# Patient Record
Sex: Female | Born: 1985
Health system: Southern US, Community
[De-identification: ages and names within clinical notes are randomized; demographics above are authoritative.]

## PROBLEM LIST (undated history)

## (undated) DIAGNOSIS — J45909 Unspecified asthma, uncomplicated: Secondary | ICD-10-CM

## (undated) DIAGNOSIS — R7303 Prediabetes: Secondary | ICD-10-CM

## (undated) DIAGNOSIS — F17200 Nicotine dependence, unspecified, uncomplicated: Secondary | ICD-10-CM

## (undated) DIAGNOSIS — M1990 Unspecified osteoarthritis, unspecified site: Secondary | ICD-10-CM

## (undated) DIAGNOSIS — I499 Cardiac arrhythmia, unspecified: Secondary | ICD-10-CM

## (undated) DIAGNOSIS — F32A Depression, unspecified: Secondary | ICD-10-CM

## (undated) DIAGNOSIS — J189 Pneumonia, unspecified organism: Secondary | ICD-10-CM

## (undated) DIAGNOSIS — R000 Tachycardia, unspecified: Secondary | ICD-10-CM

## (undated) DIAGNOSIS — M8700 Idiopathic aseptic necrosis of unspecified bone: Secondary | ICD-10-CM

## (undated) DIAGNOSIS — F329 Major depressive disorder, single episode, unspecified: Secondary | ICD-10-CM

## (undated) DIAGNOSIS — J40 Bronchitis, not specified as acute or chronic: Secondary | ICD-10-CM

## (undated) DIAGNOSIS — F419 Anxiety disorder, unspecified: Secondary | ICD-10-CM

## (undated) HISTORY — PX: OVARIAN CYST SURGERY: SHX726

## (undated) HISTORY — PX: ABDOMINAL HYSTERECTOMY: SHX81

---
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
## 2006-05-26 HISTORY — PX: TOOTH EXTRACTION: SUR596

## 2012-05-26 DIAGNOSIS — J189 Pneumonia, unspecified organism: Secondary | ICD-10-CM

## 2012-05-26 HISTORY — DX: Pneumonia, unspecified organism: J18.9

## 2013-07-02 ENCOUNTER — Emergency Department (HOSPITAL_BASED_OUTPATIENT_CLINIC_OR_DEPARTMENT_OTHER)
Admission: EM | Admit: 2013-07-02 | Discharge: 2013-07-02 | Disposition: A | Discharge status: Home / Self Care | Payer: 59 | Attending: Emergency Medicine | Admitting: Emergency Medicine

## 2013-07-02 ENCOUNTER — Encounter (HOSPITAL_BASED_OUTPATIENT_CLINIC_OR_DEPARTMENT_OTHER): Payer: Self-pay | Admitting: Emergency Medicine

## 2013-07-02 DIAGNOSIS — F17200 Nicotine dependence, unspecified, uncomplicated: Secondary | ICD-10-CM | POA: Insufficient documentation

## 2013-07-02 DIAGNOSIS — R21 Rash and other nonspecific skin eruption: Secondary | ICD-10-CM | POA: Insufficient documentation

## 2013-07-02 DIAGNOSIS — T7840XA Allergy, unspecified, initial encounter: Secondary | ICD-10-CM

## 2013-07-02 DIAGNOSIS — J45909 Unspecified asthma, uncomplicated: Secondary | ICD-10-CM | POA: Insufficient documentation

## 2013-07-02 HISTORY — DX: Bronchitis, not specified as acute or chronic: J40

## 2013-07-02 HISTORY — DX: Unspecified asthma, uncomplicated: J45.909

## 2013-07-02 MED ORDER — FAMOTIDINE IN NACL 20-0.9 MG/50ML-% IV SOLN
20.0000 mg | Freq: Once | INTRAVENOUS | Status: AC
  Administered 2013-07-02: 20 mg via INTRAVENOUS
  Filled 2013-07-02: qty 50

## 2013-07-02 MED ORDER — HYDROXYZINE HCL 25 MG PO TABS: 25.0000 mg | ORAL_TABLET | Freq: Four times a day (QID) | ORAL | Status: DC

## 2013-07-02 MED ORDER — METHYLPREDNISOLONE SODIUM SUCC 125 MG IJ SOLR
125.0000 mg | Freq: Once | INTRAMUSCULAR | Status: AC
  Administered 2013-07-02: 125 mg via INTRAVENOUS
  Filled 2013-07-02: qty 2

## 2013-07-02 MED ORDER — PREDNISONE 20 MG PO TABS: 40.0000 mg | ORAL_TABLET | Freq: Every day | ORAL | Status: DC

## 2013-07-02 MED ORDER — SODIUM CHLORIDE 0.9 % IV BOLUS (SEPSIS)
1000.0000 mL | Freq: Once | INTRAVENOUS | Status: AC
  Administered 2013-07-02: 1000 mL via INTRAVENOUS

## 2013-07-02 MED ORDER — DIPHENHYDRAMINE HCL 50 MG/ML IJ SOLN
25.0000 mg | Freq: Once | INTRAMUSCULAR | Status: AC
  Administered 2013-07-02: 25 mg via INTRAVENOUS
  Filled 2013-07-02: qty 1

## 2013-07-02 NOTE — Discharge Instructions (Signed)
Take prednisone as needed for worsening symptoms. Take Atarax as needed for itching. Refer to attached documents for more information. Return to the ED with worsening or concerning symptoms.

## 2013-07-02 NOTE — ED Notes (Signed)
Pt reports itching "everywhere" x 1-2 hours. Sudden onset. Drank a red powerade prior to sx starting. Itching and bumps noted to arms- denies throat involvement

## 2013-07-02 NOTE — ED Provider Notes (Signed)
CSN: [id number]     Arrival date & time 07/02/13  1858 History   First MD Initiated Contact with Patient 07/02/13 1944     Chief Complaint  Patient presents with  . Allergic Reaction   (Consider location/radiation/quality/duration/timing/severity/associated sxs/prior Treatment) HPI Comments: Patient is a 28 year old female who presents with a rash that started 1 hour prior to arrival. The rash started gradually and progressively worsened since the onset. The rash is located on bilateral arms and chest. Patient has tried nothing without relief. Patient denies new exposures to medications, soaps, lotions, detergent. Patient reports associated occasional itching. No aggravating/alleviating factors. Patient denies fever, chills, NVD, sore throat, oral lesions, ocular involvement, throat closing, wheezing, SOB, chest pain, abdominal pain.      Past Medical History  Diagnosis Date  . Asthma   . Bronchitis    History reviewed. No pertinent past surgical history. No family history on file. History  Substance Use Topics  . Smoking status: Current Every Day Smoker -- 0.50 packs/day    Types: Cigarettes  . Smokeless tobacco: Never Used  . Alcohol Use: 1.8 oz/week    3 Cans of beer per week   OB History   Grav Para Term Preterm Abortions TAB SAB Ect Mult Living                 Review of Systems  Constitutional: Negative for fever, chills and fatigue.  HENT: Negative for trouble swallowing.   Eyes: Negative for visual disturbance.  Respiratory: Negative for shortness of breath.   Cardiovascular: Negative for chest pain and palpitations.  Gastrointestinal: Negative for nausea, vomiting, abdominal pain and diarrhea.  Genitourinary: Negative for dysuria and difficulty urinating.  Musculoskeletal: Negative for arthralgias and neck pain.  Skin: Positive for rash. Negative for color change.  Neurological: Negative for dizziness and weakness.  Psychiatric/Behavioral: Negative for dysphoric  mood.    Allergies  Oxycodone  Home Medications  No current outpatient prescriptions on file. BP 115/70  Pulse 102  Temp(Src) 98.8 F (37.1 C) (Oral)  Resp 18  Ht 5\' 4"  (1.626 m)  Wt 109 lb (49.442 kg)  BMI 18.70 kg/m2  SpO2 100% Physical Exam  Nursing note and vitals reviewed. Constitutional: She is oriented to person, place, and time. She appears well-developed and well-nourished. No distress.  HENT:  Head: Normocephalic and atraumatic.  Eyes: Conjunctivae and EOM are normal.  Neck: Normal range of motion.  Cardiovascular: Normal rate and regular rhythm.  Exam reveals no gallop and no friction rub.   No murmur heard. Pulmonary/Chest: Effort normal and breath sounds normal. She has no wheezes. She has no rales. She exhibits no tenderness.  Musculoskeletal: Normal range of motion.  Neurological: She is alert and oriented to person, place, and time. Coordination normal.  Speech is goal-oriented. Moves limbs without ataxia.   Skin: Skin is warm and dry.  No rash noted on bilateral arms and chest where patient states she is itching.   Psychiatric: She has a normal mood and affect. Her behavior is normal.    ED Course  Procedures (including critical care time) Labs Review Labs Reviewed - No data to display Imaging Review No results found.  EKG Interpretation   None       MDM   1. Allergic reaction     7:51 PM Patient will have solumedrol, pepcid and benadryl for allergic reaction. Patient slightly tachycardic with remaining vitals stable.   9:32 PM Patient reports improvement in her symptoms. I don't see  a rash. Patient is requesting prednisone in case the rash worsens. Patient will also have a prescription for atarax for itching. Patient advised to return to the ED with worsening or concerning symptoms.    Emilia Beck, PA-C 07/02/13 2135

## 2013-07-03 NOTE — ED Provider Notes (Signed)
Medical screening examination/treatment/procedure(s) were performed by non-physician practitioner and as supervising physician I was immediately available for consultation/collaboration.  EKG Interpretation   None         Junius ArgyleForrest S Taeshawn Helfman, MD 07/03/13 1154

## 2013-07-04 ENCOUNTER — Emergency Department (HOSPITAL_BASED_OUTPATIENT_CLINIC_OR_DEPARTMENT_OTHER)
Admission: EM | Admit: 2013-07-04 | Discharge: 2013-07-04 | Disposition: A | Discharge status: Home / Self Care | Payer: 59 | Attending: Emergency Medicine | Admitting: Emergency Medicine

## 2013-07-04 ENCOUNTER — Encounter (HOSPITAL_BASED_OUTPATIENT_CLINIC_OR_DEPARTMENT_OTHER): Payer: Self-pay | Admitting: Emergency Medicine

## 2013-07-04 DIAGNOSIS — F17200 Nicotine dependence, unspecified, uncomplicated: Secondary | ICD-10-CM | POA: Insufficient documentation

## 2013-07-04 DIAGNOSIS — R21 Rash and other nonspecific skin eruption: Secondary | ICD-10-CM | POA: Insufficient documentation

## 2013-07-04 DIAGNOSIS — J45909 Unspecified asthma, uncomplicated: Secondary | ICD-10-CM | POA: Insufficient documentation

## 2013-07-04 DIAGNOSIS — IMO0002 Reserved for concepts with insufficient information to code with codable children: Secondary | ICD-10-CM | POA: Insufficient documentation

## 2013-07-04 DIAGNOSIS — Z3202 Encounter for pregnancy test, result negative: Secondary | ICD-10-CM | POA: Insufficient documentation

## 2013-07-04 LAB — PREGNANCY, URINE: PREG TEST UR: NEGATIVE

## 2013-07-04 MED ORDER — FAMOTIDINE 20 MG PO TABS: 20.0000 mg | ORAL_TABLET | Freq: Two times a day (BID) | ORAL | Status: DC

## 2013-07-04 NOTE — ED Provider Notes (Signed)
CSN: [id number]     Arrival date & time 07/04/13  1358 History   First MD Initiated Contact with Patient 07/04/13 1439     Chief Complaint  Patient presents with  . Rash     (Consider location/radiation/quality/duration/timing/severity/associated sxs/prior Treatment) Patient is a 28 y.o. female presenting with rash. The history is provided by the patient.  Rash Location:  Shoulder/arm and torso Shoulder/arm rash location:  L arm and R arm Torso rash location:  R chest and L chest Associated symptoms: no fever   Associated symptoms comment:  Rash to arms and chest for the past several days. No lip or tongue swelling, or difficulty breathing. She was seen 2 days ago in ED and given prednisone that she is taking, but with no relief.    Past Medical History  Diagnosis Date  . Asthma   . Bronchitis    History reviewed. No pertinent past surgical history. No family history on file. History  Substance Use Topics  . Smoking status: Current Every Day Smoker -- 0.50 packs/day    Types: Cigarettes  . Smokeless tobacco: Never Used  . Alcohol Use: 1.8 oz/week    3 Cans of beer per week   OB History   Grav Para Term Preterm Abortions TAB SAB Ect Mult Living                 Review of Systems  Constitutional: Negative for fever and chills.  HENT: Negative for trouble swallowing.   Respiratory: Negative.   Skin: Positive for rash.  Neurological: Negative.       Allergies  Oxycodone  Home Medications   Current Outpatient Rx  Name  Route  Sig  Dispense  Refill  . hydrOXYzine (ATARAX/VISTARIL) 25 MG tablet   Oral   Take 1 tablet (25 mg total) by mouth every 6 (six) hours.   12 tablet   0   . predniSONE (DELTASONE) 20 MG tablet   Oral   Take 2 tablets (40 mg total) by mouth daily. Take 40 mg by mouth daily for 3 days, then 20mg  by mouth daily for 3 days, then 10mg  daily for 3 days   12 tablet   0    BP 128/77  Pulse 94  Temp(Src) 98.4 F (36.9 C) (Oral)  Resp 16   SpO2 100% Physical Exam  Constitutional: She is oriented to person, place, and time. She appears well-developed and well-nourished.  HENT:  Head: Normocephalic.  Mouth/Throat: Oropharynx is clear and moist.  Neck: Normal range of motion. Neck supple.  Cardiovascular: Normal rate and regular rhythm.   Pulmonary/Chest: Effort normal and breath sounds normal. She has no wheezes. She has no rales.  Abdominal: Soft. Bowel sounds are normal. There is no tenderness. There is no rebound and no guarding.  Musculoskeletal: Normal range of motion.  Neurological: She is alert and oriented to person, place, and time.  Skin: Skin is warm and dry.  Hypopigmented, non-raised rash to forearms. No visualized rash to chest. No extremity swelling or tenderness.   Psychiatric: She has a normal mood and affect.    ED Course  Procedures (including critical care time) Labs Review Labs Reviewed  PREGNANCY, URINE   Imaging Review No results found.  EKG Interpretation   None       MDM   Final diagnoses:  None    1. Rash  ED evaluation x 2 for nonspecific rash not responding to prednisone after 2 days of therapy. Will refer to dermatology for  further management.     Arnoldo HookerShari A Erricka Falkner, PA-C 07/05/13 1214

## 2013-07-04 NOTE — Discharge Instructions (Signed)
CONTINUE YOUR CURRENT MEDICATIONS AND ADD PEPCID. FOLLOW UP WITH DERMATOLOGY FOR FURTHER EVALUATION AND TREATMENT.   Rash A rash is a change in the color or texture of your skin. There are many different types of rashes. You may have other problems that accompany your rash. CAUSES   Infections.  Allergic reactions. This can include allergies to pets or foods.  Certain medicines.  Exposure to certain chemicals, soaps, or cosmetics.  Heat.  Exposure to poisonous plants.  Tumors, both cancerous and noncancerous. SYMPTOMS   Redness.  Scaly skin.  Itchy skin.  Dry or cracked skin.  Bumps.  Blisters.  Pain. DIAGNOSIS  Your caregiver may do a physical exam to determine what type of rash you have. A skin sample (biopsy) may be taken and examined under a microscope. TREATMENT  Treatment depends on the type of rash you have. Your caregiver may prescribe certain medicines. For serious conditions, you may need to see a skin doctor (dermatologist). HOME CARE INSTRUCTIONS   Avoid the substance that caused your rash.  Do not scratch your rash. This can cause infection.  You may take cool baths to help stop itching.  Only take over-the-counter or prescription medicines as directed by your caregiver.  Keep all follow-up appointments as directed by your caregiver. SEEK IMMEDIATE MEDICAL CARE IF:  You have increasing pain, swelling, or redness.  You have a fever.  You have new or severe symptoms.  You have body aches, diarrhea, or vomiting.  Your rash is not better after 3 days. MAKE SURE YOU:  Understand these instructions.  Will watch your condition.  Will get help right away if you are not doing well or get worse. Document Released: 05/02/2002 Document Revised: 08/04/2011 Document Reviewed: 02/24/2011 [facility] Patient Information 2014 [employer]ExitCare, Maryland[employer]C.

## 2013-07-04 NOTE — ED Notes (Signed)
Pt c/o rash, redness and itching to "all over from torso and up". Pt sts she was seen here 2 days ago for same and is taking meds for same.

## 2013-07-05 NOTE — ED Provider Notes (Signed)
Medical screening examination/treatment/procedure(s) were performed by non-physician practitioner and as supervising physician I was immediately available for consultation/collaboration.  EKG Interpretation   None         Angelia Hazell, MD 07/05/13 1516 

## 2013-07-08 ENCOUNTER — Emergency Department (HOSPITAL_BASED_OUTPATIENT_CLINIC_OR_DEPARTMENT_OTHER): Payer: 59

## 2013-07-08 ENCOUNTER — Emergency Department (HOSPITAL_BASED_OUTPATIENT_CLINIC_OR_DEPARTMENT_OTHER)
Admission: EM | Admit: 2013-07-08 | Discharge: 2013-07-08 | Disposition: A | Discharge status: Home / Self Care | Payer: 59 | Attending: Emergency Medicine | Admitting: Emergency Medicine

## 2013-07-08 ENCOUNTER — Encounter (HOSPITAL_BASED_OUTPATIENT_CLINIC_OR_DEPARTMENT_OTHER): Payer: Self-pay | Admitting: Emergency Medicine

## 2013-07-08 DIAGNOSIS — Z3202 Encounter for pregnancy test, result negative: Secondary | ICD-10-CM | POA: Insufficient documentation

## 2013-07-08 DIAGNOSIS — J45909 Unspecified asthma, uncomplicated: Secondary | ICD-10-CM | POA: Insufficient documentation

## 2013-07-08 DIAGNOSIS — R109 Unspecified abdominal pain: Secondary | ICD-10-CM

## 2013-07-08 DIAGNOSIS — F17200 Nicotine dependence, unspecified, uncomplicated: Secondary | ICD-10-CM | POA: Insufficient documentation

## 2013-07-08 DIAGNOSIS — R1084 Generalized abdominal pain: Secondary | ICD-10-CM | POA: Insufficient documentation

## 2013-07-08 DIAGNOSIS — I998 Other disorder of circulatory system: Secondary | ICD-10-CM | POA: Insufficient documentation

## 2013-07-08 DIAGNOSIS — Z79899 Other long term (current) drug therapy: Secondary | ICD-10-CM | POA: Insufficient documentation

## 2013-07-08 LAB — CBC WITH DIFFERENTIAL/PLATELET
Basophils Absolute: 0 10*3/uL (ref 0.0–0.1)
Basophils Relative: 0 % (ref 0–1)
EOS ABS: 0 10*3/uL (ref 0.0–0.7)
Eosinophils Relative: 0 % (ref 0–5)
HCT: 42.7 % (ref 36.0–46.0)
HEMOGLOBIN: 14.5 g/dL (ref 12.0–15.0)
LYMPHS ABS: 1 10*3/uL (ref 0.7–4.0)
Lymphocytes Relative: 6 % — ABNORMAL LOW (ref 12–46)
MCH: 32.3 pg (ref 26.0–34.0)
MCHC: 34 g/dL (ref 30.0–36.0)
MCV: 95.1 fL (ref 78.0–100.0)
MONOS PCT: 2 % — AB (ref 3–12)
Monocytes Absolute: 0.4 10*3/uL (ref 0.1–1.0)
Neutro Abs: 14.7 10*3/uL — ABNORMAL HIGH (ref 1.7–7.7)
Neutrophils Relative %: 91 % — ABNORMAL HIGH (ref 43–77)
Platelets: 227 10*3/uL (ref 150–400)
RBC: 4.49 MIL/uL (ref 3.87–5.11)
RDW: 13.9 % (ref 11.5–15.5)
WBC: 16.1 10*3/uL — ABNORMAL HIGH (ref 4.0–10.5)

## 2013-07-08 LAB — URINALYSIS, ROUTINE W REFLEX MICROSCOPIC
Bilirubin Urine: NEGATIVE
Glucose, UA: NEGATIVE mg/dL
Hgb urine dipstick: NEGATIVE
KETONES UR: NEGATIVE mg/dL
Leukocytes, UA: NEGATIVE
NITRITE: NEGATIVE
PH: 6.5 (ref 5.0–8.0)
Protein, ur: NEGATIVE mg/dL
Specific Gravity, Urine: 1.022 (ref 1.005–1.030)
Urobilinogen, UA: 0.2 mg/dL (ref 0.0–1.0)

## 2013-07-08 LAB — COMPREHENSIVE METABOLIC PANEL
ALT: 13 U/L (ref 0–35)
AST: 15 U/L (ref 0–37)
Albumin: 3.9 g/dL (ref 3.5–5.2)
Alkaline Phosphatase: 63 U/L (ref 39–117)
BUN: 16 mg/dL (ref 6–23)
CALCIUM: 9.3 mg/dL (ref 8.4–10.5)
CO2: 25 mEq/L (ref 19–32)
Chloride: 103 mEq/L (ref 96–112)
Creatinine, Ser: 0.7 mg/dL (ref 0.50–1.10)
GFR calc Af Amer: >90 mL/min (ref >90)
GFR calc non Af Amer: >90 mL/min (ref >90)
GLUCOSE: 120 mg/dL — AB (ref 70–99)
Potassium: 4.3 mEq/L (ref 3.7–5.3)
Sodium: 141 mEq/L (ref 137–147)
TOTAL PROTEIN: 7.1 g/dL (ref 6.0–8.3)
Total Bilirubin: <0.2 mg/dL — ABNORMAL LOW (ref 0.3–1.2)

## 2013-07-08 LAB — PREGNANCY, URINE: Preg Test, Ur: NEGATIVE

## 2013-07-08 LAB — LIPASE, BLOOD: LIPASE: 50 U/L (ref 11–59)

## 2013-07-08 MED ORDER — HYDROCODONE-ACETAMINOPHEN 7.5-325 MG/15ML PO SOLN
10.0000 mL | Freq: Once | ORAL | Status: AC
  Administered 2013-07-08: 10 mL via ORAL
  Filled 2013-07-08: qty 15

## 2013-07-08 MED ORDER — FENTANYL CITRATE 0.05 MG/ML IJ SOLN
50.0000 ug | Freq: Once | INTRAMUSCULAR | Status: AC
  Administered 2013-07-08: 50 ug via INTRAVENOUS
  Filled 2013-07-08: qty 2

## 2013-07-08 MED ORDER — IOHEXOL 300 MG/ML  SOLN
50.0000 mL | Freq: Once | INTRAMUSCULAR | Status: AC | PRN
  Administered 2013-07-08: 50 mL via ORAL

## 2013-07-08 MED ORDER — HYDROCODONE-ACETAMINOPHEN 7.5-325 MG/15ML PO SOLN: 15.0000 mL | Freq: Four times a day (QID) | ORAL | Status: DC | PRN

## 2013-07-08 MED ORDER — IOHEXOL 300 MG/ML  SOLN
100.0000 mL | Freq: Once | INTRAMUSCULAR | Status: AC | PRN
  Administered 2013-07-08: 100 mL via INTRAVENOUS

## 2013-07-08 NOTE — ED Provider Notes (Signed)
CSN: [id number]     Arrival date & time 07/08/13  1226 History   First MD Initiated Contact with Patient 07/08/13 1233     Chief Complaint  Patient presents with  . Abdominal Pain     (Consider location/radiation/quality/duration/timing/severity/associated sxs/prior Treatment) Patient is a 28 y.o. female presenting with abdominal pain. The history is provided by the patient. No language interpreter was used.  Abdominal Pain Pain location:  Generalized Pain quality: aching and sharp   Pain radiates to:  Does not radiate Pain severity:  Moderate Onset quality:  Gradual Duration:  1 week Timing:  Constant Progression:  Worsening Chronicity:  New Context: not diet changes, not medication withdrawal, not previous surgeries, not recent illness, not retching, not sick contacts, not suspicious food intake and not trauma   Relieved by:  Nothing Worsened by:  Nothing tried Ineffective treatments:  None tried Associated symptoms: no anorexia, no chest pain, no chills, no constipation, no cough, no diarrhea, no dysuria, no fatigue, no fever, no hematemesis, no hematochezia, no hematuria, no melena, no nausea, no shortness of breath, no sore throat, no vaginal bleeding, no vaginal discharge and no vomiting   Risk factors: no alcohol abuse, no aspirin use, has not had multiple surgeries, no NSAID use, not obese, not pregnant and no recent hospitalization     Past Medical History  Diagnosis Date  . Asthma   . Bronchitis    History reviewed. No pertinent past surgical history. No family history on file. History  Substance Use Topics  . Smoking status: Current Every Day Smoker -- 0.50 packs/day    Types: Cigarettes  . Smokeless tobacco: Never Used  . Alcohol Use: 1.8 oz/week    3 Cans of beer per week   OB History   Grav Para Term Preterm Abortions TAB SAB Ect Mult Living                 Review of Systems  Constitutional: Negative for fever, chills, diaphoresis, activity change,  appetite change and fatigue.  HENT: Negative for congestion, facial swelling, rhinorrhea and sore throat.   Eyes: Negative for photophobia and discharge.  Respiratory: Negative for cough, chest tightness and shortness of breath.   Cardiovascular: Negative for chest pain, palpitations and leg swelling.  Gastrointestinal: Positive for abdominal pain. Negative for nausea, vomiting, diarrhea, constipation, melena, hematochezia, anorexia and hematemesis.  Endocrine: Negative for polydipsia and polyuria.  Genitourinary: Negative for dysuria, frequency, hematuria, vaginal bleeding, vaginal discharge, difficulty urinating and pelvic pain.  Musculoskeletal: Negative for arthralgias, back pain, neck pain and neck stiffness.  Skin: Negative for color change and wound.  Allergic/Immunologic: Negative for immunocompromised state.  Neurological: Negative for facial asymmetry, weakness, numbness and headaches.  Hematological: Does not bruise/bleed easily.  Psychiatric/Behavioral: Negative for confusion and agitation.      Allergies  Oxycodone  Home Medications   Current Outpatient Rx  Name  Route  Sig  Dispense  Refill  . famotidine (PEPCID) 20 MG tablet   Oral   Take 1 tablet (20 mg total) by mouth 2 (two) times daily.   30 tablet   0   . HYDROcodone-acetaminophen (HYCET) 7.5-325 mg/15 ml solution   Oral   Take 15 mLs by mouth 4 (four) times daily as needed for moderate pain.   150 mL   0   . hydrOXYzine (ATARAX/VISTARIL) 25 MG tablet   Oral   Take 1 tablet (25 mg total) by mouth every 6 (six) hours.   12 tablet  0   . predniSONE (DELTASONE) 20 MG tablet   Oral   Take 2 tablets (40 mg total) by mouth daily. Take 40 mg by mouth daily for 3 days, then 20mg  by mouth daily for 3 days, then 10mg  daily for 3 days   12 tablet   0    BP 121/76  Pulse 77  Temp(Src) 98.5 F (36.9 C) (Oral)  Resp 16  Ht 5' 3.75" (1.619 m)  Wt 109 lb 4 oz (49.555 kg)  BMI 18.91 kg/m2  SpO2 98%  LMP  06/26/2013 Physical Exam  Constitutional: She is oriented to person, place, and time. She appears well-developed and well-nourished. No distress.  HENT:  Head: Normocephalic and atraumatic.  Mouth/Throat: No oropharyngeal exudate.  Eyes: Pupils are equal, round, and reactive to light.  Neck: Normal range of motion. Neck supple.  Cardiovascular: Normal rate, regular rhythm and normal heart sounds.  Exam reveals no gallop and no friction rub.   No murmur heard. Pulmonary/Chest: Effort normal and breath sounds normal. No respiratory distress. She has no wheezes. She has no rales.  Abdominal: Soft. Bowel sounds are normal. She exhibits no distension and no mass. There is no tenderness. There is no rebound and no guarding.  Musculoskeletal: Normal range of motion. She exhibits no edema and no tenderness.  Several small, light colored ecchymosis to BLLE  Neurological: She is alert and oriented to person, place, and time.  Skin: Skin is warm and dry.  Psychiatric: She has a normal mood and affect.    ED Course  Procedures (including critical care time) Labs Review Labs Reviewed  URINALYSIS, ROUTINE W REFLEX MICROSCOPIC - Abnormal; Notable for the following:    APPearance CLOUDY (*)    All other components within normal limits  CBC WITH DIFFERENTIAL - Abnormal; Notable for the following:    WBC 16.1 (*)    Neutrophils Relative % 91 (*)    Neutro Abs 14.7 (*)    Lymphocytes Relative 6 (*)    Monocytes Relative 2 (*)    All other components within normal limits  COMPREHENSIVE METABOLIC PANEL - Abnormal; Notable for the following:    Glucose, Bld 120 (*)    Total Bilirubin <0.2 (*)    All other components within normal limits  PREGNANCY, URINE  LIPASE, BLOOD   Imaging Review Ct Abdomen Pelvis W Contrast  07/08/2013   CLINICAL DATA:  Abdominal pain.  EXAM: CT ABDOMEN AND PELVIS WITH CONTRAST  TECHNIQUE: Multidetector CT imaging of the abdomen and pelvis was performed using the standard  protocol following bolus administration of intravenous contrast.  CONTRAST:  50mL OMNIPAQUE IOHEXOL 300 MG/ML SOLN, OMNIPAQUE IOHEXOL 300 MG/ML SOLN  COMPARISON:  None.  FINDINGS: Visualized lung bases appear normal. No osseous abnormality is noted.  The liver, spleen and pancreas appear normal. No gallstones are noted. Adrenal glands appear normal. No hydronephrosis or renal obstruction is noted. No renal or ureteral calculi are noted. The appendix appears normal. There is no evidence of bowel obstruction. Urinary bladder appears normal. Uterus appears normal. No abnormal fluid collection is noted. 2.8 cm left ovarian cyst is noted. 2.2 cm right ovarian cyst is noted. No definite adenopathy is noted.  IMPRESSION: Bilateral ovarian cysts are noted which most likely are physiologic. No other significant abnormality seen in the abdomen or pelvis.   Electronically Signed   By: Roque Lias M.D.   On: 07/08/2013 17:26    EKG Interpretation   None  MDM   Final diagnoses:  Abdominal pain    Pt is a 28 y.o. female with Pmhx as above who presents with about 1 week of pain that started in mid back, now also with generalized abdominal pain. No fever, chills, n/v, d/a, urinary symptoms, vaginal bleeding or d/c.  She reports inc brusing in LE.  She also reports this is 3rd visit for same, but it appears other two visits were for a rash which has since resolved.  She has a brother who developed atypical HUS about 1 yr ago and developed ESRD. On PE, VSS, pt in NAD.  +generalized ttp of mid to low back and upper abdomen w/o rebound or guarding.  She has several small, light bruises to BLLE.  No other rash including, petechia or purpura.  W/u showed elevated WBC w/ left shift. Hb, Cr, lytes nml.  Doubt HUS.  Pt not improved after PO vidocin.  Given high WBC, CP ab/pelvis ordered.  Care transferred to Dr. Loretha Stapler w/ plan to review scan.   Shanna Cisco, MD 07/08/13 2211

## 2013-07-08 NOTE — ED Provider Notes (Signed)
Assumed from Dr. Park Popeockerty.  CT scan has resulted and is negative. Repeat abdominal exam shows tenderness in epigastrium without other tenderness and without rebound rigidity or guarding.  Source of patient's pain is unclear, so have advised close followup and have given strict return precautions.   Clinical Impression: 1. Abdominal pain       Diane ChurnJohn David Thressa Shiffer III, MD 07/08/13 [phone]

## 2013-07-08 NOTE — Discharge Instructions (Signed)
Abdominal Pain, Women °Abdominal (stomach, pelvic, or belly) pain can be caused by many things. It is important to tell your doctor: °· The location of the pain. °· Does it come and go or is it present all the time? °· Are there things that start the pain (eating certain foods, exercise)? °· Are there other symptoms associated with the pain (fever, nausea, vomiting, diarrhea)? °All of this is helpful to know when trying to find the cause of the pain. °CAUSES  °· Stomach: virus or bacteria infection, or ulcer. °· Intestine: appendicitis (inflamed appendix), regional ileitis (Crohn's disease), ulcerative colitis (inflamed colon), irritable bowel syndrome, diverticulitis (inflamed diverticulum of the colon), or cancer of the stomach or intestine. °· Gallbladder disease or stones in the gallbladder. °· Kidney disease, kidney stones, or infection. °· Pancreas infection or cancer. °· Fibromyalgia (pain disorder). °· Diseases of the female organs: °· Uterus: fibroid (non-cancerous) tumors or infection. °· Fallopian tubes: infection or tubal pregnancy. °· Ovary: cysts or tumors. °· Pelvic adhesions (scar tissue). °· Endometriosis (uterus lining tissue growing in the pelvis and on the pelvic organs). °· Pelvic congestion syndrome (female organs filling up with blood just before the menstrual period). °· Pain with the menstrual period. °· Pain with ovulation (producing an egg). °· Pain with an IUD (intrauterine device, birth control) in the uterus. °· Cancer of the female organs. °· Functional pain (pain not caused by a disease, may improve without treatment). °· Psychological pain. °· Depression. °DIAGNOSIS  °Your doctor will decide the seriousness of your pain by doing an examination. °· Blood tests. °· X-rays. °· Ultrasound. °· CT scan (computed tomography, special type of X-ray). °· MRI (magnetic resonance imaging). °· Cultures, for infection. °· Barium enema (dye inserted in the large intestine, to better view it with  X-rays). °· Colonoscopy (looking in intestine with a lighted tube). °· Laparoscopy (minor surgery, looking in abdomen with a lighted tube). °· Major abdominal exploratory surgery (looking in abdomen with a large incision). °TREATMENT  °The treatment will depend on the cause of the pain.  °· Many cases can be observed and treated at home. °· Over-the-counter medicines recommended by your caregiver. °· Prescription medicine. °· Antibiotics, for infection. °· Birth control pills, for painful periods or for ovulation pain. °· Hormone treatment, for endometriosis. °· Nerve blocking injections. °· Physical therapy. °· Antidepressants. °· Counseling with a psychologist or psychiatrist. °· Minor or major surgery. °HOME CARE INSTRUCTIONS  °· Do not take laxatives, unless directed by your caregiver. °· Take over-the-counter pain medicine only if ordered by your caregiver. Do not take aspirin because it can cause an upset stomach or bleeding. °· Try a clear liquid diet (broth or water) as ordered by your caregiver. Slowly move to a bland diet, as tolerated, if the pain is related to the stomach or intestine. °· Have a thermometer and take your temperature several times a day, and record it. °· Bed rest and sleep, if it helps the pain. °· Avoid sexual intercourse, if it causes pain. °· Avoid stressful situations. °· Keep your follow-up appointments and tests, as your caregiver orders. °· If the pain does not go away with medicine or surgery, you may try: °· Acupuncture. °· Relaxation exercises (yoga, meditation). °· Group therapy. °· Counseling. °SEEK MEDICAL CARE IF:  °· You notice certain foods cause stomach pain. °· Your home care treatment is not helping your pain. °· You need stronger pain medicine. °· You want your IUD removed. °· You feel faint or   lightheaded. °· You develop nausea and vomiting. °· You develop a rash. °· You are having side effects or an allergy to your medicine. °SEEK IMMEDIATE MEDICAL CARE IF:  °· Your  pain does not go away or gets worse. °· You have a fever. °· Your pain is felt only in portions of the abdomen. The right side could possibly be appendicitis. The left lower portion of the abdomen could be colitis or diverticulitis. °· You are passing blood in your stools (bright red or black tarry stools, with or without vomiting). °· You have blood in your urine. °· You develop chills, with or without a fever. °· You pass out. °MAKE SURE YOU:  °· Understand these instructions. °· Will watch your condition. °· Will get help right away if you are not doing well or get worse. °Document Released: 03/09/2007 Document Revised: 08/04/2011 Document Reviewed: 03/29/2009 °[employer]® Patient Information ©2014 [employer]. ° °

## 2013-07-08 NOTE — ED Notes (Signed)
Pt moved to hallbed to wait for her ride to arrive.  Will be DC'd when he gets here.

## 2013-07-08 NOTE — ED Notes (Signed)
Pt has called for ride. Should be about 20 min before they arrive.

## 2013-07-08 NOTE — ED Notes (Signed)
Abdominal and back pain for a week. States this is her 3rd visit for the same pain. No cause for the pain has been determined.

## 2013-07-10 ENCOUNTER — Emergency Department (HOSPITAL_BASED_OUTPATIENT_CLINIC_OR_DEPARTMENT_OTHER)
Admission: EM | Admit: 2013-07-10 | Discharge: 2013-07-10 | Disposition: A | Discharge status: Home / Self Care | Payer: 59 | Attending: Emergency Medicine | Admitting: Emergency Medicine

## 2013-07-10 ENCOUNTER — Encounter (HOSPITAL_BASED_OUTPATIENT_CLINIC_OR_DEPARTMENT_OTHER): Payer: Self-pay | Admitting: Emergency Medicine

## 2013-07-10 DIAGNOSIS — R110 Nausea: Secondary | ICD-10-CM | POA: Insufficient documentation

## 2013-07-10 DIAGNOSIS — M549 Dorsalgia, unspecified: Secondary | ICD-10-CM | POA: Insufficient documentation

## 2013-07-10 DIAGNOSIS — R109 Unspecified abdominal pain: Secondary | ICD-10-CM | POA: Insufficient documentation

## 2013-07-10 DIAGNOSIS — M542 Cervicalgia: Secondary | ICD-10-CM | POA: Insufficient documentation

## 2013-07-10 DIAGNOSIS — R61 Generalized hyperhidrosis: Secondary | ICD-10-CM | POA: Insufficient documentation

## 2013-07-10 DIAGNOSIS — F17200 Nicotine dependence, unspecified, uncomplicated: Secondary | ICD-10-CM | POA: Insufficient documentation

## 2013-07-10 DIAGNOSIS — J45909 Unspecified asthma, uncomplicated: Secondary | ICD-10-CM | POA: Insufficient documentation

## 2013-07-10 DIAGNOSIS — Z79899 Other long term (current) drug therapy: Secondary | ICD-10-CM | POA: Insufficient documentation

## 2013-07-10 LAB — CBC WITH DIFFERENTIAL/PLATELET
Basophils Absolute: 0 10*3/uL (ref 0.0–0.1)
Basophils Relative: 0 % (ref 0–1)
EOS ABS: 0 10*3/uL (ref 0.0–0.7)
EOS PCT: 0 % (ref 0–5)
HCT: 43.1 % (ref 36.0–46.0)
Hemoglobin: 14.6 g/dL (ref 12.0–15.0)
LYMPHS ABS: 1.2 10*3/uL (ref 0.7–4.0)
Lymphocytes Relative: 7 % — ABNORMAL LOW (ref 12–46)
MCH: 32.2 pg (ref 26.0–34.0)
MCHC: 33.9 g/dL (ref 30.0–36.0)
MCV: 95.1 fL (ref 78.0–100.0)
Monocytes Absolute: 0.6 10*3/uL (ref 0.1–1.0)
Monocytes Relative: 3 % (ref 3–12)
Neutro Abs: 15.3 10*3/uL — ABNORMAL HIGH (ref 1.7–7.7)
Neutrophils Relative %: 90 % — ABNORMAL HIGH (ref 43–77)
PLATELETS: 262 10*3/uL (ref 150–400)
RBC: 4.53 MIL/uL (ref 3.87–5.11)
RDW: 14.5 % (ref 11.5–15.5)
WBC: 17.1 10*3/uL — ABNORMAL HIGH (ref 4.0–10.5)

## 2013-07-10 LAB — URINALYSIS, ROUTINE W REFLEX MICROSCOPIC
BILIRUBIN URINE: NEGATIVE
GLUCOSE, UA: NEGATIVE mg/dL
HGB URINE DIPSTICK: NEGATIVE
Ketones, ur: NEGATIVE mg/dL
LEUKOCYTES UA: NEGATIVE
Nitrite: NEGATIVE
PROTEIN: NEGATIVE mg/dL
Specific Gravity, Urine: 1.01 (ref 1.005–1.030)
Urobilinogen, UA: 0.2 mg/dL (ref 0.0–1.0)
pH: 7 (ref 5.0–8.0)

## 2013-07-10 LAB — COMPREHENSIVE METABOLIC PANEL
ALBUMIN: 3.7 g/dL (ref 3.5–5.2)
ALK PHOS: 65 U/L (ref 39–117)
ALT: 11 U/L (ref 0–35)
AST: 13 U/L (ref 0–37)
BUN: 9 mg/dL (ref 6–23)
CHLORIDE: 99 meq/L (ref 96–112)
CO2: 26 mEq/L (ref 19–32)
Calcium: 9.2 mg/dL (ref 8.4–10.5)
Creatinine, Ser: 0.6 mg/dL (ref 0.50–1.10)
GFR calc Af Amer: >90 mL/min (ref >90)
GFR calc non Af Amer: >90 mL/min (ref >90)
Glucose, Bld: 98 mg/dL (ref 70–99)
POTASSIUM: 4.5 meq/L (ref 3.7–5.3)
Sodium: 137 mEq/L (ref 137–147)
Total Protein: 6.8 g/dL (ref 6.0–8.3)

## 2013-07-10 LAB — LIPASE, BLOOD: Lipase: 34 U/L (ref 11–59)

## 2013-07-10 MED ORDER — HYDROMORPHONE HCL PF 2 MG/ML IJ SOLN
2.0000 mg | Freq: Once | INTRAMUSCULAR | Status: AC
  Administered 2013-07-10: 2 mg via INTRAMUSCULAR
  Filled 2013-07-10: qty 1

## 2013-07-10 MED ORDER — HYDROCODONE-ACETAMINOPHEN 7.5-325 MG/15ML PO SOLN
15.0000 mL | Freq: Four times a day (QID) | ORAL | Status: DC | PRN
Start: 2013-07-10 — End: 2013-11-28

## 2013-07-10 NOTE — ED Notes (Signed)
Reports continued abdominal pain x five days.  Evaluated here on the 13th and sent home.  Also feels like her abdomen is swelling.  Denies dysuria, vomiting, diarrhea.  Reports nausea from taking the pain medication (which she states is not working).  No fever reported.

## 2013-07-10 NOTE — Discharge Instructions (Signed)
Return for any newer worse symptoms. The liquid hydrocodone was a renewed that this would last time we can renew it. Referral to GYN provided also resource guide below provided to help you find a regular doctor. Per GYN call women's hospital at [phone].   Emergency Department Resource Guide 1) Find a Doctor and Pay Out of Pocket Although you won't have to find out who is covered by your insurance plan, it is a good idea to ask around and get recommendations. You will then need to call the office and see if the doctor you have chosen will accept you as a new patient and what types of options they offer for patients who are self-pay. Some doctors offer discounts or will set up payment plans for their patients who do not have insurance, but you will need to ask so you aren't surprised when you get to your appointment.  2) Contact Your Local Health Department Not all health departments have doctors that can see patients for sick visits, but many do, so it is worth a call to see if yours does. If you don't know where your local health department is, you can check in your phone book. The CDC also has a tool to help you locate your state's health department, and many state websites also have listings of all of their local health departments.  3) Find a Walk-in Clinic If your illness is not likely to be very severe or complicated, you may want to try a walk in clinic. These are popping up all over the country in pharmacies, drugstores, and shopping centers. They're usually staffed by nurse practitioners or physician assistants that have been trained to treat common illnesses and complaints. They're usually fairly quick and inexpensive. However, if you have serious medical issues or chronic medical problems, these are probably not your best option.  No Primary Care Doctor: - Call Health Connect at  [phone] - they can help you locate a primary care doctor that  accepts your insurance, provides certain  services, etc. - Physician Referral Service- [phone]  Chronic Pain Problems: Organization         Address  Phone   Notes  [facility]  [phone] Patients need to be referred by their primary care doctor.   Medication Assistance: Organization         Address  Phone   Notes  [facility] [address] [place], [place] [address] [phone] --Must be a resident of [facility] -- Must have NO insurance coverage whatsoever (no Medicaid/ Medicare, etc.) -- The pt. MUST have a primary care doctor that directs their care regularly and follows them in the community   MedAssist  [phone]   [employer]  [phone]    Agencies that provide inexpensive medical care: Organization         Address  Phone   Notes  [facility]  [phone]   [facility]    [phone]) [phone]   [facility] [address] [place], [place] [address] [phone]   [facility] [address], [place] [phone]) [phone]   Planned Parenthood    [phone]   [facility]    [phone]) [phone]   [facility]  [address], [place] Phone:  [phone], Fax:  [phone]) [phone] Hours of Operation:  9 am - 6 pm, M-F.  Also accepts Medicaid/Medicare and self-pay.  [facility] for [place], [address], [place] Phone: [phone], Fax: [phone]. Hours of Operation:  8:30 am - 5:30 pm, M-F.  Also accepts Medicaid and self-pay.  [facility] [address] [place] Phone: [phone]   [place], [place], [place] [phone], Ext. [phone] Mondays & Thursdays: 7-9 AM.  First 15 patients are seen on a first come, first serve basis.    [place] Providers:  Organization         Address  Phone   Notes  [facility] [address], [place] [phone] Also accepts self-pay patients.  [facility] [address] [place], [place]  [phone]   [place], [address], [place] [phone]   [facility] [address], [place] [phone]   Lucianne Lei [address], [place]   [phone] Only accepts [place] Access [place] patients after they have their name applied to their card.   Self-Pay (no insurance) in [facility] Xxx:  Organization         Address  Phone   Notes  Sickle Cell Patients, [facility] [place] [phone]   [facility] [place] [phone]   [facility] [place]  [place], [place], [place] [phone]   [facility]/Dr. Osei-Bonsu  [address] [place] or [address], [place] [phone] Phone number for both [place] and [place] locations is the same.  Urgent Medical and [facility] [address], [place] [phone]   [facility] [address] [place] or [address] [phone]   [facility] [address] [place] [phone], phone; [phone], fax Sees patients 1st and 3rd Saturday of every month.  Must not qualify for public or private insurance (i.e. Medicaid, Medicare, [place] Health Choice, Veterans' Benefits)  Household income should be no more than 200% of the poverty level The clinic cannot treat you if you are pregnant or think you are pregnant  Sexually transmitted diseases are not treated at the clinic.    Dental Care: Organization         Address  Phone  Notes  [facility] of [facility] [place] [phone] Accepts  children up to age 57 who are enrolled in [place] or [place]; pregnant women with a Medicaid card; and children who have applied for Medicaid or [place] Health Choice, but were declined, whose parents can pay a reduced fee at time of service.  [facility] of [facility]  [address], [place] [phone] Accepts children up to age 80 who are enrolled in [place] or [place]; pregnant women with a Medicaid card; and children who have applied for Medicaid or [place] Health Choice, but were declined, whose parents can pay a reduced fee at time of service.  [facility]  [place] [phone] Patients are seen by appointment only. Walk-ins are not accepted. [place] will see patients 42 years of age and older. Monday - Tuesday (  8am-5pm) Most Wednesdays (8:30-5pm) $30 per visit, cash only  [facility] Access PRO[facility]  [address], [facility] [phone] Patients are seen by appointment only. Walk-ins are not accepted. [place] will see patients 78 years of age and older. One Wednesday Evening (Monthly: Volunteer Based).  $30 per visit, cash only  [place]  [phone] for adults; Children under age 55, call Graduate Pediatric Dentistry at [phone]. Children aged 17-14, please call [phone] to request a pediatric application.  Dental services are provided in all areas of dental care including fillings, crowns and bridges, complete and partial dentures, implants, gum treatment, root canals, and extractions. Preventive care is also provided. Treatment is provided to both adults and children. Patients are selected via a lottery and there is often a waiting list.   [facility] [address], [place]  [phone] [email or web address]   [facility] [address] [place], [place] [phone], Ext. [phone] Second and  Fourth Thursday of each month, opens at 6:30 AM; Clinic ends at 9 AM.  Patients are seen on a first-come first-served basis, and a limited number are seen during each clinic.   [facility]  [address] Hillard Danker [place], [place] [phone]   Eligibility Requirements You must have lived in [place], [place], or [place] counties for at least the last three months.   You cannot be eligible for state or federal sponsored [employer], including [employer], [place], or [employer].   You generally cannot be eligible for healthcare insurance through your employer.    How to apply: Eligibility screenings are held every Tuesday and Wednesday afternoon from 1:00 pm until 4:00 pm. You do not need an appointment for the interview!  [facility] [address] [place], [place]   Head of the [place]  [place] Department  [place]  [phone]    Behavioral Health Resources in the Community: Intensive Outpatient Programs Organization         Address  Phone  Notes  [place]. [address] [place], [place] [phone]   [facility] [address], [place], [place]   ADS: Alcohol & Drug Svcs [address] [place], [place]   [place] [address],  [place], [place] or [phone]   Substance Abuse Resources Organization         Address  Phone  Notes  Alcohol and Drug Services  [phone]   [place]  [phone]   [facility] [place]   Chinita Pester  [phone]   Residential & Outpatient Substance Abuse Program  [phone]   Psychological Services Organization         Address  Phone  Notes  [address] [facility] [place]  [place]  [phone]   [place]  [address], [place] or [phone]    Mobile Crisis Teams Organization         Address  Phone  Notes  [facility], Mobile Crisis Care Unit  [phone]   [facility]ive Psychotherapeutic Services  [address] [place], [place]   [place] Levels [address], [place] [phone]    Self-Help/Support Groups Organization         Address  Phone  Notes  Mental Health Assoc. of [place] - variety of support groups  [place] Call for more information  Narcotics Anonymous (NA), [facility] [address], [employer] [place]  2 meetings at this location   Special educational needs teacher         Address  Phone  Notes  [facility]AP Residential Treatment [place],    [place]  [phone]   [facility]  [address] [place] [address]2831, [place], [place]   [place], [place] [phone] Admissions: 8am-3pm M-F  Incentives Substance [place] [address]    [place], [place] [phone]   [facility] [address] [place], [place], [place]   [facility] [address],  [place], [place]   [facility] - Intensive Outpatient [address] Kristeen Mans [address], [place], [place]   [facility] [place]  [place], [place] [phone] or [phone]   [facility] Services (RTS) [address] [place], [place] Accepts Medicaid  [facility] [address],  [place] [phone] Substance Abuse/Addiction Treatment   [facility] Organization         Address  Phone  Notes  [facility]  [phone]   Domenic Schwab, PhD [address] Arlis Porta [place], [place]   [phone] or [phone]   [place], [place] [phone]   [facility] [address] [place], [place] [phone] Insurance/Medicaid/sponsorship through [facility] and Families [address], [address] [place]                                    [place], [place] [phone] [place] [address], [place] [phone]    Dr. Adele Schilder  [phone]   [facility] Dept. 1) [address] [place] 2) [place] 3)  [place] 65, [place] [phone]  [phone]   [place] [phone] or [phone] (After Hours)

## 2013-07-10 NOTE — ED Provider Notes (Signed)
CSN: [id number]     Arrival date & time 07/10/13  1313 History   First MD Initiated Contact with Patient 07/10/13 1509     This chart was scribed for Shelda Jakes, MD by Arlan Organ, ED Scribe. This patient was seen in room MH01/MH01 and the patient's care was started 3:31 PM.   Chief Complaint  Patient presents with  . Abdominal Pain   Patient is a 28 y.o. female presenting with abdominal pain. The history is provided by the patient. No language interpreter was used.  Abdominal Pain Pain location:  LUQ, RUQ, RLQ and LLQ Pain quality: aching and sharp   Pain radiates to:  Back Pain severity:  Severe Onset quality:  Gradual Timing:  Constant Progression:  Worsening Chronicity:  New Relieved by:  Nothing Worsened by:  Nothing tried Ineffective treatments:  None tried Associated symptoms: nausea   Associated symptoms: no chest pain, no chills, no cough, no diarrhea, no fever, no sore throat and no vomiting     HPI Comments: Diane Stein is a 28 y.o. Female with a PMHx of asthma who presents to the Emergency Department complaining of gradual onset, ongoing, constant diffuse upper and lower quadrant abdominal pain that radiates to the back described as "menstraul cramps" and "soreness" with associated "swelling feeling" rated 8.5/10 currently that initially started 5 days ago. Pt was recently seen 07/08/13 for the same complaint and was treated with liquid Vicodin. She states during her initial visit on 2/13 her pain was isolated to the upper quadrants, but has now moved throughout her whole abdomen. She denies any similar pain in the past. Pt reports currently being on Prednisone and Pepcid. Pt denies currently being on any blood thinners. At this time she denies any dysuria, vomiting, fever, chills, rhinorrhea, sore throat, visual changes, CP, leg swelling, rash, HA, or diarrhea. LNMP 06/26/13.  Pt is not followed by a PCP at this time.  Past Medical History  Diagnosis Date  .  Asthma   . Bronchitis    History reviewed. No pertinent past surgical history. No family history on file. History  Substance Use Topics  . Smoking status: Current Every Day Smoker -- 0.50 packs/day    Types: Cigarettes  . Smokeless tobacco: Never Used  . Alcohol Use: 1.8 oz/week    3 Cans of beer per week   OB History   Grav Para Term Preterm Abortions TAB SAB Ect Mult Living                 Review of Systems  Constitutional: Positive for diaphoresis. Negative for fever and chills.  HENT: Negative for congestion, rhinorrhea and sore throat.   Eyes: Negative for visual disturbance.  Respiratory: Negative for cough.   Cardiovascular: Negative for chest pain and leg swelling.  Gastrointestinal: Positive for nausea and abdominal pain. Negative for vomiting and diarrhea.  Musculoskeletal: Positive for back pain and neck pain. Negative for joint swelling.  Skin: Negative for rash.  Neurological: Negative for headaches.  Psychiatric/Behavioral: Negative for confusion.      Allergies  Oxycodone  Home Medications   Current Outpatient Rx  Name  Route  Sig  Dispense  Refill  . famotidine (PEPCID) 20 MG tablet   Oral   Take 1 tablet (20 mg total) by mouth 2 (two) times daily.   30 tablet   0   . HYDROcodone-acetaminophen (HYCET) 7.5-325 mg/15 ml solution   Oral   Take 15 mLs by mouth 4 (four) times daily as  needed for moderate pain.   150 mL   0   . HYDROcodone-acetaminophen (HYCET) 7.5-325 mg/15 ml solution   Oral   Take 15 mLs by mouth 4 (four) times daily as needed for moderate pain.   120 mL   0   . hydrOXYzine (ATARAX/VISTARIL) 25 MG tablet   Oral   Take 1 tablet (25 mg total) by mouth every 6 (six) hours.   12 tablet   0   . predniSONE (DELTASONE) 20 MG tablet   Oral   Take 2 tablets (40 mg total) by mouth daily. Take 40 mg by mouth daily for 3 days, then 20mg  by mouth daily for 3 days, then 10mg  daily for 3 days   12 tablet   0    Triage Vitals: BP  125/71  Pulse 106  Temp(Src) 98.3 F (36.8 C) (Oral)  Resp 16  Ht 5\' 3"  (1.6 m)  Wt 109 lb 6.4 oz (49.624 kg)  BMI 19.38 kg/m2  SpO2 100%  LMP 06/26/2013  Physical Exam  Nursing note and vitals reviewed. Constitutional: She is oriented to person, place, and time. She appears well-developed and well-nourished.  HENT:  Head: Normocephalic and atraumatic.  Eyes: EOM are normal.  Neck: Normal range of motion.  Cardiovascular: Normal rate, regular rhythm and normal heart sounds.   Pulmonary/Chest: Effort normal and breath sounds normal.  Lungs clear bilaterally  Abdominal: Soft. Bowel sounds are normal. There is tenderness. There is no guarding.  Tenderness to upper and lower quadrants without any gaurding  Musculoskeletal: Normal range of motion.  Neurological: She is alert and oriented to person, place, and time.  Skin: Skin is warm and dry.  Psychiatric: She has a normal mood and affect. Her behavior is normal.    ED Course  Procedures (including critical care time)  DIAGNOSTIC STUDIES: Oxygen Saturation is 100% on RA, Normal by my interpretation.    COORDINATION OF CARE: 3:40 PM-Will give dilaudid. Will order CBC, urinalysis, lipase, and metabolic panel. Discussed treatment plan with pt at bedside and pt agreed to plan.     Labs Review Labs Reviewed  COMPREHENSIVE METABOLIC PANEL - Abnormal; Notable for the following:    Total Bilirubin <0.2 (*)    All other components within normal limits  CBC WITH DIFFERENTIAL - Abnormal; Notable for the following:    WBC 17.1 (*)    Neutrophils Relative % 90 (*)    Neutro Abs 15.3 (*)    Lymphocytes Relative 7 (*)    All other components within normal limits  URINALYSIS, ROUTINE W REFLEX MICROSCOPIC - Abnormal; Notable for the following:    APPearance CLOUDY (*)    All other components within normal limits  LIPASE, BLOOD   Results for orders placed during the hospital encounter of 07/10/13  COMPREHENSIVE METABOLIC PANEL       Result Value Ref Range   Sodium 137  137 - 147 mEq/L   Potassium 4.5  3.7 - 5.3 mEq/L   Chloride 99  96 - 112 mEq/L   CO2 26  19 - 32 mEq/L   Glucose, Bld 98  70 - 99 mg/dL   BUN 9  6 - 23 mg/dL   Creatinine, Ser 1.61  0.50 - 1.10 mg/dL   Calcium 9.2  8.4 - 09.6 mg/dL   Total Protein 6.8  6.0 - 8.3 g/dL   Albumin 3.7  3.5 - 5.2 g/dL   AST 13  0 - 37 U/L   ALT 11  0 -  35 U/L   Alkaline Phosphatase 65  39 - 117 U/L   Total Bilirubin <0.2 (*) 0.3 - 1.2 mg/dL   GFR calc non Af Amer >90  >90 mL/min   GFR calc Af Amer >90  >90 mL/min  CBC WITH DIFFERENTIAL      Result Value Ref Range   WBC 17.1 (*) 4.0 - 10.5 K/uL   RBC 4.53  3.87 - 5.11 MIL/uL   Hemoglobin 14.6  12.0 - 15.0 g/dL   HCT 36.643.1  44.036.0 - 34.746.0 %   MCV 95.1  78.0 - 100.0 fL   MCH 32.2  26.0 - 34.0 pg   MCHC 33.9  30.0 - 36.0 g/dL   RDW 42.514.5  95.611.5 - 38.715.5 %   Platelets 262  150 - 400 K/uL   Neutrophils Relative % 90 (*) 43 - 77 %   Neutro Abs 15.3 (*) 1.7 - 7.7 K/uL   Lymphocytes Relative 7 (*) 12 - 46 %   Lymphs Abs 1.2  0.7 - 4.0 K/uL   Monocytes Relative 3  3 - 12 %   Monocytes Absolute 0.6  0.1 - 1.0 K/uL   Eosinophils Relative 0  0 - 5 %   Eosinophils Absolute 0.0  0.0 - 0.7 K/uL   Basophils Relative 0  0 - 1 %   Basophils Absolute 0.0  0.0 - 0.1 K/uL  URINALYSIS, ROUTINE W REFLEX MICROSCOPIC      Result Value Ref Range   Color, Urine YELLOW  YELLOW   APPearance CLOUDY (*) CLEAR   Specific Gravity, Urine 1.010  1.005 - 1.030   pH 7.0  5.0 - 8.0   Glucose, UA NEGATIVE  NEGATIVE mg/dL   Hgb urine dipstick NEGATIVE  NEGATIVE   Bilirubin Urine NEGATIVE  NEGATIVE   Ketones, ur NEGATIVE  NEGATIVE mg/dL   Protein, ur NEGATIVE  NEGATIVE mg/dL   Urobilinogen, UA 0.2  0.0 - 1.0 mg/dL   Nitrite NEGATIVE  NEGATIVE   Leukocytes, UA NEGATIVE  NEGATIVE  LIPASE, BLOOD      Result Value Ref Range   Lipase 34  11 - 59 U/L    Imaging Review No results found.  EKG Interpretation   None       MDM   Final  diagnoses:  Abdominal pain    Patient's previous visit reviewed. CT scan without any acute findings. Today's labs without any significant change compared from those on the 13th. Still with a leukocytosis. No evidence of urinary tract infection. Patient's abdomen is soft no significant tenderness nonsurgical. Patient given GYN referral and resource guide to help find a primary care Dr. Maryclare LabradorWe'll renew her liquid hydrocodone. Patient informed that this is the last time that we can do that.    I personally performed the services described in this documentation, which was scribed in my presence. The recorded information has been reviewed and is accurate.    Shelda JakesScott W. Rhoda Waldvogel, MD 07/10/13 [phone]

## 2013-07-28 ENCOUNTER — Emergency Department (HOSPITAL_BASED_OUTPATIENT_CLINIC_OR_DEPARTMENT_OTHER)
Admission: EM | Admit: 2013-07-28 | Discharge: 2013-07-28 | Disposition: A | Discharge status: Home / Self Care | Payer: 59 | Attending: Emergency Medicine | Admitting: Emergency Medicine

## 2013-07-28 ENCOUNTER — Encounter (HOSPITAL_BASED_OUTPATIENT_CLINIC_OR_DEPARTMENT_OTHER): Payer: Self-pay | Admitting: Emergency Medicine

## 2013-07-28 ENCOUNTER — Emergency Department (HOSPITAL_BASED_OUTPATIENT_CLINIC_OR_DEPARTMENT_OTHER): Payer: 59

## 2013-07-28 DIAGNOSIS — Y990 Civilian activity done for income or pay: Secondary | ICD-10-CM | POA: Insufficient documentation

## 2013-07-28 DIAGNOSIS — X58XXXA Exposure to other specified factors, initial encounter: Secondary | ICD-10-CM

## 2013-07-28 DIAGNOSIS — J45909 Unspecified asthma, uncomplicated: Secondary | ICD-10-CM | POA: Insufficient documentation

## 2013-07-28 DIAGNOSIS — IMO0002 Reserved for concepts with insufficient information to code with codable children: Secondary | ICD-10-CM | POA: Insufficient documentation

## 2013-07-28 DIAGNOSIS — F17200 Nicotine dependence, unspecified, uncomplicated: Secondary | ICD-10-CM | POA: Insufficient documentation

## 2013-07-28 DIAGNOSIS — Y9389 Activity, other specified: Secondary | ICD-10-CM | POA: Insufficient documentation

## 2013-07-28 DIAGNOSIS — S63509A Unspecified sprain of unspecified wrist, initial encounter: Secondary | ICD-10-CM | POA: Insufficient documentation

## 2013-07-28 DIAGNOSIS — X503XXA Overexertion from repetitive movements, initial encounter: Secondary | ICD-10-CM | POA: Insufficient documentation

## 2013-07-28 DIAGNOSIS — S63501A Unspecified sprain of right wrist, initial encounter: Secondary | ICD-10-CM

## 2013-07-28 DIAGNOSIS — Z79899 Other long term (current) drug therapy: Secondary | ICD-10-CM | POA: Insufficient documentation

## 2013-07-28 DIAGNOSIS — Y9289 Other specified places as the place of occurrence of the external cause: Secondary | ICD-10-CM | POA: Insufficient documentation

## 2013-07-28 MED ORDER — HYDROCODONE-ACETAMINOPHEN 7.5-325 MG/15ML PO SOLN: 15.0000 mL | Freq: Four times a day (QID) | ORAL | Status: DC | PRN

## 2013-07-28 NOTE — ED Notes (Signed)
Patient requesting work note to return to work on 3/11, informed patient that if she requires more that 2 days out of work for pain then she should follow up with orthopaedic MD

## 2013-07-28 NOTE — ED Notes (Signed)
Pain and swelling in right wrist since 07/23/13.  [facility] has been wearing an Ace wrap.  Is getting worse.  Sts she cannot move her wrist.  No known injury.

## 2013-07-28 NOTE — Discharge Instructions (Signed)
Take motrin or vicodin for pain.   Do NOT drive with vicodin.   Follow up with an orthopedic doctor above.   Use splint for comfort.   Return to ER if you have severe pain, numbness and tingling of fingers.

## 2013-07-28 NOTE — ED Provider Notes (Signed)
CSN: [id number]     Arrival date & time 07/28/13  2129 History  This chart was scribed for Diane Canalavid H Gerrit Rafalski, MD by Quintella ReichertMatthew Underwood, ED scribe.  This patient was seen in room MH06/MH06 and the patient's care was started at 10:02 PM.   Chief Complaint  Patient presents with  . Wrist Pain    The history is provided by the patient. No language interpreter was used.    HPI Comments: Diane Stein is a 28 y.o. female who presents to the Emergency Department complaining of progressively-worsening right wrist pain with associated swelling that began one week ago.  Pt was seen by [facility] the day her pain began and placed on Tramadol, which she has been taking without relief.  She has also been wearing an ACE wrap but states her pain has continued to worsen.  Pain is worsened by movement.  Pt denies injury.  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  She admits to h/o "severe sprain" in her wrist one year ago.  She reports she was given Vicodin on that occasion which provided some relief.   Past Medical History  Diagnosis Date  . Asthma   . Bronchitis     History reviewed. No pertinent past surgical history.  No family history on file.   History  Substance Use Topics  . Smoking status: Current Every Day Smoker -- 0.50 packs/day    Types: Cigarettes  . Smokeless tobacco: Never Used  . Alcohol Use: 0.0 oz/week    OB History   Grav Para Term Preterm Abortions TAB SAB Ect Mult Living                   Review of Systems  Musculoskeletal: Positive for arthralgias (right wrist) and joint swelling (right wrist).  All other systems reviewed and are negative.      Allergies  Oxycodone  Home Medications   Current Outpatient Rx  Name  Route  Sig  Dispense  Refill  . promethazine (PHENERGAN) 25 MG tablet   Oral   Take 25 mg by mouth every 6 (six) hours as needed for nausea or vomiting.         . traMADol (ULTRAM) 50 MG tablet   Oral   Take by mouth every 6 (six) hours as  needed.         . famotidine (PEPCID) 20 MG tablet   Oral   Take 1 tablet (20 mg total) by mouth 2 (two) times daily.   30 tablet   0   . HYDROcodone-acetaminophen (HYCET) 7.5-325 mg/15 ml solution   Oral   Take 15 mLs by mouth 4 (four) times daily as needed for moderate pain.   150 mL   0   . HYDROcodone-acetaminophen (HYCET) 7.5-325 mg/15 ml solution   Oral   Take 15 mLs by mouth 4 (four) times daily as needed for moderate pain.   120 mL   0   . hydrOXYzine (ATARAX/VISTARIL) 25 MG tablet   Oral   Take 1 tablet (25 mg total) by mouth every 6 (six) hours.   12 tablet   0   . predniSONE (DELTASONE) 20 MG tablet   Oral   Take 2 tablets (40 mg total) by mouth daily. Take 40 mg by mouth daily for 3 days, then 20mg  by mouth daily for 3 days, then 10mg  daily for 3 days   12 tablet   0    BP 124/65  Pulse 82  Temp(Src) 98.2  F (36.8 C) (Oral)  Resp 16  Ht 5\' 3"  (1.6 m)  Wt 109 lb 6 oz (49.612 kg)  BMI 19.38 kg/m2  SpO2 98%  LMP 07/26/2013  Physical Exam  Nursing note and vitals reviewed. Constitutional: She is oriented to person, place, and time. She appears well-developed and well-nourished. No distress.  HENT:  Head: Normocephalic and atraumatic.  Eyes: EOM are normal.  Neck: Neck supple. No tracheal deviation present.  Cardiovascular: Normal rate.   Pulmonary/Chest: Effort normal. No respiratory distress.  Musculoskeletal:       Right wrist: She exhibits decreased range of motion and tenderness.  Tenderness over the right wrist joint.  Decreased ROM due to pain.  Normal hand grasp.  2+ pulses.  Good capillary refill.    Neurological: She is alert and oriented to person, place, and time.  Skin: Skin is warm and dry.  Psychiatric: She has a normal mood and affect. Her behavior is normal.    ED Course  Procedures (including critical care time)  DIAGNOSTIC STUDIES: Oxygen Saturation is 98% on room air, normal by my interpretation.    COORDINATION OF  CARE: 10:08 PM-Discussed treatment plan which includes wrist x-ray, velcro splint application, and pain medication with pt at bedside and pt agreed to plan.     Labs Review Labs Reviewed - No data to display  Imaging Review Dg Wrist Complete Right  07/28/2013   CLINICAL DATA:  Wrist pain and swelling for the past week. Unsure when injured  EXAM: RIGHT WRIST - COMPLETE 3+ VIEW  COMPARISON:  None.  FINDINGS: No fracture or dislocation.  No scaphoid fracture detected. If there were persistent scaphoid region tenderness, then followup plain film examination in 7-10 days or MR could be obtained to exclude occult scaphoid injury.  IMPRESSION: No fracture or dislocation.  Please see above.   Electronically Signed   By: Bridgett Larsson M.D.   On: 07/28/2013 22:23     EKG Interpretation None      MDM   Final diagnoses:  None  Diane Stein is a 28 y.o. female here with R wrist sprain. Xray showed no fracture and she had no injury. She just had repetitive motion. Given thumb spica. Will change tramadol to vicodin, give ortho f/u.     I personally performed the services described in this documentation, which was scribed in my presence. The recorded information has been reviewed and is accurate.   Diane Canal, MD 07/28/13 2232

## 2013-08-03 ENCOUNTER — Emergency Department (HOSPITAL_BASED_OUTPATIENT_CLINIC_OR_DEPARTMENT_OTHER)
Admission: EM | Admit: 2013-08-03 | Discharge: 2013-08-03 | Disposition: A | Discharge status: Home / Self Care | Payer: 59 | Attending: Emergency Medicine | Admitting: Emergency Medicine

## 2013-08-03 ENCOUNTER — Encounter (HOSPITAL_BASED_OUTPATIENT_CLINIC_OR_DEPARTMENT_OTHER): Payer: Self-pay | Admitting: Emergency Medicine

## 2013-08-03 DIAGNOSIS — M25539 Pain in unspecified wrist: Secondary | ICD-10-CM | POA: Insufficient documentation

## 2013-08-03 DIAGNOSIS — F17200 Nicotine dependence, unspecified, uncomplicated: Secondary | ICD-10-CM | POA: Insufficient documentation

## 2013-08-03 DIAGNOSIS — J45909 Unspecified asthma, uncomplicated: Secondary | ICD-10-CM | POA: Insufficient documentation

## 2013-08-03 DIAGNOSIS — Z79899 Other long term (current) drug therapy: Secondary | ICD-10-CM | POA: Insufficient documentation

## 2013-08-03 MED ORDER — MECLIZINE HCL 25 MG PO TABS: 25.0000 mg | ORAL_TABLET | Freq: Once | ORAL | Status: DC

## 2013-08-03 MED ORDER — HYDROCODONE-ACETAMINOPHEN 5-325 MG PO TABS: 1.0000 | ORAL_TABLET | ORAL | Status: DC | PRN

## 2013-08-03 NOTE — ED Provider Notes (Signed)
CSN: [id number]     Arrival date & time 08/03/13  1218 History   First MD Initiated Contact with Patient 08/03/13 1230     Chief Complaint  Patient presents with  . Wrist Pain     (Consider location/radiation/quality/duration/timing/severity/associated sxs/prior Treatment) HPI Comments: Pt states that she has been having ongoing wrist pain for the last 2 weeks. Pt was seen here and had an xray and given something for pain and then was seen at [facility] yesterday and had joint injection and she states that the symptoms seem worse today. Pt state that she got some ultram as well but it isn't helping.pt denies redness or swelling to the area  The history is provided by the patient. No language interpreter was used.    Past Medical History  Diagnosis Date  . Asthma   . Bronchitis    History reviewed. No pertinent past surgical history. No family history on file. History  Substance Use Topics  . Smoking status: Current Every Day Smoker -- 0.50 packs/day    Types: Cigarettes  . Smokeless tobacco: Never Used  . Alcohol Use: 0.0 oz/week   OB History   Grav Para Term Preterm Abortions TAB SAB Ect Mult Living                 Review of Systems  Constitutional: Negative.   Respiratory: Negative.   Cardiovascular: Negative.       Allergies  Oxycodone  Home Medications   Current Outpatient Rx  Name  Route  Sig  Dispense  Refill  . famotidine (PEPCID) 20 MG tablet   Oral   Take 1 tablet (20 mg total) by mouth 2 (two) times daily.   30 tablet   0   . HYDROcodone-acetaminophen (HYCET) 7.5-325 mg/15 ml solution   Oral   Take 15 mLs by mouth 4 (four) times daily as needed for moderate pain.   150 mL   0   . HYDROcodone-acetaminophen (HYCET) 7.5-325 mg/15 ml solution   Oral   Take 15 mLs by mouth 4 (four) times daily as needed for moderate pain.   120 mL   0   . HYDROcodone-acetaminophen (HYCET) 7.5-325 mg/15 ml solution   Oral   Take 15 mLs by mouth 4 (four) times  daily as needed for moderate pain.   120 mL   0   . hydrOXYzine (ATARAX/VISTARIL) 25 MG tablet   Oral   Take 1 tablet (25 mg total) by mouth every 6 (six) hours.   12 tablet   0   . predniSONE (DELTASONE) 20 MG tablet   Oral   Take 2 tablets (40 mg total) by mouth daily. Take 40 mg by mouth daily for 3 days, then 20mg  by mouth daily for 3 days, then 10mg  daily for 3 days   12 tablet   0   . promethazine (PHENERGAN) 25 MG tablet   Oral   Take 25 mg by mouth every 6 (six) hours as needed for nausea or vomiting.         . traMADol (ULTRAM) 50 MG tablet   Oral   Take by mouth every 6 (six) hours as needed.          BP 131/67  Pulse 89  Ht 5\' 3"  (1.6 m)  Wt 118 lb 9.6 oz (53.797 kg)  BMI 21.01 kg/m2  SpO2 97%  LMP 07/26/2013 Physical Exam  Nursing note and vitals reviewed. Constitutional: She is oriented to person, place, and time.  Cardiovascular:  Normal rate and regular rhythm.   Pulmonary/Chest: Effort normal and breath sounds normal.  Musculoskeletal: Normal range of motion.  Pt has full rom of left wrist. No deformity redness or warmth noted to the area  Neurological: She is alert and oriented to person, place, and time.  Skin: Skin is warm and dry.  Psychiatric: She has a normal mood and affect.    ED Course  Procedures (including critical care time) Labs Review Labs Reviewed - No data to display Imaging Review No results found.   EKG Interpretation None      MDM   Final diagnoses:  Wrist pain    No infection noted. Consider increased pain the day after wnl. Pt given follow up with DR. Vivi Barrackhudnall    Tuere Nwosu, NP 08/03/13 1301

## 2013-08-03 NOTE — ED Provider Notes (Signed)
Medical screening examination/treatment/procedure(s) were performed by non-physician practitioner and as supervising physician I was immediately available for consultation/collaboration.   EKG Interpretation None        Layla MawKristen N Evely Gainey, DO 08/03/13 1423

## 2013-08-03 NOTE — Discharge Instructions (Signed)
Follow up as discussed for continued symptomsWrist Pain Wrist injuries are frequent in adults and children. A sprain is an injury to the ligaments that hold your bones together. A strain is an injury to muscle or muscle cord-like structures (tendons) from stretching or pulling. Generally, when wrists are moderately tender to touch following a fall or injury, a break in the bone (fracture) may be present. Most wrist sprains or strains are better in 3 to 5 days, but complete healing may take several weeks. HOME CARE INSTRUCTIONS   Put ice on the injured area.  Put ice in a plastic bag.  Place a towel between your skin and the bag.  Leave the ice on for 15-20 minutes, 03-04 times a day, for the first 2 days.  Keep your arm raised above the level of your heart whenever possible to reduce swelling and pain.  Rest the injured area for at least 48 hours or as directed by your caregiver.  If a splint or elastic bandage has been applied, use it for as long as directed by your caregiver or until seen by a caregiver for a follow-up exam.  Only take over-the-counter or prescription medicines for pain, discomfort, or fever as directed by your caregiver.  Keep all follow-up appointments. You may need to follow up with a specialist or have follow-up X-rays. Improvement in pain level is not a guarantee that you did not fracture a bone in your wrist. The only way to determine whether or not you have a broken bone is by X-ray. SEEK IMMEDIATE MEDICAL CARE IF:   Your fingers are swollen, very red, white, or cold and blue.  Your fingers are numb or tingling.  You have increasing pain.  You have difficulty moving your fingers. MAKE SURE YOU:   Understand these instructions.  Will watch your condition.  Will get help right away if you are not doing well or get worse. Document Released: 02/19/2005 Document Revised: 08/04/2011 Document Reviewed: 07/03/2010 [facility] Patient Information 2014 [place].

## 2013-08-03 NOTE — ED Notes (Signed)
Pt c/o right wrist and forearm pain. Pt sts she has been seen and treated here. [facility] area with Lidocaine per pt.

## 2013-08-26 ENCOUNTER — Encounter (HOSPITAL_BASED_OUTPATIENT_CLINIC_OR_DEPARTMENT_OTHER): Payer: Self-pay | Admitting: Emergency Medicine

## 2013-08-26 DIAGNOSIS — N949 Unspecified condition associated with female genital organs and menstrual cycle: Secondary | ICD-10-CM | POA: Insufficient documentation

## 2013-08-26 DIAGNOSIS — Z3202 Encounter for pregnancy test, result negative: Secondary | ICD-10-CM | POA: Insufficient documentation

## 2013-08-26 DIAGNOSIS — M654 Radial styloid tenosynovitis [de Quervain]: Secondary | ICD-10-CM | POA: Insufficient documentation

## 2013-08-26 DIAGNOSIS — F17200 Nicotine dependence, unspecified, uncomplicated: Secondary | ICD-10-CM | POA: Insufficient documentation

## 2013-08-26 DIAGNOSIS — Z79899 Other long term (current) drug therapy: Secondary | ICD-10-CM | POA: Insufficient documentation

## 2013-08-26 DIAGNOSIS — J45909 Unspecified asthma, uncomplicated: Secondary | ICD-10-CM | POA: Insufficient documentation

## 2013-08-26 DIAGNOSIS — IMO0002 Reserved for concepts with insufficient information to code with codable children: Secondary | ICD-10-CM | POA: Insufficient documentation

## 2013-08-26 DIAGNOSIS — N898 Other specified noninflammatory disorders of vagina: Secondary | ICD-10-CM | POA: Insufficient documentation

## 2013-08-26 LAB — URINALYSIS, ROUTINE W REFLEX MICROSCOPIC
Bilirubin Urine: NEGATIVE
GLUCOSE, UA: NEGATIVE mg/dL
HGB URINE DIPSTICK: NEGATIVE
Ketones, ur: NEGATIVE mg/dL
Leukocytes, UA: NEGATIVE
Nitrite: NEGATIVE
PH: 6 (ref 5.0–8.0)
Protein, ur: NEGATIVE mg/dL
SPECIFIC GRAVITY, URINE: 1.017 (ref 1.005–1.030)
Urobilinogen, UA: 0.2 mg/dL (ref 0.0–1.0)

## 2013-08-26 LAB — PREGNANCY, URINE: PREG TEST UR: NEGATIVE

## 2013-08-26 NOTE — ED Notes (Signed)
Low abd pain and swelling x2 weeks with abnormal bleeding.  Also right wrist pain started again today.  Hx of wrist sprain last month.

## 2013-08-27 ENCOUNTER — Emergency Department (HOSPITAL_BASED_OUTPATIENT_CLINIC_OR_DEPARTMENT_OTHER)
Admission: EM | Admit: 2013-08-27 | Discharge: 2013-08-27 | Disposition: A | Discharge status: Home / Self Care | Payer: 59 | Attending: Emergency Medicine | Admitting: Emergency Medicine

## 2013-08-27 DIAGNOSIS — R102 Pelvic and perineal pain: Secondary | ICD-10-CM

## 2013-08-27 DIAGNOSIS — M654 Radial styloid tenosynovitis [de Quervain]: Secondary | ICD-10-CM

## 2013-08-27 LAB — WET PREP, GENITAL
TRICH WET PREP: NONE SEEN
YEAST WET PREP: NONE SEEN

## 2013-08-27 MED ORDER — HYDROCODONE-ACETAMINOPHEN 5-325 MG PO TABS
2.0000 | ORAL_TABLET | Freq: Once | ORAL | Status: AC
  Administered 2013-08-27: 2 via ORAL
  Filled 2013-08-27: qty 2

## 2013-08-27 MED ORDER — IBUPROFEN 400 MG/4ML IV SOLN: 400.0000 mg | Freq: Four times a day (QID) | INTRAVENOUS | Status: DC

## 2013-08-27 MED ORDER — HYDROCODONE-ACETAMINOPHEN 7.5-325 MG/15ML PO SOLN: 10.0000 mL | Freq: Four times a day (QID) | ORAL | Status: DC | PRN

## 2013-08-27 MED ORDER — METRONIDAZOLE 500 MG PO TABS: 500.0000 mg | ORAL_TABLET | Freq: Two times a day (BID) | ORAL | Status: DC

## 2013-08-27 NOTE — Discharge Instructions (Signed)
See primary care as requested for the wrist and the abdominal pain.  De Quervain's Tenosynovitis De Quervain's tenosynovitis involves inflammation of one or two tendon linings (sheaths) or strain of one or two tendons to the thumb: extensor pollicis brevis (EPB), or abductor pollicis longus (APL). This causes pain on the side of the wrist and base of the thumb. Tendon sheaths secrete a fluid that lubricates the tendon, allowing the tendon to move smoothly. When the sheath becomes inflamed, the tendon cannot move freely in the sheath. Both the EPB and APL tendons are important for proper use of the hand. The EPB tendon is important for straightening the thumb. The APL tendon is important for moving the thumb away from the index finger (abducting). The two tendons pass through a small tube (canal) in the wrist, near the base of the thumb. When the tendons become inflamed, pain is usually felt in this area. SYMPTOMS   Pain, tenderness, swelling, warmth, or redness over the base of the thumb and thumb side of the wrist.  Pain that gets worse when straightening the thumb.  Pain that gets worse when moving the thumb away from the index finger, against resistance.  Pain with pinching or gripping.  Locking or catching of the thumb.  Limited motion of the thumb.  Crackling sound (crepitation) when the tendon or thumb is moved or touched.  Fluid-filled cyst in the area of the base of the thumb. CAUSES   Tenosynovitis is often linked with overuse of the wrist.  Tenosynovitis may be caused by repeated injury to the thumb muscle and tendon units, and with repeated motions of the hand and wrist, due to friction of the tendon within the lining (sheath).  Tenosynovitis may also be due to a sudden increase in activity or change in activity. RISK INCREASES WITH:  Sports that involve repeated hand and wrist motions (golf, bowling, tennis, squash, racquetball).  Heavy labor.  Poor physical wrist  strength and flexibility.  Failure to warm up properly before practice or play.  Female gender.  New mothers who hold their baby's head for long periods or lift infants with thumbs in the infant's armpit (axilla). PREVENTION  Warm up and stretch properly before practice or competition.  Allow enough time for rest and recovery between practices and competition.  Maintain appropriate conditioning:  Cardiovascular fitness.  Forearm, wrist, and hand flexibility.  Muscle strength and endurance.  Use proper exercise technique. PROGNOSIS  This condition is usually curable within 6 weeks, if treated properly with non-surgical treatment and resting of the affected area.  RELATED COMPLICATIONS   Longer healing time if not properly treated or if not given enough time to heal.  Chronic inflammation, causing recurring symptoms of tenosynovitis. Permanent pain or restriction of movement.  Risks of surgery: infection, bleeding, injury to nerves (numbness of the thumb), continued pain, incomplete release of the tendon sheath, recurring symptoms, cutting of the tendons, tendons sliding out of position, weakness of the thumb, thumb stiffness. TREATMENT  First, treatment involves the use of medicine and ice, to reduce pain and inflammation. Patients are encouraged to stop or modify activities that aggravate the injury. Stretching and strengthening exercises may be advised. Exercises may be completed at home or with a therapist. You may be fitted with a brace or splint, to limit motion and allow the injury to heal. Your caregiver may also choose to give you a corticosteroid injection, to reduce the pain and inflammation. If non-surgical treatment is not successful, surgery may be  needed. Most tenosynovitis surgeries are done as outpatient procedures (you go home the same day). Surgery may involve local, regional (whole arm), or general anesthesia.  MEDICATION   If pain medicine is needed, nonsteroidal  anti-inflammatory medicines (aspirin and ibuprofen), or other minor pain relievers (acetaminophen), are often advised.  Do not take pain medicine for 7 days before surgery.  Prescription pain relievers are often prescribed only after surgery. Use only as directed and only as much as you need.  Corticosteroid injections may be given if your caregiver thinks they are needed. There is a limited number of times these injections may be given. COLD THERAPY   Cold treatment (icing) should be applied for 10 to 15 minutes every 2 to 3 hours for inflammation and pain, and immediately after activity that aggravates your symptoms. Use ice packs or an ice massage. SEEK MEDICAL CARE IF:   Symptoms get worse or do not improve in 2 to 4 weeks, despite treatment.  You experience pain, numbness, or coldness in the hand.  Blue, gray, or dark color appears in the fingernails.  Any of the following occur after surgery: increased pain, swelling, redness, drainage of fluids, bleeding in the affected area, or signs of infection.  New, unexplained symptoms develop. (Drugs used in treatment may produce side effects.) Document Released: 05/12/2005 Document Revised: 08/04/2011 Document Reviewed: 08/24/2008 [facility] Patient Information 2014 [place], [place]. Bacterial Vaginosis Bacterial vaginosis is a vaginal infection that occurs when the normal balance of bacteria in the vagina is disrupted. It results from an overgrowth of certain bacteria. This is the most common vaginal infection in women of childbearing age. Treatment is important to prevent complications, especially in pregnant women, as it can cause a premature delivery. CAUSES  Bacterial vaginosis is caused by an increase in harmful bacteria that are normally present in smaller amounts in the vagina. Several different kinds of bacteria can cause bacterial vaginosis. However, the reason that the condition develops is not fully understood. RISK FACTORS Certain  activities or behaviors can put you at an increased risk of developing bacterial vaginosis, including:  Having a new sex partner or multiple sex partners.  Douching.  Using an intrauterine device (IUD) for contraception. Women do not get bacterial vaginosis from toilet seats, bedding, swimming pools, or contact with objects around them. SIGNS AND SYMPTOMS  Some women with bacterial vaginosis have no signs or symptoms. Common symptoms include:  Grey vaginal discharge.  A fishlike odor with discharge, especially after sexual intercourse.  Itching or burning of the vagina and vulva.  Burning or pain with urination. DIAGNOSIS  Your health care provider will take a medical history and examine the vagina for signs of bacterial vaginosis. A sample of vaginal fluid may be taken. Your health care provider will look at this sample under a microscope to check for bacteria and abnormal cells. A vaginal pH test may also be done.  TREATMENT  Bacterial vaginosis may be treated with antibiotic medicines. These may be given in the form of a pill or a vaginal cream. A second round of antibiotics may be prescribed if the condition comes back after treatment.  HOME CARE INSTRUCTIONS   Only take over-the-counter or prescription medicines as directed by your health care provider.  If antibiotic medicine was prescribed, take it as directed. Make sure you finish it even if you start to feel better.  Do not have sex until treatment is completed.  Tell all sexual partners that you have a vaginal infection. They should see  their health care provider and be treated if they have problems, such as a mild rash or itching.  Practice safe sex by using condoms and only having one sex partner. SEEK MEDICAL CARE IF:   Your symptoms are not improving after 3 days of treatment.  You have increased discharge or pain.  You have a fever. MAKE SURE YOU:   Understand these instructions.  Will watch your  condition.  Will get help right away if you are not doing well or get worse. FOR MORE INFORMATION  Centers for Disease Control and Prevention, Division of STD Prevention: [employer] American Sexual Health Association (ASHA): www.ashastd.org  Document Released: 05/12/2005 Document Revised: 03/02/2013 Document Reviewed: 12/22/2012 [facility] Patient Information 2014 [place], [place].

## 2013-08-27 NOTE — ED Notes (Signed)
MD at bedside. 

## 2013-08-27 NOTE — ED Provider Notes (Signed)
CSN: [id number]     Arrival date & time 08/26/13  2323 History   First MD Initiated Contact with Patient 08/27/13 [phone]     Chief Complaint  Patient presents with  . Abdominal Pain     (Consider location/radiation/quality/duration/timing/severity/associated sxs/prior Treatment) HPI Comments: Pt comes to the ER with cc of abd pain, vaginal bleeding, and wrist pain. Pt has been seen for wrist pain earlier. No trauma. Left wrist pain, worse with movement.  Pt has been having abd pain x 2 weeks, with some vaginal discharge, clear, and bleeding. She describes the pain as sharp, and worsening. No n/v/f/c. No hx of STD, and seeing one partner only x 6 years.  Patient is a 28 y.o. female presenting with abdominal pain. The history is provided by the patient.  Abdominal Pain Associated symptoms: vaginal bleeding and vaginal discharge   Associated symptoms: no chest pain, no dysuria, no nausea, no shortness of breath and no vomiting     Past Medical History  Diagnosis Date  . Asthma   . Bronchitis    History reviewed. No pertinent past surgical history. No family history on file. History  Substance Use Topics  . Smoking status: Current Every Day Smoker -- 0.50 packs/day    Types: Cigarettes  . Smokeless tobacco: Never Used  . Alcohol Use: 0.0 oz/week   OB History   Grav Para Term Preterm Abortions TAB SAB Ect Mult Living                 Review of Systems  Constitutional: Negative for activity change.  Respiratory: Negative for shortness of breath.   Cardiovascular: Negative for chest pain.  Gastrointestinal: Positive for abdominal pain. Negative for nausea and vomiting.  Genitourinary: Positive for vaginal bleeding and vaginal discharge. Negative for dysuria and flank pain.  Musculoskeletal: Positive for arthralgias. Negative for neck pain.  Neurological: Negative for headaches.      Allergies  Oxycodone  Home Medications   Current Outpatient Rx  Name  Route  Sig   Dispense  Refill  . famotidine (PEPCID) 20 MG tablet   Oral   Take 1 tablet (20 mg total) by mouth 2 (two) times daily.   30 tablet   0   . HYDROcodone-acetaminophen (HYCET) 7.5-325 mg/15 ml solution   Oral   Take 15 mLs by mouth 4 (four) times daily as needed for moderate pain.   150 mL   0   . HYDROcodone-acetaminophen (HYCET) 7.5-325 mg/15 ml solution   Oral   Take 15 mLs by mouth 4 (four) times daily as needed for moderate pain.   120 mL   0   . HYDROcodone-acetaminophen (HYCET) 7.5-325 mg/15 ml solution   Oral   Take 15 mLs by mouth 4 (four) times daily as needed for moderate pain.   120 mL   0   . HYDROcodone-acetaminophen (NORCO/VICODIN) 5-325 MG per tablet   Oral   Take 1 tablet by mouth every 4 (four) hours as needed.   5 tablet   0   . hydrOXYzine (ATARAX/VISTARIL) 25 MG tablet   Oral   Take 1 tablet (25 mg total) by mouth every 6 (six) hours.   12 tablet   0   . predniSONE (DELTASONE) 20 MG tablet   Oral   Take 2 tablets (40 mg total) by mouth daily. Take 40 mg by mouth daily for 3 days, then 20mg  by mouth daily for 3 days, then 10mg  daily for 3 days   12 tablet  0   . promethazine (PHENERGAN) 25 MG tablet   Oral   Take 25 mg by mouth every 6 (six) hours as needed for nausea or vomiting.         . traMADol (ULTRAM) 50 MG tablet   Oral   Take by mouth every 6 (six) hours as needed.          BP 126/70  Pulse 101  Temp(Src) 98.3 F (36.8 C) (Oral)  Resp 16  Ht 5' 3.75" (1.619 m)  Wt 117 lb 11.2 oz (53.388 kg)  BMI 20.37 kg/m2  SpO2 99%  LMP 07/26/2013 Physical Exam  Nursing note and vitals reviewed. Constitutional: She is oriented to person, place, and time. She appears well-developed.  HENT:  Head: Normocephalic and atraumatic.  Eyes: Conjunctivae and EOM are normal. Pupils are equal, round, and reactive to light.  Neck: Normal range of motion. Neck supple.  Cardiovascular: Normal rate, regular rhythm, normal heart sounds and intact  distal pulses.   No murmur heard. Pulmonary/Chest: Effort normal. No respiratory distress. She has no wheezes.  Abdominal: Soft. Bowel sounds are normal. She exhibits no distension. There is no tenderness. There is no rebound and no guarding.  Genitourinary: Vagina normal and uterus normal.  External exam - normal, no lesions Speculum exam: Pt has some white discharge, no blood Bimanual exam: Patient has no CMT, no adnexal tenderness or fullness and cervical os is closed  Musculoskeletal:  Left wrist and distal radius tenderness, with no deformity. + finkelsteins  Neurological: She is alert and oriented to person, place, and time.  Skin: Skin is warm and dry.    ED Course  Procedures (including critical care time) Labs Review Labs Reviewed  WET PREP, GENITAL - Abnormal; Notable for the following:    Clue Cells Wet Prep HPF POC FEW (*)    WBC, Wet Prep HPF POC FEW (*)    All other components within normal limits  URINALYSIS, ROUTINE W REFLEX MICROSCOPIC - Abnormal; Notable for the following:    APPearance CLOUDY (*)    All other components within normal limits  GC/CHLAMYDIA PROBE AMP  PREGNANCY, URINE   Imaging Review No results found.   EKG Interpretation None      MDM   Final diagnoses:  None    Pt comes in with abd pain and wrist pain. Not sure what causing the wrist pain. It is certainly not traumatic. We will not get Xrays this visit. Will ask her to continue using the splint and pcp follow up. There is no numbness right now, SHE DID HAVE + Lourena Simmonds - so this could be [employer]  Abd exam and Gu exam are benign U preg is neg. She has hx of ovarian cyst -so the bleeding and pain could be related.  Advised patient to see a primary care doctor for further pain management.   Derwood Kaplan, MD 08/27/13 [phone]

## 2013-08-29 LAB — GC/CHLAMYDIA PROBE AMP
CT PROBE, AMP APTIMA: POSITIVE — AB
GC PROBE AMP APTIMA: NEGATIVE

## 2013-08-31 ENCOUNTER — Telehealth (HOSPITAL_BASED_OUTPATIENT_CLINIC_OR_DEPARTMENT_OTHER): Payer: Self-pay | Admitting: Emergency Medicine

## 2013-08-31 NOTE — Telephone Encounter (Signed)
Patient called for her test results informed her of +Chlamydia and need for Rx. Informed her that chart had been sent to EDP for review and that she would receive a call when the medication had been prescribed. Patient requested that we [facility] [place] [facility] [place].

## 2013-08-31 NOTE — Telephone Encounter (Signed)
+  Chlamydia. Chart sent to EDP office for review. DHHS attached. 

## 2013-09-19 ENCOUNTER — Encounter (HOSPITAL_COMMUNITY): Payer: Self-pay | Admitting: Emergency Medicine

## 2013-09-19 ENCOUNTER — Emergency Department (HOSPITAL_COMMUNITY)
Admission: EM | Admit: 2013-09-19 | Discharge: 2013-09-20 | Disposition: A | Discharge status: Home / Self Care | Payer: 59 | Attending: Emergency Medicine | Admitting: Emergency Medicine

## 2013-09-19 DIAGNOSIS — Z791 Long term (current) use of non-steroidal anti-inflammatories (NSAID): Secondary | ICD-10-CM | POA: Insufficient documentation

## 2013-09-19 DIAGNOSIS — S6990XA Unspecified injury of unspecified wrist, hand and finger(s), initial encounter: Principal | ICD-10-CM

## 2013-09-19 DIAGNOSIS — Y9389 Activity, other specified: Secondary | ICD-10-CM | POA: Insufficient documentation

## 2013-09-19 DIAGNOSIS — S59909A Unspecified injury of unspecified elbow, initial encounter: Secondary | ICD-10-CM | POA: Insufficient documentation

## 2013-09-19 DIAGNOSIS — X500XXA Overexertion from strenuous movement or load, initial encounter: Secondary | ICD-10-CM | POA: Insufficient documentation

## 2013-09-19 DIAGNOSIS — J45909 Unspecified asthma, uncomplicated: Secondary | ICD-10-CM | POA: Insufficient documentation

## 2013-09-19 DIAGNOSIS — Y9289 Other specified places as the place of occurrence of the external cause: Secondary | ICD-10-CM | POA: Insufficient documentation

## 2013-09-19 DIAGNOSIS — IMO0002 Reserved for concepts with insufficient information to code with codable children: Secondary | ICD-10-CM | POA: Insufficient documentation

## 2013-09-19 DIAGNOSIS — Z792 Long term (current) use of antibiotics: Secondary | ICD-10-CM | POA: Insufficient documentation

## 2013-09-19 DIAGNOSIS — S6991XA Unspecified injury of right wrist, hand and finger(s), initial encounter: Secondary | ICD-10-CM

## 2013-09-19 DIAGNOSIS — F17200 Nicotine dependence, unspecified, uncomplicated: Secondary | ICD-10-CM | POA: Insufficient documentation

## 2013-09-19 DIAGNOSIS — Z79899 Other long term (current) drug therapy: Secondary | ICD-10-CM | POA: Insufficient documentation

## 2013-09-19 DIAGNOSIS — S59919A Unspecified injury of unspecified forearm, initial encounter: Principal | ICD-10-CM

## 2013-09-19 NOTE — ED Notes (Signed)
Pt states that she sprained her R wrist 2.5 mo ago and today lifted a lawnmower off of a trailer and is having increased pain. Pt a&o x4, ambulatory to triage, NAD noted at this time.

## 2013-09-19 NOTE — ED Notes (Signed)
Patient is suppose to start PT soon as a result of the initial injury 2 months. Patient has taken ibuprofen with no relief since the pain began.

## 2013-09-20 MED ORDER — HYDROCODONE-ACETAMINOPHEN 7.5-325 MG/15ML PO SOLN
10.0000 mL | Freq: Once | ORAL | Status: AC
  Administered 2013-09-20: 10 mL via ORAL
  Filled 2013-09-20: qty 15

## 2013-09-20 NOTE — ED Provider Notes (Signed)
CSN: [id number]     Arrival date & time 09/19/13  2231 History  This chart was scribed for Fayrene HelperBowie Abdallah Hern by Ladona Ridgelaylor Day, ED scribe. This patient was seen in room WTR9/WTR9 and the patient's care was started at 2231.    Chief Complaint  Patient presents with  . Wrist Injury   The history is provided by the patient. No language interpreter was used.   HPI Comments: Diane Cavalierlizabeth Bordeaux is a 28 y.o. 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 She states that she has some associated numbness in her fingers and elbow. She localizes pain to the radial aspect of her right wrist and that it is tender to touch. She has been using ibuprofen for this pain w/no relief. From her original injury, her pain never fully improved and has since worsened w/this new injury. She states that she saw an orthopedist for the initial injury that suggested that she attends physical therapy and wear a supportive brace.  Past Medical History  Diagnosis Date  . Asthma   . Bronchitis    Past Surgical History  Procedure Laterality Date  . Tooth extraction Right 2008   History reviewed. No pertinent family history. History  Substance Use Topics  . Smoking status: Current Every Day Smoker -- 0.50 packs/day    Types: Cigarettes  . Smokeless tobacco: Never Used  . Alcohol Use: 0.0 oz/week   OB History   Grav Para Term Preterm Abortions TAB SAB Ect Mult Living                 Review of Systems  Constitutional: Negative for fever and chills.  Respiratory: Negative for cough and shortness of breath.   Cardiovascular: Negative for chest pain.  Gastrointestinal: Negative for nausea and vomiting.  Musculoskeletal: Negative for back pain.       Right wrist pain   Skin: Negative for rash.  All other systems reviewed and are negative.   Allergies  Oxycodone  Home Medications   Prior to Admission medications   Medication Sig Start Date End Date Taking? Authorizing Provider  famotidine (PEPCID) 20 MG tablet Take 1 tablet (20 mg total) by mouth 2 (two) times daily. 07/04/13   Shari A Upstill, PA-C  HYDROcodone-acetaminophen (HYCET) 7.5-325 mg/15 ml solution Take 15 mLs by mouth 4 (four) times daily as needed for moderate pain. 07/08/13   Candyce ChurnJohn David Wofford III, MD  HYDROcodone-acetaminophen (HYCET) 7.5-325 mg/15 ml solution Take 15 mLs by mouth 4 (four) times daily as needed for moderate pain. 07/10/13   Shelda JakesScott W. Zackowski, MD  HYDROcodone-acetaminophen (HYCET) 7.5-325 mg/15 ml solution Take 15 mLs by mouth 4 (four) times daily as needed for moderate pain. 07/28/13 07/28/14  Richardean Canalavid H Yao, MD  HYDROcodone-acetaminophen (HYCET) 7.5-325 mg/15 ml solution Take 10 mLs by mouth 4 (four) times daily as needed for moderate pain. 08/27/13 08/27/14  Derwood KaplanAnkit Nanavati, MD  HYDROcodone-acetaminophen (NORCO/VICODIN) 5-325 MG per tablet Take 1 tablet by mouth every 4 (four) hours as needed. 08/03/13   Teressa LowerVrinda Pickering, NP  hydrOXYzine (ATARAX/VISTARIL) 25 MG tablet Take 1 tablet (25 mg total) by mouth every 6 (six) hours. 07/02/13   Kaitlyn Szekalski, PA-C  ibuprofen (CALDOLOR) 400 MG/4ML SOLN injection Inject 4 mLs (  400 mg total) into the vein every 6 (six) hours. 08/27/13   Derwood KaplanAnkit Nanavati, MD  metroNIDAZOLE (FLAGYL) 500 MG tablet Take 1 tablet (500 mg total) by mouth 2 (two) times daily. 08/27/13   Derwood KaplanAnkit Nanavati, MD  predniSONE (DELTASONE) 20 MG tablet Take 2 tablets (40 mg total) by mouth daily. Take 40 mg by mouth daily for 3 days, then 20mg  by mouth daily for 3 days, then 10mg  daily for 3 days 07/02/13   Emilia BeckKaitlyn Szekalski, PA-C  promethazine (PHENERGAN) 25 MG tablet Take 25 mg by mouth every 6 (six) hours as needed for nausea or vomiting.    Historical Provider, MD  traMADol (ULTRAM) 50  MG tablet Take by mouth every 6 (six) hours as needed.    Historical Provider, MD   Triage Vitals: BP 132/81  Pulse 112  Temp(Src) 98.3 F (36.8 C) (Oral)  Resp 20  SpO2 97%  LMP 07/30/2013  Physical Exam  Nursing note and vitals reviewed. Constitutional: She is oriented to person, place, and time. She appears well-developed and well-nourished. No distress.  HENT:  Head: Normocephalic and atraumatic.  Eyes: Conjunctivae are normal. Right eye exhibits no discharge. Left eye exhibits no discharge.  Neck: Normal range of motion.  Cardiovascular: Normal rate.   Pulmonary/Chest: Effort normal. No respiratory distress.  Musculoskeletal: Normal range of motion. She exhibits tenderness. She exhibits no edema.  Right wrist w/decreased ROM to flexion and extension as well supination and pronation. She is tender over the ulnar aspect of her right wrist w/no obvious deformities. Tenderness over anatomical snuffbox, no deformities.   Neurological: She is alert and oriented to person, place, and time.  Skin: Skin is warm and dry.  Psychiatric: She has a normal mood and affect. Thought content normal.    ED Course  Procedures (including critical care time) DIAGNOSTIC STUDIES: Oxygen Saturation is 97% on room air, adequate by my interpretation.    COORDINATION OF CARE: At 1204 Discussed treatment plan with patient which includes splint for hand. Patient agrees.  Suggest f/o with specialist   12:39 AM Patient presents with right wrist injury with tenderness to the anatomical snuffbox. Initial thought was that patient may have a missed scaphoid fracture from prior injury.  A thumb spica splint was placed today for support. Pt report she is currently being cared for by orthopedist Dr. Willette ClusterBayshore.  She also request for hycet pain medication.  Upon reviewing her prior charts, it was noted that pt has been prescribed hycet 4 separate occasions for the same complaint.  Therefore, i do not think it is  appropriate to continue with prescribing additional narcotic.  I recommend pt to f/u with her orthopedist for further care.  I also offer to xray her wrist, pt declined.    Labs Review Labs Reviewed - No data to display  Imaging Review No results found.   EKG Interpretation None      MDM   Final diagnoses:  Right wrist injury    BP 121/68  Pulse 97  Temp(Src) 98.3 F (36.8 C) (Oral)  Resp 20  SpO2 97%  LMP 07/30/2013  I personally performed the services described in this documentation, which was scribed in my presence. The recorded information has been reviewed and is accurate.      Fayrene HelperBowie Wajiha Versteeg, PA-C 09/20/13 2000

## 2013-09-20 NOTE — Discharge Instructions (Signed)
Please follow up closely with your orthopedist doctor for further management of your wrist injury.  Take ibuprofen for pain.    RICE: Routine Care for Injuries The routine care of many injuries includes Rest, Ice, Compression, and Elevation (RICE). HOME CARE INSTRUCTIONS  Rest is needed to allow your body to heal. Routine activities can usually be resumed when comfortable. Injured tendons and bones can take up to 6 weeks to heal. Tendons are the cord-like structures that attach muscle to bone.  Ice following an injury helps keep the swelling down and reduces pain.  Put ice in a plastic bag.  Place a towel between your skin and the bag.  Leave the ice on for 15-20 minutes, 03-04 times a day. Do this while awake, for the first 24 to 48 hours. After that, continue as directed by your caregiver.  Compression helps keep swelling down. It also gives support and helps with discomfort. If an elastic bandage has been applied, it should be removed and reapplied every 3 to 4 hours. It should not be applied tightly, but firmly enough to keep swelling down. Watch fingers or toes for swelling, bluish discoloration, coldness, numbness, or excessive pain. If any of these problems occur, remove the bandage and reapply loosely. Contact your caregiver if these problems continue.  Elevation helps reduce swelling and decreases pain. With extremities, such as the arms, hands, legs, and feet, the injured area should be placed near or above the level of the heart, if possible. SEEK IMMEDIATE MEDICAL CARE IF:  You have persistent pain and swelling.  You develop redness, numbness, or unexpected weakness.  Your symptoms are getting worse rather than improving after several days. These symptoms may indicate that further evaluation or further X-rays are needed. Sometimes, X-rays may not show a small broken bone (fracture) until 1 week or 10 days later. Make a follow-up appointment with your caregiver. Ask when your  X-ray results will be ready. Make sure you get your X-ray results. Document Released: 08/24/2000 Document Revised: 08/04/2011 Document Reviewed: 10/11/2010 [facility] Patient Information 2014 [place], [place].

## 2013-09-21 NOTE — ED Provider Notes (Signed)
Medical screening examination/treatment/procedure(s) were performed by non-physician practitioner and as supervising physician I was immediately available for consultation/collaboration.    Patrese Neal D Richy Spradley, MD 09/21/13 0034 

## 2013-09-29 ENCOUNTER — Other Ambulatory Visit (HOSPITAL_COMMUNITY)
Admission: RE | Admit: 2013-09-29 | Discharge: 2013-09-29 | Disposition: A | Discharge status: Home / Self Care | Payer: 59 | Source: Ambulatory Visit | Attending: Obstetrics & Gynecology | Admitting: Obstetrics & Gynecology

## 2013-09-29 ENCOUNTER — Other Ambulatory Visit: Payer: Self-pay | Admitting: Obstetrics & Gynecology

## 2013-09-29 DIAGNOSIS — Z01419 Encounter for gynecological examination (general) (routine) without abnormal findings: Secondary | ICD-10-CM | POA: Insufficient documentation

## 2013-11-28 ENCOUNTER — Encounter (HOSPITAL_COMMUNITY): Payer: Self-pay | Admitting: Emergency Medicine

## 2013-11-28 ENCOUNTER — Emergency Department (HOSPITAL_COMMUNITY)
Admission: EM | Admit: 2013-11-28 | Discharge: 2013-11-28 | Disposition: A | Discharge status: Home / Self Care | Payer: 59 | Attending: Emergency Medicine | Admitting: Emergency Medicine

## 2013-11-28 DIAGNOSIS — G8929 Other chronic pain: Secondary | ICD-10-CM | POA: Insufficient documentation

## 2013-11-28 DIAGNOSIS — M25531 Pain in right wrist: Secondary | ICD-10-CM

## 2013-11-28 DIAGNOSIS — F17200 Nicotine dependence, unspecified, uncomplicated: Secondary | ICD-10-CM | POA: Insufficient documentation

## 2013-11-28 DIAGNOSIS — M25539 Pain in unspecified wrist: Secondary | ICD-10-CM | POA: Insufficient documentation

## 2013-11-28 DIAGNOSIS — IMO0002 Reserved for concepts with insufficient information to code with codable children: Secondary | ICD-10-CM | POA: Insufficient documentation

## 2013-11-28 DIAGNOSIS — Z791 Long term (current) use of non-steroidal anti-inflammatories (NSAID): Secondary | ICD-10-CM | POA: Insufficient documentation

## 2013-11-28 DIAGNOSIS — Z79899 Other long term (current) drug therapy: Secondary | ICD-10-CM | POA: Insufficient documentation

## 2013-11-28 DIAGNOSIS — J45909 Unspecified asthma, uncomplicated: Secondary | ICD-10-CM | POA: Insufficient documentation

## 2013-11-28 DIAGNOSIS — Z792 Long term (current) use of antibiotics: Secondary | ICD-10-CM | POA: Insufficient documentation

## 2013-11-28 MED ORDER — HYDROCODONE-ACETAMINOPHEN 5-325 MG PO TABS: 2.0000 | ORAL_TABLET | Freq: Four times a day (QID) | ORAL | Status: DC | PRN

## 2013-11-28 NOTE — ED Provider Notes (Signed)
CSN: [id number]     Arrival date & time 11/28/13  0618 History   First MD Initiated Contact with Patient 11/28/13 [phone]     Chief Complaint  Patient presents with  . Hand Pain     (Consider location/radiation/quality/duration/timing/severity/associated sxs/prior Treatment) HPI Comments: Patient presents with a chief complaint of pain of her right wrist.  She reports that she has had this pain chronically, but the pain worsened two days ago when she picked up her nephew.  Pain has been constant since that time.  She has been applying ice and taking Ibuprofen for the pain without relief.  She reports that she has had a MRI of her wrist that showed "two displaced ligaments, torn ligament, and a bone contusion."  She states that she has an appointment with a Hand Surgeon in two weeks.  She reports that she also has a wrist splint at home, which she has been wearing intermittently.  Denies swelling/erythema of the wrist, numbness, or tingling.  The history is provided by the patient.    Past Medical History  Diagnosis Date  . Asthma   . Bronchitis    Past Surgical History  Procedure Laterality Date  . Tooth extraction Right 2008   History reviewed. No pertinent family history. History  Substance Use Topics  . Smoking status: Current Every Day Smoker -- 0.50 packs/day    Types: Cigarettes  . Smokeless tobacco: Never Used  . Alcohol Use: 0.0 oz/week   OB History   Grav Para Term Preterm Abortions TAB SAB Ect Mult Living                 Review of Systems  All other systems reviewed and are negative.     Allergies  Oxycodone  Home Medications   Prior to Admission medications   Medication Sig Start Date End Date Taking? Authorizing Provider  famotidine (PEPCID) 20 MG tablet Take 1 tablet (20 mg total) by mouth 2 (two) times daily. 07/04/13   Shari A Upstill, PA-C  HYDROcodone-acetaminophen (HYCET) 7.5-325 mg/15 ml solution Take 15 mLs by mouth 4 (four) times daily as needed for  moderate pain. 07/08/13   Candyce ChurnJohn David Wofford III, MD  HYDROcodone-acetaminophen (HYCET) 7.5-325 mg/15 ml solution Take 15 mLs by mouth 4 (four) times daily as needed for moderate pain. 07/10/13   Vanetta MuldersScott Zackowski, MD  HYDROcodone-acetaminophen (HYCET) 7.5-325 mg/15 ml solution Take 15 mLs by mouth 4 (four) times daily as needed for moderate pain. 07/28/13 07/28/14  Richardean Canalavid H Yao, MD  HYDROcodone-acetaminophen (HYCET) 7.5-325 mg/15 ml solution Take 10 mLs by mouth 4 (four) times daily as needed for moderate pain. 08/27/13 08/27/14  Derwood KaplanAnkit Nanavati, MD  HYDROcodone-acetaminophen (NORCO/VICODIN) 5-325 MG per tablet Take 1 tablet by mouth every 4 (four) hours as needed. 08/03/13   Teressa LowerVrinda Pickering, NP  hydrOXYzine (ATARAX/VISTARIL) 25 MG tablet Take 1 tablet (25 mg total) by mouth every 6 (six) hours. 07/02/13   Kaitlyn Szekalski, PA-C  ibuprofen (CALDOLOR) 400 MG/4ML SOLN injection Inject 4 mLs (400 mg total) into the vein every 6 (six) hours. 08/27/13   Derwood KaplanAnkit Nanavati, MD  metroNIDAZOLE (FLAGYL) 500 MG tablet Take 1 tablet (500 mg total) by mouth 2 (two) times daily. 08/27/13   Derwood KaplanAnkit Nanavati, MD  predniSONE (DELTASONE) 20 MG tablet Take 2 tablets (40 mg total) by mouth daily. Take 40 mg by mouth daily for 3 days, then 20mg  by mouth daily for 3 days, then 10mg  daily for 3 days 07/02/13   Emilia BeckKaitlyn Szekalski, PA-C  promethazine (PHENERGAN) 25 MG tablet Take 25 mg by mouth every 6 (six) hours as needed for nausea or vomiting.    Historical Provider, MD  traMADol (ULTRAM) 50 MG tablet Take by mouth every 6 (six) hours as needed.    Historical Provider, MD   BP 126/67  Pulse 86  Temp(Src) 98.4 F (36.9 C) (Oral)  Resp 18  Ht 5\' 3"  (1.6 m)  Wt 115 lb (52.164 kg)  BMI 20.38 kg/m2  SpO2 100%  LMP 11/14/2013 Physical Exam  Nursing note and vitals reviewed. Constitutional: She appears well-developed and well-nourished.  HENT:  Head: Normocephalic and atraumatic.  Neck: Normal range of motion. Neck supple.  Cardiovascular:  Normal rate, regular rhythm and normal heart sounds.   Pulses:      Radial pulses are 2+ on the right side, and 2+ on the left side.  Pulmonary/Chest: Effort normal and breath sounds normal.  Musculoskeletal:       Right wrist: She exhibits tenderness. She exhibits no swelling, no effusion and no deformity.  ROM of right wrist slightly decreased, which patient reports is at baseline No erythema, edema, or warmth of the right wrist  Neurological: She is alert.  Distal sensation of right hand intact  Skin: Skin is warm and dry.  Psychiatric: She has a normal mood and affect.    ED Course  Procedures (including critical care time) Labs Review Labs Reviewed - No data to display  Imaging Review No results found.   EKG Interpretation None      MDM   Final diagnoses:  None   Patient presenting with chronic wrist pain.  No signs of infection on exam.  Patient neurovascularly intact.  Pain worsened after picking up her nephew.  Due to the mechanism of injury do not feel that xrays is indicated at this time.  Patient has appointment with Hand Surgery in two weeks.  Patient has wrist splint at home.  Feel that the patient is stable for discharge.  Patient given short course of pain medications.    Santiago GladHeather Fitzroy Mikami, PA-C 11/28/13 1552

## 2013-11-28 NOTE — ED Notes (Signed)
Pt arrived to the ED with a complaint of right wrist pain.,  Pt had a hand injury in January and has had physical therapy but has overdone her activity level and has experienced great pain for two days.

## 2013-11-29 NOTE — ED Provider Notes (Signed)
Medical screening examination/treatment/procedure(s) were performed by non-physician practitioner and as supervising physician I was immediately available for consultation/collaboration.  Sai Moura T Argentina Kosch, MD 11/29/13 1006 

## 2014-02-24 ENCOUNTER — Emergency Department (HOSPITAL_BASED_OUTPATIENT_CLINIC_OR_DEPARTMENT_OTHER): Payer: Medicaid Other

## 2014-02-24 ENCOUNTER — Emergency Department (HOSPITAL_BASED_OUTPATIENT_CLINIC_OR_DEPARTMENT_OTHER)
Admission: EM | Admit: 2014-02-24 | Discharge: 2014-02-24 | Disposition: A | Discharge status: Home / Self Care | Payer: Medicaid Other | Attending: Emergency Medicine | Admitting: Emergency Medicine

## 2014-02-24 ENCOUNTER — Encounter (HOSPITAL_BASED_OUTPATIENT_CLINIC_OR_DEPARTMENT_OTHER): Payer: Self-pay | Admitting: Emergency Medicine

## 2014-02-24 DIAGNOSIS — R0602 Shortness of breath: Secondary | ICD-10-CM | POA: Diagnosis present

## 2014-02-24 DIAGNOSIS — R1110 Vomiting, unspecified: Secondary | ICD-10-CM | POA: Diagnosis not present

## 2014-02-24 DIAGNOSIS — J40 Bronchitis, not specified as acute or chronic: Secondary | ICD-10-CM

## 2014-02-24 DIAGNOSIS — R000 Tachycardia, unspecified: Secondary | ICD-10-CM | POA: Diagnosis not present

## 2014-02-24 DIAGNOSIS — J45901 Unspecified asthma with (acute) exacerbation: Secondary | ICD-10-CM | POA: Diagnosis not present

## 2014-02-24 DIAGNOSIS — Z720 Tobacco use: Secondary | ICD-10-CM | POA: Diagnosis not present

## 2014-02-24 DIAGNOSIS — Z793 Long term (current) use of hormonal contraceptives: Secondary | ICD-10-CM | POA: Diagnosis not present

## 2014-02-24 DIAGNOSIS — Z8701 Personal history of pneumonia (recurrent): Secondary | ICD-10-CM | POA: Diagnosis not present

## 2014-02-24 DIAGNOSIS — Z79899 Other long term (current) drug therapy: Secondary | ICD-10-CM | POA: Insufficient documentation

## 2014-02-24 MED ORDER — ALBUTEROL SULFATE HFA 108 (90 BASE) MCG/ACT IN AERS
1.0000 | INHALATION_SPRAY | RESPIRATORY_TRACT | Status: DC | PRN
Start: 2014-02-24 — End: 2014-02-24
  Administered 2014-02-24: 2 via RESPIRATORY_TRACT
  Filled 2014-02-24: qty 6.7

## 2014-02-24 MED ORDER — ALBUTEROL SULFATE (2.5 MG/3ML) 0.083% IN NEBU
2.5000 mg | INHALATION_SOLUTION | Freq: Once | RESPIRATORY_TRACT | Status: AC
  Administered 2014-02-24: 2.5 mg via RESPIRATORY_TRACT

## 2014-02-24 MED ORDER — ALBUTEROL SULFATE (2.5 MG/3ML) 0.083% IN NEBU
INHALATION_SOLUTION | RESPIRATORY_TRACT | Status: AC
  Administered 2014-02-24: 2.5 mg via RESPIRATORY_TRACT
  Filled 2014-02-24: qty 3

## 2014-02-24 MED ORDER — IPRATROPIUM BROMIDE 0.02 % IN SOLN
0.5000 mg | Freq: Once | RESPIRATORY_TRACT | Status: AC
  Administered 2014-02-24: 0.5 mg via RESPIRATORY_TRACT
  Filled 2014-02-24: qty 2.5

## 2014-02-24 MED ORDER — IPRATROPIUM-ALBUTEROL 0.5-2.5 (3) MG/3ML IN SOLN
3.0000 mL | Freq: Once | RESPIRATORY_TRACT | Status: AC
  Administered 2014-02-24: 3 mL via RESPIRATORY_TRACT

## 2014-02-24 MED ORDER — IPRATROPIUM-ALBUTEROL 0.5-2.5 (3) MG/3ML IN SOLN
RESPIRATORY_TRACT | Status: AC
  Administered 2014-02-24: 3 mL via RESPIRATORY_TRACT
  Filled 2014-02-24: qty 3

## 2014-02-24 MED ORDER — ALBUTEROL SULFATE (2.5 MG/3ML) 0.083% IN NEBU
5.0000 mg | INHALATION_SOLUTION | Freq: Once | RESPIRATORY_TRACT | Status: AC
  Administered 2014-02-24: 5 mg via RESPIRATORY_TRACT
  Filled 2014-02-24: qty 6

## 2014-02-24 NOTE — ED Provider Notes (Signed)
CSN: [id number]     Arrival date & time 02/24/14  [id number] History   First MD Initiated Contact with Patient 02/24/14 [phone]     Chief Complaint  Patient presents with  . Shortness of Breath     (Consider location/radiation/quality/duration/timing/severity/associated sxs/prior Treatment) Patient is a 28 y.o. female presenting with shortness of breath. The history is provided by the patient.  Shortness of Breath Severity:  Moderate Onset quality:  Gradual Duration:  3 days Timing:  Intermittent Progression:  Worsening Chronicity:  Recurrent Context: URI   Relieved by:  Nothing Worsened by:  Activity and coughing (lying down) Ineffective treatments:  Sitting up (abx and steroids.  did not use her inhaler) Associated symptoms: cough, sore throat, sputum production, vomiting and wheezing   Associated symptoms: no ear pain, no fever and no hemoptysis   Risk factors: tobacco use   Risk factors comment:  Hx of asthma   Past Medical History  Diagnosis Date  . Asthma   . Bronchitis    Past Surgical History  Procedure Laterality Date  . Tooth extraction Right 2008   History reviewed. No pertinent family history. History  Substance Use Topics  . Smoking status: Current Every Day Smoker -- 0.50 packs/day    Types: Cigarettes  . Smokeless tobacco: Never Used  . Alcohol Use: 0.0 oz/week   OB History   Grav Para Term Preterm Abortions TAB SAB Ect Mult Living                 Review of Systems  Constitutional: Negative for fever.  HENT: Positive for sore throat. Negative for ear pain.   Respiratory: Positive for cough, sputum production, shortness of breath and wheezing. Negative for hemoptysis.   Gastrointestinal: Positive for vomiting.  All other systems reviewed and are negative.     Allergies  Oxycodone  Home Medications   Prior to Admission medications   Medication Sig Start Date End Date Taking? Authorizing Provider  albuterol (PROVENTIL HFA;VENTOLIN HFA) 108 (90  BASE) MCG/ACT inhaler Inhale 2 puffs into the lungs every 6 (six) hours as needed for wheezing or shortness of breath.    Historical Provider, MD  HYDROcodone-acetaminophen (NORCO/VICODIN) 5-325 MG per tablet Take 2 tablets by mouth every 6 (six) hours as needed. 11/28/13   Heather Laisure, PA-C  ibuprofen (ADVIL,MOTRIN) 200 MG tablet Take 400 mg by mouth every 6 (six) hours as needed for moderate pain.    Historical Provider, MD  norgestimate-ethinyl estradiol (ORTHO-CYCLEN, 28,) 0.25-35 MG-MCG tablet Take 1 tablet by mouth daily.    Historical Provider, MD   BP 130/65  Pulse 107  Temp(Src) 98.3 F (36.8 C) (Oral)  Resp 18  Ht 5\' 3"  (1.6 m)  Wt 106 lb (48.081 kg)  BMI 18.78 kg/m2  SpO2 100%  LMP 02/03/2014 Physical Exam  Nursing note and vitals reviewed. Constitutional: She is oriented to person, place, and time. She appears well-developed and well-nourished. No distress.  HENT:  Head: Normocephalic and atraumatic.  Right Ear: Tympanic membrane and external ear normal.  Left Ear: Tympanic membrane and external ear normal.  Mouth/Throat: Oropharynx is clear and moist.  Eyes: Conjunctivae and EOM are normal. Pupils are equal, round, and reactive to light. Right eye exhibits no discharge.  Neck: Normal range of motion. Neck supple.  Cardiovascular: Regular rhythm, normal heart sounds and intact distal pulses.  Tachycardia present.   No murmur heard. Pulmonary/Chest: Tachypnea noted. No respiratory distress. She has no decreased breath sounds. She has wheezes. She has no  rhonchi. She has no rales.  Abdominal: Soft. There is no tenderness.  Musculoskeletal: Normal range of motion. She exhibits no edema and no tenderness.  Lymphadenopathy:    She has cervical adenopathy.  Neurological: She is alert and oriented to person, place, and time.  Skin: Skin is warm and dry. No rash noted.  Psychiatric: She has a normal mood and affect.    ED Course  Procedures (including critical care  time) Labs Review Labs Reviewed - No data to display  Imaging Review Dg Chest 2 View  02/24/2014   CLINICAL DATA:  Cough x1 day  EXAM: CHEST  2 VIEW  COMPARISON:  None.  FINDINGS: Normal mediastinum and cardiac silhouette. Normal pulmonary vasculature. No evidence of effusion, infiltrate, or pneumothorax. No acute bony abnormality.  IMPRESSION: Normal chest radiograph   Electronically Signed   By: Genevive Bi M.D.   On: 02/24/2014 08:10     EKG Interpretation None      MDM   Final diagnoses:  None    Pt with typical asthma exacerbation  Symptoms and URI sx.    Productive cough but no fever. Patient was seen in urgent care yesterday and given an azithromycin, prednisone and she states she has an inhaler at home however she has not used any albuterol since her symptoms started.  Wheezing on exam but improved after the first breathing treatment. Chest x-ray pending. Patient states she has a history of pneumonia in the past and is concerned that she may beginning it again. She has had 2 episodes of vomiting this morning after taking the azithromycin. She denies any abdominal pain or diarrhea. No symptoms suggestive of a PE or other chest pathology at this time  8:21 AM Chest x-ray within normal limits. Patient will be sent home with a new inhaler  Gwyneth Sprout, MD 02/24/14 [phone]

## 2014-02-24 NOTE — ED Notes (Signed)
Pt amb to room 7 with quick steady gait in nad. Pt reports feeling sob with cough x yesterday, states this feels like her usual asthma exacerbation. Seen by her pcp yesterday, cont with cough and sob today. Rt at bedside for assessment, aerosol tx initiated.

## 2014-03-16 ENCOUNTER — Emergency Department (HOSPITAL_BASED_OUTPATIENT_CLINIC_OR_DEPARTMENT_OTHER)
Admission: EM | Admit: 2014-03-16 | Discharge: 2014-03-16 | Disposition: A | Discharge status: Home / Self Care | Payer: Medicaid Other | Attending: Emergency Medicine | Admitting: Emergency Medicine

## 2014-03-16 ENCOUNTER — Encounter (HOSPITAL_BASED_OUTPATIENT_CLINIC_OR_DEPARTMENT_OTHER): Payer: Self-pay | Admitting: Emergency Medicine

## 2014-03-16 ENCOUNTER — Emergency Department (HOSPITAL_BASED_OUTPATIENT_CLINIC_OR_DEPARTMENT_OTHER): Payer: Medicaid Other

## 2014-03-16 DIAGNOSIS — Z3A08 8 weeks gestation of pregnancy: Secondary | ICD-10-CM | POA: Diagnosis not present

## 2014-03-16 DIAGNOSIS — N390 Urinary tract infection, site not specified: Secondary | ICD-10-CM | POA: Diagnosis not present

## 2014-03-16 DIAGNOSIS — J45909 Unspecified asthma, uncomplicated: Secondary | ICD-10-CM | POA: Diagnosis not present

## 2014-03-16 DIAGNOSIS — Z79899 Other long term (current) drug therapy: Secondary | ICD-10-CM | POA: Diagnosis not present

## 2014-03-16 DIAGNOSIS — O99511 Diseases of the respiratory system complicating pregnancy, first trimester: Secondary | ICD-10-CM | POA: Diagnosis not present

## 2014-03-16 DIAGNOSIS — O2391 Unspecified genitourinary tract infection in pregnancy, first trimester: Secondary | ICD-10-CM | POA: Diagnosis not present

## 2014-03-16 DIAGNOSIS — O99411 Diseases of the circulatory system complicating pregnancy, first trimester: Secondary | ICD-10-CM | POA: Insufficient documentation

## 2014-03-16 DIAGNOSIS — F17210 Nicotine dependence, cigarettes, uncomplicated: Secondary | ICD-10-CM | POA: Insufficient documentation

## 2014-03-16 DIAGNOSIS — Z79818 Long term (current) use of other agents affecting estrogen receptors and estrogen levels: Secondary | ICD-10-CM | POA: Diagnosis not present

## 2014-03-16 DIAGNOSIS — Z3490 Encounter for supervision of normal pregnancy, unspecified, unspecified trimester: Secondary | ICD-10-CM

## 2014-03-16 DIAGNOSIS — N760 Acute vaginitis: Secondary | ICD-10-CM

## 2014-03-16 DIAGNOSIS — R000 Tachycardia, unspecified: Secondary | ICD-10-CM | POA: Insufficient documentation

## 2014-03-16 DIAGNOSIS — O2341 Unspecified infection of urinary tract in pregnancy, first trimester: Secondary | ICD-10-CM

## 2014-03-16 DIAGNOSIS — O99331 Smoking (tobacco) complicating pregnancy, first trimester: Secondary | ICD-10-CM | POA: Diagnosis not present

## 2014-03-16 DIAGNOSIS — B9689 Other specified bacterial agents as the cause of diseases classified elsewhere: Secondary | ICD-10-CM

## 2014-03-16 LAB — URINALYSIS, ROUTINE W REFLEX MICROSCOPIC
BILIRUBIN URINE: NEGATIVE
Glucose, UA: NEGATIVE mg/dL
Hgb urine dipstick: NEGATIVE
KETONES UR: 15 mg/dL — AB
Nitrite: NEGATIVE
PH: 6 (ref 5.0–8.0)
PROTEIN: NEGATIVE mg/dL
Specific Gravity, Urine: 1.018 (ref 1.005–1.030)
Urobilinogen, UA: 0.2 mg/dL (ref 0.0–1.0)

## 2014-03-16 LAB — BASIC METABOLIC PANEL
Anion gap: 14 (ref 5–15)
BUN: 8 mg/dL (ref 6–23)
CALCIUM: 8.9 mg/dL (ref 8.4–10.5)
CO2: 23 mEq/L (ref 19–32)
Chloride: 100 mEq/L (ref 96–112)
Creatinine, Ser: 0.6 mg/dL (ref 0.50–1.10)
GFR calc Af Amer: >90 mL/min (ref >90)
GLUCOSE: 87 mg/dL (ref 70–99)
Potassium: 3.5 mEq/L — ABNORMAL LOW (ref 3.7–5.3)
Sodium: 137 mEq/L (ref 137–147)

## 2014-03-16 LAB — WET PREP, GENITAL
TRICH WET PREP: NONE SEEN
YEAST WET PREP: NONE SEEN

## 2014-03-16 LAB — T4, FREE: Free T4: 0.99 ng/dL (ref 0.80–1.80)

## 2014-03-16 LAB — CBC WITH DIFFERENTIAL/PLATELET
Basophils Absolute: 0 10*3/uL (ref 0.0–0.1)
Basophils Relative: 0 % (ref 0–1)
EOS ABS: 0.1 10*3/uL (ref 0.0–0.7)
EOS PCT: 1 % (ref 0–5)
HCT: 39 % (ref 36.0–46.0)
Hemoglobin: 12.9 g/dL (ref 12.0–15.0)
LYMPHS ABS: 2.6 10*3/uL (ref 0.7–4.0)
Lymphocytes Relative: 26 % (ref 12–46)
MCH: 31.8 pg (ref 26.0–34.0)
MCHC: 33.1 g/dL (ref 30.0–36.0)
MCV: 96.1 fL (ref 78.0–100.0)
Monocytes Absolute: 0.8 10*3/uL (ref 0.1–1.0)
Monocytes Relative: 8 % (ref 3–12)
Neutro Abs: 6.4 10*3/uL (ref 1.7–7.7)
Neutrophils Relative %: 65 % (ref 43–77)
PLATELETS: 268 10*3/uL (ref 150–400)
RBC: 4.06 MIL/uL (ref 3.87–5.11)
RDW: 13.4 % (ref 11.5–15.5)
WBC: 9.8 10*3/uL (ref 4.0–10.5)

## 2014-03-16 LAB — URINE MICROSCOPIC-ADD ON

## 2014-03-16 LAB — HCG, QUANTITATIVE, PREGNANCY: hCG, Beta Chain, Quant, S: 4316 m[IU]/mL — ABNORMAL HIGH (ref <5)

## 2014-03-16 LAB — ABO/RH: ABO/RH(D): O POS

## 2014-03-16 LAB — TSH: TSH: 1.17 u[IU]/mL (ref 0.350–4.500)

## 2014-03-16 LAB — PREGNANCY, URINE: Preg Test, Ur: POSITIVE — AB

## 2014-03-16 MED ORDER — SODIUM CHLORIDE 0.9 % IV BOLUS (SEPSIS)
1000.0000 mL | Freq: Once | INTRAVENOUS | Status: AC
  Administered 2014-03-16: 1000 mL via INTRAVENOUS

## 2014-03-16 MED ORDER — CEPHALEXIN 500 MG PO CAPS: 1000.0000 mg | ORAL_CAPSULE | Freq: Two times a day (BID) | ORAL | Status: DC

## 2014-03-16 MED ORDER — METRONIDAZOLE 0.75 % VA GEL: 1.0000 | Freq: Two times a day (BID) | VAGINAL | Status: AC

## 2014-03-16 NOTE — Discharge Instructions (Signed)
You have been diagnosed with pregnancy, likely before 5 weeks.  Please follow up with your OBGYN in 2 days for further evaluation.  You have a urinary tract infection and bacterial vaginosis.  Take antibiotics as prescribed.  Your heart rate is fast, please call our hospital in a few days to check up on your thyroid lab test.  You will need to have a primary care provider to read the test and make decision in accordance to the result.    Asymptomatic Bacteriuria Asymptomatic bacteriuria is the presence of a large number of bacteria in your urine without the usual symptoms of burning or frequent urination. The following conditions increase the risk of asymptomatic bacteriuria:  Diabetes mellitus.  Advanced age.  Pregnancy in the first trimester.  Kidney stones.  Kidney transplants.  Leaky kidney tube valve in young children (reflux). Treatment for this condition is not needed in most people and can lead to other problems such as too much yeast and growth of resistant bacteria. However, some people, such as pregnant women, do need treatment to prevent kidney infection. Asymptomatic bacteriuria in pregnancy is also associated with fetal growth restriction, premature labor, and newborn death. HOME CARE INSTRUCTIONS Monitor your condition for any changes. The following actions may help to relieve any discomfort you are feeling:  Drink enough water and fluids to keep your urine clear or pale yellow. Go to the bathroom more often to keep your bladder empty.  Keep the area around your vagina and rectum clean. Wipe yourself from front to back after urinating. SEEK IMMEDIATE MEDICAL CARE IF:  You develop signs of an infection such as:  Burning with urination.  Frequency of voiding.  Back pain.  Fever.  You have blood in the urine.  You develop a fever. MAKE SURE YOU:  Understand these instructions.  Will watch your condition.  Will get help right away if you are not doing well or  get worse. Document Released: 05/12/2005 Document Revised: 09/26/2013 Document Reviewed: 11/01/2012 [facility] Patient Information 2015 [place], [place]. This information is not intended to replace advice given to you by your health care provider. Make sure you discuss any questions you have with your health care provider.  Bacterial Vaginosis Bacterial vaginosis is a vaginal infection that occurs when the normal balance of bacteria in the vagina is disrupted. It results from an overgrowth of certain bacteria. This is the most common vaginal infection in women of childbearing age. Treatment is important to prevent complications, especially in pregnant women, as it can cause a premature delivery. CAUSES  Bacterial vaginosis is caused by an increase in harmful bacteria that are normally present in smaller amounts in the vagina. Several different kinds of bacteria can cause bacterial vaginosis. However, the reason that the condition develops is not fully understood. RISK FACTORS Certain activities or behaviors can put you at an increased risk of developing bacterial vaginosis, including:  Having a new sex partner or multiple sex partners.  Douching.  Using an intrauterine device (IUD) for contraception. Women do not get bacterial vaginosis from toilet seats, bedding, swimming pools, or contact with objects around them. SIGNS AND SYMPTOMS  Some women with bacterial vaginosis have no signs or symptoms. Common symptoms include:  Grey vaginal discharge.  A fishlike odor with discharge, especially after sexual intercourse.  Itching or burning of the vagina and vulva.  Burning or pain with urination. DIAGNOSIS  Your health care provider will take a medical history and examine the vagina for signs of bacterial vaginosis.  A sample of vaginal fluid may be taken. Your health care provider will look at this sample under a microscope to check for bacteria and abnormal cells. A vaginal pH test may also be  done.  TREATMENT  Bacterial vaginosis may be treated with antibiotic medicines. These may be given in the form of a pill or a vaginal cream. A second round of antibiotics may be prescribed if the condition comes back after treatment.  HOME CARE INSTRUCTIONS   Only take over-the-counter or prescription medicines as directed by your health care provider.  If antibiotic medicine was prescribed, take it as directed. Make sure you finish it even if you start to feel better.  Do not have sex until treatment is completed.  Tell all sexual partners that you have a vaginal infection. They should see their health care provider and be treated if they have problems, such as a mild rash or itching.  Practice safe sex by using condoms and only having one sex partner. SEEK MEDICAL CARE IF:   Your symptoms are not improving after 3 days of treatment.  You have increased discharge or pain.  You have a fever. MAKE SURE YOU:   Understand these instructions.  Will watch your condition.  Will get help right away if you are not doing well or get worse. FOR MORE INFORMATION  Centers for Disease Control and Prevention, Division of STD Prevention: SolutionApps.co.zawww.cdc.gov/std American Sexual Health Association (ASHA): www.ashastd.org  Document Released: 05/12/2005 Document Revised: 03/02/2013 Document Reviewed: 12/22/2012 [facility] Patient Information 2015 [place], [place]. This information is not intended to replace advice given to you by your health care provider. Make sure you discuss any questions you have with your health care provider.  First Trimester of Pregnancy The first trimester of pregnancy is from week 1 until the end of week 12 (months 1 through 3). During this time, your baby will begin to develop inside you. At 6-8 weeks, the eyes and face are formed, and the heartbeat can be seen on ultrasound. At the end of 12 weeks, all the baby's organs are formed. Prenatal care is all the medical care you receive  before the birth of your baby. Make sure you get good prenatal care and follow all of your doctor's instructions. HOME CARE  Medicines  Take medicine only as told by your doctor. Some medicines are safe and some are not during pregnancy.  Take your prenatal vitamins as told by your doctor.  Take medicine that helps you poop (stool softener) as needed if your doctor says it is okay. Diet  Eat regular, healthy meals.  Your doctor will tell you the amount of weight gain that is right for you.  Avoid raw meat and uncooked cheese.  If you feel sick to your stomach (nauseous) or throw up (vomit):  Eat 4 or 5 small meals a day instead of 3 large meals.  Try eating a few soda crackers.  Drink liquids between meals instead of during meals.  If you have a hard time pooping (constipation):  Eat high-fiber foods like fresh vegetables, fruit, and whole grains.  Drink enough fluids to keep your pee (urine) clear or pale yellow. Activity and Exercise  Exercise only as told by your doctor. Stop exercising if you have cramps or pain in your lower belly (abdomen) or low back.  Try to avoid standing for long periods of time. Move your legs often if you must stand in one place for a long time.  Avoid heavy lifting.  Wear low-heeled shoes. Sit and stand up straight.  You can have sex unless your doctor tells you not to. Relief of Pain or Discomfort  Wear a good support bra if your breasts are sore.  Take warm water baths (sitz baths) to soothe pain or discomfort caused by hemorrhoids. Use hemorrhoid cream if your doctor says it is okay.  Rest with your legs raised if you have leg cramps or low back pain.  Wear support hose if you have puffy, bulging veins (varicose veins) in your legs. Raise (elevate) your feet for 15 minutes, 3-4 times a day. Limit salt in your diet. Prenatal Care  Schedule your prenatal visits by the twelfth week of pregnancy.  Write down your questions. Take them  to your prenatal visits.  Keep all your prenatal visits as told by your doctor. Safety  Wear your seat belt at all times when driving.  Make a list of emergency phone numbers. The list should include numbers for family, friends, the hospital, and police and fire departments. General Tips  Ask your doctor for a referral to a local prenatal class. Begin classes no later than at the start of month 6 of your pregnancy.  Ask for help if you need counseling or help with nutrition. Your doctor can give you advice or tell you where to go for help.  Do not use hot tubs, steam rooms, or saunas.  Do not douche or use tampons or scented sanitary pads.  Do not cross your legs for long periods of time.  Avoid litter boxes and soil used by cats.  Avoid all smoking, herbs, and alcohol. Avoid drugs not approved by your doctor.  Visit your dentist. At home, brush your teeth with a soft toothbrush. Be gentle when you floss. GET HELP IF:  You are dizzy.  You have mild cramps or pressure in your lower belly.  You have a nagging pain in your belly area.  You continue to feel sick to your stomach, throw up, or have watery poop (diarrhea).  You have a bad smelling fluid coming from your vagina.  You have pain with peeing (urination).  You have increased puffiness (swelling) in your face, hands, legs, or ankles. GET HELP RIGHT AWAY IF:   You have a fever.  You are leaking fluid from your vagina.  You have spotting or bleeding from your vagina.  You have very bad belly cramping or pain.  You gain or lose weight rapidly.  You throw up blood. It may look like coffee grounds.  You are around people who have [place] measles, fifth disease, or chickenpox.  You have a very bad headache.  You have shortness of breath.  You have any kind of trauma, such as from a fall or a car accident. Document Released: 10/29/2007 Document Revised: 09/26/2013 Document Reviewed: 03/22/2013 [facility]  Patient Information 2015 [place], [place]. This information is not intended to replace advice given to you by your health care provider. Make sure you discuss any questions you have with your health care provider.

## 2014-03-16 NOTE — ED Provider Notes (Signed)
Medical screening examination/treatment/procedure(s) were conducted as a shared visit with non-physician practitioner(s) and myself.  I personally evaluated the patient during the encounter.   EKG Interpretation   Date/Time:  Thursday March 16 2014 15:10:27 EDT Ventricular Rate:  106 PR Interval:  136 QRS Duration: 82 QT Interval:  344 QTC Calculation: 456 R Axis:   87 Text Interpretation:  Sinus tachycardia Otherwise normal ECG No previous  ECGs available Confirmed by YAO  MD, DAVID ([id number]) on 03/16/2014 3:16:10  PM      Stasia Cavalierlizabeth Levingston is a 28 y.o. female here with palpitations. Was going to donate plasma but was too tachycardic. Denies chest pain or shortness of breath. LMP was a month ago. UCG positive. Lung exam unremarkable. Abdomen soft, nontender. Pelvic exam done by PA. I performed transvag US that showed double decidual sign. No obvious ectopic. HCG 4316. Wet prep + clue cells. UA + UTI. Dc with metro gel and keflex. Told her to get HCG check in 2 days.   EMERGENCY DEPARTMENT US PREGNANCY "Study: Limited Ultrasound of the Pelvis"  INDICATIONS:Pregnancy(required) Multiple views of the uterus and pelvic cavity are obtained with a multi-frequency probe.  APPROACH:Transvaginal  PERFORMED BY: Myself  IMAGES ARCHIVED?: Yes  LIMITATIONS: Emergent procedure  PREGNANCY FREE FLUID: None and Small  PREGNANCY UTERUS FINDINGS:Uterus enlarged and Gestational sac noted ADNEXAL FINDINGS:Right ovarion cyst  PREGNANCY FINDINGS: Intrauterine gestational sac noted  INTERPRETATION: Viable intrauterine pregnancy  GESTATIONAL AGE, ESTIMATE: 5 weeks  FETAL HEART RATE: unable, able to see gestational sac   COMMENT(Estimate of Gestational Age):        Richardean Canalavid H Yao, MD 03/16/14 [id number]

## 2014-03-16 NOTE — ED Provider Notes (Signed)
CSN: [id number]     Arrival date & time 03/16/14  1453 History   First MD Initiated Contact with Patient 03/16/14 1511     Chief Complaint  Patient presents with  . Tachycardia     (Consider location/radiation/quality/duration/timing/severity/associated sxs/prior Treatment) HPI  28 year old female with history of asthma bronchitis and was [facility] for evaluation tachycardia.  Patient has been donated plasma 7 times since July. Patient reported each time that she was there, her heart rate has been fast between 110s to 130s.  Today before donating plasma, she was recommended to go to the ER for further evaluation of her tachycardia. Patient admits that she had had fast heart rate sometime more than usual. She reports feeling anxious when her heart is racing; she was diagnosed with anxiety when she was 10. She does not know of any precipitating symptoms for her tachycardia. Report having insomnia as well. No complaints of fever, headache, trouble swallowing, chest pain, shortness of breath, dyspnea on exertion, nausea vomiting diarrhea, abdominal pain or back pain. Her stress level has been normal. No prior history of PE or DVT. No recent surgery, prolonged bed rest, unilalteral leg swelling or calf pain.  She discontinued taking her birth control pill in August. She has not had a menstrual period since. She is sexually active. No prior history of thyroid disease. Does not drink coffee or other stimulants regularly.  Does not have a PCP.  Past Medical History  Diagnosis Date  . Asthma   . Bronchitis    Past Surgical History  Procedure Laterality Date  . Tooth extraction Right 2008   No family history on file. History  Substance Use Topics  . Smoking status: Current Every Day Smoker -- 0.50 packs/day    Types: Cigarettes  . Smokeless tobacco: Never Used  . Alcohol Use: 0.0 oz/week   OB History   Grav Para Term Preterm Abortions TAB SAB Ect  Mult Living                 Review of Systems  All other systems reviewed and are negative.     Allergies  Oxycodone  Home Medications   Prior to Admission medications   Medication Sig Start Date End Date Taking? Authorizing Provider  albuterol (PROVENTIL HFA;VENTOLIN HFA) 108 (90 BASE) MCG/ACT inhaler Inhale 2 puffs into the lungs every 6 (six) hours as needed for wheezing or shortness of breath.    Historical Provider, MD  HYDROcodone-acetaminophen (NORCO/VICODIN) 5-325 MG per tablet Take 2 tablets by mouth every 6 (six) hours as needed. 11/28/13   Heather Laisure, PA-C  ibuprofen (ADVIL,MOTRIN) 200 MG tablet Take 400 mg by mouth every 6 (six) hours as needed for moderate pain.    Historical Provider, MD  norgestimate-ethinyl estradiol (ORTHO-CYCLEN, 28,) 0.25-35 MG-MCG tablet Take 1 tablet by mouth daily.    Historical Provider, MD   BP 106/56  Pulse 110  Temp(Src) 98.9 F (37.2 C) (Oral)  Resp 20  Ht 5\' 3"  (1.6 m)  Wt 120 lb (54.432 kg)  BMI 21.26 kg/m2  SpO2 99%  LMP 02/03/2014 Physical Exam  Nursing note and vitals reviewed. Constitutional: She is oriented to person, place, and time. She appears well-developed and well-nourished. No distress.  HENT:  Head: Atraumatic.  Mouth/Throat: Oropharynx is clear and moist.  Eyes: Conjunctivae are normal.  Neck: Neck supple.  No goiter  Cardiovascular: Intact distal pulses.   Tachycardia without murmur rubs or gallop  Pulmonary/Chest: Effort  normal and breath sounds normal. No respiratory distress. She has no wheezes. She exhibits no tenderness.  Abdominal: Soft. She exhibits no distension. There is no tenderness.  Nongravid abdomen  Genitourinary:  Chaperone present:  No inguinal lymphadenopathy. Normal external vaginal region. Vaginal vault with normal functional vaginal discharge. Cervical os with normal appearance, os is closed. No vaginal bleeding. On bimanual exam, no adnexal tenderness or cervical motion tenderness.   Musculoskeletal: She exhibits no edema.  Neurological: She is alert and oriented to person, place, and time.  Skin: Skin is warm. No rash noted.  Normal skin turgor, no dry or oily hair or dry/oily skin  Psychiatric: She has a normal mood and affect.    ED Course  Procedures (including critical care time)  3:43 PM Patient with persistent baseline tachycardia, sent here from plasmapheresis program for further investigation of her tachycardia. Although she is tachycardic she has no significant risk factors concerning for PE. EKG shows sinus tach. Suspect thryroid disease as underlying cause.  Will check TSH and Free T4.  Pt will need to call in the next couple of days to check level.  Will also provide resources so pt can find PCP for f/u. Work up initiated.  IVF given. Care discussed with Dr. Silverio LayYao.    4:08 PM Pregnancy test is positive. Patient states her last normal menstrual period was in August. She also reports she had a light menstrual bleeding on September 11 lasting for 3 days. She is a G4 P0 with 4 separate miscarriage, with pregnancy lasting no more than 7-8 weeks. No prior history of STD. Patient denies having any significant abdominal pain, vaginal bleeding, vaginal discharge or dysuria. She does request for an ultrasound to assess her pregnancy. I will also perform pelvic exam to assess for any STDs which may complicate her pregnancy. she does notice increased weight gain, and nipple sensitivity without nipple discharge  4:31 PM Patient with apparent intrauterine pregnancy based on bedside ultrasound performed by Dr. Silverio LayYao. No significant discomfort or abnormal vaginal discharge on pelvic examination.   Evidence of BV, will treat.  Asymptomatic UTI in pregnancy, will treat as well.  K+ 3.5, supplementation given. Her thyroid panel including TSH and Free T4 is normal.  Pt will f/u with OBGYN for further management of her pregnancy.     Labs Review Labs Reviewed  WET PREP, GENITAL -  Abnormal; Notable for the following:    Clue Cells Wet Prep HPF POC MODERATE (*)    WBC, Wet Prep HPF POC FEW (*)    All other components within normal limits  BASIC METABOLIC PANEL - Abnormal; Notable for the following:    Potassium 3.5 (*)    All other components within normal limits  PREGNANCY, URINE - Abnormal; Notable for the following:    Preg Test, Ur POSITIVE (*)    All other components within normal limits  HCG, QUANTITATIVE, PREGNANCY - Abnormal; Notable for the following:    hCG, Beta Chain, Quant, S 4316 (*)    All other components within normal limits  URINALYSIS, ROUTINE W REFLEX MICROSCOPIC - Abnormal; Notable for the following:    APPearance CLOUDY (*)    Ketones, ur 15 (*)    Leukocytes, UA MODERATE (*)    All other components within normal limits  URINE MICROSCOPIC-ADD ON - Abnormal; Notable for the following:    Squamous Epithelial / LPF MANY (*)    Bacteria, UA FEW (*)    All other components within normal limits  GC/CHLAMYDIA PROBE AMP  CBC WITH DIFFERENTIAL  TSH  T4, FREE  ABO/RH    Imaging Review No results found.   EKG Interpretation   Date/Time:  Thursday March 16 2014 15:10:27 EDT Ventricular Rate:  106 PR Interval:  136 QRS Duration: 82 QT Interval:  344 QTC Calculation: 456 R Axis:   87 Text Interpretation:  Sinus tachycardia Otherwise normal ECG No previous  ECGs available Confirmed by YAO  MD, DAVID ([id number]) on 03/16/2014 3:16:10  PM      MDM   Final diagnoses:  Pregnancy  Tachycardia  UTI in pregnancy, first trimester  BV (bacterial vaginosis)    BP 103/57  Pulse 102  Temp(Src) 98.2 F (36.8 C) (Oral)  Resp 16  Ht 5\' 3"  (1.6 m)  Wt 120 lb (54.432 kg)  BMI 21.26 kg/m2  SpO2 100%  LMP 02/03/2014     Fayrene Helper, PA-C 03/16/14 2153

## 2014-03-16 NOTE — ED Notes (Signed)
States her heart is beating fast. She was not able to donate plasma due to tachycardia. Staff recommended she be seen at an ED to find the cause. States she feels like her heart rate is always fast.

## 2014-03-17 LAB — GC/CHLAMYDIA PROBE AMP
CT Probe RNA: NEGATIVE
GC PROBE AMP APTIMA: NEGATIVE

## 2014-04-01 ENCOUNTER — Emergency Department (HOSPITAL_BASED_OUTPATIENT_CLINIC_OR_DEPARTMENT_OTHER): Payer: Medicaid Other

## 2014-04-01 ENCOUNTER — Emergency Department (HOSPITAL_BASED_OUTPATIENT_CLINIC_OR_DEPARTMENT_OTHER)
Admission: EM | Admit: 2014-04-01 | Discharge: 2014-04-01 | Disposition: A | Discharge status: Home / Self Care | Payer: Medicaid Other | Attending: Emergency Medicine | Admitting: Emergency Medicine

## 2014-04-01 ENCOUNTER — Encounter (HOSPITAL_BASED_OUTPATIENT_CLINIC_OR_DEPARTMENT_OTHER): Payer: Self-pay

## 2014-04-01 DIAGNOSIS — O26891 Other specified pregnancy related conditions, first trimester: Secondary | ICD-10-CM | POA: Insufficient documentation

## 2014-04-01 DIAGNOSIS — Z79818 Long term (current) use of other agents affecting estrogen receptors and estrogen levels: Secondary | ICD-10-CM | POA: Diagnosis not present

## 2014-04-01 DIAGNOSIS — F17200 Nicotine dependence, unspecified, uncomplicated: Secondary | ICD-10-CM | POA: Insufficient documentation

## 2014-04-01 DIAGNOSIS — J45909 Unspecified asthma, uncomplicated: Secondary | ICD-10-CM | POA: Insufficient documentation

## 2014-04-01 DIAGNOSIS — O99511 Diseases of the respiratory system complicating pregnancy, first trimester: Secondary | ICD-10-CM | POA: Insufficient documentation

## 2014-04-01 DIAGNOSIS — O99331 Smoking (tobacco) complicating pregnancy, first trimester: Secondary | ICD-10-CM | POA: Diagnosis not present

## 2014-04-01 DIAGNOSIS — Z3A08 8 weeks gestation of pregnancy: Secondary | ICD-10-CM | POA: Insufficient documentation

## 2014-04-01 DIAGNOSIS — Z792 Long term (current) use of antibiotics: Secondary | ICD-10-CM | POA: Diagnosis not present

## 2014-04-01 DIAGNOSIS — R1084 Generalized abdominal pain: Secondary | ICD-10-CM | POA: Diagnosis not present

## 2014-04-01 DIAGNOSIS — Z79899 Other long term (current) drug therapy: Secondary | ICD-10-CM | POA: Diagnosis not present

## 2014-04-01 DIAGNOSIS — O26899 Other specified pregnancy related conditions, unspecified trimester: Secondary | ICD-10-CM

## 2014-04-01 DIAGNOSIS — R109 Unspecified abdominal pain: Secondary | ICD-10-CM

## 2014-04-01 LAB — URINALYSIS, ROUTINE W REFLEX MICROSCOPIC
Bilirubin Urine: NEGATIVE
GLUCOSE, UA: NEGATIVE mg/dL
Hgb urine dipstick: NEGATIVE
Ketones, ur: NEGATIVE mg/dL
Nitrite: NEGATIVE
PH: 6.5 (ref 5.0–8.0)
Protein, ur: NEGATIVE mg/dL
SPECIFIC GRAVITY, URINE: 1.022 (ref 1.005–1.030)
Urobilinogen, UA: 0.2 mg/dL (ref 0.0–1.0)

## 2014-04-01 LAB — CBC WITH DIFFERENTIAL/PLATELET
Basophils Absolute: 0 10*3/uL (ref 0.0–0.1)
Basophils Relative: 0 % (ref 0–1)
EOS ABS: 0.1 10*3/uL (ref 0.0–0.7)
Eosinophils Relative: 1 % (ref 0–5)
HCT: 40.7 % (ref 36.0–46.0)
Hemoglobin: 13.8 g/dL (ref 12.0–15.0)
LYMPHS PCT: 22 % (ref 12–46)
Lymphs Abs: 2.2 10*3/uL (ref 0.7–4.0)
MCH: 32.5 pg (ref 26.0–34.0)
MCHC: 33.9 g/dL (ref 30.0–36.0)
MCV: 95.8 fL (ref 78.0–100.0)
MONOS PCT: 8 % (ref 3–12)
Monocytes Absolute: 0.8 10*3/uL (ref 0.1–1.0)
Neutro Abs: 7 10*3/uL (ref 1.7–7.7)
Neutrophils Relative %: 69 % (ref 43–77)
PLATELETS: 244 10*3/uL (ref 150–400)
RBC: 4.25 MIL/uL (ref 3.87–5.11)
RDW: 13 % (ref 11.5–15.5)
WBC: 10.2 10*3/uL (ref 4.0–10.5)

## 2014-04-01 LAB — BASIC METABOLIC PANEL
Anion gap: 14 (ref 5–15)
BUN: 9 mg/dL (ref 6–23)
CO2: 23 mEq/L (ref 19–32)
Calcium: 9.7 mg/dL (ref 8.4–10.5)
Chloride: 102 mEq/L (ref 96–112)
Creatinine, Ser: 0.5 mg/dL (ref 0.50–1.10)
Glucose, Bld: 94 mg/dL (ref 70–99)
POTASSIUM: 4.2 meq/L (ref 3.7–5.3)
SODIUM: 139 meq/L (ref 137–147)

## 2014-04-01 LAB — URINE MICROSCOPIC-ADD ON

## 2014-04-01 LAB — HCG, QUANTITATIVE, PREGNANCY: HCG, BETA CHAIN, QUANT, S: 55991 m[IU]/mL — AB (ref <5)

## 2014-04-01 LAB — PREGNANCY, URINE: Preg Test, Ur: POSITIVE — AB

## 2014-04-01 MED ORDER — METRONIDAZOLE 500 MG PO TABS: 500.0000 mg | ORAL_TABLET | Freq: Two times a day (BID) | ORAL | Status: DC

## 2014-04-01 MED ORDER — PROMETHAZINE HCL 25 MG PO TABS: 12.5000 mg | ORAL_TABLET | Freq: Four times a day (QID) | ORAL | Status: DC | PRN

## 2014-04-01 NOTE — Discharge Instructions (Signed)
Use Unisom and Vitamin B6 for for nausea and to help you rest. If you nausea does not resolve you can try the Phenergan. Call to start your prenatal care. Take the medication as directed for your bacterial infection.

## 2014-04-01 NOTE — ED Provider Notes (Signed)
CSN: [id number]     Arrival date & time 04/01/14  1520 History   First MD Initiated Contact with Patient 04/01/14 1606     Chief Complaint  Patient presents with  . Abdominal Pain     (Consider location/radiation/quality/duration/timing/severity/associated sxs/prior Treatment) Patient is a 28 y.o. female presenting with abdominal pain. The history is provided by the patient.  Abdominal Pain Pain location:  Generalized Associated symptoms: nausea    Diane Stein is a 28 y.o. G1P0 who presents to the ED with abdominal pain. She was evaluated here on 9/22 for tachycardia and had a positive pregnancy test at that time. She reports stopping her birth control in August and having a very light 3 days period that started 9/11. She has not started prenatal care. Patient states that she was given Rx for BV on last visit but could not afford the medication. She request medication that is less expensive than Metrogel. She also request medication for nausea. [facility]. She describes her pain as feeling like she is having cramps with her period.   Past Medical History  Diagnosis Date  . Asthma   . Bronchitis    Past Surgical History  Procedure Laterality Date  . Tooth extraction Right 2008   History reviewed. No pertinent family history. History  Substance Use Topics  . Smoking status: Current Every Day Smoker -- 0.50 packs/day    Types: Cigarettes  . Smokeless tobacco: Never Used  . Alcohol Use: 0.0 oz/week   OB History    Gravida Para Term Preterm AB TAB SAB Ectopic Multiple Living   1              Review of Systems  Gastrointestinal: Positive for nausea and abdominal pain.  all other systems negative    Allergies  Oxycodone and Codeine  Home Medications   Prior to Admission medications   Medication Sig Start Date End Date Taking? Authorizing Provider  albuterol (PROVENTIL HFA;VENTOLIN HFA) 108 (90 BASE) MCG/ACT inhaler  Inhale 2 puffs into the lungs every 6 (six) hours as needed for wheezing or shortness of breath.   Yes Historical Provider, MD  cephALEXin (KEFLEX) 500 MG capsule Take 2 capsules (1,000 mg total) by mouth 2 (two) times daily. 03/16/14  Yes Fayrene HelperBowie Tran, PA-C  HYDROcodone-acetaminophen (NORCO/VICODIN) 5-325 MG per tablet Take 2 tablets by mouth every 6 (six) hours as needed. 11/28/13   Heather Laisure, PA-C  ibuprofen (ADVIL,MOTRIN) 200 MG tablet Take 400 mg by mouth every 6 (six) hours as needed for moderate pain.    Historical Provider, MD  norgestimate-ethinyl estradiol (ORTHO-CYCLEN, 28,) 0.25-35 MG-MCG tablet Take 1 tablet by mouth daily.    Historical Provider, MD   BP 122/62 mmHg  Pulse 101  Temp(Src) 98 F (36.7 C) (Oral)  Resp 20  Ht 5\' 3"  (1.6 m)  Wt 120 lb (54.432 kg)  BMI 21.26 kg/m2  SpO2 99%  LMP 02/02/2014 Physical Exam  Constitutional: She is oriented to person, place, and time. She appears well-developed and well-nourished.  HENT:  Head: Normocephalic.  Eyes: EOM are normal.  Neck: Neck supple.  Cardiovascular: Normal rate.   Pulmonary/Chest: Effort normal.  Abdominal: Soft. Bowel sounds are normal. There is no tenderness.  Unable to reproduce the cramping pain that patient has had.   Genitourinary:  Done on last visit with cultures, not repeated.   Musculoskeletal: Normal range of motion.  Neurological: She is alert and oriented to person, place, and  time. No cranial nerve deficit.  Skin: Skin is warm and dry.  Psychiatric: She has a normal mood and affect. Her behavior is normal.  Nursing note and vitals reviewed.   ED Course  Procedures (including critical care time) Labs Review Results for orders placed or performed during the hospital encounter of 04/01/14 (from the past 24 hour(s))  Urinalysis, Routine w reflex microscopic     Status: Abnormal   Collection Time: 04/01/14  3:31 PM  Result Value Ref Range   Color, Urine YELLOW YELLOW   APPearance CLOUDY (A)  CLEAR   Specific Gravity, Urine 1.022 1.005 - 1.030   pH 6.5 5.0 - 8.0   Glucose, UA NEGATIVE NEGATIVE mg/dL   Hgb urine dipstick NEGATIVE NEGATIVE   Bilirubin Urine NEGATIVE NEGATIVE   Ketones, ur NEGATIVE NEGATIVE mg/dL   Protein, ur NEGATIVE NEGATIVE mg/dL   Urobilinogen, UA 0.2 0.0 - 1.0 mg/dL   Nitrite NEGATIVE NEGATIVE   Leukocytes, UA MODERATE (A) NEGATIVE  Pregnancy, urine     Status: Abnormal   Collection Time: 04/01/14  3:31 PM  Result Value Ref Range   Preg Test, Ur POSITIVE (A) NEGATIVE  Urine microscopic-add on     Status: Abnormal   Collection Time: 04/01/14  3:31 PM  Result Value Ref Range   Squamous Epithelial / LPF MANY (A) RARE   WBC, UA 3-6 <3 WBC/hpf   Bacteria, UA MANY (A) RARE   Urine-Other MUCOUS PRESENT   Basic metabolic panel     Status: None   Collection Time: 04/01/14  4:20 PM  Result Value Ref Range   Sodium 139 137 - 147 mEq/L   Potassium 4.2 3.7 - 5.3 mEq/L   Chloride 102 96 - 112 mEq/L   CO2 23 19 - 32 mEq/L   Glucose, Bld 94 70 - 99 mg/dL   BUN 9 6 - 23 mg/dL   Creatinine, Ser 1.61 0.50 - 1.10 mg/dL   Calcium 9.7 8.4 - 09.6 mg/dL   GFR calc non Af Amer >90 >90 mL/min   GFR calc Af Amer >90 >90 mL/min   Anion gap 14 5 - 15  CBC with Differential     Status: None   Collection Time: 04/01/14  4:20 PM  Result Value Ref Range   WBC 10.2 4.0 - 10.5 K/uL   RBC 4.25 3.87 - 5.11 MIL/uL   Hemoglobin 13.8 12.0 - 15.0 g/dL   HCT 04.5 40.9 - 81.1 %   MCV 95.8 78.0 - 100.0 fL   MCH 32.5 26.0 - 34.0 pg   MCHC 33.9 30.0 - 36.0 g/dL   RDW 91.4 78.2 - 95.6 %   Platelets 244 150 - 400 K/uL   Neutrophils Relative % 69 43 - 77 %   Neutro Abs 7.0 1.7 - 7.7 K/uL   Lymphocytes Relative 22 12 - 46 %   Lymphs Abs 2.2 0.7 - 4.0 K/uL   Monocytes Relative 8 3 - 12 %   Monocytes Absolute 0.8 0.1 - 1.0 K/uL   Eosinophils Relative 1 0 - 5 %   Eosinophils Absolute 0.1 0.0 - 0.7 K/uL   Basophils Relative 0 0 - 1 %   Basophils Absolute 0.0 0.0 - 0.1 K/uL   US Ob  Comp Less 14 Wks  04/01/2014   CLINICAL DATA:  Pregnant patient with midline abdominal and pelvic cramping for 2 weeks. No vaginal bleeding. Patient has a history of 4 miscarriages. No live births. Patient 8 weeks and 2 days pregnant  based on her last menstrual period.  EXAM: OBSTETRIC <14 WK [place] AND TRANSVAGINAL OB US  TECHNIQUE: Both transabdominal and transvaginal ultrasound examinations were performed for complete evaluation of the gestation as well as the maternal uterus, adnexal regions, and pelvic cul-de-sac. Transvaginal technique was performed to assess early pregnancy.  COMPARISON:  None.  FINDINGS: Intrauterine gestational sac: Visualized/normal in shape.  Yolk sac:  Yes  Embryo:  Yes  Cardiac Activity: Yes  Heart Rate:  150 bpm  CRL:   12.8  mm   7 w 4 d                  [place] EDC: 11/14/2014  Maternal uterus/adnexae: Possible small subchronic hemorrhage to the right of the gestational sac. No uterine mass. Uterus may be articular mainly septate. Cervix is closed. Mildly complex cyst in the right ovary likely a hemorrhagic corpus luteum. Ovary is normal in size. No adnexal masses. No pelvic free fluid.  IMPRESSION: 1. Single live intrauterine pregnancy with a measured gestational age of [redacted] weeks and 4 days. Possible small subchronic hemorrhage. This is equivocal. No other evidence of a pregnancy complication. 2. Question abnormal morphology of the uterus which could be septate or arcuate. 3. Ovary is unremarkable.  No adnexal masses or free fluid.   Electronically Signed   By: Amie Portland M.D.   On: 04/01/2014 17:39   US Ob Transvaginal  04/01/2014   CLINICAL DATA:  Pregnant patient with midline abdominal and pelvic cramping for 2 weeks. No vaginal bleeding. Patient has a history of 4 miscarriages. No live births. Patient 8 weeks and 2 days pregnant based on her last menstrual period.  EXAM: OBSTETRIC <14 WK [place] AND TRANSVAGINAL OB US  TECHNIQUE: Both transabdominal and transvaginal ultrasound  examinations were performed for complete evaluation of the gestation as well as the maternal uterus, adnexal regions, and pelvic cul-de-sac. Transvaginal technique was performed to assess early pregnancy.  COMPARISON:  None.  FINDINGS: Intrauterine gestational sac: Visualized/normal in shape.  Yolk sac:  Yes  Embryo:  Yes  Cardiac Activity: Yes  Heart Rate:  150 bpm  CRL:   12.8  mm   7 w 4 d                  [place] EDC: 11/14/2014  Maternal uterus/adnexae: Possible small subchronic hemorrhage to the right of the gestational sac. No uterine mass. Uterus may be articular mainly septate. Cervix is closed. Mildly complex cyst in the right ovary likely a hemorrhagic corpus luteum. Ovary is normal in size. No adnexal masses. No pelvic free fluid.  IMPRESSION: 1. Single live intrauterine pregnancy with a measured gestational age of [redacted] weeks and 4 days. Possible small subchronic hemorrhage. This is equivocal. No other evidence of a pregnancy complication. 2. Question abnormal morphology of the uterus which could be septate or arcuate. 3. Ovary is unremarkable.  No adnexal masses or free fluid.   Electronically Signed   By: Amie Portland M.D.   On: 04/01/2014 17:39    MDM  28 y.o. female @ 7 weeks 4 days gestation with lower abdominal cramping that comes and goes. I have reviewed this patient's vital signs, nurses notes, appropriate labs and imaging.  I have discussed findings and plan of care with the patient. She voices understanding and agrees with plan.she will use Vit. B6 and Unisom for nausea. Will give flagyl for BV that she had on last visit and could not afford the medication she was given  at that time.  She will follow up with her OB to start prenatal care. Pregnancy verification letter given to the patient.    Medication List    TAKE these medications        metroNIDAZOLE 500 MG tablet  Commonly known as:  FLAGYL  Take 1 tablet (500 mg total) by mouth 2 (two) times daily.     promethazine 25 MG tablet   Commonly known as:  PHENERGAN  Take 0.5 tablets (12.5 mg total) by mouth every 6 (six) hours as needed for nausea or vomiting.      ASK your doctor about these medications        albuterol 108 (90 BASE) MCG/ACT inhaler  Commonly known as:  PROVENTIL HFA;VENTOLIN HFA  Inhale 2 puffs into the lungs every 6 (six) hours as needed for wheezing or shortness of breath.     cephALEXin 500 MG capsule  Commonly known as:  KEFLEX  Take 2 capsules (1,000 mg total) by mouth 2 (two) times daily.     HYDROcodone-acetaminophen 5-325 MG per tablet  Commonly known as:  NORCO/VICODIN  Take 2 tablets by mouth every 6 (six) hours as needed.     ibuprofen 200 MG tablet  Commonly known as:  ADVIL,MOTRIN  Take 400 mg by mouth every 6 (six) hours as needed for moderate pain.     ORTHO-CYCLEN (28) 0.25-35 MG-MCG tablet  Generic drug:  norgestimate-ethinyl estradiol  Take 1 tablet by mouth daily.         DundeeHope M Neese, NP 04/01/14 2156  Nelia Shiobert L Beaton, MD 04/02/14 [phone]

## 2014-04-01 NOTE — ED Notes (Signed)
Pt reports having a positive pregnancy test on 9/22 - reports abdominal cramping - states this is her first pregnancy - reports seen here on the 22nd.

## 2014-04-03 LAB — URINE CULTURE
Colony Count: NO GROWTH
Culture: NO GROWTH

## 2014-04-26 LAB — US OB COMP LESS 14 WKS

## 2014-05-03 DIAGNOSIS — Z789 Other specified health status: Secondary | ICD-10-CM | POA: Insufficient documentation

## 2014-05-03 LAB — OB RESULTS CONSOLE ABO/RH: RH Type: POSITIVE

## 2014-05-03 LAB — OB RESULTS CONSOLE GC/CHLAMYDIA
CHLAMYDIA, DNA PROBE: NEGATIVE
Gonorrhea: NEGATIVE

## 2014-05-03 LAB — OB RESULTS CONSOLE RPR: RPR: NONREACTIVE

## 2014-05-03 LAB — OB RESULTS CONSOLE HIV ANTIBODY (ROUTINE TESTING): HIV: NONREACTIVE

## 2014-05-03 LAB — OB RESULTS CONSOLE RUBELLA ANTIBODY, IGM: Rubella: NON-IMMUNE/NOT IMMUNE

## 2014-05-03 LAB — OB RESULTS CONSOLE ANTIBODY SCREEN: ANTIBODY SCREEN: NEGATIVE

## 2014-05-03 LAB — OB RESULTS CONSOLE HEPATITIS B SURFACE ANTIGEN: Hepatitis B Surface Ag: NEGATIVE

## 2014-05-11 ENCOUNTER — Encounter: Payer: Self-pay | Admitting: *Deleted

## 2014-05-12 ENCOUNTER — Ambulatory Visit (INDEPENDENT_AMBULATORY_CARE_PROVIDER_SITE_OTHER): Payer: Medicaid Other | Admitting: Cardiology

## 2014-05-12 ENCOUNTER — Encounter: Payer: Self-pay | Admitting: Cardiology

## 2014-05-12 VITALS — BP 110/68 | HR 99 | Ht 63.0 in | Wt 120.0 lb

## 2014-05-12 DIAGNOSIS — Z331 Pregnant state, incidental: Secondary | ICD-10-CM

## 2014-05-12 DIAGNOSIS — R002 Palpitations: Secondary | ICD-10-CM

## 2014-05-12 DIAGNOSIS — I471 Supraventricular tachycardia: Secondary | ICD-10-CM

## 2014-05-12 DIAGNOSIS — Z3490 Encounter for supervision of normal pregnancy, unspecified, unspecified trimester: Secondary | ICD-10-CM | POA: Insufficient documentation

## 2014-05-12 NOTE — Progress Notes (Signed)
[address] [place], [place]  [address] Phone: [phone] Fax:  [phone]  Date:  05/12/2014   ID:  Diane Cavalierlizabeth Hoeg, DOB 07/18/1985, MRN [id number]  PCP:  No PCP Per Patient   History of Present Illness: Diane Stein is a 28 y.o. female here for evaluation of palpitations, tachycardia. Pulse has been between 120 and 130 intermittently. She is in her second trimester of pregnancy. Has history of recurrent miscarriage. Albuterol, for noted on her medication. She does have prior anxiety. Rarely uses albuterol. She smoked prior to pregnancy half pack per day. Moderate alcohol prior. He has been experiencing some nausea.  Used to donate plasma. Pulse was elevated 110 every time. Has a heightened sense of anxiety which she has been battling most of her life. No Sudafed. No albuterol since last winter. Denies any shortness of breath, chest pain, syncope, orthopnea other than typical changes during pregnancy.   Wt Readings from Last 3 Encounters:  05/12/14 120 lb (54.432 kg)  04/01/14 120 lb (54.432 kg)  03/16/14 120 lb (54.432 kg)     Past Medical History  Diagnosis Date  . Asthma   . Bronchitis     Past Surgical History  Procedure Laterality Date  . Tooth extraction Right 2008    Current Outpatient Prescriptions  Medication Sig Dispense Refill  . albuterol (PROVENTIL HFA;VENTOLIN HFA) 108 (90 BASE) MCG/ACT inhaler Inhale 2 puffs into the lungs every 6 (six) hours as needed for wheezing or shortness of breath.    . promethazine (PHENERGAN) 25 MG tablet Take 0.5 tablets (12.5 mg total) by mouth every 6 (six) hours as needed for nausea or vomiting. 15 tablet 0   No current facility-administered medications for this visit.    Allergies:    Allergies  Allergen Reactions  . Oxycodone Anaphylaxis  . Codeine     Social History:  The patient  reports that she has been smoking Cigarettes.  She has been smoking about 0.50 packs per day. She has  never used smokeless tobacco. She reports that she drinks alcohol. She reports that she does not use illicit drugs.   Family History  Problem Relation Age of Onset  . Hypertension Mother   . Heart disease Father   . Heart disease Maternal Grandmother   . Hypertension Maternal Grandmother   . Heart disease Maternal Grandfather   . Hypertension Maternal Grandfather   . Heart disease Paternal Grandmother   . Hypertension Paternal Grandmother   . Heart disease Paternal Grandfather   . Hypertension Paternal Grandfather    Brother has CHF (drug related)  ROS:  Please see the history of present illness.  No syncope recently. +SOB takes albuterol.   All other systems reviewed and negative.   PHYSICAL EXAM: VS:  BP 110/68 mmHg  Pulse 99  Ht 5\' 3"  (1.6 m)  Wt 120 lb (54.432 kg)  BMI 21.26 kg/m2  LMP 02/02/2014 Well nourished, well developed, in no acute distress HEENT: normal, /AT, EOMI Neck: no JVD, normal carotid upstroke, no bruit Cardiac:  normal S1, S2; RRR; no murmur Lungs:  clear to auscultation bilaterally, no wheezing, rhonchi or rales Abd: soft, nontender, no hepatomegaly, no bruits Ext: no edema, 2+ distal pulses Skin: warm and dry GU: deferred Neuro: no focal abnormalities noted, AAO x 3  EKG:  03/16/14-  sinus tachycardia heart rate 106, normal appearing intervals Labs: TSH has been normal. Electrodes normal. Chest x-ray normal, personally viewed  ASSESSMENT AND PLAN:  1. Tachycardia-sinus tachycardia. She states that she as a heightened level of anxiety/stress. Certainly this could be contributing to her overall tachycardia. This was first noted when she was donating plasma. I do not appreciate any murmurs on exam. I will check an echocardiogram to ensure that there is no evidence of cardiomyopathy. Tachycardia-induced cardiomyopathy is usually secondary to incessant tachycardia in the 130 range. I am not seeing this. I will check a 24-hour Holter monitor to see how  her heart rate is affected during sleep and other times during the day. Avoid Sudafed which she is. She has not taken albuterol since last winter. This can increase her heart rate. TSH normal. Hopeful reassurance. 2. We will review results of testing. In the future, one could consider low-dose beta blocker after pregnancy to help her with her heart rate but this is not 100% necessary.  Signed, Donato SchultzMark Skains, MD [facility]  05/12/2014 9:59 AM

## 2014-05-12 NOTE — Patient Instructions (Signed)
The current medical regimen is effective;  continue present plan and medications.  Your physician has requested that you have an echocardiogram. Echocardiography is a painless test that uses sound waves to create images of your heart. It provides your doctor with information about the size and shape of your heart and how well your heart's chambers and valves are working. This procedure takes approximately one hour. There are no restrictions for this procedure.  Your physician has recommended that you wear a holter monitor. Holter monitors are medical devices that record the heart's electrical activity. Doctors most often use these monitors to diagnose arrhythmias. Arrhythmias are problems with the speed or rhythm of the heartbeat. The monitor is a small, portable device. You can wear one while you do your normal daily activities. This is usually used to diagnose what is causing palpitations/syncope (passing out).  Follow up will be based on these results.

## 2014-05-16 ENCOUNTER — Ambulatory Visit (HOSPITAL_COMMUNITY): Payer: Medicaid Other | Attending: Cardiology

## 2014-05-16 ENCOUNTER — Ambulatory Visit (INDEPENDENT_AMBULATORY_CARE_PROVIDER_SITE_OTHER): Payer: Medicaid Other | Admitting: *Deleted

## 2014-05-16 ENCOUNTER — Encounter: Payer: Self-pay | Admitting: *Deleted

## 2014-05-16 DIAGNOSIS — I471 Supraventricular tachycardia, unspecified: Secondary | ICD-10-CM

## 2014-05-16 DIAGNOSIS — R002 Palpitations: Secondary | ICD-10-CM

## 2014-05-16 NOTE — Progress Notes (Signed)
2D Echo completed. 05/16/2014 

## 2014-05-16 NOTE — Progress Notes (Signed)
Patient ID: Diane CavalierElizabeth Ahles, female   DOB: 10/18/1985, 28 y.o.   MRN: [id number] [employer] 24 hour holter monitor applied to patient.

## 2014-05-24 ENCOUNTER — Telehealth: Payer: Self-pay | Admitting: Cardiology

## 2014-05-24 NOTE — Telephone Encounter (Signed)
New message     Want monitor results 

## 2014-05-24 NOTE — Telephone Encounter (Signed)
Reviewed results of monitor and echo with pt.  She states understanding of results.

## 2014-05-26 DIAGNOSIS — R000 Tachycardia, unspecified: Secondary | ICD-10-CM

## 2014-05-26 HISTORY — DX: Tachycardia, unspecified: R00.0

## 2014-05-26 NOTE — L&D Delivery Note (Signed)
Operative Delivery Note At 9:07 PM a viable female was delivered via Vaginal, [employer], operational).  Presentation: vertex; Position: Left,, Occiput,, Anterior; Station: +3.  Verbal consent: obtained from patient.  Risks and benefits discussed in detail.  Risks include, but are not limited to the risks of anesthesia, bleeding, infection, damage to maternal tissues, fetal cephalhematoma.  There is also the risk of inability to effect vaginal delivery of the head, or shoulder dystocia that cannot be resolved by established maneuvers, leading to the need for emergency cesarean section.  APGAR: 9, 9; weight pending.   Placenta status: Intact, Spontaneous.   Cord:  with the following complications: None.  Anesthesia: Epidural  Instruments: Kiwi Episiotomy: None Lacerations: Vaginal Suture Repair: 3.0 vicryl rapide Est. Blood Loss (mL): 200  Mom to postpartum.  Baby to Couplet care / Skin to Skin.  Quinlynn Cuthbert D 11/08/2014, 9:29 PM

## 2014-05-29 ENCOUNTER — Telehealth: Payer: Self-pay | Admitting: Cardiology

## 2014-05-29 NOTE — Telephone Encounter (Signed)
New message     Want monitor results 

## 2014-05-29 NOTE — Telephone Encounter (Signed)
Dr Anne Fu will be in the office tomorrow to review heart monitor.

## 2014-05-30 NOTE — Telephone Encounter (Signed)
I spoke with the pt and made her aware of heart monitor results.  Per Dr Anne FuSkains heart rate was 70 during sleep, no adverse rhythms.

## 2014-11-06 ENCOUNTER — Encounter (HOSPITAL_COMMUNITY): Payer: Self-pay

## 2014-11-06 ENCOUNTER — Inpatient Hospital Stay (HOSPITAL_COMMUNITY)
Admission: AD | Admit: 2014-11-06 | Discharge: 2014-11-06 | Disposition: A | Discharge status: Home / Self Care | Payer: Medicaid Other | Source: Ambulatory Visit | Attending: Obstetrics and Gynecology | Admitting: Obstetrics and Gynecology

## 2014-11-06 DIAGNOSIS — O9989 Other specified diseases and conditions complicating pregnancy, childbirth and the puerperium: Secondary | ICD-10-CM | POA: Insufficient documentation

## 2014-11-06 DIAGNOSIS — O99334 Smoking (tobacco) complicating childbirth: Secondary | ICD-10-CM | POA: Diagnosis present

## 2014-11-06 DIAGNOSIS — O26813 Pregnancy related exhaustion and fatigue, third trimester: Principal | ICD-10-CM | POA: Diagnosis not present

## 2014-11-06 DIAGNOSIS — Z3A39 39 weeks gestation of pregnancy: Secondary | ICD-10-CM | POA: Insufficient documentation

## 2014-11-06 DIAGNOSIS — F17210 Nicotine dependence, cigarettes, uncomplicated: Secondary | ICD-10-CM | POA: Diagnosis present

## 2014-11-06 DIAGNOSIS — Z3A38 38 weeks gestation of pregnancy: Secondary | ICD-10-CM | POA: Diagnosis present

## 2014-11-06 DIAGNOSIS — O99824 Streptococcus B carrier state complicating childbirth: Secondary | ICD-10-CM | POA: Diagnosis present

## 2014-11-06 LAB — POCT FERN TEST: POCT Fern Test: NEGATIVE

## 2014-11-06 NOTE — Discharge Instructions (Signed)
Fetal Movement Counts °Patient Name: __________________________________________________ Patient Due Date: ____________________ °Performing a fetal movement count is highly recommended in high-risk pregnancies, but it is good for every pregnant woman to do. Your health care provider may ask you to start counting fetal movements at 28 weeks of the pregnancy. Fetal movements often increase: °· After eating a full meal. °· After physical activity. °· After eating or drinking something sweet or cold. °· At rest. °Pay attention to when you feel the baby is most active. This will help you notice a pattern of your baby's sleep and wake cycles and what factors contribute to an increase in fetal movement. It is important to perform a fetal movement count at the same time each day when your baby is normally most active.  °HOW TO COUNT FETAL MOVEMENTS °1. Find a quiet and comfortable area to sit or lie down on your left side. Lying on your left side provides the best blood and oxygen circulation to your baby. °2. Write down the day and time on a sheet of paper or in a journal. °3. Start counting kicks, flutters, swishes, rolls, or jabs in a 2-hour period. You should feel at least 10 movements within 2 hours. °4. If you do not feel 10 movements in 2 hours, wait 2-3 hours and count again. Look for a change in the pattern or not enough counts in 2 hours. °SEEK MEDICAL CARE IF: °· You feel less than 10 counts in 2 hours, tried twice. °· There is no movement in over an hour. °· The pattern is changing or taking longer each day to reach 10 counts in 2 hours. °· You feel the baby is not moving as he or she usually does. °Date: ____________ Movements: ____________ Start time: ____________ Finish time: ____________  °Date: ____________ Movements: ____________ Start time: ____________ Finish time: ____________ °Date: ____________ Movements: ____________ Start time: ____________ Finish time: ____________ °Date: ____________ Movements:  ____________ Start time: ____________ Finish time: ____________ °Date: ____________ Movements: ____________ Start time: ____________ Finish time: ____________ °Date: ____________ Movements: ____________ Start time: ____________ Finish time: ____________ °Date: ____________ Movements: ____________ Start time: ____________ Finish time: ____________ °Date: ____________ Movements: ____________ Start time: ____________ Finish time: ____________  °Date: ____________ Movements: ____________ Start time: ____________ Finish time: ____________ °Date: ____________ Movements: ____________ Start time: ____________ Finish time: ____________ °Date: ____________ Movements: ____________ Start time: ____________ Finish time: ____________ °Date: ____________ Movements: ____________ Start time: ____________ Finish time: ____________ °Date: ____________ Movements: ____________ Start time: ____________ Finish time: ____________ °Date: ____________ Movements: ____________ Start time: ____________ Finish time: ____________ °Date: ____________ Movements: ____________ Start time: ____________ Finish time: ____________  °Date: ____________ Movements: ____________ Start time: ____________ Finish time: ____________ °Date: ____________ Movements: ____________ Start time: ____________ Finish time: ____________ °Date: ____________ Movements: ____________ Start time: ____________ Finish time: ____________ °Date: ____________ Movements: ____________ Start time: ____________ Finish time: ____________ °Date: ____________ Movements: ____________ Start time: ____________ Finish time: ____________ °Date: ____________ Movements: ____________ Start time: ____________ Finish time: ____________ °Date: ____________ Movements: ____________ Start time: ____________ Finish time: ____________  °Date: ____________ Movements: ____________ Start time: ____________ Finish time: ____________ °Date: ____________ Movements: ____________ Start time: ____________ Finish  time: ____________ °Date: ____________ Movements: ____________ Start time: ____________ Finish time: ____________ °Date: ____________ Movements: ____________ Start time: ____________ Finish time: ____________ °Date: ____________ Movements: ____________ Start time: ____________ Finish time: ____________ °Date: ____________ Movements: ____________ Start time: ____________ Finish time: ____________ °Date: ____________ Movements: ____________ Start time: ____________ Finish time: ____________  °Date: ____________ Movements: ____________ Start time: ____________ Finish   time: ____________ °Date: ____________ Movements: ____________ Start time: ____________ Finish time: ____________ °Date: ____________ Movements: ____________ Start time: ____________ Finish time: ____________ °Date: ____________ Movements: ____________ Start time: ____________ Finish time: ____________ °Date: ____________ Movements: ____________ Start time: ____________ Finish time: ____________ °Date: ____________ Movements: ____________ Start time: ____________ Finish time: ____________ °Date: ____________ Movements: ____________ Start time: ____________ Finish time: ____________  °Date: ____________ Movements: ____________ Start time: ____________ Finish time: ____________ °Date: ____________ Movements: ____________ Start time: ____________ Finish time: ____________ °Date: ____________ Movements: ____________ Start time: ____________ Finish time: ____________ °Date: ____________ Movements: ____________ Start time: ____________ Finish time: ____________ °Date: ____________ Movements: ____________ Start time: ____________ Finish time: ____________ °Date: ____________ Movements: ____________ Start time: ____________ Finish time: ____________ °Date: ____________ Movements: ____________ Start time: ____________ Finish time: ____________  °Date: ____________ Movements: ____________ Start time: ____________ Finish time: ____________ °Date: ____________  Movements: ____________ Start time: ____________ Finish time: ____________ °Date: ____________ Movements: ____________ Start time: ____________ Finish time: ____________ °Date: ____________ Movements: ____________ Start time: ____________ Finish time: ____________ °Date: ____________ Movements: ____________ Start time: ____________ Finish time: ____________ °Date: ____________ Movements: ____________ Start time: ____________ Finish time: ____________ °Date: ____________ Movements: ____________ Start time: ____________ Finish time: ____________  °Date: ____________ Movements: ____________ Start time: ____________ Finish time: ____________ °Date: ____________ Movements: ____________ Start time: ____________ Finish time: ____________ °Date: ____________ Movements: ____________ Start time: ____________ Finish time: ____________ °Date: ____________ Movements: ____________ Start time: ____________ Finish time: ____________ °Date: ____________ Movements: ____________ Start time: ____________ Finish time: ____________ °Date: ____________ Movements: ____________ Start time: ____________ Finish time: ____________ °Document Released: 06/11/2006 Document Revised: 09/26/2013 Document Reviewed: 03/08/2012 °[employer]® Patient Information ©2015 [employer]. This information is not intended to replace advice given to you by your health care provider. Make sure you discuss any questions you have with your health care provider. °Third Trimester of Pregnancy °The third trimester is from week 29 through week 42, months 7 through 9. The third trimester is a time when the fetus is growing rapidly. At the end of the ninth month, the fetus is about 20 inches in length and weighs 6-10 pounds.  °BODY CHANGES °Your body goes through many changes during pregnancy. The changes vary from woman to woman.  °· Your weight will continue to increase. You can expect to gain 25-35 pounds (11-16 kg) by the end of the pregnancy. °· You may begin to get  stretch marks on your hips, abdomen, and breasts. °· You may urinate more often because the fetus is moving lower into your pelvis and pressing on your bladder. °· You may develop or continue to have heartburn as a result of your pregnancy. °· You may develop constipation because certain hormones are causing the muscles that push waste through your intestines to slow down. °· You may develop hemorrhoids or swollen, bulging veins (varicose veins). °· You may have pelvic pain because of the weight gain and pregnancy hormones relaxing your joints between the bones in your pelvis. Backaches may result from overexertion of the muscles supporting your posture. °· You may have changes in your hair. These can include thickening of your hair, rapid growth, and changes in texture. Some women also have hair loss during or after pregnancy, or hair that feels dry or thin. Your hair will most likely return to normal after your baby is born. °· Your breasts will continue to grow and be tender. A yellow discharge may leak from your breasts called colostrum. °· Your belly button may stick out. °· You may feel short of   breath because of your expanding uterus. °· You may notice the fetus "dropping," or moving lower in your abdomen. °· You may have a bloody mucus discharge. This usually occurs a few days to a week before labor begins. °· Your cervix becomes thin and soft (effaced) near your due date. °WHAT TO EXPECT AT YOUR PRENATAL EXAMS  °You will have prenatal exams every 2 weeks until week 36. Then, you will have weekly prenatal exams. During a routine prenatal visit: °5. You will be weighed to make sure you and the fetus are growing normally. °6. Your blood pressure is taken. °7. Your abdomen will be measured to track your baby's growth. °8. The fetal heartbeat will be listened to. °9. Any test results from the previous visit will be discussed. °10. You may have a cervical check near your due date to see if you have effaced. °At  around 36 weeks, your caregiver will check your cervix. At the same time, your caregiver will also perform a test on the secretions of the vaginal tissue. This test is to determine if a type of bacteria, Group B streptococcus, is present. Your caregiver will explain this further. °Your caregiver may ask you: °· What your birth plan is. °· How you are feeling. °· If you are feeling the baby move. °· If you have had any abnormal symptoms, such as leaking fluid, bleeding, severe headaches, or abdominal cramping. °· If you have any questions. °Other tests or screenings that may be performed during your third trimester include: °· Blood tests that check for low iron levels (anemia). °· Fetal testing to check the health, activity level, and growth of the fetus. Testing is done if you have certain medical conditions or if there are problems during the pregnancy. °FALSE LABOR °You may feel small, irregular contractions that eventually go away. These are called Braxton Hicks contractions, or false labor. Contractions may last for hours, days, or even weeks before true labor sets in. If contractions come at regular intervals, intensify, or become painful, it is best to be seen by your caregiver.  °SIGNS OF LABOR  °· Menstrual-like cramps. °· Contractions that are 5 minutes apart or less. °· Contractions that start on the top of the uterus and spread down to the lower abdomen and back. °· A sense of increased pelvic pressure or back pain. °· A watery or bloody mucus discharge that comes from the vagina. °If you have any of these signs before the 37th week of pregnancy, call your caregiver right away. You need to go to the hospital to get checked immediately. °HOME CARE INSTRUCTIONS  °· Avoid all smoking, herbs, alcohol, and unprescribed drugs. These chemicals affect the formation and growth of the baby. °· Follow your caregiver's instructions regarding medicine use. There are medicines that are either safe or unsafe to take  during pregnancy. °· Exercise only as directed by your caregiver. Experiencing uterine cramps is a good sign to stop exercising. °· Continue to eat regular, healthy meals. °· Wear a good support bra for breast tenderness. °· Do not use hot tubs, steam rooms, or saunas. °· Wear your seat belt at all times when driving. °· Avoid raw meat, uncooked cheese, cat litter boxes, and soil used by cats. These carry germs that can cause birth defects in the baby. °· Take your prenatal vitamins. °· Try taking a stool softener (if your caregiver approves) if you develop constipation. Eat more high-fiber foods, such as fresh vegetables or fruit and whole grains. Drink   plenty of fluids to keep your urine clear or pale yellow.  Take warm sitz baths to soothe any pain or discomfort caused by hemorrhoids. Use hemorrhoid cream if your caregiver approves.  If you develop varicose veins, wear support hose. Elevate your feet for 15 minutes, 3-4 times a day. Limit salt in your diet.  Avoid heavy lifting, wear low heal shoes, and practice good posture.  Rest a lot with your legs elevated if you have leg cramps or low back pain.  Visit your dentist if you have not gone during your pregnancy. Use a soft toothbrush to brush your teeth and be gentle when you floss.  A sexual relationship may be continued unless your caregiver directs you otherwise.  Do not travel far distances unless it is absolutely necessary and only with the approval of your caregiver.  Take prenatal classes to understand, practice, and ask questions about the labor and delivery.  Make a trial run to the hospital.  Pack your hospital bag.  Prepare the baby's nursery.  Continue to go to all your prenatal visits as directed by your caregiver. SEEK MEDICAL CARE IF:  You are unsure if you are in labor or if your water has broken.  You have dizziness.  You have mild pelvic cramps, pelvic pressure, or nagging pain in your abdominal area.  You have  persistent nausea, vomiting, or diarrhea.  You have a bad smelling vaginal discharge.  You have pain with urination. SEEK IMMEDIATE MEDICAL CARE IF:   You have a fever.  You are leaking fluid from your vagina.  You have spotting or bleeding from your vagina.  You have severe abdominal cramping or pain.  You have rapid weight loss or gain.  You have shortness of breath with chest pain.  You notice sudden or extreme swelling of your face, hands, ankles, feet, or legs.  You have not felt your baby move in over an hour.  You have severe headaches that do not go away with medicine.  You have vision changes. Document Released: 05/06/2001 Document Revised: 05/17/2013 Document Reviewed: 07/13/2012 [facility] Patient Information 2015 [place], [place]. This information is not intended to replace advice given to you by your health care provider. Make sure you discuss any questions you have with your health care provider. Braxton Hicks Contractions Contractions of the uterus can occur throughout pregnancy. Contractions are not always a sign that you are in labor.  WHAT ARE BRAXTON HICKS CONTRACTIONS?  Contractions that occur before labor are called Braxton Hicks contractions, or false labor. Toward the end of pregnancy (32-34 weeks), these contractions can develop more often and may become more forceful. This is not true labor because these contractions do not result in opening (dilatation) and thinning of the cervix. They are sometimes difficult to tell apart from true labor because these contractions can be forceful and people have different pain tolerances. You should not feel embarrassed if you go to the hospital with false labor. Sometimes, the only way to tell if you are in true labor is for your health care provider to look for changes in the cervix. If there are no prenatal problems or other health problems associated with the pregnancy, it is completely safe to be sent home with false labor  and await the onset of true labor. HOW CAN YOU TELL THE DIFFERENCE BETWEEN TRUE AND FALSE LABOR? False Labor  The contractions of false labor are usually shorter and not as hard as those of true labor.   The contractions  are usually irregular.   °· The contractions are often felt in the front of the lower abdomen and in the groin.   °· The contractions may go away when you walk around or change positions while lying down.   °· The contractions get weaker and are shorter lasting as time goes on.   °· The contractions do not usually become progressively stronger, regular, and closer together as with true labor.   °True Labor °11. Contractions in true labor last 30-70 seconds, become very regular, usually become more intense, and increase in frequency.   °12. The contractions do not go away with walking.   °13. The discomfort is usually felt in the top of the uterus and spreads to the lower abdomen and low back.   °14. True labor can be determined by your health care provider with an exam. This will show that the cervix is dilating and getting thinner.   °WHAT TO REMEMBER °· Keep up with your usual exercises and follow other instructions given by your health care provider.   °· Take medicines as directed by your health care provider.   °· Keep your regular prenatal appointments.   °· Eat and drink lightly if you think you are going into labor.   °· If Braxton Hicks contractions are making you uncomfortable:   °· Change your position from lying down or resting to walking, or from walking to resting.   °· Sit and rest in a tub of warm water.   °· Drink 2-3 glasses of water. Dehydration may cause these contractions.   °· Do slow and deep breathing several times an hour.   °WHEN SHOULD I SEEK IMMEDIATE MEDICAL CARE? °Seek immediate medical care if: °· Your contractions become stronger, more regular, and closer together.   °· You have fluid leaking or gushing from your vagina.   °· You have a fever.   °· You pass  blood-tinged mucus.   °· You have vaginal bleeding.   °· You have continuous abdominal pain.   °· You have low back pain that you never had before.   °· You feel your baby's head pushing down and causing pelvic pressure.   °· Your baby is not moving as much as it used to.   °Document Released: 05/12/2005 Document Revised: 05/17/2013 Document Reviewed: 02/21/2013 °Exit[employer]® Patient Information ©2015 [employer]. This information is not intended to replace advice given to you by your health care provider. Make sure you discuss any questions you have with your health care provider. ° °

## 2014-11-06 NOTE — MAU Note (Signed)
Urine in lab 

## 2014-11-06 NOTE — MAU Note (Signed)
Pt presents complaining of contractions every 5-6 minutes that started at 8am. Denies vaginal bleeding. Had some leaking of clear fluid and had to change underwear 2 times toady. Reports good fetal movement.

## 2014-11-07 ENCOUNTER — Inpatient Hospital Stay (HOSPITAL_COMMUNITY)
Admission: AD | Admit: 2014-11-07 | Discharge: 2014-11-10 | DRG: 775 | Disposition: A | Discharge status: Home / Self Care | Payer: Medicaid Other | Source: Ambulatory Visit | Attending: Obstetrics and Gynecology | Admitting: Obstetrics and Gynecology

## 2014-11-07 ENCOUNTER — Encounter (HOSPITAL_COMMUNITY): Payer: Self-pay | Admitting: *Deleted

## 2014-11-07 DIAGNOSIS — O99824 Streptococcus B carrier state complicating childbirth: Secondary | ICD-10-CM | POA: Diagnosis present

## 2014-11-07 DIAGNOSIS — Z3A38 38 weeks gestation of pregnancy: Secondary | ICD-10-CM | POA: Diagnosis present

## 2014-11-07 DIAGNOSIS — F17210 Nicotine dependence, cigarettes, uncomplicated: Secondary | ICD-10-CM | POA: Diagnosis present

## 2014-11-07 DIAGNOSIS — O26813 Pregnancy related exhaustion and fatigue, third trimester: Secondary | ICD-10-CM | POA: Diagnosis not present

## 2014-11-07 DIAGNOSIS — O99334 Smoking (tobacco) complicating childbirth: Secondary | ICD-10-CM | POA: Diagnosis present

## 2014-11-07 DIAGNOSIS — O479 False labor, unspecified: Secondary | ICD-10-CM | POA: Diagnosis present

## 2014-11-07 HISTORY — DX: Tachycardia, unspecified: R00.0

## 2014-11-07 LAB — CBC
HEMATOCRIT: 36.5 % (ref 36.0–46.0)
Hemoglobin: 12.7 g/dL (ref 12.0–15.0)
MCH: 32.3 pg (ref 26.0–34.0)
MCHC: 34.8 g/dL (ref 30.0–36.0)
MCV: 92.9 fL (ref 78.0–100.0)
Platelets: 287 10*3/uL (ref 150–400)
RBC: 3.93 MIL/uL (ref 3.87–5.11)
RDW: 14.6 % (ref 11.5–15.5)
WBC: 30.6 10*3/uL — ABNORMAL HIGH (ref 4.0–10.5)

## 2014-11-07 LAB — OB RESULTS CONSOLE GBS: GBS: POSITIVE

## 2014-11-07 LAB — TYPE AND SCREEN
ABO/RH(D): O POS
Antibody Screen: NEGATIVE

## 2014-11-07 MED ORDER — PENICILLIN G POTASSIUM 5000000 UNITS IJ SOLR
5.0000 10*6.[IU] | Freq: Once | INTRAVENOUS | Status: AC
  Administered 2014-11-08: 5 10*6.[IU] via INTRAVENOUS
  Filled 2014-11-07: qty 5

## 2014-11-07 MED ORDER — SODIUM CHLORIDE 0.9 % IJ SOLN: 3.0000 mL | INTRAMUSCULAR | Status: DC | PRN

## 2014-11-07 MED ORDER — LIDOCAINE HCL (PF) 1 % IJ SOLN
30.0000 mL | INTRAMUSCULAR | Status: DC | PRN
  Filled 2014-11-07: qty 30

## 2014-11-07 MED ORDER — CITRIC ACID-SODIUM CITRATE 334-500 MG/5ML PO SOLN: 30.0000 mL | ORAL | Status: DC | PRN

## 2014-11-07 MED ORDER — ACETAMINOPHEN 325 MG PO TABS: 650.0000 mg | ORAL_TABLET | ORAL | Status: DC | PRN

## 2014-11-07 MED ORDER — PENICILLIN G POTASSIUM 5000000 UNITS IJ SOLR
2.5000 10*6.[IU] | INTRAVENOUS | Status: DC
  Administered 2014-11-08 (×3): 2.5 10*6.[IU] via INTRAVENOUS
  Filled 2014-11-07 (×8): qty 2.5

## 2014-11-07 MED ORDER — ONDANSETRON HCL 4 MG/2ML IJ SOLN: 4.0000 mg | Freq: Four times a day (QID) | INTRAMUSCULAR | Status: DC | PRN

## 2014-11-07 MED ORDER — OXYTOCIN 40 UNITS IN LACTATED RINGERS INFUSION - SIMPLE MED
62.5000 mL/h | INTRAVENOUS | Status: DC
  Filled 2014-11-07: qty 1000

## 2014-11-07 MED ORDER — FLEET ENEMA 7-19 GM/118ML RE ENEM: 1.0000 | ENEMA | Freq: Every day | RECTAL | Status: DC | PRN

## 2014-11-07 MED ORDER — FENTANYL CITRATE (PF) 100 MCG/2ML IJ SOLN
50.0000 ug | INTRAMUSCULAR | Status: DC | PRN
  Administered 2014-11-07 – 2014-11-08 (×6): 50 ug via INTRAVENOUS
  Filled 2014-11-07 (×5): qty 2

## 2014-11-07 MED ORDER — OXYTOCIN BOLUS FROM INFUSION: 500.0000 mL | INTRAVENOUS | Status: DC

## 2014-11-07 MED ORDER — FENTANYL CITRATE (PF) 100 MCG/2ML IJ SOLN
INTRAMUSCULAR | Status: AC
  Administered 2014-11-07: 50 ug via INTRAVENOUS
  Filled 2014-11-07: qty 2

## 2014-11-07 MED ORDER — LACTATED RINGERS IV SOLN
INTRAVENOUS | Status: DC
  Administered 2014-11-07 – 2014-11-08 (×4): via INTRAVENOUS

## 2014-11-07 MED ORDER — LACTATED RINGERS IV SOLN: 500.0000 mL | INTRAVENOUS | Status: DC | PRN

## 2014-11-07 NOTE — H&P (Signed)
NAMEPURVA, VESSELL NO.:  [id number]  MEDICAL RECORD NO.:  [id number]  LOCATION:  9166                          FACILITY:  [facility]  PHYSICIAN:  Malachi Pro. Ambrose Mantle, M.D. DATE OF BIRTH:  08/09/1985  DATE OF ADMISSION:  11/07/2014 DATE OF DISCHARGE:                             HISTORY & PHYSICAL   PRESENT ILLNESS:  This is a 29 year old white female para 0-0-3-0, gravida 4, EDC November 15, 2014, at 38 weeks and 6 days' gestation, who was admitted because of frequent painful contractions and no progressive cervical dilatation.  Blood group and type O positive, negative antibody, RPR negative, urine culture negative, hepatitis B surface antigen negative, HIV negative, GC and Chlamydia negative, rubella not immune.  Hemoglobin electrophoresis AA.  Cystic fibrosis negative. Screening for chromosome abnormalities was negative, 1-hour Glucola test was 112, repeat HIV and RPR negative.  Group B strep was positive.  The patient began her prenatal course at 55 weeks' gestation.  The patient was seen by Cardiology for tachycardia and advice was to start a beta- blocker after pregnancy.  The patient had no significant prenatal problems.  Her exam on October 30, 2014, was 1 cm, 50%; however, on her exam on November 07, 2014, she was complaining of frequent contractions every 5 minutes and in severe pain.  She was 3 cm, 90%.  She came to the hospital and was evaluated in Labor and Delivery.  Two consecutive exams by the RN showed the cervix to be 3 to 4 cm.  I examined the patient, she is 2 to 3 cm.  Cervix is posterior.  Vertex is at -2.  Cervix is about 70% effaced.  The patient is having frequent contractions every 1 to 2 minutes and is not making any cervical progress.  The family and the patient did not want to be discharged.  She is 38 weeks and 6 days and even if I wanted to give her Pitocin because of the frequency of contractions, I would be unable to do that, so I have decided to  admit the patient for pain control and maybe she will either go into labor or quick contracting and she can be discharged.  PAST MEDICAL HISTORY:  History of tachycardia.  She has had an anxiety disorder.  She has asthma, but rarely uses albuterol.  PAST SURGICAL HISTORY:  At age 65, had oral surgery to have a molar removed.  ALLERGIES:  She is allergic to CODEINE, it caused anaphylaxis.  FAMILY HISTORY:  Mother with high blood pressure.  Father, heart disease.  Maternal grandmother with heart disease and high blood pressure.  Maternal grandfather, heart disease and high blood pressure. Paternal grandmother, heart disease and high blood pressure.  Paternal grandfather, heart disease and high blood pressure.  SOCIAL HISTORY:  The patient is an every day smoker.  At the onset of pregnancy, she smoked 1/2 pack a day.  She does not drink.  She claims to have used marijuana for morning sickness early in pregnancy.  She is unemployed.  Partner's name is Minerva Areola and lives in [place].  PHYSICAL EXAMINATION:  VITAL SIGNS:  On admission, temperature is 98.6, pulse is 118, respirations 22, blood pressure 131/67.  HEART:  Normal size and sounds.  No murmurs.  LUNGS:  Clear to auscultation.  ABDOMEN:  Soft.  Uterus is soft, not tender, but the contractions are every 1 to 2 minutes.  At her prenatal exam today, the fundal height was 38 cm.  By my exam, the cervix is 2 to 3 cm, posterior, 70% effaced, vertex at -2.  ADMITTING IMPRESSION:  Intrauterine pregnancy at 38 weeks 6 days, frequent painful contractions, no labor.  The patient was admitted for pain control and hopefully, her contractions will either the disappear or she will go into labor.  She is group B strep positive, but I will not start penicillin until after she declares labor.     Malachi Pro. Ambrose Mantle, M.D.     TFH/MEDQ  D:  11/07/2014  T:  11/07/2014  Job:  [id number]

## 2014-11-07 NOTE — MAU Note (Signed)
Sent from office for further eval was 3/90/-1.

## 2014-11-08 ENCOUNTER — Inpatient Hospital Stay (HOSPITAL_COMMUNITY): Payer: Medicaid Other | Admitting: Anesthesiology

## 2014-11-08 ENCOUNTER — Encounter (HOSPITAL_COMMUNITY): Payer: Self-pay | Admitting: Anesthesiology

## 2014-11-08 LAB — ABO/RH: ABO/RH(D): O POS

## 2014-11-08 LAB — RPR: RPR: NONREACTIVE

## 2014-11-08 MED ORDER — ZOLPIDEM TARTRATE 5 MG PO TABS
5.0000 mg | ORAL_TABLET | Freq: Every evening | ORAL | Status: DC | PRN
Start: 2014-11-08 — End: 2014-11-08
  Administered 2014-11-08: 5 mg via ORAL
  Filled 2014-11-08: qty 1

## 2014-11-08 MED ORDER — ALBUTEROL SULFATE (2.5 MG/3ML) 0.083% IN NEBU: 3.0000 mL | INHALATION_SOLUTION | Freq: Four times a day (QID) | RESPIRATORY_TRACT | Status: DC | PRN

## 2014-11-08 MED ORDER — ONDANSETRON HCL 4 MG/2ML IJ SOLN: 4.0000 mg | INTRAMUSCULAR | Status: DC | PRN

## 2014-11-08 MED ORDER — ONDANSETRON HCL 4 MG PO TABS: 4.0000 mg | ORAL_TABLET | ORAL | Status: DC | PRN

## 2014-11-08 MED ORDER — ACETAMINOPHEN 325 MG PO TABS: 650.0000 mg | ORAL_TABLET | ORAL | Status: DC | PRN

## 2014-11-08 MED ORDER — LANOLIN HYDROUS EX OINT: TOPICAL_OINTMENT | CUTANEOUS | Status: DC | PRN

## 2014-11-08 MED ORDER — PRENATAL MULTIVITAMIN CH
1.0000 | ORAL_TABLET | Freq: Every day | ORAL | Status: DC
  Administered 2014-11-09 – 2014-11-10 (×2): 1 via ORAL
  Filled 2014-11-08 (×2): qty 1

## 2014-11-08 MED ORDER — METHYLERGONOVINE MALEATE 0.2 MG PO TABS: 0.2000 mg | ORAL_TABLET | ORAL | Status: DC | PRN

## 2014-11-08 MED ORDER — METHYLERGONOVINE MALEATE 0.2 MG/ML IJ SOLN
0.2000 mg | INTRAMUSCULAR | Status: DC | PRN
Start: 2014-11-08 — End: 2014-11-10

## 2014-11-08 MED ORDER — MEASLES, MUMPS & RUBELLA VAC ~~LOC~~ INJ
0.5000 mL | INJECTION | Freq: Once | SUBCUTANEOUS | Status: AC
  Administered 2014-11-10: 0.5 mL via SUBCUTANEOUS
  Filled 2014-11-08 (×2): qty 0.5

## 2014-11-08 MED ORDER — WITCH HAZEL-GLYCERIN EX PADS: 1.0000 "application " | MEDICATED_PAD | CUTANEOUS | Status: DC | PRN

## 2014-11-08 MED ORDER — LIDOCAINE HCL (PF) 1 % IJ SOLN
INTRAMUSCULAR | Status: DC | PRN
  Administered 2014-11-08: 6 mL
  Administered 2014-11-08: 9 mL

## 2014-11-08 MED ORDER — TETANUS-DIPHTH-ACELL PERTUSSIS 5-2.5-18.5 LF-MCG/0.5 IM SUSP: 0.5000 mL | Freq: Once | INTRAMUSCULAR | Status: DC

## 2014-11-08 MED ORDER — ZOLPIDEM TARTRATE 5 MG PO TABS: 5.0000 mg | ORAL_TABLET | Freq: Every evening | ORAL | Status: DC | PRN

## 2014-11-08 MED ORDER — OXYTOCIN 40 UNITS IN LACTATED RINGERS INFUSION - SIMPLE MED: 1.0000 m[IU]/min | INTRAVENOUS | Status: DC

## 2014-11-08 MED ORDER — FENTANYL 2.5 MCG/ML BUPIVACAINE 1/10 % EPIDURAL INFUSION (WH - ANES)
14.0000 mL/h | INTRAMUSCULAR | Status: DC | PRN
  Administered 2014-11-08 (×4): 14 mL/h via EPIDURAL
  Filled 2014-11-08 (×3): qty 125

## 2014-11-08 MED ORDER — DIBUCAINE 1 % RE OINT: 1.0000 "application " | TOPICAL_OINTMENT | RECTAL | Status: DC | PRN

## 2014-11-08 MED ORDER — DIPHENHYDRAMINE HCL 50 MG/ML IJ SOLN: 12.5000 mg | INTRAMUSCULAR | Status: DC | PRN

## 2014-11-08 MED ORDER — MAGNESIUM HYDROXIDE 400 MG/5ML PO SUSP: 30.0000 mL | ORAL | Status: DC | PRN

## 2014-11-08 MED ORDER — TERBUTALINE SULFATE 1 MG/ML IJ SOLN
0.2500 mg | Freq: Once | INTRAMUSCULAR | Status: DC | PRN
  Filled 2014-11-08: qty 1

## 2014-11-08 MED ORDER — DIPHENHYDRAMINE HCL 25 MG PO CAPS: 25.0000 mg | ORAL_CAPSULE | Freq: Four times a day (QID) | ORAL | Status: DC | PRN

## 2014-11-08 MED ORDER — PHENYLEPHRINE 40 MCG/ML (10ML) SYRINGE FOR IV PUSH (FOR BLOOD PRESSURE SUPPORT)
80.0000 ug | PREFILLED_SYRINGE | INTRAVENOUS | Status: DC | PRN
  Filled 2014-11-08: qty 2
  Filled 2014-11-08: qty 20

## 2014-11-08 MED ORDER — BENZOCAINE-MENTHOL 20-0.5 % EX AERO
1.0000 "application " | INHALATION_SPRAY | CUTANEOUS | Status: DC | PRN
  Administered 2014-11-09: 1 via TOPICAL
  Filled 2014-11-08: qty 56

## 2014-11-08 MED ORDER — SENNOSIDES-DOCUSATE SODIUM 8.6-50 MG PO TABS
2.0000 | ORAL_TABLET | ORAL | Status: DC
  Administered 2014-11-09 – 2014-11-10 (×2): 2 via ORAL
  Filled 2014-11-08 (×2): qty 2

## 2014-11-08 MED ORDER — SIMETHICONE 80 MG PO CHEW: 80.0000 mg | CHEWABLE_TABLET | ORAL | Status: DC | PRN

## 2014-11-08 MED ORDER — IBUPROFEN 600 MG PO TABS
600.0000 mg | ORAL_TABLET | Freq: Four times a day (QID) | ORAL | Status: DC
  Administered 2014-11-08 – 2014-11-10 (×7): 600 mg via ORAL
  Filled 2014-11-08 (×7): qty 1

## 2014-11-08 MED ORDER — TRAMADOL HCL 50 MG PO TABS
50.0000 mg | ORAL_TABLET | Freq: Four times a day (QID) | ORAL | Status: DC | PRN
  Administered 2014-11-09 – 2014-11-10 (×5): 50 mg via ORAL
  Filled 2014-11-08 (×5): qty 1

## 2014-11-08 MED ORDER — EPHEDRINE 5 MG/ML INJ
10.0000 mg | INTRAVENOUS | Status: DC | PRN
  Filled 2014-11-08: qty 2

## 2014-11-08 NOTE — Anesthesia Preprocedure Evaluation (Signed)
Anesthesia Evaluation  Patient identified by MRN, date of birth, ID band Patient awake    Reviewed: Allergy & Precautions, H&P , NPO status , Patient's Chart, lab work & pertinent test results  Airway Mallampati: I  TM Distance: >3 FB Neck ROM: full    Dental no notable dental hx.    Pulmonary Current Smoker,    Pulmonary exam normal        Cardiovascular negative cardio ROS Normal cardiovascular exam     Neuro/Psych negative neurological ROS  negative psych ROS   GI/Hepatic negative GI ROS, Neg liver ROS,   Endo/Other  negative endocrine ROS  Renal/GU negative Renal ROS     Musculoskeletal   Abdominal Normal abdominal exam  (+)   Peds  Hematology negative hematology ROS (+)   Anesthesia Other Findings   Reproductive/Obstetrics (+) Pregnancy                             Anesthesia Physical Anesthesia Plan  ASA: II  Anesthesia Plan: Epidural   Post-op Pain Management:    Induction:   Airway Management Planned:   Additional Equipment:   Intra-op Plan:   Post-operative Plan:   Informed Consent: I have reviewed the patients History and Physical, chart, labs and discussed the procedure including the risks, benefits and alternatives for the proposed anesthesia with the patient or authorized representative who has indicated his/her understanding and acceptance.     Plan Discussed with:   Anesthesia Plan Comments:         Anesthesia Quick Evaluation  

## 2014-11-08 NOTE — Progress Notes (Signed)
Comfortable with epidural Afeb, VSS FHT- Cat I, ctx q 2 min VE-8-9/90/-1, vtx Continue pitocin, continue PCN, monitor progress

## 2014-11-08 NOTE — Progress Notes (Signed)
Patient ID: Diane Stein, female   DOB: 01-13-1986, 29 y.o.   MRN: [id number] Pt continues to contract at and 1/2 to 2 and 1/2 minute intervals. The FHR is category 1 and the cervix is 4 cm 89-90 % effaced and the vertex is at - 2 station. Vertex confirmed by [place] The vertex is higher than I want it for AROM. pT WILL RECEIVE AN EPIDURAL

## 2014-11-08 NOTE — Progress Notes (Signed)
Patient ID: Diane Stein, female   DOB: 1985/08/16, 29 y.o.   MRN: [id number] Pushing for 2 hours, getting tired Afeb, VSS FHT- Cat II, some variables, + scalp stim VE-C/C/+3, LOA Discussed options due to her fatigue.  She will push for 15 more minutes and then consider assistance.

## 2014-11-08 NOTE — Anesthesia Procedure Notes (Signed)
Epidural Patient location during procedure: OB Start time: 11/08/2014 5:07 AM End time: 11/08/2014 5:11 AM  Staffing Anesthesiologist: Leilani Able  Preanesthetic Checklist Completed: patient identified, surgical consent, pre-op evaluation, timeout performed, IV checked, risks and benefits discussed and monitors and equipment checked  Epidural Patient position: sitting Prep: site prepped and draped and DuraPrep Patient monitoring: continuous pulse ox and blood pressure Approach: midline Location: L3-L4 Injection technique: LOR air  Needle:  Needle type: Tuohy  Needle gauge: 17 G Needle length: 9 cm and 9 Needle insertion depth: 5 cm cm Catheter type: closed end flexible Catheter size: 19 Gauge Catheter at skin depth: 10 cm Test dose: negative and Other  Assessment Sensory level: T9 Events: blood not aspirated, injection not painful, no injection resistance, negative IV test and no paresthesia  Additional Notes Reason for block:procedure for pain

## 2014-11-08 NOTE — Progress Notes (Signed)
Patient ID: Diane Stein, female   DOB: [birth year], 29 y.o.   MRN: [id number] Comfortable with epidural Afeb, VSS FHT- Cat I VE-5-6/90/-1 to 0, vtx, AROM clear Will augment with pitocin for protracted labor, continue PCN for +GBS, monitor progress

## 2014-11-08 NOTE — Progress Notes (Signed)
Patient ID: Diane Stein, female   DOB: Jun 08, 1985, 29 y.o.   MRN: [id number] With the epidural the contractions have spaced out but still at 2 minute intervals FHT's normal. I have transferred her care to Dr. Jackelyn Knife.

## 2014-11-09 MED ORDER — ESCITALOPRAM OXALATE 10 MG PO TABS
10.0000 mg | ORAL_TABLET | Freq: Every day | ORAL | Status: DC
  Administered 2014-11-09 – 2014-11-10 (×2): 10 mg via ORAL
  Filled 2014-11-09 (×2): qty 1

## 2014-11-09 MED ORDER — PNEUMOCOCCAL VAC POLYVALENT 25 MCG/0.5ML IJ INJ
0.5000 mL | INJECTION | INTRAMUSCULAR | Status: AC
  Administered 2014-11-10: 0.5 mL via INTRAMUSCULAR
  Filled 2014-11-09 (×2): qty 0.5

## 2014-11-09 NOTE — Lactation Note (Signed)
This note was copied from the chart of Diane Stasia Cavalier. Lactation Consultation Note New mom BF in cradle position. Assisted in widening flange to breast. Mom stated feels better. Baby tends to get wide then narrows flange. Explained newborn behavior. Referred to Baby and Me Book in Breastfeeding section Pg. 22-23 for position options and Proper latch demonstration. Mom encouraged to feed baby 8-12 times/24 hours and with feeding cues. Mom encouraged to waken baby for feeds. Mom encouraged to do skin-to-skin, mom had baby swaddled.  Mom has bouncy areolas and good everted nipples. Mom reports + breast changes w/pregnancy. [facility] brochure given w/resources, support groups and LC services. Patient Name: Diane Stein [id number] Date: 11/09/2014 Reason for consult: Initial assessment   Maternal Data Has patient been taught Hand Expression?: Yes Does the patient have breastfeeding experience prior to this delivery?: No  Feeding Feeding Type: Breast Fed Length of feed: 20 min (still BF)  LATCH Score/Interventions Latch: Grasps breast easily, tongue down, lips flanged, rhythmical sucking.  Audible Swallowing: A few with stimulation Intervention(s): Hand expression  Type of Nipple: Everted at rest and after stimulation  Comfort (Breast/Nipple): Filling, red/small blisters or bruises, mild/mod discomfort  Problem noted: Mild/Moderate discomfort Interventions (Mild/moderate discomfort): Hand massage;Hand expression  Hold (Positioning): Assistance needed to correctly position infant at breast and maintain latch. Intervention(s): Breastfeeding basics reviewed;Support Pillows;Position options;Skin to skin  LATCH Score: 7  Lactation Tools Discussed/Used     Consult Status Consult Status: Follow-up Date: 11/10/14 Follow-up type: In-patient    Tremaine Fuhriman, Diamond Nickel 11/09/2014, 5:59 AM

## 2014-11-09 NOTE — Progress Notes (Signed)
Post Partum Day 1 Subjective: no complaints, up ad lib, tolerating PO and nl lochia, pain controlled  Objective: Blood pressure 121/54, pulse 104, temperature 98.1 F (36.7 C), temperature source Oral, resp. rate 20, height 5\' 3"  (1.6 m), weight 67.586 kg (149 lb), last menstrual period 02/02/2014, SpO2 97 %, unknown if currently breastfeeding.  Physical Exam:  General: alert and no distress Lochia: appropriate Uterine Fundus: firm   Recent Labs  11/07/14 2110  HGB 12.7  HCT 36.5    Assessment/Plan: Plan for discharge tomorrow, Breastfeeding and Lactation consult.  Routine care   LOS: 2 days   Diane Stein, Diane Stein 11/09/2014, 6:05 AM

## 2014-11-09 NOTE — Anesthesia Postprocedure Evaluation (Signed)
  Anesthesia Post-op Note  Patient: Diane Stein  Procedure(s) Performed: * No procedures listed *  Patient Location: Mother/Baby  Anesthesia Type:Epidural  Level of Consciousness: awake, alert , oriented and patient cooperative  Airway and Oxygen Therapy: Patient Spontanous Breathing  Post-op Pain: none  Post-op Assessment: Post-op Vital signs reviewed, Patient's Cardiovascular Status Stable, Respiratory Function Stable, Patent Airway, No headache, No backache and Patient able to bend at knees              Post-op Vital Signs: Reviewed and stable  Last Vitals:  Filed Vitals:   11/09/14 0520  BP:   Pulse: 105  Temp: 36.6 C  Resp: 20    Complications: No apparent anesthesia complications

## 2014-11-09 NOTE — Progress Notes (Signed)
CM / UR chart review completed.  

## 2014-11-09 NOTE — Clinical Social Work Maternal (Addendum)
CLINICAL SOCIAL WORK MATERNAL/CHILD NOTE  Patient Details  Name: Diane Stein MRN: [id number] Date of Birth: 09/22/1985  Date:  11/09/2014  Clinical Social Worker Initiating Note:  Neel Buffone E. Mays Paino, LCSW Date/ Time Initiated:  11/09/14/1200     Child's Name:  Diane Stein   Legal Guardian:   (Parents: Kennesha Diffley and Eric Stein)   Need for Interpreter:  None   Date of Referral:  11/09/14     Reason for Referral:  Other (Comment) (Hx of Anxiety)   Referral Source:  Central Nursery   Address:  [address], [place], [place] [address]  Phone number:  [phone]   Household Members:  Parents, Siblings   Natural Supports (not living in the home):  Extended Family, Friends, Immediate Family   Professional Supports:     Employment:     Type of Work:     Education:      Financial Resources:  Medicaid   Other Resources:      Cultural/Religious Considerations Which May Impact Care: None stated  Strengths:  Ability to meet basic needs , Home prepared for child    Risk Factors/Current Problems:  Mental Health Concerns , Substance Use  (Hx of Anxiety.  PNR notes THC use.)   Cognitive State:  Alert , Linear Thinking , Insightful    Mood/Affect:  Happy , Relaxed , Calm , Comfortable , Interested    CSW Assessment: CSW met with MOB and MGM, whom she states we can speak openly in front of, in MOB's first floor room/122 to complete assessment for hx of Anxiety.  MOB and MGM were pleasant and welcoming of CSW's visit.  MOB reports labor and delivery went well and that she and baby are doing well at this time.  She states breast feeding is going well, which she is very thankful for because she was worried about it.  MOB reports having everything she needs for baby at home and a great support system.  She states she lives with her parents and brother.   CSW provided information regarding signs and symptoms of PPD and asked that MOB contact CSW and or her doctor if  she has concerns about her emotions at any time.  CSW asked MOB about her symptoms of Anxiety in the past and how she felt emotionally during her pregnancy.  She states she had high anxiety during pregnancy, mainly associated with stress related to her brother's illness (MGM states he had a heart attack and is in kidney failure).  MGM also states that she had a heart attack in December 2015.  MOB reports symptoms of Anxiety prior to pregnancy as well.  MGM states she takes Lexapro and "cannot sing it's praises enough."  MOB states she has never taken an antidepressant, but was taking Ativan prior to pregnancy.  She states she wants to "see how it goes" and hope to not need anything until she is done breast feeding.  CSW discussed the benefits of an antidepressant in treating anxiety as well and informed MOB that there are safe antidepressants to take while breast feeding.  MOB appeared interested in discussing this further.  CSW explained that if MOB waits until symptoms worsen again before taking medication, she will then have to wait 4-6 weeks until the medication can take effect.  Although antianxiety medications can be used during breast feeding, they need to be used very sparingly and carefully.  While an advocate of breast feeding, CSW explained that although there are safe medications to   take to treat anxiety while breast feeding and CSW ultimately recommends starting one for MOB, CSW stated that her mental health is of utmost importance and that if it came to the point that she needed added medication to stabilize her emotions, CSW feels this is more important than baby receiving breast milk.  MOB agreed with CSW and thanked CSW for pointing this out.  CSW discussed the benefits of counseling also and that hopefully MOB will be able to manage any symptom that may arise with safe medication and counseling if needed.  She does not feel she needs to see a counselor at this time, but was thankful for the  information.  MOB would like CSW to speak with her doctor about starting her on an antidepressant and will talk with her when she rounds today also.  CSW called Dr. Bovard/OB to discuss. CSW inquired about MOB's hx of marijuana use noted in her chart.  She states she stopped smoking when she found out that she was pregnant and does not feel marijuana use is an issue for her.  She denies self-medicating with marijuana.  CSW informed her of hospital drug screen policy and of baby's negative UDS and pending MDS.  MOB was understanding and stated no concerns.  CSW Plan/Description:  Patient/Family Education , Psychosocial Support and Ongoing Assessment of Needs, Information/Referral to Community Resources     Denali Becvar Shandricka, LCSW 11/09/2014, 2:57 PM 

## 2014-11-10 MED ORDER — PRENATAL MULTIVITAMIN CH: 1.0000 | ORAL_TABLET | Freq: Every day | ORAL | Status: DC

## 2014-11-10 MED ORDER — TRAMADOL HCL 50 MG PO TABS: 50.0000 mg | ORAL_TABLET | Freq: Four times a day (QID) | ORAL | Status: DC | PRN

## 2014-11-10 MED ORDER — IBUPROFEN 800 MG PO TABS: 800.0000 mg | ORAL_TABLET | Freq: Three times a day (TID) | ORAL | Status: DC | PRN

## 2014-11-10 MED ORDER — ESCITALOPRAM OXALATE 10 MG PO TABS: 10.0000 mg | ORAL_TABLET | Freq: Every day | ORAL | Status: DC

## 2014-11-10 NOTE — Discharge Summary (Signed)
Obstetric Discharge Summary Reason for Admission: induction of labor Prenatal Procedures: none Intrapartum Procedures: vacuum Postpartum Procedures: none Complications-Operative and Postpartum: vaginal laceration HEMOGLOBIN  Date Value Ref Range Status  11/07/2014 12.7 12.0 - 15.0 g/dL Final   HCT  Date Value Ref Range Status  11/07/2014 36.5 36.0 - 46.0 % Final    Physical Exam:  General: alert and no distress Lochia: appropriate Uterine Fundus: firm  Discharge Diagnoses: Term Pregnancy-delivered  Discharge Information: Date: 11/10/2014 Activity: pelvic rest Diet: routine Medications: PNV, Ibuprofen and tramadol, Lexapro Condition: stable Instructions: refer to practice specific booklet Discharge to: home Follow-up Information    Follow up with MEISINGER,TODD D, MD. Schedule an appointment as soon as possible for a visit in 6 weeks.   Specialty:  Obstetrics and Gynecology   Why:  for postpartum check; 2weeks for med check for Lexapro - Meisinger or Bovard   Contact information:   [address] [place] [address] [phone]       Newborn Data: Live born female  Birth Weight: 7 lb 7.6 oz (3390 g) APGAR: 8, 9  Home with mother.  Bovard-Stuckert, Kelia Gibbon 11/10/2014, 8:10 AM

## 2014-11-10 NOTE — Progress Notes (Addendum)
Post Partum Day 2 Subjective: no complaints, up ad lib, voiding, tolerating PO and nl lochia, pain controllled.  Started on Lexapro - given anxiety and worries about PPD.    Objective: Blood pressure 106/51, pulse 93, temperature 97.6 F (36.4 C), temperature source Oral, resp. rate 18, height 5\' 3"  (1.6 m), weight 67.586 kg (149 lb), last menstrual period 02/02/2014, SpO2 97 %, unknown if currently breastfeeding.  Physical Exam:  General: alert and no distress Lochia: appropriate Uterine Fundus: firm   Recent Labs  11/07/14 2110  HGB 12.7  HCT 36.5    Assessment/Plan: Discharge home, Breastfeeding and Lactation consult.  D/c with motrin, tramadol and PNV.  F/u 2weeks if desired, 6 wk full PP   LOS: 3 days   Bovard-Stuckert, Diane Stein 11/10/2014, 7:34 AM

## 2014-12-11 DIAGNOSIS — F419 Anxiety disorder, unspecified: Secondary | ICD-10-CM | POA: Diagnosis present

## 2015-06-11 ENCOUNTER — Encounter (HOSPITAL_BASED_OUTPATIENT_CLINIC_OR_DEPARTMENT_OTHER): Payer: Self-pay | Admitting: *Deleted

## 2015-06-11 ENCOUNTER — Emergency Department (HOSPITAL_BASED_OUTPATIENT_CLINIC_OR_DEPARTMENT_OTHER)
Admission: EM | Admit: 2015-06-11 | Discharge: 2015-06-11 | Discharge status: Against Medical Advice | Payer: Medicaid Other | Attending: Emergency Medicine | Admitting: Emergency Medicine

## 2015-06-11 DIAGNOSIS — J45909 Unspecified asthma, uncomplicated: Secondary | ICD-10-CM | POA: Diagnosis not present

## 2015-06-11 DIAGNOSIS — R109 Unspecified abdominal pain: Secondary | ICD-10-CM | POA: Diagnosis present

## 2015-06-11 DIAGNOSIS — F17210 Nicotine dependence, cigarettes, uncomplicated: Secondary | ICD-10-CM | POA: Diagnosis not present

## 2015-06-11 LAB — URINE MICROSCOPIC-ADD ON

## 2015-06-11 LAB — URINALYSIS, ROUTINE W REFLEX MICROSCOPIC
Bilirubin Urine: NEGATIVE
GLUCOSE, UA: NEGATIVE mg/dL
KETONES UR: NEGATIVE mg/dL
Nitrite: NEGATIVE
PH: 6 (ref 5.0–8.0)
Protein, ur: NEGATIVE mg/dL
SPECIFIC GRAVITY, URINE: 1.022 (ref 1.005–1.030)

## 2015-06-11 NOTE — ED Notes (Signed)
C/o left side pain. No vaginal discharge or problems with urination. Pains started 2 days ago. No other sx.

## 2015-06-12 ENCOUNTER — Emergency Department (HOSPITAL_BASED_OUTPATIENT_CLINIC_OR_DEPARTMENT_OTHER)
Admission: EM | Admit: 2015-06-12 | Discharge: 2015-06-12 | Disposition: A | Discharge status: Home / Self Care | Payer: Medicaid Other | Attending: Emergency Medicine | Admitting: Emergency Medicine

## 2015-06-12 ENCOUNTER — Encounter (HOSPITAL_BASED_OUTPATIENT_CLINIC_OR_DEPARTMENT_OTHER): Payer: Self-pay | Admitting: *Deleted

## 2015-06-12 DIAGNOSIS — Y9289 Other specified places as the place of occurrence of the external cause: Secondary | ICD-10-CM | POA: Insufficient documentation

## 2015-06-12 DIAGNOSIS — S3992XA Unspecified injury of lower back, initial encounter: Secondary | ICD-10-CM | POA: Diagnosis not present

## 2015-06-12 DIAGNOSIS — F17210 Nicotine dependence, cigarettes, uncomplicated: Secondary | ICD-10-CM | POA: Insufficient documentation

## 2015-06-12 DIAGNOSIS — J45909 Unspecified asthma, uncomplicated: Secondary | ICD-10-CM | POA: Diagnosis not present

## 2015-06-12 DIAGNOSIS — S39011A Strain of muscle, fascia and tendon of abdomen, initial encounter: Secondary | ICD-10-CM | POA: Insufficient documentation

## 2015-06-12 DIAGNOSIS — Z79899 Other long term (current) drug therapy: Secondary | ICD-10-CM | POA: Diagnosis not present

## 2015-06-12 DIAGNOSIS — Y9389 Activity, other specified: Secondary | ICD-10-CM | POA: Insufficient documentation

## 2015-06-12 DIAGNOSIS — Z3202 Encounter for pregnancy test, result negative: Secondary | ICD-10-CM | POA: Diagnosis not present

## 2015-06-12 DIAGNOSIS — X58XXXA Exposure to other specified factors, initial encounter: Secondary | ICD-10-CM | POA: Diagnosis not present

## 2015-06-12 DIAGNOSIS — Y998 Other external cause status: Secondary | ICD-10-CM | POA: Insufficient documentation

## 2015-06-12 DIAGNOSIS — S3991XA Unspecified injury of abdomen, initial encounter: Secondary | ICD-10-CM | POA: Diagnosis present

## 2015-06-12 LAB — CBC WITH DIFFERENTIAL/PLATELET
BASOS ABS: 0 10*3/uL (ref 0.0–0.1)
Basophils Relative: 0 %
EOS PCT: 2 %
Eosinophils Absolute: 0.1 10*3/uL (ref 0.0–0.7)
HCT: 41.6 % (ref 36.0–46.0)
Hemoglobin: 13.6 g/dL (ref 12.0–15.0)
LYMPHS PCT: 22 %
Lymphs Abs: 1.7 10*3/uL (ref 0.7–4.0)
MCH: 30.4 pg (ref 26.0–34.0)
MCHC: 32.7 g/dL (ref 30.0–36.0)
MCV: 93.1 fL (ref 78.0–100.0)
MONO ABS: 0.5 10*3/uL (ref 0.1–1.0)
Monocytes Relative: 7 %
Neutro Abs: 5.1 10*3/uL (ref 1.7–7.7)
Neutrophils Relative %: 69 %
PLATELETS: 255 10*3/uL (ref 150–400)
RBC: 4.47 MIL/uL (ref 3.87–5.11)
RDW: 14.4 % (ref 11.5–15.5)
WBC: 7.4 10*3/uL (ref 4.0–10.5)

## 2015-06-12 LAB — COMPREHENSIVE METABOLIC PANEL WITH GFR
ALT: 12 U/L — ABNORMAL LOW (ref 14–54)
AST: 14 U/L — ABNORMAL LOW (ref 15–41)
Albumin: 3.7 g/dL (ref 3.5–5.0)
Alkaline Phosphatase: 74 U/L (ref 38–126)
Anion gap: 8 (ref 5–15)
BUN: 10 mg/dL (ref 6–20)
CO2: 25 mmol/L (ref 22–32)
Calcium: 8.7 mg/dL — ABNORMAL LOW (ref 8.9–10.3)
Chloride: 107 mmol/L (ref 101–111)
Creatinine, Ser: 0.65 mg/dL (ref 0.44–1.00)
GFR calc Af Amer: >60 mL/min
GFR calc non Af Amer: >60 mL/min
Glucose, Bld: 108 mg/dL — ABNORMAL HIGH (ref 65–99)
Potassium: 3.9 mmol/L (ref 3.5–5.1)
Sodium: 140 mmol/L (ref 135–145)
Total Bilirubin: 0.3 mg/dL (ref 0.3–1.2)
Total Protein: 6.8 g/dL (ref 6.5–8.1)

## 2015-06-12 LAB — URINALYSIS, ROUTINE W REFLEX MICROSCOPIC
Bilirubin Urine: NEGATIVE
Glucose, UA: NEGATIVE mg/dL
Hgb urine dipstick: NEGATIVE
KETONES UR: NEGATIVE mg/dL
LEUKOCYTES UA: NEGATIVE
NITRITE: NEGATIVE
Protein, ur: NEGATIVE mg/dL
Specific Gravity, Urine: 1.019 (ref 1.005–1.030)
pH: 8 (ref 5.0–8.0)

## 2015-06-12 LAB — PREGNANCY, URINE: PREG TEST UR: NEGATIVE

## 2015-06-12 LAB — LIPASE, BLOOD: LIPASE: 29 U/L (ref 11–51)

## 2015-06-12 MED ORDER — IBUPROFEN 400 MG PO TABS
600.0000 mg | ORAL_TABLET | Freq: Once | ORAL | Status: AC
  Administered 2015-06-12: 600 mg via ORAL
  Filled 2015-06-12: qty 1

## 2015-06-12 MED ORDER — METHOCARBAMOL 500 MG PO TABS
1000.0000 mg | ORAL_TABLET | Freq: Three times a day (TID) | ORAL | Status: DC | PRN
Start: 2015-06-12 — End: 2015-12-27

## 2015-06-12 MED ORDER — METHOCARBAMOL 500 MG PO TABS
1000.0000 mg | ORAL_TABLET | Freq: Once | ORAL | Status: AC
  Administered 2015-06-12: 1000 mg via ORAL
  Filled 2015-06-12: qty 2

## 2015-06-12 MED ORDER — IBUPROFEN 600 MG PO TABS: 600.0000 mg | ORAL_TABLET | Freq: Four times a day (QID) | ORAL | Status: DC | PRN

## 2015-06-12 MED FILL — METHOCARBAMOL 500 MG TABLET: 500 | 5 days supply | Qty: 30 | Fill #0

## 2015-06-12 MED FILL — IBUPROFEN 600 MG TABLET: 600 | 7 days supply | Qty: 30 | Fill #0

## 2015-06-12 NOTE — ED Notes (Addendum)
C/o left side pain. No vaginal discharge or urinary sx. No other sx. Pt was here yesterday for same and left before being seen. Pt gave urine sample yesterday.

## 2015-06-12 NOTE — ED Notes (Signed)
MD at bedside. 

## 2015-06-12 NOTE — Discharge Instructions (Signed)

## 2015-06-12 NOTE — ED Provider Notes (Signed)
CSN: [id number]     Arrival date & time 06/12/15  0836 History   First MD Initiated Contact with Patient 06/12/15 0845     No chief complaint on file.    (Consider location/radiation/quality/duration/timing/severity/associated sxs/prior Treatment) HPI Patient presents with 3 days of left-sided abdominal pain and left flank pain. States the pain was gradual onset. It is worse with movement and trying to pick up her child. She denies any fever or chills. She's had no nausea, vomiting, diarrhea or constipation. States she had a normal bowel movement this morning. No melena or frank blood. Denies any dysuria, frequency, hematuria. No vaginal discharge or bleeding.  Past Medical History  Diagnosis Date  . Asthma   . Bronchitis   . Tachycardia 2016   Past Surgical History  Procedure Laterality Date  . Tooth extraction Right 2008   Family History  Problem Relation Age of Onset  . Hypertension Mother   . Heart disease Father   . Heart disease Maternal Grandmother   . Hypertension Maternal Grandmother   . Heart disease Maternal Grandfather   . Hypertension Maternal Grandfather   . Heart disease Paternal Grandmother   . Hypertension Paternal Grandmother   . Heart disease Paternal Grandfather   . Hypertension Paternal Grandfather    Social History  Substance Use Topics  . Smoking status: Current Every Day Smoker -- 0.50 packs/day    Types: Cigarettes  . Smokeless tobacco: Never Used  . Alcohol Use: 0.0 oz/week     Comment: Used in early pregnancy for morning sickness   OB History    Gravida Para Term Preterm AB TAB SAB Ectopic Multiple Living   0 1     Review of Systems  Constitutional: Negative for fever and chills.  Respiratory: Negative for shortness of breath.   Cardiovascular: Negative for chest pain.  Gastrointestinal: Positive for abdominal pain. Negative for nausea, vomiting, diarrhea, constipation, blood in stool and abdominal distention.  Genitourinary:  Positive for flank pain. Negative for dysuria, frequency, hematuria, vaginal bleeding, vaginal discharge and pelvic pain.  Musculoskeletal: Positive for myalgias and back pain.  Skin: Negative for rash and wound.  Neurological: Negative for dizziness, weakness, light-headedness, numbness and headaches.  All other systems reviewed and are negative.     Allergies  Codeine; Oxycodone; Oxycodone-acetaminophen; and Other  Home Medications   Prior to Admission medications   Medication Sig Start Date End Date Taking? Authorizing Provider  albuterol (PROVENTIL HFA;VENTOLIN HFA) 108 (90 BASE) MCG/ACT inhaler Inhale 2 puffs into the lungs every 6 (six) hours as needed for wheezing or shortness of breath.   Yes Historical Provider, MD  citalopram (CELEXA) 40 MG tablet Take 40 mg by mouth daily.   Yes Historical Provider, MD  doxepin (SINEQUAN) 10 MG capsule Take 25 mg by mouth 2 (two) times daily.   Yes Historical Provider, MD  LORazepam (ATIVAN) 1 MG tablet Take 1 mg by mouth every 8 (eight) hours as needed for anxiety.   Yes Historical Provider, MD  ibuprofen (ADVIL,MOTRIN) 600 MG tablet Take 1 tablet (600 mg total) by mouth every 6 (six) hours as needed. 06/12/15   Loren Racer, MD  methocarbamol (ROBAXIN) 500 MG tablet Take 2 tablets (1,000 mg total) by mouth every 8 (eight) hours as needed for muscle spasms. 06/12/15   Loren Racer, MD   BP 124/76 mmHg  Pulse 90  Temp(Src) 98.1 F (36.7 C) (Oral)  Resp 16  Ht  (1.6 m)  Wt 135 lb (61.236 kg)  BMI 23.92 kg/m2  SpO2 99%  LMP 06/07/2015 Physical Exam  Constitutional: She is oriented to person, place, and time. She appears well-developed and well-nourished. No distress.  HENT:  Head: Normocephalic and atraumatic.  Mouth/Throat: Oropharynx is clear and moist.  Eyes: EOM are normal. Pupils are equal, round, and reactive to light.  Neck: Normal range of motion. Neck supple.  Cardiovascular: Normal rate and regular rhythm.    Pulmonary/Chest: Effort normal and breath sounds normal. No respiratory distress. She has no wheezes. She has no rales. She exhibits no tenderness.  Abdominal: Soft. Bowel sounds are normal. She exhibits no distension and no mass. There is tenderness (patient has tenderness to light palpation over the left upper and lower quadrants. There is no rebound or guarding. No splenomegaly.). There is no rebound and no guarding.  Musculoskeletal: Normal range of motion. She exhibits tenderness. She exhibits no edema.  Patient has tenderness to palpation over the left flank and left lumbar paraspinal muscles. No midline thoracic or lumbar tenderness. No lower extremity swelling or pain. Distal pulses are equal and intact.  Neurological: She is alert and oriented to person, place, and time.  Patient is alert and oriented x3 with clear, goal oriented speech. Patient has 5/5 motor in all extremities. Sensation is intact to light touch. Patient has a normal gait and walks without assistance.  Skin: Skin is warm and dry. No rash noted. No erythema.  Psychiatric: She has a normal mood and affect. Her behavior is normal.  Nursing note and vitals reviewed.   ED Course  Procedures (including critical care time) Labs Review Labs Reviewed  COMPREHENSIVE METABOLIC PANEL - Abnormal; Notable for the following:    Glucose, Bld 108 (*)    Calcium 8.7 (*)    AST 14 (*)    ALT 12 (*)    All other components within normal limits  URINALYSIS, ROUTINE W REFLEX MICROSCOPIC (NOT AT [facility]) - Abnormal; Notable for the following:    Color, Urine AMBER (*)    APPearance CLOUDY (*)    All other components within normal limits  CBC WITH DIFFERENTIAL/PLATELET  LIPASE, BLOOD  PREGNANCY, URINE    Imaging Review No results found. I have personally reviewed and evaluated these images and lab results as part of my medical decision-making.   EKG Interpretation None      MDM   Final diagnoses:  Abdominal wall strain,  initial encounter    Patient is well-appearing. No peritoneal signs on abdominal exam. Suspect muscle strain of the abdominal wall and the paraspinal lumbar muscles. Possibly related to picking up her child. We'll screen with abdominal labs and UA.  Normal LFTs, UA. Repeat abdominal exam without peritoneal findings. Will treat with NSAIDs and muscle relaxants. She's been given return precautions and has voiced understanding.  Loren Racer, MD 06/12/15 [phone]

## 2015-12-27 ENCOUNTER — Encounter (HOSPITAL_BASED_OUTPATIENT_CLINIC_OR_DEPARTMENT_OTHER): Payer: Self-pay

## 2015-12-27 ENCOUNTER — Emergency Department (HOSPITAL_BASED_OUTPATIENT_CLINIC_OR_DEPARTMENT_OTHER): Payer: Medicaid Other

## 2015-12-27 ENCOUNTER — Emergency Department (HOSPITAL_BASED_OUTPATIENT_CLINIC_OR_DEPARTMENT_OTHER)
Admission: EM | Admit: 2015-12-27 | Discharge: 2015-12-27 | Disposition: A | Discharge status: Home / Self Care | Payer: Medicaid Other | Attending: Physician Assistant | Admitting: Physician Assistant

## 2015-12-27 DIAGNOSIS — N83202 Unspecified ovarian cyst, left side: Secondary | ICD-10-CM | POA: Insufficient documentation

## 2015-12-27 DIAGNOSIS — Z79899 Other long term (current) drug therapy: Secondary | ICD-10-CM | POA: Diagnosis not present

## 2015-12-27 DIAGNOSIS — J45909 Unspecified asthma, uncomplicated: Secondary | ICD-10-CM | POA: Diagnosis not present

## 2015-12-27 DIAGNOSIS — R1032 Left lower quadrant pain: Secondary | ICD-10-CM | POA: Diagnosis present

## 2015-12-27 DIAGNOSIS — F17210 Nicotine dependence, cigarettes, uncomplicated: Secondary | ICD-10-CM | POA: Diagnosis not present

## 2015-12-27 LAB — PREGNANCY, URINE: PREG TEST UR: NEGATIVE

## 2015-12-27 LAB — CBC WITH DIFFERENTIAL/PLATELET
BASOS ABS: 0 10*3/uL (ref 0.0–0.1)
BASOS PCT: 0 %
Eosinophils Absolute: 0.2 10*3/uL (ref 0.0–0.7)
Eosinophils Relative: 2 %
HEMATOCRIT: 39.4 % (ref 36.0–46.0)
HEMOGLOBIN: 13.4 g/dL (ref 12.0–15.0)
LYMPHS PCT: 24 %
Lymphs Abs: 2.4 10*3/uL (ref 0.7–4.0)
MCH: 32.3 pg (ref 26.0–34.0)
MCHC: 34 g/dL (ref 30.0–36.0)
MCV: 94.9 fL (ref 78.0–100.0)
MONO ABS: 0.8 10*3/uL (ref 0.1–1.0)
Monocytes Relative: 8 %
NEUTROS ABS: 6.8 10*3/uL (ref 1.7–7.7)
NEUTROS PCT: 66 %
Platelets: 243 10*3/uL (ref 150–400)
RBC: 4.15 MIL/uL (ref 3.87–5.11)
RDW: 13.9 % (ref 11.5–15.5)
WBC: 10.2 10*3/uL (ref 4.0–10.5)

## 2015-12-27 LAB — LIPASE, BLOOD: Lipase: 39 U/L (ref 11–51)

## 2015-12-27 LAB — URINALYSIS, ROUTINE W REFLEX MICROSCOPIC
Bilirubin Urine: NEGATIVE
Glucose, UA: NEGATIVE mg/dL
Hgb urine dipstick: NEGATIVE
KETONES UR: NEGATIVE mg/dL
LEUKOCYTES UA: NEGATIVE
NITRITE: NEGATIVE
PROTEIN: NEGATIVE mg/dL
Specific Gravity, Urine: 1.022 (ref 1.005–1.030)
pH: 8 (ref 5.0–8.0)

## 2015-12-27 LAB — COMPREHENSIVE METABOLIC PANEL
ALBUMIN: 3.6 g/dL (ref 3.5–5.0)
ALT: 16 U/L (ref 14–54)
AST: 18 U/L (ref 15–41)
Alkaline Phosphatase: 65 U/L (ref 38–126)
Anion gap: 7 (ref 5–15)
BILIRUBIN TOTAL: 0.2 mg/dL — AB (ref 0.3–1.2)
BUN: 11 mg/dL (ref 6–20)
CO2: 26 mmol/L (ref 22–32)
CREATININE: 0.66 mg/dL (ref 0.44–1.00)
Calcium: 8.7 mg/dL — ABNORMAL LOW (ref 8.9–10.3)
Chloride: 102 mmol/L (ref 101–111)
GFR calc Af Amer: >60 mL/min (ref >60)
GFR calc non Af Amer: >60 mL/min (ref >60)
GLUCOSE: 92 mg/dL (ref 65–99)
POTASSIUM: 3.6 mmol/L (ref 3.5–5.1)
Sodium: 135 mmol/L (ref 135–145)
TOTAL PROTEIN: 6.4 g/dL — AB (ref 6.5–8.1)

## 2015-12-27 LAB — URINE MICROSCOPIC-ADD ON

## 2015-12-27 MED ORDER — ONDANSETRON HCL 4 MG PO TABS: 4.0000 mg | ORAL_TABLET | Freq: Four times a day (QID) | ORAL | 0 refills | Status: DC

## 2015-12-27 MED ORDER — HYDROCODONE-ACETAMINOPHEN 5-325 MG PO TABS
1.0000 | ORAL_TABLET | ORAL | 0 refills | Status: DC | PRN
Start: 2015-12-27 — End: 2016-03-12

## 2015-12-27 MED ORDER — ONDANSETRON HCL 4 MG/2ML IJ SOLN
4.0000 mg | Freq: Once | INTRAMUSCULAR | Status: AC
  Administered 2015-12-27: 4 mg via INTRAVENOUS
  Filled 2015-12-27: qty 2

## 2015-12-27 MED ORDER — FENTANYL CITRATE (PF) 100 MCG/2ML IJ SOLN
50.0000 ug | Freq: Once | INTRAMUSCULAR | Status: AC
  Administered 2015-12-27: 50 ug via INTRAVENOUS
  Filled 2015-12-27: qty 2

## 2015-12-27 MED ORDER — KETOROLAC TROMETHAMINE 30 MG/ML IJ SOLN
30.0000 mg | Freq: Once | INTRAMUSCULAR | Status: AC
  Administered 2015-12-27: 30 mg via INTRAVENOUS
  Filled 2015-12-27: qty 1

## 2015-12-27 MED FILL — ONDANSETRON HCL 4 MG TABLET: 4 | 3 days supply | Qty: 12 | Fill #0

## 2015-12-27 MED FILL — HYDROCODON-APAP 5-325: 5-325 | 2 days supply | Qty: 15 | Fill #0

## 2015-12-27 NOTE — ED Provider Notes (Signed)
[facility]   CSN: [id number] Arrival date & time: 12/27/15  1154  First Provider Contact:  None       History   Chief Complaint Chief Complaint  Patient presents with  . Abdominal Pain    HPI Diane Stein is a 30 y.o. female.  Patient presents today with acute onset of left flank pain radiating to her left abdomen onset this morning.  She is having pain of both the LUQ and LLQ of her abdomen.  She states that the pain has been constant, but at times worsens.  She currently rates her pain a 9 out of 10.  She states that she has had similar pain in the past and was told that it was a muscle strain.  Pain is worse with certain movements.   She denies any injury or trauma.  No heavy lifting or straining.  She does reports associated nausea.  She denies vomiting, hematuria, dysuria, increased urinary frequency, urgency, fever, chills,       Past Medical History:  Diagnosis Date  . Asthma   . Bronchitis   . Tachycardia 2016    Patient Active Problem List   Diagnosis Date Noted  . SVD (spontaneous vaginal delivery) 11/08/2014  . Irregular uterine contractions 11/07/2014  . Palpitations 05/12/2014  . Paroxysmal supraventricular tachycardia (HCC) 05/12/2014  . Pregnancy 05/12/2014    Past Surgical History:  Procedure Laterality Date  . TOOTH EXTRACTION Right 2008    OB History    Gravida Para Term Preterm AB Living   5 1 1   3 1    SAB TAB Ectopic Multiple Live Births   3     0 1       Home Medications    Prior to Admission medications   Medication Sig Start Date End Date Taking? Authorizing Provider  desvenlafaxine (PRISTIQ) 100 MG 24 hr tablet Take 100 mg by mouth daily.   Yes Historical Provider, MD  Norgestimate-Eth Estradiol (SPRINTEC 28 PO) Take by mouth.   Yes Historical Provider, MD  propranolol (INDERAL) 10 MG tablet Take 10 mg by mouth 2 (two) times daily.   Yes Historical Provider, MD  albuterol (PROVENTIL HFA;VENTOLIN  HFA) 108 (90 BASE) MCG/ACT inhaler Inhale 2 puffs into the lungs every 6 (six) hours as needed for wheezing or shortness of breath.    Historical Provider, MD  doxepin (SINEQUAN) 10 MG capsule Take 150 mg by mouth daily.     Historical Provider, MD  LORazepam (ATIVAN) 1 MG tablet Take 1 mg by mouth every 8 (eight) hours as needed for anxiety.    Historical Provider, MD    Family History Family History  Problem Relation Age of Onset  . Hypertension Mother   . Heart disease Father   . Heart disease Maternal Grandmother   . Hypertension Maternal Grandmother   . Heart disease Maternal Grandfather   . Hypertension Maternal Grandfather   . Heart disease Paternal Grandmother   . Hypertension Paternal Grandmother   . Heart disease Paternal Grandfather   . Hypertension Paternal Grandfather     Social History Social History  Substance Use Topics  . Smoking status: Current Every Day Smoker    Packs/day: 0.50    Types: Cigarettes  . Smokeless tobacco: Never Used  . Alcohol use No     Allergies   Codeine; Oxycodone; Oxycodone-acetaminophen; and Other   Review of Systems Review of Systems  All other systems reviewed and are negative.    Physical  Exam Updated Vital Signs BP 125/61 (BP Location: Left Arm)   Pulse 96   Temp 98.8 F (37.1 C) (Oral)   Resp 20   Ht  (1.6 m)   Wt 65.8 kg   LMP 12/18/2015   SpO2 98%   BMI 25.69 kg/m   Physical Exam  Constitutional: She appears well-developed and well-nourished.  HENT:  Head: Normocephalic and atraumatic.  Neck: Normal range of motion. Neck supple.  Cardiovascular: Normal rate and regular rhythm.   Pulmonary/Chest: Effort normal and breath sounds normal.  Abdominal: Soft. Bowel sounds are normal. She exhibits no distension and no mass. There is no splenomegaly or hepatomegaly. There is tenderness in the left upper quadrant and left lower quadrant. There is no rebound, no guarding, no tenderness at McBurney's point and  negative Murphy's sign. No hernia.  Left CVA tenderness  Nursing note and vitals reviewed.    ED Treatments / Results  Labs (all labs ordered are listed, but only abnormal results are displayed) Labs Reviewed  PREGNANCY, URINE  URINALYSIS, ROUTINE W REFLEX MICROSCOPIC (NOT AT [facility])  CBC WITH DIFFERENTIAL/PLATELET  COMPREHENSIVE METABOLIC PANEL  LIPASE, BLOOD    EKG  EKG Interpretation None       Radiology No results found.  Procedures Procedures (including critical care time)  Medications Ordered in ED Medications  fentaNYL (SUBLIMAZE) injection 50 mcg (not administered)  ondansetron (ZOFRAN) injection 4 mg (not administered)     Initial Impression / Assessment and Plan / ED Course  I have reviewed the triage vital signs and the nursing notes.  Pertinent labs & imaging results that were available during my care of the patient were reviewed by me and considered in my medical decision making (see chart for details).  Clinical Course   Patient presents today with LUQ and LLQ abdominal pain onset this morning.  No acute distress on exam.  She does have tenderness to palpation of the LUQ and LLQ on exam, but no rebound or guarding.  Labs unremarkable.  Urine pregnancy negative. UA negative.  CT renal stone study showing left ovarian cyst.  Patient declined pelvic exam.  She states that she already has an appointment with her Gynecologist scheduled next week.  Stable for discharge.  Return precautions given.     Final Clinical Impressions(s) / ED Diagnoses   Final diagnoses:  None    New Prescriptions New Prescriptions   No medications on file     Santiago Glad, PA-C 12/28/15 2153    Courteney Lyn Mackuen, MD 12/29/15 [phone]

## 2015-12-27 NOTE — ED Triage Notes (Signed)
C/o left side abd pain x today-NAD-steady gait

## 2016-03-12 ENCOUNTER — Emergency Department (HOSPITAL_BASED_OUTPATIENT_CLINIC_OR_DEPARTMENT_OTHER)
Admission: EM | Admit: 2016-03-12 | Discharge: 2016-03-12 | Disposition: A | Discharge status: Home / Self Care | Payer: Medicaid Other | Attending: Emergency Medicine | Admitting: Emergency Medicine

## 2016-03-12 ENCOUNTER — Encounter (HOSPITAL_BASED_OUTPATIENT_CLINIC_OR_DEPARTMENT_OTHER): Payer: Self-pay | Admitting: Emergency Medicine

## 2016-03-12 ENCOUNTER — Emergency Department (HOSPITAL_BASED_OUTPATIENT_CLINIC_OR_DEPARTMENT_OTHER): Payer: Medicaid Other

## 2016-03-12 DIAGNOSIS — M546 Pain in thoracic spine: Secondary | ICD-10-CM | POA: Diagnosis not present

## 2016-03-12 DIAGNOSIS — F17210 Nicotine dependence, cigarettes, uncomplicated: Secondary | ICD-10-CM | POA: Diagnosis not present

## 2016-03-12 DIAGNOSIS — M549 Dorsalgia, unspecified: Secondary | ICD-10-CM | POA: Diagnosis present

## 2016-03-12 DIAGNOSIS — J45909 Unspecified asthma, uncomplicated: Secondary | ICD-10-CM | POA: Insufficient documentation

## 2016-03-12 LAB — D-DIMER, QUANTITATIVE: D-Dimer, Quant: 0.37 ug/mL-FEU (ref 0.00–0.50)

## 2016-03-12 MED ORDER — HYDROCODONE-ACETAMINOPHEN 5-325 MG PO TABS
2.0000 | ORAL_TABLET | Freq: Once | ORAL | Status: AC
  Administered 2016-03-12: 2 via ORAL
  Filled 2016-03-12: qty 2

## 2016-03-12 MED ORDER — HYDROCODONE-ACETAMINOPHEN 5-325 MG PO TABS: 2.0000 | ORAL_TABLET | ORAL | 0 refills | Status: DC | PRN

## 2016-03-12 MED ORDER — IBUPROFEN 800 MG PO TABS: 800.0000 mg | ORAL_TABLET | Freq: Three times a day (TID) | ORAL | 0 refills | Status: DC

## 2016-03-12 MED FILL — IBUPROFEN 800 MG TABLET: 800 | 7 days supply | Qty: 21 | Fill #0

## 2016-03-12 MED FILL — HYDROCODON-APAP 5-325: 5-325 | 3 days supply | Qty: 20 | Fill #0

## 2016-03-12 NOTE — ED Provider Notes (Signed)
[facility]   CSN: [id number] Arrival date & time: 03/12/16  0931     History   Chief Complaint Chief Complaint  Patient presents with  . Back Pain    HPI Diane Stein is a 30 y.o. female.  The history is provided by the patient. No language interpreter was used.  Back Pain   This is a new problem. The problem occurs constantly. The problem has not changed since onset.The pain is associated with no known injury. The pain is present in the thoracic spine. The pain is moderate. The pain is worse during the day. She has tried nothing for the symptoms.    Past Medical History:  Diagnosis Date  . Asthma   . Bronchitis   . Tachycardia 2016    Patient Active Problem List   Diagnosis Date Noted  . SVD (spontaneous vaginal delivery) 11/08/2014  . Irregular uterine contractions 11/07/2014  . Palpitations 05/12/2014  . Paroxysmal supraventricular tachycardia (HCC) 05/12/2014  . Pregnancy 05/12/2014    Past Surgical History:  Procedure Laterality Date  . TOOTH EXTRACTION Right 2008    OB History    Gravida Para Term Preterm AB Living   5 1 1   3 1    SAB TAB Ectopic Multiple Live Births   3     0 1       Home Medications    Prior to Admission medications   Medication Sig Start Date End Date Taking? Authorizing Provider  albuterol (PROVENTIL HFA;VENTOLIN HFA) 108 (90 BASE) MCG/ACT inhaler Inhale 2 puffs into the lungs every 6 (six) hours as needed for wheezing or shortness of breath.    Historical Provider, MD  desvenlafaxine (PRISTIQ) 100 MG 24 hr tablet Take 100 mg by mouth daily.    Historical Provider, MD  doxepin (SINEQUAN) 10 MG capsule Take 150 mg by mouth daily.     Historical Provider, MD  HYDROcodone-acetaminophen (NORCO/VICODIN) 5-325 MG tablet Take 1-2 tablets by mouth every 4 (four) hours as needed. 12/27/15   Heather Laisure, PA-C  LORazepam (ATIVAN) 1 MG tablet Take 1 mg by mouth every 8 (eight) hours as needed for anxiety.     Historical Provider, MD  Norgestimate-Eth Estradiol (SPRINTEC 28 PO) Take by mouth.    Historical Provider, MD  ondansetron (ZOFRAN) 4 MG tablet Take 1 tablet (4 mg total) by mouth every 6 (six) hours. 12/27/15   Heather Laisure, PA-C  propranolol (INDERAL) 10 MG tablet Take 10 mg by mouth 2 (two) times daily.    Historical Provider, MD    Family History Family History  Problem Relation Age of Onset  . Hypertension Mother   . Heart disease Father   . Heart disease Maternal Grandmother   . Hypertension Maternal Grandmother   . Heart disease Maternal Grandfather   . Hypertension Maternal Grandfather   . Heart disease Paternal Grandmother   . Hypertension Paternal Grandmother   . Heart disease Paternal Grandfather   . Hypertension Paternal Grandfather     Social History Social History  Substance Use Topics  . Smoking status: Current Every Day Smoker    Packs/day: 0.50    Types: Cigarettes  . Smokeless tobacco: Never Used  . Alcohol use No     Allergies   Codeine; Oxycodone; Oxycodone-acetaminophen; and Other   Review of Systems Review of Systems  Musculoskeletal: Positive for back pain.  All other systems reviewed and are negative.    Physical Exam Updated Vital Signs BP 133/82 (BP Location:  Left Arm)   Pulse 72   Temp 98.1 F (36.7 C) (Oral)   Resp 18   Ht 5\' 3"  (1.6 m)   Wt 68 kg   LMP 03/12/2016 (Exact Date) Comment: neg u preg in ed today   SpO2 99%   BMI 26.57 kg/m   Physical Exam  Constitutional: She appears well-developed and well-nourished. No distress.  HENT:  Head: Normocephalic and atraumatic.  Eyes: Conjunctivae are normal.  Neck: Neck supple.  Cardiovascular: Normal rate and regular rhythm.   No murmur heard. Pulmonary/Chest: Effort normal and breath sounds normal. No respiratory distress.  Abdominal: Soft. There is no tenderness.  Musculoskeletal: She exhibits tenderness. She exhibits no edema.  Tender left thoracic spine,  Pain to range  of motion  Neurological: She is alert.  Skin: Skin is warm and dry.  Psychiatric: She has a normal mood and affect.  Nursing note and vitals reviewed.    ED Treatments / Results  Labs (all labs ordered are listed, but only abnormal results are displayed) Labs Reviewed  D-DIMER, QUANTITATIVE (NOT AT [facility])  ddimer negative  EKG  EKG Interpretation None       Radiology Dg Chest 2 View  Result Date: 03/12/2016 CLINICAL DATA:  Left side posterior back pain EXAM: CHEST  2 VIEW COMPARISON:  02/24/2014 FINDINGS: The heart size and mediastinal contours are within normal limits. Both lungs are clear. The visualized skeletal structures are unremarkable. IMPRESSION: No active cardiopulmonary disease. Electronically Signed   By: Natasha Mead M.D.   On: 03/12/2016 10:49    Procedures Procedures (including critical care time)  Medications Ordered in ED Medications  HYDROcodone-acetaminophen (NORCO/VICODIN) 5-325 MG per tablet 2 tablet (2 tablets Oral Given 03/12/16 1048)     Initial Impression / Assessment and Plan / ED Course  I have reviewed the triage vital signs and the nursing notes.  Pertinent labs & imaging results that were available during my care of the patient were reviewed by me and considered in my medical decision making (see chart for details).  Clinical Course  Value Comment By Time  D-Dimer, Quant: 0.37 (Reviewed) Elson Areas, PA-C 10/18 1131      Final Clinical Impressions(s) / ED Diagnoses   Final diagnoses:  Acute left-sided thoracic back pain    New Prescriptions Discharge Medication List as of 03/12/2016 11:46 AM    START taking these medications   Details  ibuprofen (ADVIL,MOTRIN) 800 MG tablet Take 1 tablet (800 mg total) by mouth 3 (three) times daily., Starting Wed 03/12/2016, Print         Lonia Skinner Bartow, PA-C 03/12/16 1229    Nelva Nay, MD 03/13/16 [phone]

## 2016-03-12 NOTE — ED Triage Notes (Signed)
Patient states that she is having having mid back pain. Reports that it hurts when she breaths. Does not remember any injury

## 2016-03-14 ENCOUNTER — Emergency Department (HOSPITAL_BASED_OUTPATIENT_CLINIC_OR_DEPARTMENT_OTHER)
Admission: EM | Admit: 2016-03-14 | Discharge: 2016-03-14 | Disposition: A | Discharge status: Home / Self Care | Payer: Medicaid Other | Attending: Emergency Medicine | Admitting: Emergency Medicine

## 2016-03-14 ENCOUNTER — Encounter (HOSPITAL_BASED_OUTPATIENT_CLINIC_OR_DEPARTMENT_OTHER): Payer: Self-pay | Admitting: Emergency Medicine

## 2016-03-14 DIAGNOSIS — Z79899 Other long term (current) drug therapy: Secondary | ICD-10-CM | POA: Diagnosis not present

## 2016-03-14 DIAGNOSIS — F17210 Nicotine dependence, cigarettes, uncomplicated: Secondary | ICD-10-CM | POA: Diagnosis not present

## 2016-03-14 DIAGNOSIS — M549 Dorsalgia, unspecified: Secondary | ICD-10-CM | POA: Diagnosis present

## 2016-03-14 DIAGNOSIS — M62830 Muscle spasm of back: Secondary | ICD-10-CM

## 2016-03-14 DIAGNOSIS — J45909 Unspecified asthma, uncomplicated: Secondary | ICD-10-CM | POA: Insufficient documentation

## 2016-03-14 MED ORDER — METHOCARBAMOL 500 MG PO TABS
500.0000 mg | ORAL_TABLET | Freq: Once | ORAL | Status: AC
  Administered 2016-03-14: 500 mg via ORAL
  Filled 2016-03-14: qty 1

## 2016-03-14 MED ORDER — LIDOCAINE-EPINEPHRINE 2 %-1:100000 IJ SOLN
20.0000 mL | Freq: Once | INTRAMUSCULAR | Status: AC
  Administered 2016-03-14: 20 mL
  Filled 2016-03-14: qty 1

## 2016-03-14 MED ORDER — KETOROLAC TROMETHAMINE 60 MG/2ML IM SOLN
60.0000 mg | Freq: Once | INTRAMUSCULAR | Status: AC
  Administered 2016-03-14: 60 mg via INTRAMUSCULAR
  Filled 2016-03-14: qty 2

## 2016-03-14 MED ORDER — HYDROMORPHONE HCL 1 MG/ML IJ SOLN
1.0000 mg | Freq: Once | INTRAMUSCULAR | Status: AC
  Administered 2016-03-14: 1 mg via INTRAMUSCULAR
  Filled 2016-03-14: qty 1

## 2016-03-14 MED ORDER — METHOCARBAMOL 500 MG PO TABS: 500.0000 mg | ORAL_TABLET | Freq: Two times a day (BID) | ORAL | 0 refills | Status: DC

## 2016-03-14 MED ORDER — HYDROCODONE-ACETAMINOPHEN 5-325 MG PO TABS: 1.0000 | ORAL_TABLET | Freq: Four times a day (QID) | ORAL | 0 refills | Status: DC | PRN

## 2016-03-14 MED FILL — METHOCARBAMOL 500 MG TABLET: 500 | 10 days supply | Qty: 20 | Fill #0

## 2016-03-14 MED FILL — HYDROCODON-APAP 5-325: 5-325 | 2 days supply | Qty: 15 | Fill #0

## 2016-03-14 NOTE — ED Triage Notes (Signed)
Pt reports thoracic back pain, seen here 2 days ago. Pt states pain has not gotten any better and became worse this morning when she picked up her daughter.

## 2016-03-14 NOTE — ED Provider Notes (Signed)
[facility] Provider Note   CSN: [id number] Arrival date & time: 03/14/16  4098     History   Chief Complaint Chief Complaint  Patient presents with  . Back Pain    HPI Diane Stein is a 30 y.o. female.  The history is provided by the patient.  Back Pain   This is a new problem. Episode onset: 3 days ago. The problem occurs constantly. The problem has been rapidly worsening. The pain is associated with no known injury (but constantly lifting at work and today pain became suddenly worse when she leaned over the crib to pick up her 21lb daughter). The pain is present in the thoracic spine. The quality of the pain is described as stabbing, shooting and cramping. The pain does not radiate. The pain is at a severity of 10/10. The pain is severe. The symptoms are aggravated by bending, twisting and certain positions. The pain is the same all the time. Pertinent negatives include no chest pain, no fever, no numbness, no abdominal pain, no bowel incontinence, no bladder incontinence, no dysuria, no tingling and no weakness. She has tried NSAIDs (pain meds) for the symptoms. The treatment provided no relief.    Past Medical History:  Diagnosis Date  . Asthma   . Bronchitis   . Tachycardia 2016    Patient Active Problem List   Diagnosis Date Noted  . SVD (spontaneous vaginal delivery) 11/08/2014  . Irregular uterine contractions 11/07/2014  . Palpitations 05/12/2014  . Paroxysmal supraventricular tachycardia (HCC) 05/12/2014  . Pregnancy 05/12/2014    Past Surgical History:  Procedure Laterality Date  . TOOTH EXTRACTION Right 2008    OB History    Gravida Para Term Preterm AB Living   5 1 1   3 1    SAB TAB Ectopic Multiple Live Births   3     0 1       Home Medications    Prior to Admission medications   Medication Sig Start Date End Date Taking? Authorizing Provider  albuterol (PROVENTIL HFA;VENTOLIN HFA) 108 (90 BASE) MCG/ACT inhaler Inhale 2 puffs  into the lungs every 6 (six) hours as needed for wheezing or shortness of breath.    Historical Provider, MD  desvenlafaxine (PRISTIQ) 100 MG 24 hr tablet Take 100 mg by mouth daily.    Historical Provider, MD  doxepin (SINEQUAN) 10 MG capsule Take 150 mg by mouth daily.     Historical Provider, MD  HYDROcodone-acetaminophen (NORCO/VICODIN) 5-325 MG tablet Take 2 tablets by mouth every 4 (four) hours as needed. 03/12/16   Elson Areas, PA-C  ibuprofen (ADVIL,MOTRIN) 800 MG tablet Take 1 tablet (800 mg total) by mouth 3 (three) times daily. 03/12/16   Elson Areas, PA-C  LORazepam (ATIVAN) 1 MG tablet Take 1 mg by mouth every 8 (eight) hours as needed for anxiety.    Historical Provider, MD  Norgestimate-Eth Estradiol (SPRINTEC 28 PO) Take by mouth.    Historical Provider, MD  ondansetron (ZOFRAN) 4 MG tablet Take 1 tablet (4 mg total) by mouth every 6 (six) hours. 12/27/15   Heather Laisure, PA-C  propranolol (INDERAL) 10 MG tablet Take 10 mg by mouth 2 (two) times daily.    Historical Provider, MD    Family History Family History  Problem Relation Age of Onset  . Hypertension Mother   . Heart disease Father   . Heart disease Maternal Grandmother   . Hypertension Maternal Grandmother   . Heart disease Maternal Grandfather   .  Hypertension Maternal Grandfather   . Heart disease Paternal Grandmother   . Hypertension Paternal Grandmother   . Heart disease Paternal Grandfather   . Hypertension Paternal Grandfather     Social History Social History  Substance Use Topics  . Smoking status: Current Every Day Smoker    Packs/day: 0.50    Types: Cigarettes  . Smokeless tobacco: Never Used  . Alcohol use No     Allergies   Codeine; Oxycodone; Oxycodone-acetaminophen; and Other   Review of Systems Review of Systems  Constitutional: Negative for fever.  Cardiovascular: Negative for chest pain.  Gastrointestinal: Negative for abdominal pain and bowel incontinence.  Genitourinary:  Negative for bladder incontinence and dysuria.  Musculoskeletal: Positive for back pain.  Neurological: Negative for tingling, weakness and numbness.  All other systems reviewed and are negative.    Physical Exam Updated Vital Signs BP 125/75 (BP Location: Left Arm)   Pulse 72   Temp 98.1 F (36.7 C)   Resp 16   Wt 150 lb (68 kg)   LMP 03/12/2016 (Exact Date) Comment: neg u preg in ed today   BMI 26.57 kg/m   Physical Exam  Constitutional: She is oriented to person, place, and time. She appears well-developed and well-nourished. She appears distressed.  Appears in pain  HENT:  Head: Normocephalic and atraumatic.  Eyes: Conjunctivae and EOM are normal. Pupils are equal, round, and reactive to light.  Neck: Normal range of motion. Neck supple.  Cardiovascular: Normal rate and intact distal pulses.   Pulmonary/Chest: Effort normal.  Musculoskeletal: Normal range of motion. She exhibits tenderness. She exhibits no edema.       Thoracic back: She exhibits tenderness, pain and spasm.       Back:  Neurological: She is alert and oriented to person, place, and time.  Skin: Skin is warm and dry. No rash noted. No erythema.  Psychiatric: She has a normal mood and affect. Her behavior is normal.  Nursing note and vitals reviewed.    ED Treatments / Results  Labs (all labs ordered are listed, but only abnormal results are displayed) Labs Reviewed - No data to display  EKG  EKG Interpretation None       Radiology No results found.  Procedures Procedures (including critical care time)  Medications Ordered in ED Medications  HYDROmorphone (DILAUDID) injection 1 mg (not administered)  lidocaine-EPINEPHrine (XYLOCAINE W/EPI) 2 %-1:100000 (with pres) injection 20 mL (20 mLs Infiltration Given 03/14/16 1058)  methocarbamol (ROBAXIN) tablet 500 mg (500 mg Oral Given 03/14/16 1057)  ketorolac (TORADOL) injection 60 mg (60 mg Intramuscular Given 03/14/16 1058)     Initial  Impression / Assessment and Plan / ED Course  I have reviewed the triage vital signs and the nursing notes.  Pertinent labs & imaging results that were available during my care of the patient were reviewed by me and considered in my medical decision making (see chart for details).  Clinical Course   Patient seen in the emergency department 2 days ago for acute thoracic back strain at that time she had a chest x-ray which was abnormal and a d-dimer which was negative with low suspicion for PE or pneumothorax. She had been taking hydrocodone and ibuprofen but leaned over her daughter's crib this morning to pick her up and felt a pop in her back and sudden severe pain that has not been relieved. She states it is painful to breathe or move in any direction. She has no numbness, tingling, weakness or neurologic  complaints. On exam she has evidence of muscular spasm and pain in the midthoracic spine. Minimal central midline tenderness. Do not feel that patient needs any further imaging at this time. Triggerpoints were injected with lidocaine and she was given Toradol and Robaxin. Will recheck for pain improvement.  11:56 AM Some improvement but still having pain.  Given im injection of dialudid  Final Clinical Impressions(s) / ED Diagnoses   Final diagnoses:  Muscle spasm of back    New Prescriptions New Prescriptions   HYDROCODONE-ACETAMINOPHEN (NORCO/VICODIN) 5-325 MG TABLET    Take 1-2 tablets by mouth every 6 (six) hours as needed.   METHOCARBAMOL (ROBAXIN) 500 MG TABLET    Take 1 tablet (500 mg total) by mouth 2 (two) times daily.     Gwyneth SproutWhitney Lydiah Pong, MD 03/14/16 1159

## 2016-03-14 NOTE — ED Notes (Signed)
MD at bedside. 

## 2016-05-22 ENCOUNTER — Encounter (HOSPITAL_BASED_OUTPATIENT_CLINIC_OR_DEPARTMENT_OTHER): Payer: Self-pay | Admitting: *Deleted

## 2016-05-22 ENCOUNTER — Emergency Department (HOSPITAL_BASED_OUTPATIENT_CLINIC_OR_DEPARTMENT_OTHER)
Admission: EM | Admit: 2016-05-22 | Discharge: 2016-05-22 | Disposition: A | Discharge status: Home / Self Care | Payer: BLUE CROSS/BLUE SHIELD | Attending: Emergency Medicine | Admitting: Emergency Medicine

## 2016-05-22 DIAGNOSIS — S299XXA Unspecified injury of thorax, initial encounter: Secondary | ICD-10-CM | POA: Diagnosis present

## 2016-05-22 DIAGNOSIS — Y990 Civilian activity done for income or pay: Secondary | ICD-10-CM | POA: Diagnosis not present

## 2016-05-22 DIAGNOSIS — Y929 Unspecified place or not applicable: Secondary | ICD-10-CM | POA: Diagnosis not present

## 2016-05-22 DIAGNOSIS — J45909 Unspecified asthma, uncomplicated: Secondary | ICD-10-CM | POA: Diagnosis not present

## 2016-05-22 DIAGNOSIS — F17210 Nicotine dependence, cigarettes, uncomplicated: Secondary | ICD-10-CM | POA: Insufficient documentation

## 2016-05-22 DIAGNOSIS — S29012A Strain of muscle and tendon of back wall of thorax, initial encounter: Secondary | ICD-10-CM | POA: Insufficient documentation

## 2016-05-22 DIAGNOSIS — Y93F2 Activity, caregiving, lifting: Secondary | ICD-10-CM | POA: Diagnosis not present

## 2016-05-22 DIAGNOSIS — X500XXA Overexertion from strenuous movement or load, initial encounter: Secondary | ICD-10-CM | POA: Diagnosis not present

## 2016-05-22 DIAGNOSIS — Z79899 Other long term (current) drug therapy: Secondary | ICD-10-CM | POA: Diagnosis not present

## 2016-05-22 MED ORDER — NAPROXEN 500 MG PO TABS: 500.0000 mg | ORAL_TABLET | Freq: Two times a day (BID) | ORAL | 0 refills | Status: DC | PRN

## 2016-05-22 MED ORDER — DEXAMETHASONE 6 MG PO TABS: 6.0000 mg | ORAL_TABLET | Freq: Two times a day (BID) | ORAL | 0 refills | Status: DC

## 2016-05-22 MED ORDER — CYCLOBENZAPRINE HCL 5 MG PO TABS: 5.0000 mg | ORAL_TABLET | Freq: Three times a day (TID) | ORAL | 0 refills | Status: DC | PRN

## 2016-05-22 MED FILL — NAPROXEN 500 MG TABLET: 500 | 5 days supply | Qty: 10 | Fill #0

## 2016-05-22 MED FILL — CYCLOBENZAPRINE 5 MG TABLET: 5 | 4 days supply | Qty: 12 | Fill #0

## 2016-05-22 MED FILL — DEXAMETHASONE 4 MG TABLET: 4 | 3 days supply | Qty: 9 | Fill #0

## 2016-05-22 NOTE — ED Triage Notes (Addendum)
Patient states she went to work this morning and reached for a pot and had a severe pain In the bilateral mid back, R > L.  Describes pain as sharp, shooting pain radiating from lower back to shoulder blades.  History of some chronic mild back pain, the pain today is different. 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

## 2016-05-29 NOTE — ED Provider Notes (Signed)
[facility]-EMERGENCY DEPT Provider Note   CSN: [id number] Arrival date & time: 05/22/16  0746     History   Chief Complaint Chief Complaint  Patient presents with  . Back Pain    HPI Diane Stein is a 31 y.o. female.  HPI   31 year old female with back pain. Mid right back. Onset earlier this morning. She is reaching out when she had acute onset of pain in this area. She describes the pain is sharp and radiates from her mid right back up into her shoulder blade. Worse with movement. No respiratory complaints. No numbness or tingling. No intervention prior to arrival.  Past Medical History:  Diagnosis Date  . Asthma   . Bronchitis   . Tachycardia 2016    Patient Active Problem List   Diagnosis Date Noted  . SVD (spontaneous vaginal delivery) 11/08/2014  . Irregular uterine contractions 11/07/2014  . Palpitations 05/12/2014  . Paroxysmal supraventricular tachycardia (HCC) 05/12/2014  . Pregnancy 05/12/2014    Past Surgical History:  Procedure Laterality Date  . TOOTH EXTRACTION Right 2008    OB History    Gravida Para Term Preterm AB Living   5 1 1   3 1    SAB TAB Ectopic Multiple Live Births   3     0 1       Home Medications    Prior to Admission medications   Medication Sig Start Date End Date Taking? Authorizing Provider  albuterol (PROVENTIL HFA;VENTOLIN HFA) 108 (90 BASE) MCG/ACT inhaler Inhale 2 puffs into the lungs every 6 (six) hours as needed for wheezing or shortness of breath.   Yes Historical Provider, MD  desvenlafaxine (PRISTIQ) 100 MG 24 hr tablet Take 100 mg by mouth daily.   Yes Historical Provider, MD  doxepin (SINEQUAN) 10 MG capsule Take 150 mg by mouth daily.    Yes Historical Provider, MD  LORazepam (ATIVAN) 1 MG tablet Take 1 mg by mouth every 8 (eight) hours as needed for anxiety.   Yes Historical Provider, MD  Norgestimate-Eth Estradiol (SPRINTEC 28 PO) Take by mouth.   Yes Historical Provider, MD  propranolol (INDERAL) 10 MG  tablet Take 10 mg by mouth 2 (two) times daily.   Yes Historical Provider, MD  cyclobenzaprine (FLEXERIL) 5 MG tablet Take 1 tablet (5 mg total) by mouth 3 (three) times daily as needed for muscle spasms. 05/22/16   Raeford RazorStephen Daemyn Gariepy, MD  dexamethasone (DECADRON) 6 MG tablet Take 1 tablet (6 mg total) by mouth 2 (two) times daily. 05/22/16   Raeford RazorStephen Margerie Fraiser, MD  HYDROcodone-acetaminophen (NORCO/VICODIN) 5-325 MG tablet Take 2 tablets by mouth every 4 (four) hours as needed. 03/12/16   Elson AreasLeslie K Sofia, PA-C  HYDROcodone-acetaminophen (NORCO/VICODIN) 5-325 MG tablet Take 1-2 tablets by mouth every 6 (six) hours as needed. 03/14/16   Gwyneth SproutWhitney Plunkett, MD  ibuprofen (ADVIL,MOTRIN) 800 MG tablet Take 1 tablet (800 mg total) by mouth 3 (three) times daily. 03/12/16   Elson AreasLeslie K Sofia, PA-C  methocarbamol (ROBAXIN) 500 MG tablet Take 1 tablet (500 mg total) by mouth 2 (two) times daily. 03/14/16   Gwyneth SproutWhitney Plunkett, MD  naproxen (NAPROSYN) 500 MG tablet Take 1 tablet (500 mg total) by mouth 2 (two) times daily as needed. 05/22/16   Raeford RazorStephen Kinzley Savell, MD  ondansetron (ZOFRAN) 4 MG tablet Take 1 tablet (4 mg total) by mouth every 6 (six) hours. 12/27/15   Santiago GladHeather Laisure, PA-C    Family History Family History  Problem Relation Age of Onset  . Hypertension Mother   .  Heart disease Father   . Heart disease Maternal Grandmother   . Hypertension Maternal Grandmother   . Heart disease Maternal Grandfather   . Hypertension Maternal Grandfather   . Heart disease Paternal Grandmother   . Hypertension Paternal Grandmother   . Heart disease Paternal Grandfather   . Hypertension Paternal Grandfather     Social History Social History  Substance Use Topics  . Smoking status: Current Every Day Smoker    Packs/day: 0.50    Types: Cigarettes  . Smokeless tobacco: Never Used  . Alcohol use No     Allergies   Codeine; Oxycodone; Oxycodone-acetaminophen; and Other   Review of Systems Review of Systems   Physical  Exam Updated Vital Signs BP 120/69 (BP Location: Right Arm)   Pulse 81   Temp 97.9 F (36.6 C) (Oral)   Resp 16   Ht 5\' 3"  (1.6 m)   Wt 156 lb (70.8 kg)   LMP 05/12/2016 (Exact Date) Comment: neg u preg in ed today   SpO2 97%   BMI 27.63 kg/m   Physical Exam  Constitutional: She appears well-developed and well-nourished. No distress.  HENT:  Head: Normocephalic and atraumatic.  Eyes: Conjunctivae are normal. Right eye exhibits no discharge. Left eye exhibits no discharge.  Neck: Neck supple.  Cardiovascular: Normal rate, regular rhythm and normal heart sounds.  Exam reveals no gallop and no friction rub.   No murmur heard. Pulmonary/Chest: Effort normal and breath sounds normal. No respiratory distress.  Abdominal: Soft. She exhibits no distension. There is no tenderness.  Musculoskeletal: She exhibits no edema or tenderness.  Mild tenderness to palpation in the right thoracic back in the mid to lower thoracic region. No midline spinal tenderness. No overlying skin changes. Sensation intact to light touch.  Neurological: She is alert.  Skin: Skin is warm and dry.  Psychiatric: She has a normal mood and affect. Her behavior is normal. Thought content normal.  Nursing note and vitals reviewed.    ED Treatments / Results  Labs (all labs ordered are listed, but only abnormal results are displayed) Labs Reviewed - No data to display  EKG  EKG Interpretation None       Radiology No results found.  Procedures Procedures (including critical care time)  Medications Ordered in ED Medications - No data to display   Initial Impression / Assessment and Plan / ED Course  I have reviewed the triage vital signs and the nursing notes.  Pertinent labs & imaging results that were available during my care of the patient were reviewed by me and considered in my medical decision making (see chart for details).  Clinical Course     31 year old female with right thoracic back  pain. Nonfocal exam. No respiratory complaints. Lungs are clear. Suspect strain. Plan symptomatically treatment. Return cautions discussed.  Final Clinical Impressions(s) / ED Diagnoses   Final diagnoses:  Strain of thoracic paraspinal muscles excluding T1 and T2 levels, initial encounter    New Prescriptions Discharge Medication List as of 05/22/2016  8:26 AM    START taking these medications   Details  cyclobenzaprine (FLEXERIL) 5 MG tablet Take 1 tablet (5 mg total) by mouth 3 (three) times daily as needed for muscle spasms., Starting Thu 05/22/2016, Print    dexamethasone (DECADRON) 6 MG tablet Take 1 tablet (6 mg total) by mouth 2 (two) times daily., Starting Thu 05/22/2016, Print    naproxen (NAPROSYN) 500 MG tablet Take 1 tablet (500 mg total) by mouth 2 (two)  times daily as needed., Starting Thu 05/22/2016, Print         Raeford Razor, MD 05/29/16 1348

## 2016-08-19 ENCOUNTER — Emergency Department (HOSPITAL_COMMUNITY): Payer: BLUE CROSS/BLUE SHIELD

## 2016-08-19 ENCOUNTER — Emergency Department (HOSPITAL_COMMUNITY)
Admission: EM | Admit: 2016-08-19 | Discharge: 2016-08-20 | Disposition: A | Discharge status: Home / Self Care | Payer: BLUE CROSS/BLUE SHIELD | Attending: Emergency Medicine | Admitting: Emergency Medicine

## 2016-08-19 DIAGNOSIS — R55 Syncope and collapse: Secondary | ICD-10-CM | POA: Diagnosis not present

## 2016-08-19 DIAGNOSIS — R0789 Other chest pain: Secondary | ICD-10-CM

## 2016-08-19 DIAGNOSIS — F17210 Nicotine dependence, cigarettes, uncomplicated: Secondary | ICD-10-CM | POA: Diagnosis not present

## 2016-08-19 DIAGNOSIS — Z7982 Long term (current) use of aspirin: Secondary | ICD-10-CM | POA: Insufficient documentation

## 2016-08-19 DIAGNOSIS — J45909 Unspecified asthma, uncomplicated: Secondary | ICD-10-CM | POA: Diagnosis not present

## 2016-08-19 DIAGNOSIS — R079 Chest pain, unspecified: Secondary | ICD-10-CM | POA: Diagnosis present

## 2016-08-19 LAB — CBC WITH DIFFERENTIAL/PLATELET
Basophils Absolute: 0 10*3/uL (ref 0.0–0.1)
Basophils Relative: 0 %
Eosinophils Absolute: 0.1 10*3/uL (ref 0.0–0.7)
Eosinophils Relative: 1 %
HEMATOCRIT: 38 % (ref 36.0–46.0)
HEMOGLOBIN: 12.6 g/dL (ref 12.0–15.0)
LYMPHS ABS: 3.5 10*3/uL (ref 0.7–4.0)
Lymphocytes Relative: 24 %
MCH: 31.2 pg (ref 26.0–34.0)
MCHC: 33.2 g/dL (ref 30.0–36.0)
MCV: 94.1 fL (ref 78.0–100.0)
MONOS PCT: 7 %
Monocytes Absolute: 1 10*3/uL (ref 0.1–1.0)
NEUTROS ABS: 10.1 10*3/uL — AB (ref 1.7–7.7)
NEUTROS PCT: 68 %
Platelets: 264 10*3/uL (ref 150–400)
RBC: 4.04 MIL/uL (ref 3.87–5.11)
RDW: 13.2 % (ref 11.5–15.5)
WBC: 14.7 10*3/uL — ABNORMAL HIGH (ref 4.0–10.5)

## 2016-08-19 LAB — COMPREHENSIVE METABOLIC PANEL
ALBUMIN: 3.3 g/dL — AB (ref 3.5–5.0)
ALT: 19 U/L (ref 14–54)
ANION GAP: 6 (ref 5–15)
AST: 17 U/L (ref 15–41)
Alkaline Phosphatase: 68 U/L (ref 38–126)
BUN: 10 mg/dL (ref 6–20)
CHLORIDE: 106 mmol/L (ref 101–111)
CO2: 26 mmol/L (ref 22–32)
CREATININE: 0.68 mg/dL (ref 0.44–1.00)
Calcium: 8.4 mg/dL — ABNORMAL LOW (ref 8.9–10.3)
GFR calc non Af Amer: >60 mL/min (ref >60)
GLUCOSE: 92 mg/dL (ref 65–99)
Potassium: 3.7 mmol/L (ref 3.5–5.1)
SODIUM: 138 mmol/L (ref 135–145)
Total Bilirubin: 0.6 mg/dL (ref 0.3–1.2)
Total Protein: 6.1 g/dL — ABNORMAL LOW (ref 6.5–8.1)

## 2016-08-19 LAB — I-STAT BETA HCG BLOOD, ED (MC, WL, AP ONLY): I-stat hCG, quantitative: <5 m[IU]/mL (ref <5)

## 2016-08-19 LAB — I-STAT TROPONIN, ED: Troponin i, poc: 0 ng/mL (ref 0.00–0.08)

## 2016-08-19 LAB — D-DIMER, QUANTITATIVE: D-Dimer, Quant: 2.54 ug/mL-FEU — ABNORMAL HIGH (ref 0.00–0.50)

## 2016-08-19 MED ORDER — IOPAMIDOL (ISOVUE-370) INJECTION 76%
INTRAVENOUS | Status: AC
Start: 2016-08-19 — End: 2016-08-20
  Administered 2016-08-20: 100 mL
  Filled 2016-08-19: qty 100

## 2016-08-19 MED ORDER — HYDROCODONE-ACETAMINOPHEN 5-325 MG PO TABS
1.0000 | ORAL_TABLET | Freq: Once | ORAL | Status: AC
  Administered 2016-08-20: 1 via ORAL
  Filled 2016-08-19: qty 1

## 2016-08-19 NOTE — ED Triage Notes (Addendum)
Pt arrived via ems from home this evening after having a syncopal episode following a shower. Pt experienced chest pain  That radiated to her back prior to taking shower. Pt states she "flipped her hair to dry it and briefly blacked out", falling to her knees thereafter. Pt denies striking her head. Pt was alert and oriented upon EMS arrival. Pt stated her chest pain continued and rated her pain at 6/10. Rated her back pain at 8/10. EMS gave 324 ASA and 1x Nitro en route without relief. Pt alert and oriented upon arrival to the ED.

## 2016-08-19 NOTE — ED Provider Notes (Signed)
[facility]-EMERGENCY DEPT Provider Note   CSN: [id number] Arrival date & time: 08/19/16  2126     History   Chief Complaint Chief Complaint  Patient presents with  . Loss of Consciousness  . Chest Pain    HPI Diane Stein is a 31 y.o. female.  The history is provided by the patient. No language interpreter was used.  Loss of Consciousness   Associated symptoms include chest pain.  Chest Pain   Associated symptoms include syncope.    Diane Stein is a 31 y.o. female who presents to the Emergency Department complaining of chest pain, syncope.  She reports developing central left-sided chest pain that started about 2 hours prior to ED arrival. It is also located in her left upper back. Pain is described as a vice and pressure-like sensation that is occasionally in her jaw. She took a shower and when she got a shower she had a brief syncopal event but no injuries in the event. She has associated mild nausea and mild shortness of breath. No prior similar symptoms. No fever, cough, abdominal pain, vomiting, leg swelling or pain. She is an occasional smoker and has a history of hyperlipidemia. She takes birth control. Her mother has a history of coronary artery disease diagnosed in her late 2550s. She does take propanolol for history of tachycardia.  Past Medical History:  Diagnosis Date  . Asthma   . Bronchitis   . Tachycardia 2016    Patient Active Problem List   Diagnosis Date Noted  . SVD (spontaneous vaginal delivery) 11/08/2014  . Irregular uterine contractions 11/07/2014  . Palpitations 05/12/2014  . Paroxysmal supraventricular tachycardia (HCC) 05/12/2014  . Pregnancy 05/12/2014    Past Surgical History:  Procedure Laterality Date  . TOOTH EXTRACTION Right 2008    OB History    Gravida Para Term Preterm AB Living   5 1 1   3 1    SAB TAB Ectopic Multiple Live Births   3     0 1       Home Medications    Prior to Admission medications   Medication Sig  Start Date End Date Taking? Authorizing Provider  ALAYCEN 1/35 tablet Take 1 tablet by mouth daily. 08/14/16  Yes Historical Provider, MD  ALPRAZolam (XANAX XR) 1 MG 24 hr tablet Take 1 mg by mouth at bedtime. 08/16/16  Yes Historical Provider, MD  Aspirin-Acetaminophen-Caffeine (GOODY HEADACHE PO) Take 1 Package by mouth daily as needed (pain).   Yes Historical Provider, MD  desvenlafaxine (PRISTIQ) 100 MG 24 hr tablet Take 100 mg by mouth daily.   Yes Historical Provider, MD  propranolol ER (INDERAL LA) 80 MG 24 hr capsule Take 80 mg by mouth daily.   Yes Historical Provider, MD  varenicline (CHANTIX) 1 MG tablet Take 1 mg by mouth 2 (two) times daily.   Yes Historical Provider, MD  desvenlafaxine (PRISTIQ) 50 MG 24 hr tablet Take 50 mg by mouth daily.    Historical Provider, MD  FLUoxetine (PROZAC) 10 MG tablet Take 10 mg by mouth daily.    Historical Provider, MD    Family History Family History  Problem Relation Age of Onset  . Hypertension Mother   . Heart disease Father   . Heart disease Maternal Grandmother   . Hypertension Maternal Grandmother   . Heart disease Maternal Grandfather   . Hypertension Maternal Grandfather   . Heart disease Paternal Grandmother   . Hypertension Paternal Grandmother   . Heart disease Paternal Grandfather   .  Hypertension Paternal Grandfather     Social History Social History  Substance Use Topics  . Smoking status: Current Every Day Smoker    Packs/day: 0.50    Types: Cigarettes  . Smokeless tobacco: Never Used  . Alcohol use No     Allergies   Codeine; Oxycodone; Oxycodone-acetaminophen; and Other   Review of Systems Review of Systems  Cardiovascular: Positive for chest pain and syncope.  All other systems reviewed and are negative.    Physical Exam Updated Vital Signs BP 107/73   Pulse 70   Temp 98.3 F (36.8 C) (Oral)   Resp 17   Ht 5\' 3"  (1.6 m)   Wt 150 lb (68 kg)   LMP 05/12/2016 (Exact Date) Comment: neg u preg in ed  today   SpO2 100%   BMI 26.57 kg/m   Physical Exam  Constitutional: She is oriented to person, place, and time. She appears well-developed and well-nourished.  HENT:  Head: Normocephalic and atraumatic.  Cardiovascular: Normal rate and regular rhythm.   No murmur heard. Pulmonary/Chest: Effort normal and breath sounds normal. No respiratory distress.  Tender to palpation over the left mid chest wall without any rashes or swelling.  Abdominal: Soft. There is no tenderness. There is no rebound and no guarding.  Musculoskeletal: She exhibits no edema or tenderness.  Neurological: She is alert and oriented to person, place, and time.  Skin: Skin is warm and dry.  Psychiatric: She has a normal mood and affect. Her behavior is normal.  Nursing note and vitals reviewed.    ED Treatments / Results  Labs (all labs ordered are listed, but only abnormal results are displayed) Labs Reviewed  COMPREHENSIVE METABOLIC PANEL - Abnormal; Notable for the following:       Result Value   Calcium 8.4 (*)    Total Protein 6.1 (*)    Albumin 3.3 (*)    All other components within normal limits  CBC WITH DIFFERENTIAL/PLATELET - Abnormal; Notable for the following:    WBC 14.7 (*)    Neutro Abs 10.1 (*)    All other components within normal limits  D-DIMER, QUANTITATIVE (NOT AT [facility]) - Abnormal; Notable for the following:    D-Dimer, Quant 2.54 (*)    All other components within normal limits  I-STAT TROPOININ, ED  I-STAT BETA HCG BLOOD, ED ([facility], [facility], [facility] ONLY)  I-STAT TROPOININ, ED    EKG  EKG Interpretation  Date/Time:  Tuesday August 19 2016 21:33:15 EDT Ventricular Rate:  67 PR Interval:    QRS Duration: 89 QT Interval:  388 QTC Calculation: 410 R Axis:   83 Text Interpretation:  Sinus rhythm Confirmed by Lincoln Brigham [phone]) on 08/19/2016 9:40:18 PM       Radiology Dg Chest 2 View  Result Date: 08/19/2016 CLINICAL DATA:  Central chest pain.  Syncope. EXAM: CHEST  2 VIEW COMPARISON:   03/12/2016 FINDINGS: The cardiomediastinal contours are normal. The lungs are clear. Pulmonary vasculature is normal. No consolidation, pleural effusion, or pneumothorax. No acute osseous abnormalities are seen. IMPRESSION: No acute abnormality. Electronically Signed   By: Rubye Oaks M.D.   On: 08/19/2016 22:12   Ct Angio Chest Pe W/cm &/or Wo Cm  Result Date: 08/20/2016 CLINICAL DATA:  Left-sided chest pain, onset tonight.  Syncope. EXAM: CT ANGIOGRAPHY CHEST WITH CONTRAST TECHNIQUE: Multidetector CT imaging of the chest was performed using the standard protocol during bolus administration of intravenous contrast. Multiplanar CT image reconstructions and MIPs were obtained to evaluate  the vascular anatomy. CONTRAST:  100 cc Isovue 370 IV COMPARISON:  Chest radiographs 2 hours prior. FINDINGS: Cardiovascular: There are no filling defects within the pulmonary arteries to suggest pulmonary embolus. Thoracic aorta is normal in caliber. No aneurysm or evidence of dissection. Heart is normal in size. Mediastinum/Nodes: No mediastinal or hilar adenopathy. No pericardial effusion. The esophagus is decompressed. Visualized thyroid gland is unremarkable. Lungs/Pleura: The lungs are clear. No consolidation, pleural fluid or pulmonary edema. No pulmonary mass or suspicious nodule. Upper Abdomen: No acute abnormality. Musculoskeletal: No chest wall abnormality. No acute or significant osseous findings. Review of the MIP images confirms the above findings. IMPRESSION: No pulmonary embolus or acute abnormality. Unremarkable CTA of the chest. Electronically Signed   By: Rubye Oaks M.D.   On: 08/20/2016 00:30    Procedures Procedures (including critical care time)  Medications Ordered in ED Medications  HYDROcodone-acetaminophen (NORCO/VICODIN) 5-325 MG per tablet 1 tablet (1 tablet Oral Given 08/20/16 0038)  iopamidol (ISOVUE-370) 76 % injection (100 mLs  Contrast Given 08/20/16 0000)     Initial  Impression / Assessment and Plan / ED Course  I have reviewed the triage vital signs and the nursing notes.  Pertinent labs & imaging results that were available during my care of the patient were reviewed by me and considered in my medical decision making (see chart for details).     Patient here for evaluation of chest pain and syncopal event. Pain is reproducible on palpation in the emergency department. D-dimer is elevated and CT PE study was obtained that was negative for acute PE. Presentation is not consistent with dissection, subarachnoid hemorrhage, ACS, CHF, pneumonia. Following treatment with pain medications in the department her chest pain has improved. Consultation on home care for syncope as well as chest pain. Discussed PCP follow-up for possible Holter monitoring. Return precautions discussed as well.  Final Clinical Impressions(s) / ED Diagnoses   Final diagnoses:  Chest wall pain  Syncope and collapse    New Prescriptions New Prescriptions   No medications on file     Tilden Fossa, MD 08/20/16 [phone]

## 2016-08-19 NOTE — ED Notes (Signed)
ED Provider at bedside. 

## 2016-08-19 NOTE — ED Notes (Signed)
Pt given coke per EDP

## 2016-08-20 ENCOUNTER — Emergency Department (HOSPITAL_COMMUNITY): Payer: BLUE CROSS/BLUE SHIELD

## 2016-08-20 ENCOUNTER — Other Ambulatory Visit (HOSPITAL_COMMUNITY): Payer: Self-pay

## 2016-08-20 LAB — I-STAT TROPONIN, ED: Troponin i, poc: 0 ng/mL (ref 0.00–0.08)

## 2016-08-20 NOTE — Discharge Instructions (Signed)
Please follow-up with your family doctor for further testing and possible Holter monitor. Do not drive until your doctor states that it is okay freed to drive. Get rechecked immediately if you develop fevers, difficulty breathing or new and worrisome symptoms.

## 2016-08-20 NOTE — ED Notes (Signed)
Patient transported to CT 

## 2016-08-20 NOTE — ED Provider Notes (Signed)
2:02 AM D/c home per prior plan with Dr. Madilyn Hookees. Well appearing, no current complaints. Workup unremarkable. Discussed return precautions and need for follow up.   Pricilla LovelessScott Lilyana Lippman, MD 08/20/16 [phone]

## 2016-08-20 NOTE — ED Notes (Signed)
Nurse drawing labs. 

## 2016-08-23 ENCOUNTER — Encounter (HOSPITAL_COMMUNITY): Payer: Self-pay

## 2016-08-23 ENCOUNTER — Observation Stay (HOSPITAL_COMMUNITY)
Admission: EM | Admit: 2016-08-23 | Discharge: 2016-08-24 | Disposition: A | Discharge status: Home / Self Care | Payer: BLUE CROSS/BLUE SHIELD | Attending: Internal Medicine | Admitting: Internal Medicine

## 2016-08-23 ENCOUNTER — Emergency Department (HOSPITAL_COMMUNITY): Payer: BLUE CROSS/BLUE SHIELD

## 2016-08-23 DIAGNOSIS — R42 Dizziness and giddiness: Secondary | ICD-10-CM | POA: Diagnosis not present

## 2016-08-23 DIAGNOSIS — I471 Supraventricular tachycardia, unspecified: Secondary | ICD-10-CM | POA: Diagnosis present

## 2016-08-23 DIAGNOSIS — F17210 Nicotine dependence, cigarettes, uncomplicated: Secondary | ICD-10-CM | POA: Diagnosis not present

## 2016-08-23 DIAGNOSIS — Z8249 Family history of ischemic heart disease and other diseases of the circulatory system: Secondary | ICD-10-CM | POA: Diagnosis not present

## 2016-08-23 DIAGNOSIS — R51 Headache: Secondary | ICD-10-CM | POA: Insufficient documentation

## 2016-08-23 DIAGNOSIS — R55 Syncope and collapse: Secondary | ICD-10-CM | POA: Diagnosis not present

## 2016-08-23 DIAGNOSIS — F329 Major depressive disorder, single episode, unspecified: Secondary | ICD-10-CM | POA: Insufficient documentation

## 2016-08-23 DIAGNOSIS — F32A Depression, unspecified: Secondary | ICD-10-CM | POA: Diagnosis present

## 2016-08-23 DIAGNOSIS — Z7982 Long term (current) use of aspirin: Secondary | ICD-10-CM | POA: Insufficient documentation

## 2016-08-23 DIAGNOSIS — F419 Anxiety disorder, unspecified: Secondary | ICD-10-CM | POA: Diagnosis present

## 2016-08-23 DIAGNOSIS — J45909 Unspecified asthma, uncomplicated: Secondary | ICD-10-CM | POA: Insufficient documentation

## 2016-08-23 DIAGNOSIS — R0789 Other chest pain: Secondary | ICD-10-CM | POA: Diagnosis not present

## 2016-08-23 DIAGNOSIS — I252 Old myocardial infarction: Secondary | ICD-10-CM | POA: Insufficient documentation

## 2016-08-23 HISTORY — DX: Major depressive disorder, single episode, unspecified: F32.9

## 2016-08-23 HISTORY — DX: Anxiety disorder, unspecified: F41.9

## 2016-08-23 HISTORY — DX: Depression, unspecified: F32.A

## 2016-08-23 LAB — I-STAT BETA HCG BLOOD, ED (MC, WL, AP ONLY)

## 2016-08-23 LAB — CBC WITH DIFFERENTIAL/PLATELET
Basophils Absolute: 0 K/uL (ref 0.0–0.1)
Basophils Relative: 0 %
Eosinophils Absolute: 0.1 K/uL (ref 0.0–0.7)
Eosinophils Relative: 1 %
HCT: 37.9 % (ref 36.0–46.0)
Hemoglobin: 12.7 g/dL (ref 12.0–15.0)
Lymphocytes Relative: 28 %
Lymphs Abs: 3.2 K/uL (ref 0.7–4.0)
MCH: 31.2 pg (ref 26.0–34.0)
MCHC: 33.5 g/dL (ref 30.0–36.0)
MCV: 93.1 fL (ref 78.0–100.0)
Monocytes Absolute: 0.9 K/uL (ref 0.1–1.0)
Monocytes Relative: 8 %
Neutro Abs: 7.3 K/uL (ref 1.7–7.7)
Neutrophils Relative %: 63 %
Platelets: 281 K/uL (ref 150–400)
RBC: 4.07 MIL/uL (ref 3.87–5.11)
RDW: 13.3 % (ref 11.5–15.5)
WBC: 11.6 K/uL — ABNORMAL HIGH (ref 4.0–10.5)

## 2016-08-23 LAB — COMPREHENSIVE METABOLIC PANEL
ALBUMIN: 3.4 g/dL — AB (ref 3.5–5.0)
ALT: 20 U/L (ref 14–54)
ANION GAP: 6 (ref 5–15)
AST: 19 U/L (ref 15–41)
Alkaline Phosphatase: 67 U/L (ref 38–126)
BILIRUBIN TOTAL: 0.4 mg/dL (ref 0.3–1.2)
BUN: 7 mg/dL (ref 6–20)
CO2: 23 mmol/L (ref 22–32)
Calcium: 8.8 mg/dL — ABNORMAL LOW (ref 8.9–10.3)
Chloride: 106 mmol/L (ref 101–111)
Creatinine, Ser: 0.59 mg/dL (ref 0.44–1.00)
GFR calc non Af Amer: >60 mL/min (ref >60)
GLUCOSE: 82 mg/dL (ref 65–99)
Potassium: 3.9 mmol/L (ref 3.5–5.1)
Sodium: 135 mmol/L (ref 135–145)
TOTAL PROTEIN: 6.4 g/dL — AB (ref 6.5–8.1)

## 2016-08-23 LAB — MAGNESIUM: MAGNESIUM: 2 mg/dL (ref 1.7–2.4)

## 2016-08-23 LAB — I-STAT TROPONIN, ED: Troponin i, poc: 0 ng/mL (ref 0.00–0.08)

## 2016-08-23 LAB — TROPONIN I

## 2016-08-23 MED ORDER — VARENICLINE TARTRATE 1 MG PO TABS
1.0000 mg | ORAL_TABLET | Freq: Two times a day (BID) | ORAL | Status: DC
  Administered 2016-08-24: 1 mg via ORAL
  Filled 2016-08-23: qty 1

## 2016-08-23 MED ORDER — SODIUM CHLORIDE 0.9 % IV SOLN
INTRAVENOUS | Status: AC
  Administered 2016-08-23: via INTRAVENOUS

## 2016-08-23 MED ORDER — ENOXAPARIN SODIUM 40 MG/0.4ML ~~LOC~~ SOLN
40.0000 mg | Freq: Every day | SUBCUTANEOUS | Status: DC
  Administered 2016-08-24: 40 mg via SUBCUTANEOUS
  Filled 2016-08-23: qty 0.4

## 2016-08-23 MED ORDER — FLUOXETINE HCL 20 MG PO CAPS
20.0000 mg | ORAL_CAPSULE | Freq: Every day | ORAL | Status: DC
  Administered 2016-08-24: 20 mg via ORAL
  Filled 2016-08-23: qty 1

## 2016-08-23 MED ORDER — IBUPROFEN 600 MG PO TABS: 600.0000 mg | ORAL_TABLET | Freq: Four times a day (QID) | ORAL | Status: DC | PRN

## 2016-08-23 MED ORDER — METOCLOPRAMIDE HCL 5 MG/ML IJ SOLN
10.0000 mg | Freq: Once | INTRAMUSCULAR | Status: AC
  Administered 2016-08-23: 10 mg via INTRAVENOUS
  Filled 2016-08-23: qty 2

## 2016-08-23 MED ORDER — MECLIZINE HCL 25 MG PO TABS
12.5000 mg | ORAL_TABLET | Freq: Once | ORAL | Status: AC
  Administered 2016-08-23: 12.5 mg via ORAL
  Filled 2016-08-23: qty 1

## 2016-08-23 MED ORDER — ALPRAZOLAM 0.5 MG PO TABS
0.5000 mg | ORAL_TABLET | Freq: Two times a day (BID) | ORAL | Status: DC
  Administered 2016-08-23 – 2016-08-24 (×2): 0.5 mg via ORAL
  Filled 2016-08-23 (×2): qty 1

## 2016-08-23 MED ORDER — PROPRANOLOL HCL ER 80 MG PO CP24
80.0000 mg | ORAL_CAPSULE | Freq: Every day | ORAL | Status: DC
  Filled 2016-08-23: qty 1

## 2016-08-23 MED ORDER — NORETHINDRONE-ETH ESTRADIOL 1-35 MG-MCG PO TABS
1.0000 | ORAL_TABLET | Freq: Every day | ORAL | Status: DC
  Administered 2016-08-24: 1 via ORAL

## 2016-08-23 MED ORDER — DIPHENHYDRAMINE HCL 50 MG/ML IJ SOLN
25.0000 mg | Freq: Once | INTRAMUSCULAR | Status: DC
  Filled 2016-08-23: qty 1

## 2016-08-23 MED ORDER — SODIUM CHLORIDE 0.9% FLUSH
3.0000 mL | Freq: Two times a day (BID) | INTRAVENOUS | Status: DC
  Administered 2016-08-23: 3 mL via INTRAVENOUS

## 2016-08-23 MED ORDER — ALUM & MAG HYDROXIDE-SIMETH 200-200-20 MG/5ML PO SUSP: 30.0000 mL | Freq: Four times a day (QID) | ORAL | Status: DC | PRN

## 2016-08-23 MED ORDER — FENTANYL CITRATE (PF) 100 MCG/2ML IJ SOLN
50.0000 ug | Freq: Once | INTRAMUSCULAR | Status: AC
  Administered 2016-08-23: 50 ug via INTRAVENOUS
  Filled 2016-08-23: qty 2

## 2016-08-23 MED ORDER — SODIUM CHLORIDE 0.9 % IV BOLUS (SEPSIS)
1000.0000 mL | Freq: Once | INTRAVENOUS | Status: AC
  Administered 2016-08-23: 1000 mL via INTRAVENOUS

## 2016-08-23 NOTE — ED Provider Notes (Signed)
[facility]-EMERGENCY DEPT Provider Note   CSN: [id number] Arrival date & time: 08/23/16  2956     History   Chief Complaint Chief Complaint  Patient presents with  . Dizziness  . Otalgia  . Loss of Consciousness  . Headache    HPI Diane Stein is a 31 y.o. female.  HPI   31 year old female with past medical history of PSVT here with syncopal episode 4. The patient was just seen several days ago for chest pain and syncopal episode. She had a negative PE study and workup at that timedischarge him. Since then, the patient reports 3 additional episodes of syncope. On each of these episodes, she reports awakening on the ground. She had no preceding prodrome. She states that these did not occur with position changes. She also endorses generalized fatigue. Of note, this occurs in the setting of recently decreasing her Ativan dosing as well as increasing her Prozac dosing. Denies any current chest pain. She does have a left-sided aching, throbbing headache. No blood thinners.  Past Medical History:  Diagnosis Date  . Anxiety   . Asthma   . Bronchitis   . Depression   . Tachycardia 2016    Patient Active Problem List   Diagnosis Date Noted  . Syncope and collapse 08/23/2016  . Depression   . Anxiety   . SVD (spontaneous vaginal delivery) 11/08/2014  . Irregular uterine contractions 11/07/2014  . Palpitations 05/12/2014  . Paroxysmal supraventricular tachycardia (HCC) 05/12/2014  . Pregnancy 05/12/2014    Past Surgical History:  Procedure Laterality Date  . TOOTH EXTRACTION Right 2008    OB History    Gravida Para Term Preterm AB Living   SAB TAB Ectopic Multiple Live Births   3     0 1       Home Medications    Prior to Admission medications   Medication Sig Start Date End Date Taking? Authorizing Provider  ALPRAZolam (XANAX XR) 1 MG 24 hr tablet Take 1 mg by mouth at bedtime. 08/16/16  Yes Historical Provider, MD  Aspirin-Acetaminophen-Caffeine  (GOODY HEADACHE PO) Take 1 packet by mouth daily as needed (pain/headache).    Yes Historical Provider, MD  FLUoxetine (PROZAC) 10 MG capsule Take 10-20 mg by mouth See admin instructions. Filled 08/16/16: take 1 capsule (10 mg) by mouth daily for 1 week, then take 2 capsules (20 mg) daily 08/16/16  Yes Historical Provider, MD  norethindrone-ethinyl estradiol 1/35 (ALAYCEN 1/35) tablet Take 1 tablet by mouth daily.   Yes Historical Provider, MD  propranolol ER (INDERAL LA) 80 MG 24 hr capsule Take 80 mg by mouth daily.   Yes Historical Provider, MD  varenicline (CHANTIX) 1 MG tablet Take 1 mg by mouth 2 (two) times daily.   Yes Historical Provider, MD    Family History Family History  Problem Relation Age of Onset  . Hypertension Mother   . Heart disease Father   . Heart disease Maternal Grandmother   . Hypertension Maternal Grandmother   . Heart disease Maternal Grandfather   . Hypertension Maternal Grandfather   . Heart disease Paternal Grandmother   . Hypertension Paternal Grandmother   . Heart disease Paternal Grandfather   . Hypertension Paternal Grandfather     Social History Social History  Substance Use Topics  . Smoking status: Current Every Day Smoker    Packs/day: 0.50    Types: Cigarettes  . Smokeless tobacco: Never Used  . Alcohol use No  Allergies   Codeine; Oxycodone; Oxycodone-acetaminophen; Other; and Benadryl [diphenhydramine hcl]   Review of Systems Review of Systems  Constitutional: Positive for fatigue. Negative for chills and fever.  HENT: Negative for congestion, rhinorrhea and sore throat.   Eyes: Negative for visual disturbance.  Respiratory: Negative for cough, shortness of breath and wheezing.   Cardiovascular: Negative for chest pain and leg swelling.  Gastrointestinal: Negative for abdominal pain, diarrhea, nausea and vomiting.  Genitourinary: Negative for dysuria, flank pain, vaginal bleeding and vaginal discharge.  Musculoskeletal:  Negative for neck pain.  Skin: Negative for rash.  Allergic/Immunologic: Negative for immunocompromised state.  Neurological: Positive for syncope and light-headedness. Negative for headaches.  Hematological: Does not bruise/bleed easily.  All other systems reviewed and are negative.    Physical Exam Updated Vital Signs BP (!) 102/52 (BP Location: Left Arm)   Pulse 71   Temp 98 F (36.7 C) (Oral)   Resp 18   Ht  (1.6 m)   Wt 161 lb 3.2 oz (73.1 kg)   LMP 07/28/2016   SpO2 98%   Breastfeeding? Unknown   BMI 28.56 kg/m   Physical Exam  Constitutional: She is oriented to person, place, and time. She appears well-developed and well-nourished. No distress.  HENT:  Head: Normocephalic and atraumatic.  Eyes: Conjunctivae are normal.  Neck: Neck supple.  Cardiovascular: Normal rate, regular rhythm and normal heart sounds.  Exam reveals no friction rub.   No murmur heard. Pulmonary/Chest: Effort normal and breath sounds normal. No respiratory distress. She has no wheezes. She has no rales.  Abdominal: She exhibits no distension.  Musculoskeletal: She exhibits no edema.  Neurological: She is alert and oriented to person, place, and time. She has normal strength. She is not disoriented. No cranial nerve deficit or sensory deficit. She exhibits normal muscle tone. GCS eye subscore is 4. GCS verbal subscore is 5. GCS motor subscore is 6.  Skin: Skin is warm. Capillary refill takes less than 2 seconds.  Psychiatric: She has a normal mood and affect.  Nursing note and vitals reviewed.    ED Treatments / Results  Labs (all labs ordered are listed, but only abnormal results are displayed) Labs Reviewed  CBC WITH DIFFERENTIAL/PLATELET - Abnormal; Notable for the following:       Result Value   WBC 11.6 (*)    All other components within normal limits  COMPREHENSIVE METABOLIC PANEL - Abnormal; Notable for the following:    Calcium 8.8 (*)    Total Protein 6.4 (*)    Albumin 3.4  (*)    All other components within normal limits  BASIC METABOLIC PANEL - Abnormal; Notable for the following:    Potassium 3.4 (*)    Calcium 7.9 (*)    All other components within normal limits  CBC - Abnormal; Notable for the following:    RBC 3.73 (*)    Hemoglobin 11.6 (*)    HCT 35.0 (*)    All other components within normal limits  GLUCOSE, CAPILLARY - Abnormal; Notable for the following:    Glucose-Capillary 118 (*)    All other components within normal limits  MAGNESIUM  TROPONIN I  TROPONIN I  GLUCOSE, CAPILLARY  HIV ANTIBODY (ROUTINE TESTING)  I-STAT TROPOININ, ED  I-STAT BETA HCG BLOOD, ED ([facility], [facility], [facility] ONLY)    EKG  EKG Interpretation  Date/Time:  Saturday August 23 2016 18:46:21 EDT Ventricular Rate:  66 PR Interval:    QRS Duration: 86 QT Interval:  390  QTC Calculation: 409 R Axis:   103 Text Interpretation:  Normal sinus rhythm No significant change since last tracing Confirmed by Thereasa Iannello MD, Antonious Omahoney [phone]) on 08/23/2016 9:00:04 PM       Radiology Ct Head Wo Contrast  Result Date: 08/23/2016 CLINICAL DATA:  Syncope.  Loss of consciousness. EXAM: CT HEAD WITHOUT CONTRAST TECHNIQUE: Contiguous axial images were obtained from the base of the skull through the vertex without intravenous contrast. COMPARISON:  None. FINDINGS: Brain: No evidence of acute infarction, hemorrhage, hydrocephalus, extra-axial collection or mass lesion/mass effect. Vascular: No hyperdense vessel or unexpected calcification. Skull: Normal. Negative for fracture or focal lesion. Sinuses/Orbits: No acute finding. Other: None. IMPRESSION: Normal brain. Electronically Signed   By: Signa Kell M.D.   On: 08/23/2016 19:53    Procedures Procedures (including critical care time)  Medications Ordered in ED Medications  FLUoxetine (PROZAC) capsule 20 mg (20 mg Oral Given 08/24/16 0905)  norethindrone-ethinyl estradiol 1/35 (ORTHO-NOVUM, NORTREL,CYCLAFEM) 1-35 MG-MCG per tablet 1 tablet (1  tablet Oral Given 08/24/16 1000)  ALPRAZolam (XANAX) tablet 0.5 mg (0.5 mg Oral Given 08/24/16 0905)  propranolol ER (INDERAL LA) 24 hr capsule 80 mg (80 mg Oral Not Given 08/24/16 0956)  varenicline (CHANTIX) tablet 1 mg (1 mg Oral Given 08/24/16 0649)  sodium chloride flush (NS) 0.9 % injection 3 mL (3 mLs Intravenous Not Given 08/24/16 0900)  enoxaparin (LOVENOX) injection 40 mg (40 mg Subcutaneous Given 08/24/16 0906)  0.9 %  sodium chloride infusion ( Intravenous Stopped 08/24/16 0830)  ibuprofen (ADVIL,MOTRIN) tablet 600 mg (not administered)  alum & mag hydroxide-simeth (MAALOX/MYLANTA) 200-200-20 MG/5ML suspension 30 mL (not administered)  metoCLOPramide (REGLAN) injection 10 mg (10 mg Intravenous Given 08/23/16 1949)  sodium chloride 0.9 % bolus 1,000 mL (0 mLs Intravenous Stopped 08/23/16 2226)  fentaNYL (SUBLIMAZE) injection 50 mcg (50 mcg Intravenous Given 08/23/16 2232)  meclizine (ANTIVERT) tablet 12.5 mg (12.5 mg Oral Given 08/23/16 2225)  potassium chloride SA (K-DUR,KLOR-CON) CR tablet 60 mEq (60 mEq Oral Given 08/24/16 0833)  sodium chloride 0.9 % bolus 1,000 mL (1,000 mLs Intravenous Given 08/24/16 0833)  HYDROcodone-acetaminophen (NORCO/VICODIN) 5-325 MG per tablet 1 tablet (1 tablet Oral Given 08/24/16 1037)     Initial Impression / Assessment and Plan / ED Course  I have reviewed the triage vital signs and the nursing notes.  Pertinent labs & imaging results that were available during my care of the patient were reviewed by me and considered in my medical decision making (see chart for details).     31 year old female with past medical history of PSVT here with syncopal episode 4 in the setting of recent medication changes. On arrival, her vital signs are stable. She has no focal neurological deficits. She is reportedly orthostatic with EMS but all of her syncopal episodes did not occur with position changes and she had no prodrome. Concern for possible arrhythmia. This is especially  concerning in the setting of recently changing her antidepressants, which can cause QT abnormalities. EKG here is nonischemic. Do not feel repeat PE workup is indicated given recent negative CT angiogram and no clinical evidence of DVT. However, given her concerning history, I believe observation is warranted.  Final Clinical Impressions(s) / ED Diagnoses   Final diagnoses:  Depression, unspecified depression type  Anxiety    New Prescriptions Discharge Medication List as of 08/24/2016 11:15 AM       Shaune Pollack, MD 08/24/16 1130

## 2016-08-23 NOTE — ED Triage Notes (Signed)
Pt stopped Xanax 5 days ago, started ES Xanax for sleep, started Prozac 5 days ago, decreased Pristiq.

## 2016-08-23 NOTE — ED Triage Notes (Signed)
Onset 4 days headache left side of head, intermittant dizziness, bilateral ear pain.  Endorses LOC x 4 in past 4 days.  No known injuries.  EMS advised + orthostatic VS.

## 2016-08-23 NOTE — H&P (Signed)
History and Physical    Diane Stein ZOX:[id number] DOB: 05-10-1986 DOA: 08/23/2016  PCP: No PCP Per Patient  Patient coming from: Home  Chief Complaint: Passing out, chest pain  HPI: Diane Stein is a 31 y.o. female with medical history significant of SVT on propranolol, Depression/anxiety, childhood asthma who presents for chest pain and syncope.  She was seen in the ED on 08/19/16 for similar symptoms.  D dimer was elevated at the time, CT chest was negative for PE. Her pain improved with pain medication in the ED and she was sent home.  Since that time, she has had 3 more syncopal events and intermittent chest pain.  She notes that the syncopal events come without prodrome, she will be doing her normal activity and then wake up on the floor.  She has no vertigo, dizziness or lightheadedness.  She reports always having palpitations due to her SVT and this has not changed.  She has had SVT "as long as she can remember."  She previously saw Dr. Anne Fu while she was pregnant about 2 years ago and had a holter and TTE which she reports were reassuring. She took her propranolol yesterday and her pulse is in the 60s-80s in the ED.  Per EMS, she was noted to be orthostatic and BP has been lower in the ED.  Associated symptoms include ear pain which has been going on for about 5 days.  She notes no "new" drainage, no erythema.  The pain feels deep like an ear infection.  She denies other URI symptoms.   The chest pain she reports is like a tightness, vice-like in the center of her chest, radiates to jaw and back and with some associated tingling in her left hand.  She denies nausea, diaphoresis.  She has recently had changes in her medications for anxiety with discontinuation of her daily xanax which she took PRN and increase of her prozac.  She continue to take long acting xanax  at night.  She has been on a BZD for about 2 years now for panic attacks.  She has had increased anxiety and panic  attacks with these changes.   She is taking chantix for smoking cessation and now only smokes a few cigarettes per day.  Per chart review, she had an episode of sinusitis in January of this year, treated with Abx.  She has no FH of sudden cardiac death at a young age.  She had a PGF who passed from an MI at 30yo.  ED Course: In the ED, she was noted to be in NSR on EKG, noted to be orthostatic by EMS with SBP in the 90s, given fluids.  She has had no further symptoms.    Review of Systems: As per HPI otherwise 10 point review of systems negative.   Past Medical History:  Diagnosis Date  . Anxiety   . Asthma   . Bronchitis   . Depression   . Tachycardia 2016    Past Surgical History:  Procedure Laterality Date  . TOOTH EXTRACTION Right 2008   Reviewed with patient  reports that she has been smoking Cigarettes.  She has been smoking about 0.50 packs per day. She has never used smokeless tobacco. She reports that she does not drink alcohol or use drugs.  Allergies  Allergen Reactions  . Codeine Anaphylaxis  . Oxycodone Anaphylaxis  . Oxycodone-Acetaminophen Hives, Shortness Of Breath and Palpitations  . Other Hives    Allergic to pine nuts  . Benadryl [  Diphenhydramine Hcl] Other (See Comments)    Restless leg    Reviewed with patient Family History  Problem Relation Age of Onset  . Hypertension Mother   . Heart disease Father   . Heart disease Maternal Grandmother   . Hypertension Maternal Grandmother   . Heart disease Maternal Grandfather   . Hypertension Maternal Grandfather   . Heart disease Paternal Grandmother   . Hypertension Paternal Grandmother   . Heart disease Paternal Grandfather   . Hypertension Paternal Grandfather     Prior to Admission medications   Medication Sig Start Date End Date Taking? Authorizing Provider  ALPRAZolam (XANAX XR) 1 MG 24 hr tablet Take 1 mg by mouth at bedtime. 08/16/16  Yes Historical Provider, MD  Aspirin-Acetaminophen-Caffeine  (GOODY HEADACHE PO) Take 1 packet by mouth daily as needed (pain/headache).    Yes Historical Provider, MD  FLUoxetine (PROZAC) 10 MG capsule Take 10-20 mg by mouth See admin instructions. Filled 08/16/16: take 1 capsule (10 mg) by mouth daily for 1 week, then take 2 capsules (20 mg) daily 08/16/16  Yes Historical Provider, MD  norethindrone-ethinyl estradiol 1/35 (ALAYCEN 1/35) tablet Take 1 tablet by mouth daily.   Yes Historical Provider, MD  propranolol ER (INDERAL LA) 80 MG 24 hr capsule Take 80 mg by mouth daily.   Yes Historical Provider, MD  varenicline (CHANTIX) 1 MG tablet Take 1 mg by mouth 2 (two) times daily.   Yes Historical Provider, MD    Physical Exam: Vitals:   08/23/16 2000 08/23/16 2030 08/23/16 2100 08/23/16 2130  BP: (!) 109/59 (!) 101/52 (!) 90/46 (!) 97/57  Pulse: 68 65 68 70  Resp: 15 (!) Temp:      TempSrc:      SpO2: 100% 99% 100% 100%    Constitutional: NAD, calm, comfortable Vitals:   08/23/16 2000 08/23/16 2030 08/23/16 2100 08/23/16 2130  BP: (!) 109/59 (!) 101/52 (!) 90/46 (!) 97/57  Pulse: 68 65 68 70  Resp: 15 (!) Temp:      TempSrc:      SpO2: 100% 99% 100% 100%   Eyes: PERRL, lids and conjunctivae normal, anicteric sclerae ENMT: Mucous membranes are moist. Left EAC clear without erythema, ear drum appears normal without fluid or erythema; Right EAC clear without erythema, ear drum does not appear erythematous or bulging.  Pain is bilateral.  She has no tragal pain or pain with auricle manipulation Neck: normal, supple Respiratory: CTAB, no wheezing, no crackles. Normal respiratory effort.  Cardiovascular: Regular rate and normal rhythm, no murmurs / rubs / gallops.  No change in heart sounds with positional changes. No extremity edema.  Pulses palpable in bilateral radial arteries and DP bilaterally.  Abdomen: no tenderness.  Bowel sounds positive.  Musculoskeletal: no clubbing / cyanosis. Normal muscle tone.  Skin: no rashes,  lesions, ulcers. Neurologic: Grossly intact, moving all extremities normally.  Psychiatric: Normal judgment and insight.  Normal mood.    Labs on Admission: I have personally reviewed following labs and imaging studies  CBC:  Recent Labs Lab 08/19/16 2138 08/23/16 1920  WBC 14.7* 11.6*  NEUTROABS 10.1* 7.3  HGB 12.6 12.7  HCT 38.0 37.9  MCV 94.1 93.1  PLT 264 281   Basic Metabolic Panel:  Recent Labs Lab 08/19/16 2138 08/23/16 1920  NA 138 135  K 3.7 3.9  CL 106 106  CO2 26 23  GLUCOSE 92 82  BUN 10 7  CREATININE 0.68 0.59  CALCIUM 8.4* 8.8*  MG  --  2.0   GFR: Estimated Creatinine Clearance: 96 mL/min (by C-G formula based on SCr of 0.59 mg/dL). Liver Function Tests:  Recent Labs Lab 08/19/16 2138 08/23/16 1920  AST 17 19  ALT 19 20  ALKPHOS 68 67  BILITOT 0.6 0.4  PROT 6.1* 6.4*  ALBUMIN 3.3* 3.4*   No results for input(s): LIPASE, AMYLASE in the last 168 hours. No results for input(s): AMMONIA in the last 168 hours. Coagulation Profile: No results for input(s): INR, PROTIME in the last 168 hours. Cardiac Enzymes: No results for input(s): CKTOTAL, CKMB, CKMBINDEX, TROPONINI in the last 168 hours. BNP (last 3 results) No results for input(s): PROBNP in the last 8760 hours. HbA1C: No results for input(s): HGBA1C in the last 72 hours. CBG: No results for input(s): GLUCAP in the last 168 hours. Lipid Profile: No results for input(s): CHOL, HDL, LDLCALC, TRIG, CHOLHDL, LDLDIRECT in the last 72 hours. Thyroid Function Tests: No results for input(s): TSH, T4TOTAL, FREET4, T3FREE, THYROIDAB in the last 72 hours. Anemia Panel: No results for input(s): VITAMINB12, FOLATE, FERRITIN, TIBC, IRON, RETICCTPCT in the last 72 hours. Urine analysis:    Component Value Date/Time   COLORURINE YELLOW 12/27/2015 1240   APPEARANCEUR TURBID (A) 12/27/2015 1240   LABSPEC 1.022 12/27/2015 1240   PHURINE 8.0 12/27/2015 1240   GLUCOSEU NEGATIVE 12/27/2015 1240    HGBUR NEGATIVE 12/27/2015 1240   BILIRUBINUR NEGATIVE 12/27/2015 1240   KETONESUR NEGATIVE 12/27/2015 1240   PROTEINUR NEGATIVE 12/27/2015 1240   UROBILINOGEN 0.2 04/01/2014 1531   NITRITE NEGATIVE 12/27/2015 1240   LEUKOCYTESUR NEGATIVE 12/27/2015 1240    Radiological Exams on Admission: Ct Head Wo Contrast  Result Date: 08/23/2016 CLINICAL DATA:  Syncope.  Loss of consciousness. EXAM: CT HEAD WITHOUT CONTRAST TECHNIQUE: Contiguous axial images were obtained from the base of the skull through the vertex without intravenous contrast. COMPARISON:  None. FINDINGS: Brain: No evidence of acute infarction, hemorrhage, hydrocephalus, extra-axial collection or mass lesion/mass effect. Vascular: No hyperdense vessel or unexpected calcification. Skull: Normal. Negative for fracture or focal lesion. Sinuses/Orbits: No acute finding. Other: None. IMPRESSION: Normal brain. Electronically Signed   By: Signa Kell M.D.   On: 08/23/2016 19:53    EKG: Independently reviewed. NSR, TW inversion in aVL, QTc 409  TTE from 2015 - essentially normal, EF WNL  Assessment/Plan Syncope and collapse - DDx includes many things in this young woman.  She has a h/o of SVT on propranolol. She could certainly be having ectopy or medication effect from the propranolol (HR has been in the 60s).  She was orthostatic by EMS, no clear signs of infection (only ear pain) and no recent diuretic or drug use.  She has had recent changes to her psychiatric medications with decrease in her xanax, though symptoms seem extreme for BZD withdrawal and not exactly consistent.  She has no murmur and TTE from 2015 did not show HCM or other abnormality.   - Telemetry monitoring for acute arrhythmia - I have ordered a new TTE, though I do not think it would be very revealing - Recheck orthostatic vital signs after fluids - IVF at 125cc/hr  Chest pain, atypical and typical features - Cycle troponin - AM EKG - Telemetry - She has  multiple allergies to pain medications as well as low blood pressure currently, so will avoid nitro unless absolutely needed.  Pain seems to resolve on it's own with rest.     Paroxysmal supraventricular tachycardia -  On propranolol, reordered for the morning as she took this today - If telemetry concerning for bradycardia overnight, would d/c medication    Depression and Anxiety - Continue Xanax in the evening and zoloft - Consider starting back very low dose PRN BZD in the AM if needed   DVT prophylaxis: Lovenox Code Status: Full  Disposition Plan: Telemetry, consider EP eval if anything is positive on tracings.  Consults called: None Admission status: Obs, Nolene Bernheim MD [facility] Pager [phone]  If 7PM-7AM, please contact night-coverage [email or web address] Password [email or web address]  08/23/2016, 10:01 PM

## 2016-08-24 DIAGNOSIS — F419 Anxiety disorder, unspecified: Secondary | ICD-10-CM | POA: Diagnosis not present

## 2016-08-24 DIAGNOSIS — R55 Syncope and collapse: Secondary | ICD-10-CM | POA: Diagnosis not present

## 2016-08-24 DIAGNOSIS — F329 Major depressive disorder, single episode, unspecified: Secondary | ICD-10-CM

## 2016-08-24 DIAGNOSIS — I471 Supraventricular tachycardia: Secondary | ICD-10-CM | POA: Diagnosis not present

## 2016-08-24 LAB — CBC
HEMATOCRIT: 35 % — AB (ref 36.0–46.0)
HEMOGLOBIN: 11.6 g/dL — AB (ref 12.0–15.0)
MCH: 31.1 pg (ref 26.0–34.0)
MCHC: 33.1 g/dL (ref 30.0–36.0)
MCV: 93.8 fL (ref 78.0–100.0)
Platelets: 229 10*3/uL (ref 150–400)
RBC: 3.73 MIL/uL — ABNORMAL LOW (ref 3.87–5.11)
RDW: 13.5 % (ref 11.5–15.5)
WBC: 8.2 10*3/uL (ref 4.0–10.5)

## 2016-08-24 LAB — BASIC METABOLIC PANEL
ANION GAP: 7 (ref 5–15)
BUN: 6 mg/dL (ref 6–20)
CO2: 22 mmol/L (ref 22–32)
Calcium: 7.9 mg/dL — ABNORMAL LOW (ref 8.9–10.3)
Chloride: 110 mmol/L (ref 101–111)
Creatinine, Ser: 0.62 mg/dL (ref 0.44–1.00)
GFR calc Af Amer: >60 mL/min (ref >60)
Glucose, Bld: 92 mg/dL (ref 65–99)
POTASSIUM: 3.4 mmol/L — AB (ref 3.5–5.1)
SODIUM: 139 mmol/L (ref 135–145)

## 2016-08-24 LAB — HIV ANTIBODY (ROUTINE TESTING W REFLEX): HIV Screen 4th Generation wRfx: NONREACTIVE

## 2016-08-24 LAB — TROPONIN I

## 2016-08-24 LAB — GLUCOSE, CAPILLARY
GLUCOSE-CAPILLARY: 99 mg/dL (ref 65–99)
Glucose-Capillary: 118 mg/dL — ABNORMAL HIGH (ref 65–99)

## 2016-08-24 MED ORDER — SODIUM CHLORIDE 0.9 % IV BOLUS (SEPSIS)
1000.0000 mL | Freq: Once | INTRAVENOUS | Status: AC
  Administered 2016-08-24: 1000 mL via INTRAVENOUS

## 2016-08-24 MED ORDER — POTASSIUM CHLORIDE CRYS ER 20 MEQ PO TBCR
60.0000 meq | EXTENDED_RELEASE_TABLET | Freq: Once | ORAL | Status: AC
  Administered 2016-08-24: 60 meq via ORAL
  Filled 2016-08-24: qty 3

## 2016-08-24 MED ORDER — HYDROCODONE-ACETAMINOPHEN 5-325 MG PO TABS
1.0000 | ORAL_TABLET | Freq: Once | ORAL | Status: AC
  Administered 2016-08-24: 1 via ORAL
  Filled 2016-08-24: qty 1

## 2016-08-24 NOTE — Discharge Summary (Signed)
Physician Discharge Summary  Diane Stein ZOX:[id number] DOB: 1985-10-26 DOA: 08/23/2016  PCP: No PCP Per Patient  Admit date: 08/23/2016 Discharge date: 08/24/2016  Admitted From: Home Disposition: Home  Recommendations for Outpatient Follow-up:  1. Follow up with PCP in 1-2 weeks 2. Please obtain BMP/CBC in one week.  Home Health: NA Equipment/Devices:NA  Discharge Condition: Stable CODE STATUS: Full Code Diet recommendation: Diet regular Room service appropriate? Yes; Fluid consistency: Thin Diet - low sodium heart healthy  Brief/Interim Summary: Diane Stein is a 31 y.o. female with medical history significant of SVT on propranolol, Depression/anxiety, childhood asthma who presents for chest pain and syncope.  She was seen in the ED on 08/19/16 for similar symptoms.  D dimer was elevated at the time, CT chest was negative for PE. Her pain improved with pain medication in the ED and she was sent home.  Since that time, she has had 3 more syncopal events and intermittent chest pain.  She notes that the syncopal events come without prodrome, she will be doing her normal activity and then wake up on the floor.  She has no vertigo, dizziness or lightheadedness.  She reports always having palpitations due to her SVT and this has not changed.  She has had SVT "as long as she can remember."  She previously saw Dr. Anne Fu while she was pregnant about 2 years ago and had a holter and TTE which she reports were reassuring. She took her propranolol yesterday and her pulse is in the 60s-80s in the ED.  Per EMS, she was noted to be orthostatic and BP has been lower in the ED.  Associated symptoms include ear pain which has been going on for about 5 days.  She notes no "new" drainage, no erythema.  The pain feels deep like an ear infection.  She denies other URI symptoms.   The chest pain she reports is like a tightness, vice-like in the center of her chest, radiates to jaw and back and with some  associated tingling in her left hand.  She denies nausea, diaphoresis.  She has recently had changes in her medications for anxiety with discontinuation of her daily xanax which she took PRN and increase of her prozac.  She continue to take long acting xanax  at night.  She has been on a BZD for about 2 years now for panic attacks.  She has had increased anxiety and panic attacks with these changes.   She is taking chantix for smoking cessation and now only smokes a few cigarettes per day.  Per chart review, she had an episode of sinusitis in January of this year, treated with Abx.  She has no FH of sudden cardiac death at a young age.  She had a PGF who passed from an MI at 30yo.  Discharge Diagnoses:  Active Problems:   Paroxysmal supraventricular tachycardia (HCC)   Syncope and collapse   Depression   Anxiety   Syncope and collapse - DDx includes many things in this young woman.  She has a h/o of SVT on propranolol. She could certainly be having ectopy or medication effect from the propranolol (HR has been in the 60s).  She was orthostatic by EMS, no recent diuretic or drug use. -She has no murmur and TTE from 2015 did not show HCM or other abnormality.   - Telemetry showed no acute arrhythmias. - Blood pressure was in the low side, she was orthostatic on admission - Aggressively hydrated with IV fluids, no dizziness  or syncope this morning, patient reported she had 4 episodes in the past 4 days. - Discussed with cardiology, Dr. Anne Fu, because her history of SVT and she mentioned palpitations recommended outpatient monitor. -Cardiology will arrange monitor to be placed as outpatient.  Chest pain, atypical and typical features -Troponin normal, 12-lead EKG without ischemic changes. -Her chest pain is not ACS, she has history of SVT and panic attacks.    Paroxysmal supraventricular tachycardia - On propranolol, reordered for the morning as she took this today - If telemetry concerning  for bradycardia overnight, would d/c medication    Depression and Anxiety -Patient mentioned on the day of discharge that her PCP discontinued Xanax, her symptoms not consistent with withdrawal. -She said Xanax discontinued on discharge and requested prescription but reported to the pharmacy as active medicine -She is on Prozac, continued.  Headache -Patient complained about headache, initially asked for fentanyl for headaches. -She reported that she has recurrent headaches, this continues consider referral to headache clinic.  Discharge Instructions  Discharge Instructions    Diet - low sodium heart healthy    Complete by:  As directed    Increase activity slowly    Complete by:  As directed      Allergies as of 08/24/2016      Reactions   Codeine Anaphylaxis   Oxycodone Anaphylaxis   Oxycodone-acetaminophen Hives, Shortness Of Breath, Palpitations   Other Hives   Allergic to pine nuts   Benadryl [diphenhydramine Hcl] Other (See Comments)   Restless leg       Medication List    TAKE these medications   ALAYCEN 1/35 tablet Generic drug:  norethindrone-ethinyl estradiol 1/35 Take 1 tablet by mouth daily.   ALPRAZolam 1 MG 24 hr tablet Commonly known as:  XANAX XR Take 1 mg by mouth at bedtime.   FLUoxetine 10 MG capsule Commonly known as:  PROZAC Take 10-20 mg by mouth See admin instructions. Filled 08/16/16: take 1 capsule (10 mg) by mouth daily for 1 week, then take 2 capsules (20 mg) daily   GOODY HEADACHE PO Take 1 packet by mouth daily as needed (pain/headache).   propranolol ER 80 MG 24 hr capsule Commonly known as:  INDERAL LA Take 80 mg by mouth daily.   varenicline 1 MG tablet Commonly known as:  CHANTIX Take 1 mg by mouth 2 (two) times daily.      Follow-up Information     [facility] Follow up.   Why:  Office will call you for monitor Contact information: [address] [place]  [place] [address] [phone]         Allergies  Allergen Reactions  . Codeine Anaphylaxis  . Oxycodone Anaphylaxis  . Oxycodone-Acetaminophen Hives, Shortness Of Breath and Palpitations  . Other Hives    Allergic to pine nuts  . Benadryl [Diphenhydramine Hcl] Other (See Comments)    Restless leg     Consultations:  None  Procedures (Echo, Carotid, EGD, Colonoscopy, ERCP)   Radiological studies: Dg Chest 2 View  Result Date: 08/19/2016 CLINICAL DATA:  Central chest pain.  Syncope. EXAM: CHEST  2 VIEW COMPARISON:  03/12/2016 FINDINGS: The cardiomediastinal contours are normal. The lungs are clear. Pulmonary vasculature is normal. No consolidation, pleural effusion, or pneumothorax. No acute osseous abnormalities are seen. IMPRESSION: No acute abnormality. Electronically Signed   By: Rubye Oaks M.D.   On: 08/19/2016 22:12   Ct Head Wo Contrast  Result Date: 08/23/2016 CLINICAL DATA:  Syncope.  Loss of consciousness. EXAM: CT HEAD WITHOUT CONTRAST TECHNIQUE: Contiguous axial images were obtained from the base of the skull through the vertex without intravenous contrast. COMPARISON:  None. FINDINGS: Brain: No evidence of acute infarction, hemorrhage, hydrocephalus, extra-axial collection or mass lesion/mass effect. Vascular: No hyperdense vessel or unexpected calcification. Skull: Normal. Negative for fracture or focal lesion. Sinuses/Orbits: No acute finding. Other: None. IMPRESSION: Normal brain. Electronically Signed   By: Signa Kell M.D.   On: 08/23/2016 19:53   Ct Angio Chest Pe W/cm &/or Wo Cm  Result Date: 08/20/2016 CLINICAL DATA:  Left-sided chest pain, onset tonight.  Syncope. EXAM: CT ANGIOGRAPHY CHEST WITH CONTRAST TECHNIQUE: Multidetector CT imaging of the chest was performed using the standard protocol during bolus administration of intravenous contrast. Multiplanar CT image reconstructions and MIPs were obtained to evaluate the vascular anatomy. CONTRAST:   100 cc Isovue 370 IV COMPARISON:  Chest radiographs 2 hours prior. FINDINGS: Cardiovascular: There are no filling defects within the pulmonary arteries to suggest pulmonary embolus. Thoracic aorta is normal in caliber. No aneurysm or evidence of dissection. Heart is normal in size. Mediastinum/Nodes: No mediastinal or hilar adenopathy. No pericardial effusion. The esophagus is decompressed. Visualized thyroid gland is unremarkable. Lungs/Pleura: The lungs are clear. No consolidation, pleural fluid or pulmonary edema. No pulmonary mass or suspicious nodule. Upper Abdomen: No acute abnormality. Musculoskeletal: No chest wall abnormality. No acute or significant osseous findings. Review of the MIP images confirms the above findings. IMPRESSION: No pulmonary embolus or acute abnormality. Unremarkable CTA of the chest. Electronically Signed   By: Rubye Oaks M.D.   On: 08/20/2016 00:30     Subjective:  Discharge Exam: Vitals:   08/23/16 2345 08/24/16 0515 08/24/16 0717 08/24/16 0953  BP: (!) 108/56  (!) 98/52 (!) 102/52  Pulse: 66 68  71  Resp:  18    Temp: 98.3 F (36.8 C) 98 F (36.7 C)    TempSrc: Oral Oral    SpO2: 99% 98%    Weight:  73.1 kg (161 lb 3.2 oz)    Height:       General: Pt is alert, awake, not in acute distress Cardiovascular: RRR, S1/S2 +, no rubs, no gallops Respiratory: CTA bilaterally, no wheezing, no rhonchi Abdominal: Soft, NT, ND, bowel sounds + Extremities: no edema, no cyanosis   The results of significant diagnostics from this hospitalization (including imaging, microbiology, ancillary and laboratory) are listed below for reference.    Microbiology: No results found for this or any previous visit (from the past 240 hour(s)).   Labs: BNP (last 3 results) No results for input(s): BNP in the last 8760 hours. Basic Metabolic Panel:  Recent Labs Lab 08/19/16 2138 08/23/16 1920 08/24/16 0531  NA 138 135 139  K 3.7 3.9 3.4*  CL 106 106 110  CO2 GLUCOSE 92 82 92  BUN CREATININE 0.68 0.59 0.62  CALCIUM 8.4* 8.8* 7.9*  MG  --  2.0  --    Liver Function Tests:  Recent Labs Lab 08/19/16 2138 08/23/16 1920  AST 17 19  ALT 19 20  ALKPHOS 68 67  BILITOT 0.6 0.4  PROT 6.1* 6.4*  ALBUMIN 3.3* 3.4*   No results for input(s): LIPASE, AMYLASE in the last 168 hours. No results for input(s): AMMONIA in the last 168 hours. CBC:  Recent Labs Lab 08/19/16 2138 08/23/16 1920 08/24/16 0531  WBC 14.7* 11.6* 8.2  NEUTROABS 10.1* 7.3  --  HGB 12.6 12.7 11.6*  HCT 38.0 37.9 35.0*  MCV 94.1 93.1 93.8  PLT 264 281 229   Cardiac Enzymes:  Recent Labs Lab 08/23/16 2304 08/24/16 0531  TROPONINI <0.03 <0.03   BNP: Invalid input(s): POCBNP CBG:  Recent Labs Lab 08/24/16 0618 08/24/16 0810  GLUCAP 118* 99   D-Dimer No results for input(s): DDIMER in the last 72 hours. Hgb A1c No results for input(s): HGBA1C in the last 72 hours. Lipid Profile No results for input(s): CHOL, HDL, LDLCALC, TRIG, CHOLHDL, LDLDIRECT in the last 72 hours. Thyroid function studies No results for input(s): TSH, T4TOTAL, T3FREE, THYROIDAB in the last 72 hours.  Invalid input(s): FREET3 Anemia work up No results for input(s): VITAMINB12, FOLATE, FERRITIN, TIBC, IRON, RETICCTPCT in the last 72 hours. Urinalysis Sepsis Labs Invalid input(s): PROCALCITONIN,  WBC,  LACTICIDVEN Microbiology No results found for this or any previous visit (from the past 240 hour(s)).   Time coordinating discharge: Over 30 minutes  SIGNED:   Clint Lipps, MD  [facility] 08/24/2016, 10:49 AM Pager   If 7PM-7AM, please contact night-coverage [email or web address] Password [email or web address]

## 2016-08-24 NOTE — Progress Notes (Signed)
Pt discharged home with family. IV and telemetry box removed. Pt received discharge instructions and all questions were answered. Pt left with all of her belongings. Pt left the unit via wheelchair and was accompanied by this RN.    Berdine Dance [facility]

## 2016-08-25 ENCOUNTER — Emergency Department (HOSPITAL_COMMUNITY)
Admission: EM | Admit: 2016-08-25 | Discharge: 2016-08-25 | Disposition: A | Discharge status: Home / Self Care | Payer: Medicaid Other | Attending: Emergency Medicine | Admitting: Emergency Medicine

## 2016-08-25 ENCOUNTER — Encounter (HOSPITAL_COMMUNITY): Payer: Self-pay

## 2016-08-25 ENCOUNTER — Other Ambulatory Visit: Payer: Self-pay | Admitting: Physician Assistant

## 2016-08-25 ENCOUNTER — Emergency Department (HOSPITAL_COMMUNITY): Payer: Medicaid Other

## 2016-08-25 ENCOUNTER — Telehealth: Payer: Self-pay | Admitting: *Deleted

## 2016-08-25 DIAGNOSIS — Y9389 Activity, other specified: Secondary | ICD-10-CM | POA: Insufficient documentation

## 2016-08-25 DIAGNOSIS — W1839XA Other fall on same level, initial encounter: Secondary | ICD-10-CM | POA: Insufficient documentation

## 2016-08-25 DIAGNOSIS — S0990XA Unspecified injury of head, initial encounter: Secondary | ICD-10-CM | POA: Diagnosis not present

## 2016-08-25 DIAGNOSIS — Y999 Unspecified external cause status: Secondary | ICD-10-CM | POA: Diagnosis not present

## 2016-08-25 DIAGNOSIS — J45909 Unspecified asthma, uncomplicated: Secondary | ICD-10-CM | POA: Insufficient documentation

## 2016-08-25 DIAGNOSIS — F17210 Nicotine dependence, cigarettes, uncomplicated: Secondary | ICD-10-CM | POA: Diagnosis not present

## 2016-08-25 DIAGNOSIS — S60221A Contusion of right hand, initial encounter: Secondary | ICD-10-CM | POA: Insufficient documentation

## 2016-08-25 DIAGNOSIS — Y92009 Unspecified place in unspecified non-institutional (private) residence as the place of occurrence of the external cause: Secondary | ICD-10-CM | POA: Insufficient documentation

## 2016-08-25 DIAGNOSIS — R55 Syncope and collapse: Secondary | ICD-10-CM

## 2016-08-25 DIAGNOSIS — Z7982 Long term (current) use of aspirin: Secondary | ICD-10-CM | POA: Insufficient documentation

## 2016-08-25 DIAGNOSIS — M7918 Myalgia, other site: Secondary | ICD-10-CM

## 2016-08-25 MED ORDER — HYDROCODONE-ACETAMINOPHEN 5-325 MG PO TABS
1.0000 | ORAL_TABLET | Freq: Once | ORAL | Status: AC
  Administered 2016-08-25: 1 via ORAL
  Filled 2016-08-25: qty 1

## 2016-08-25 MED ORDER — CYCLOBENZAPRINE HCL 10 MG PO TABS: 10.0000 mg | ORAL_TABLET | Freq: Two times a day (BID) | ORAL | 0 refills | Status: DC | PRN
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
## 2016-08-25 NOTE — Telephone Encounter (Signed)
Financial [employer] Application mailed,patient is aware to bring all Information to the office.

## 2016-08-25 NOTE — Discharge Instructions (Signed)
Please read and follow all provided instructions.  Your diagnoses today include:  1. Syncope, unspecified syncope type   2. Minor head injury, initial encounter   3. Musculoskeletal pain   4. Contusion of right hand, initial encounter     Tests performed today include:  Vital signs. See below for your results today.   Medications prescribed:    Flexeril (cyclobenzaprine) - muscle relaxer medication  DO NOT drive or perform any activities that require you to be awake and alert because this medicine can make you drowsy.   Take any prescribed medications only as directed.  Home care instructions:  Follow any educational materials contained in this packet. Your symptoms should resolve steadily over several days at this time. Use warmth on affected areas as needed.   Follow-up instructions: Please follow-up with your primary care provider in 1 week for further evaluation of your symptoms if they are not completely improved.   Return instructions:   Please return to the Emergency Department if you experience worsening symptoms.   Please return if you experience increasing pain, vomiting, vision or hearing changes, confusion, numbness or tingling in your arms or legs, or if you feel it is necessary for any reason.   Return if you have different or changing episodes of syncope with different associated symptoms or injury from falling.   Please return if you have any other emergent concerns.  Additional Information:  Your vital signs today were: BP 120/71    Pulse 77    Temp 98.5 F (36.9 C) (Oral)    Resp 16    LMP 07/28/2016 Comment: pregnancy test waiver signed by pt   SpO2 98%  If your blood pressure (BP) was elevated above 135/85 this visit, please have this repeated by your doctor within one month. --------------

## 2016-08-25 NOTE — ED Notes (Signed)
Patient transported to X-ray 
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
## 2016-10-05 ENCOUNTER — Encounter (HOSPITAL_COMMUNITY): Payer: Self-pay | Admitting: Emergency Medicine

## 2016-10-05 ENCOUNTER — Emergency Department (HOSPITAL_COMMUNITY)
Admission: EM | Admit: 2016-10-05 | Discharge: 2016-10-05 | Disposition: A | Discharge status: Home / Self Care | Payer: Medicaid Other | Attending: Emergency Medicine | Admitting: Emergency Medicine

## 2016-10-05 DIAGNOSIS — Z79899 Other long term (current) drug therapy: Secondary | ICD-10-CM | POA: Diagnosis not present

## 2016-10-05 DIAGNOSIS — T7840XA Allergy, unspecified, initial encounter: Secondary | ICD-10-CM | POA: Insufficient documentation

## 2016-10-05 DIAGNOSIS — J45909 Unspecified asthma, uncomplicated: Secondary | ICD-10-CM | POA: Insufficient documentation

## 2016-10-05 DIAGNOSIS — F17210 Nicotine dependence, cigarettes, uncomplicated: Secondary | ICD-10-CM | POA: Diagnosis not present

## 2016-10-05 DIAGNOSIS — Z7982 Long term (current) use of aspirin: Secondary | ICD-10-CM | POA: Diagnosis not present

## 2016-10-05 MED ORDER — HYDROXYZINE HCL 25 MG PO TABS
25.0000 mg | ORAL_TABLET | Freq: Once | ORAL | Status: AC
  Administered 2016-10-05: 25 mg via ORAL
  Filled 2016-10-05: qty 1

## 2016-10-05 MED ORDER — PREDNISONE 20 MG PO TABS: 40.0000 mg | ORAL_TABLET | Freq: Every day | ORAL | 0 refills | Status: DC

## 2016-10-05 MED ORDER — HYDROXYZINE HCL 25 MG PO TABS: 25.0000 mg | ORAL_TABLET | Freq: Four times a day (QID) | ORAL | 0 refills | Status: DC

## 2016-10-05 MED ORDER — FAMOTIDINE IN NACL 20-0.9 MG/50ML-% IV SOLN
20.0000 mg | Freq: Once | INTRAVENOUS | Status: AC
  Administered 2016-10-05: 20 mg via INTRAVENOUS
  Filled 2016-10-05: qty 50

## 2016-10-05 MED ORDER — METHYLPREDNISOLONE SODIUM SUCC 125 MG IJ SOLR
125.0000 mg | Freq: Once | INTRAMUSCULAR | Status: AC
  Administered 2016-10-05: 125 mg via INTRAVENOUS
  Filled 2016-10-05: qty 2

## 2016-10-05 NOTE — Discharge Instructions (Signed)
Vistaril as needed for itching.  Prednisone daily starting tomorrow morning.   Follow up with your doctor if symptoms are not improving after 3 days. You may return to the emergency department if symptoms worsen, become progressive, or become more concerning.   TREATMENT AND HOME CARE INSTRUCTIONS  If hives or rash are present:  Use your Vistaril for hives and itching as needed. Do not drive or drink alcohol until medications used to treat the reaction have worn off. Antihistamines tend to make people sleepy.  Apply cold cloths (compresses) to the skin or take baths in cool water. This will help itching. Avoid hot baths or showers. Heat will make a rash and itching worse.  See Your Primary Care Doctor if:  Your allergies are becoming progressively more troublesome.  You suspect a food allergy. Symptoms generally happen within 30 minutes of eating a food.  Your symptoms have not gone away within 2 days.  SEEK IMMEDIATE MEDICAL CARE IF:  You develop difficulty breathing or wheezing, or have a tight feeling in your chest or throat (feeling like your throat is closing) You develop swollen lips or tongue You develop hives on your chest, neck or face. You are unable to swallow fluids or salvia secretions.   A severe reaction with any of the above problems should be considered life-threatening. If you suddenly develop difficulty breathing call for local emergency medical help. THIS IS AN EMERGENCY.

## 2016-10-05 NOTE — ED Provider Notes (Signed)
[facility]-EMERGENCY DEPT Provider Note   CSN: [id number] Arrival date & time: 10/05/16  1701     History   Chief Complaint Chief Complaint  Patient presents with  . Allergic Reaction  . Pruritis  . Shortness of Breath    HPI Diane Stein is a 31 y.o. female.  The history is provided by the patient and medical records. No language interpreter was used.  Allergic Reaction  Presenting symptoms: rash   Presenting symptoms: no wheezing   Shortness of Breath  Associated symptoms include rash. Pertinent negatives include no cough and no wheezing.   Diane Cavalierlizabeth Diane Stein is a 31 y.o. female  who presents to the Emergency Department complaining of acute onset of generalized pruritic rash approximately 30 minutes ago. She was eating sushi with avocado and approximately 20 minutes later developed rash. No hx of similar sxs. No known food or environmental allergies. No new lotions, detergents, etc. No other new foods. Initially felt like she was short of breath. She took her home xanax before she came in and shortness of breath resolved. No cough or wheezing. No other medications given prior to arrival for symptoms.    Past Medical History:  Diagnosis Date  . Anxiety   . Asthma   . Bronchitis   . Depression   . Tachycardia 2016    Patient Active Problem List   Diagnosis Date Noted  . Syncope and collapse 08/23/2016  . Depression   . Anxiety   . SVD (spontaneous vaginal delivery) 11/08/2014  . Irregular uterine contractions 11/07/2014  . Palpitations 05/12/2014  . Paroxysmal supraventricular tachycardia (HCC) 05/12/2014  . Pregnancy 05/12/2014    Past Surgical History:  Procedure Laterality Date  . TOOTH EXTRACTION Right 2008    OB History    Gravida Para Term Preterm AB Living   5 1 1   3 1    SAB TAB Ectopic Multiple Live Births   3     0 1       Home Medications    Prior to Admission medications   Medication Sig Start Date End Date Taking? Authorizing Provider    ALPRAZolam (XANAX XR) 1 MG 24 hr tablet Take 1 mg by mouth at bedtime. 08/16/16   [provider]  Aspirin-Acetaminophen-Caffeine (GOODY HEADACHE PO) Take 1 packet by mouth daily as needed (pain/headache).     [provider]  cyclobenzaprine (FLEXERIL) 10 MG tablet Take 1 tablet (10 mg total) by mouth 2 (two) times daily as needed for muscle spasms. 08/25/16   Renne CriglerGeiple, Joshua, PA-C  desvenlafaxine (PRISTIQ) 50 MG 24 hr tablet Take 50 mg by mouth See admin instructions. Tapering off - start date 08/16/16: take 100 mg pristiq daily for 4 days, then take 1 tablet (50 mg) daily  for 5 days, then take 1 tablet (50 mg) every other day for 4 days, then stop.    [provider]  FLUoxetine (PROZAC) 10 MG capsule Take 10-20 mg by mouth See admin instructions. Start date 08/16/16: take 1 tablet (10 mg) by mouth daily for 9 days, then take 2 tablets (20 mg) daily 08/16/16   [provider]  hydrOXYzine (ATARAX/VISTARIL) 25 MG tablet Take 1 tablet (25 mg total) by mouth every 6 (six) hours. 10/05/16   Manisha Cancel, Chase PicketJaime Pilcher, PA-C  norethindrone-ethinyl estradiol 1/35 (ALAYCEN 1/35) tablet Take 1 tablet by mouth daily.    [provider]  predniSONE (DELTASONE) 20 MG tablet Take 2 tablets (40 mg total) by mouth daily. 10/05/16   Inioluwa Boulay,  Chase Picket, PA-C  propranolol ER (INDERAL LA) 80 MG 24 hr capsule Take 80 mg by mouth daily.    [provider]  varenicline (CHANTIX) 1 MG tablet Take 1 mg by mouth 2 (two) times daily.    [provider]    Family History Family History  Problem Relation Age of Onset  . Hypertension Mother   . Heart disease Father   . Heart disease Maternal Grandmother   . Hypertension Maternal Grandmother   . Heart disease Maternal Grandfather   . Hypertension Maternal Grandfather   . Heart disease Paternal Grandmother   . Hypertension Paternal Grandmother   . Heart disease Paternal Grandfather   . Hypertension Paternal Grandfather      Social History Social History  Substance Use Topics  . Smoking status: Current Every Day Smoker    Packs/day: 0.50    Types: Cigarettes  . Smokeless tobacco: Never Used  . Alcohol use No     Allergies   Codeine; Oxycodone; Oxycodone-acetaminophen; Other; and Benadryl [diphenhydramine hcl]   Review of Systems Review of Systems  Respiratory: Positive for shortness of breath (Resolved). Negative for cough, wheezing and stridor.   Skin: Positive for rash.  All other systems reviewed and are negative.    Physical Exam Updated Vital Signs BP 114/70   Pulse 83   Temp 98.3 F (36.8 C) (Oral)   Resp 20   Ht 5\' 3"  (1.6 m)   Wt 72.6 kg   LMP 09/28/2016   SpO2 92%   Breastfeeding? No   BMI 28.34 kg/m   Physical Exam  Constitutional: She is oriented to person, place, and time. She appears well-developed and well-nourished. No distress.  HENT:  Head: Normocephalic and atraumatic.  Airway patent. No angioedema / oral swelling.   Neck:  Non-tender, supple.   Cardiovascular: Normal rate, regular rhythm and normal heart sounds.   No murmur heard. Pulmonary/Chest: Effort normal and breath sounds normal. No respiratory distress.  Lungs clear to auscultation bilaterally with no wheezes, rales or rhonchi.   Abdominal: Soft. She exhibits no distension. There is no tenderness.  Musculoskeletal: She exhibits no edema.  Neurological: She is alert and oriented to person, place, and time.  Skin: Skin is warm and dry.  Small scattered areas of erythema to upper and lower extremities. No lesions to palms or soles. No open wounds, blisters or areas of fluctuance.  Nursing note and vitals reviewed.    ED Treatments / Results  Labs (all labs ordered are listed, but only abnormal results are displayed) Labs Reviewed - No data to display  EKG  EKG Interpretation None       Radiology No results found.  Procedures Procedures (including critical care time)  Medications  Ordered in ED Medications  methylPREDNISolone sodium succinate (SOLU-MEDROL) 125 mg/2 mL injection 125 mg (125 mg Intravenous Given 10/05/16 1740)  famotidine (PEPCID) IVPB 20 mg premix (0 mg Intravenous Stopped 10/05/16 1810)  hydrOXYzine (ATARAX/VISTARIL) tablet 25 mg (25 mg Oral Given 10/05/16 1830)     Initial Impression / Assessment and Plan / ED Course  I have reviewed the triage vital signs and the nursing notes.  Pertinent labs & imaging results that were available during my care of the patient were reviewed by me and considered in my medical decision making (see chart for details).    Jurline Folger is a 31 y.o. female who presents to ED for acute onset of diffuse pruritic rash described as hives shortly after eating avocado for  the first time. No other new detergents, lotions, etc. No other new foods recently. No evidence of oral swelling or airway compromise. Lungs are clear bilaterally and vitals stable. Initially felt short of breath. No cough or wheezing. She took her home xanax and SOB resolved. Has not felt SOB while in ED today. No GI or neurologic symptoms to suggest anaphylaxis. Given vistaril (states she allergic to benadryl) pepcid, and steroids in ED. On re-evaluation, symptoms much improved and patient feels comfortable with discharge to home. Evaluation does not show pathology that would require ongoing emergent intervention or inpatient treatment. Rx for steroid burst and vistaril given. Home care instructions and return precautions discussed. PCP follow up encouraged if symptoms persist. All questions answered.  Patient discussed with Dr. Estell Harpin who agrees with treatment plan.   Final Clinical Impressions(s) / ED Diagnoses   Final diagnoses:  Allergic reaction, initial encounter    New Prescriptions Discharge Medication List as of 10/05/2016  7:18 PM    START taking these medications   Details  hydrOXYzine (ATARAX/VISTARIL) 25 MG tablet Take 1 tablet (25 mg  total) by mouth every 6 (six) hours., Starting Sun 10/05/2016, Print    predniSONE (DELTASONE) 20 MG tablet Take 2 tablets (40 mg total) by mouth daily., Starting Sun 10/05/2016, Print         Robel Wuertz, Chase Picket, PA-C 10/05/16 2118    Bethann Berkshire, MD 10/06/16 [phone]

## 2016-10-05 NOTE — ED Triage Notes (Signed)
Pt reports ate sushi with avocado about 30 mins PTA. Pt has never had avocado before and thinks she maybe having an allergic reaction from it. Pt c/o generalized itching and some shortness of breath. Pt took xanax PTA.

## 2016-10-14 ENCOUNTER — Emergency Department (HOSPITAL_BASED_OUTPATIENT_CLINIC_OR_DEPARTMENT_OTHER)
Admission: EM | Admit: 2016-10-14 | Discharge: 2016-10-14 | Disposition: A | Discharge status: Home / Self Care | Payer: Medicaid Other | Attending: Emergency Medicine | Admitting: Emergency Medicine

## 2016-10-14 ENCOUNTER — Encounter (HOSPITAL_BASED_OUTPATIENT_CLINIC_OR_DEPARTMENT_OTHER): Payer: Self-pay | Admitting: Emergency Medicine

## 2016-10-14 ENCOUNTER — Emergency Department (HOSPITAL_BASED_OUTPATIENT_CLINIC_OR_DEPARTMENT_OTHER): Payer: Medicaid Other

## 2016-10-14 DIAGNOSIS — Z79899 Other long term (current) drug therapy: Secondary | ICD-10-CM | POA: Diagnosis not present

## 2016-10-14 DIAGNOSIS — J45909 Unspecified asthma, uncomplicated: Secondary | ICD-10-CM | POA: Insufficient documentation

## 2016-10-14 DIAGNOSIS — N12 Tubulo-interstitial nephritis, not specified as acute or chronic: Secondary | ICD-10-CM | POA: Diagnosis not present

## 2016-10-14 DIAGNOSIS — F17210 Nicotine dependence, cigarettes, uncomplicated: Secondary | ICD-10-CM | POA: Insufficient documentation

## 2016-10-14 DIAGNOSIS — R109 Unspecified abdominal pain: Secondary | ICD-10-CM | POA: Diagnosis present

## 2016-10-14 LAB — COMPREHENSIVE METABOLIC PANEL
ALT: 18 U/L (ref 14–54)
ANION GAP: 8 (ref 5–15)
AST: 20 U/L (ref 15–41)
Albumin: 3.8 g/dL (ref 3.5–5.0)
Alkaline Phosphatase: 79 U/L (ref 38–126)
BILIRUBIN TOTAL: 0.3 mg/dL (ref 0.3–1.2)
BUN: 9 mg/dL (ref 6–20)
CO2: 24 mmol/L (ref 22–32)
Calcium: 8.5 mg/dL — ABNORMAL LOW (ref 8.9–10.3)
Chloride: 105 mmol/L (ref 101–111)
Creatinine, Ser: 0.56 mg/dL (ref 0.44–1.00)
GFR calc Af Amer: >60 mL/min (ref >60)
Glucose, Bld: 101 mg/dL — ABNORMAL HIGH (ref 65–99)
POTASSIUM: 3.6 mmol/L (ref 3.5–5.1)
Sodium: 137 mmol/L (ref 135–145)
TOTAL PROTEIN: 7 g/dL (ref 6.5–8.1)

## 2016-10-14 LAB — URINALYSIS, MICROSCOPIC (REFLEX): RBC / HPF: NONE SEEN RBC/hpf (ref 0–5)

## 2016-10-14 LAB — URINALYSIS, ROUTINE W REFLEX MICROSCOPIC
Bilirubin Urine: NEGATIVE
Glucose, UA: NEGATIVE mg/dL
HGB URINE DIPSTICK: NEGATIVE
Ketones, ur: NEGATIVE mg/dL
Nitrite: NEGATIVE
PROTEIN: NEGATIVE mg/dL
SPECIFIC GRAVITY, URINE: 1.023 (ref 1.005–1.030)
pH: 7 (ref 5.0–8.0)

## 2016-10-14 LAB — CBC WITH DIFFERENTIAL/PLATELET
Basophils Absolute: 0 10*3/uL (ref 0.0–0.1)
Basophils Relative: 0 %
Eosinophils Absolute: 0.1 10*3/uL (ref 0.0–0.7)
Eosinophils Relative: 1 %
HEMATOCRIT: 40.7 % (ref 36.0–46.0)
Hemoglobin: 13.5 g/dL (ref 12.0–15.0)
LYMPHS PCT: 29 %
Lymphs Abs: 3.4 10*3/uL (ref 0.7–4.0)
MCH: 31.4 pg (ref 26.0–34.0)
MCHC: 33.2 g/dL (ref 30.0–36.0)
MCV: 94.7 fL (ref 78.0–100.0)
MONO ABS: 0.9 10*3/uL (ref 0.1–1.0)
MONOS PCT: 8 %
NEUTROS ABS: 7.5 10*3/uL (ref 1.7–7.7)
Neutrophils Relative %: 62 %
Platelets: 300 10*3/uL (ref 150–400)
RBC: 4.3 MIL/uL (ref 3.87–5.11)
RDW: 14.3 % (ref 11.5–15.5)
WBC: 12 10*3/uL — ABNORMAL HIGH (ref 4.0–10.5)

## 2016-10-14 LAB — PREGNANCY, URINE: Preg Test, Ur: NEGATIVE

## 2016-10-14 MED ORDER — CEPHALEXIN 500 MG PO CAPS: 500.0000 mg | ORAL_CAPSULE | Freq: Three times a day (TID) | ORAL | 0 refills | Status: AC

## 2016-10-14 MED ORDER — HYDROCODONE-ACETAMINOPHEN 5-325 MG PO TABS: 1.0000 | ORAL_TABLET | Freq: Four times a day (QID) | ORAL | 0 refills | Status: DC | PRN

## 2016-10-14 MED ORDER — ONDANSETRON HCL 4 MG/2ML IJ SOLN
4.0000 mg | Freq: Once | INTRAMUSCULAR | Status: AC
  Administered 2016-10-14: 4 mg via INTRAVENOUS
  Filled 2016-10-14: qty 2

## 2016-10-14 MED ORDER — KETOROLAC TROMETHAMINE 30 MG/ML IJ SOLN
30.0000 mg | Freq: Once | INTRAMUSCULAR | Status: AC
  Administered 2016-10-14: 30 mg via INTRAVENOUS
  Filled 2016-10-14: qty 1

## 2016-10-14 MED ORDER — SODIUM CHLORIDE 0.9 % IV BOLUS (SEPSIS)
1000.0000 mL | Freq: Once | INTRAVENOUS | Status: AC
  Administered 2016-10-14: 1000 mL via INTRAVENOUS

## 2016-10-14 MED ORDER — DEXTROSE 5 % IV SOLN
1.0000 g | Freq: Once | INTRAVENOUS | Status: AC
  Administered 2016-10-14: 1 g via INTRAVENOUS
  Filled 2016-10-14: qty 10

## 2016-10-14 MED ORDER — MORPHINE SULFATE (PF) 4 MG/ML IV SOLN
4.0000 mg | Freq: Once | INTRAVENOUS | Status: AC
  Administered 2016-10-14: 4 mg via INTRAVENOUS
  Filled 2016-10-14: qty 1

## 2016-10-14 MED ORDER — HYDROMORPHONE HCL 1 MG/ML IJ SOLN
1.0000 mg | Freq: Once | INTRAMUSCULAR | Status: AC
  Administered 2016-10-14: 1 mg via INTRAVENOUS
  Filled 2016-10-14: qty 1

## 2016-10-14 NOTE — ED Provider Notes (Signed)
[facility]   CSN: [id number] Arrival date & time: 10/14/16  1714     History   Chief Complaint Chief Complaint  Patient presents with  . Flank Pain    HPI Diane Stein is a 31 y.o. female history of depression, recurrent syncope, anxiety here presenting with left flank pain. Patient states that Diane Stein has sharp, left flank pain that is constant for the last 2 days. Denies any radiation to the pain. Denies any hematuria or dysuria. Denies any vomiting but is nauseated. Denies any fevers and chills. Patient was [facility] several days [facility] and was prescribed some Vistaril. Diane Stein states that Diane Stein does have a history of kidney stone when Diane Stein was 20. Denies any previous urologic procedures.   The history is provided by the patient.    Past Medical History:  Diagnosis Date  . Anxiety   . Asthma   . Bronchitis   . Depression   . Tachycardia 2016    Patient Active Problem List   Diagnosis Date Noted  . Syncope and collapse 08/23/2016  . Depression   . Anxiety   . SVD (spontaneous vaginal delivery) 11/08/2014  . Irregular uterine contractions 11/07/2014  . Palpitations 05/12/2014  . Paroxysmal supraventricular tachycardia (HCC) 05/12/2014  . Pregnancy 05/12/2014    Past Surgical History:  Procedure Laterality Date  . TOOTH EXTRACTION Right 2008    OB History    Gravida Para Term Preterm AB Living   5 1 1   3 1    SAB TAB Ectopic Multiple Live Births   3     0 1       Home Medications    Prior to Admission medications   Medication Sig Start Date End Date Taking? Authorizing Provider  ALPRAZolam (XANAX XR) 1 MG 24 hr tablet Take 1 mg by mouth at bedtime. 08/16/16  Yes [provider]  FLUoxetine (PROZAC) 10 MG capsule Take 10-20 mg by mouth See admin instructions. Start date 08/16/16: take 1 tablet (10 mg) by mouth daily for 9 days, then take 2 tablets (20 mg) daily 08/16/16  Yes [provider]    norethindrone-ethinyl estradiol 1/35 (ALAYCEN 1/35) tablet Take 1 tablet by mouth daily.   Yes [provider]  Aspirin-Acetaminophen-Caffeine (GOODY HEADACHE PO) Take 1 packet by mouth daily as needed (pain/headache).     [provider]  cyclobenzaprine (FLEXERIL) 10 MG tablet Take 1 tablet (10 mg total) by mouth 2 (two) times daily as needed for muscle spasms. 08/25/16   Renne CriglerGeiple, Joshua, PA-C  desvenlafaxine (PRISTIQ) 50 MG 24 hr tablet Take 50 mg by mouth See admin instructions. Tapering off - start date 08/16/16: take 100 mg pristiq daily for 4 days, then take 1 tablet (50 mg) daily  for 5 days, then take 1 tablet (50 mg) every other day for 4 days, then stop.    [provider]  hydrOXYzine (ATARAX/VISTARIL) 25 MG tablet Take 1 tablet (25 mg total) by mouth every 6 (six) hours. 10/05/16   Ward, Chase PicketJaime Pilcher, PA-C  predniSONE (DELTASONE) 20 MG tablet Take 2 tablets (40 mg total) by mouth daily. 10/05/16   Ward, Chase PicketJaime Pilcher, PA-C  propranolol ER (INDERAL LA) 80 MG 24 hr capsule Take 80 mg by mouth daily.    [provider]  varenicline (CHANTIX) 1 MG tablet Take 1 mg by mouth 2 (two) times daily.    [provider]    Family History Family History  Problem Relation Age of Onset  . Hypertension Mother   . Heart disease Father   . Heart disease Maternal Grandmother   . Hypertension Maternal Grandmother   . Heart disease Maternal Grandfather   . Hypertension Maternal Grandfather   . Heart disease Paternal Grandmother   . Hypertension Paternal Grandmother   . Heart disease Paternal Grandfather   . Hypertension Paternal Grandfather     Social History Social History  Substance Use Topics  . Smoking status: Current Every Day Smoker    Packs/day: 0.50    Types: Cigarettes  . Smokeless tobacco: Never Used  . Alcohol use No     Allergies   Codeine; Oxycodone; Oxycodone-acetaminophen; Other; and Benadryl [diphenhydramine hcl]   Review of  Systems Review of Systems  Genitourinary: Positive for flank pain.  All other systems reviewed and are negative.    Physical Exam Updated Vital Signs BP 126/82 (BP Location: Left Arm)   Pulse 90   Temp 98.3 F (36.8 C) (Oral)   Resp 18   Ht 5\' 3"  (1.6 m)   Wt 72.6 kg (160 lb)   LMP 09/28/2016   SpO2 98%   BMI 28.34 kg/m   Physical Exam  Constitutional: Diane Stein is oriented to person, place, and time.  Uncomfortable, holding Diane Stein   HENT:  Head: Normocephalic.  Mouth/Throat: Oropharynx is clear and moist.  Eyes: EOM are normal. Pupils are equal, round, and reactive to light.  Neck: Normal range of motion. Neck supple.  Cardiovascular: Normal rate, regular rhythm and normal heart sounds.   Pulmonary/Chest: Effort normal and breath sounds normal. No respiratory distress. Diane Stein has no wheezes.  Abdominal: Soft. Bowel sounds are normal.  + Diane CVAT   Musculoskeletal: Normal range of motion.  Neurological: Diane Stein is alert and oriented to person, place, and time.  Skin: Skin is warm.  Psychiatric: Diane Stein has a normal mood and affect.  Nursing note and vitals reviewed.    ED Treatments / Results  Labs (all labs ordered are listed, but only abnormal results are displayed) Labs Reviewed  URINALYSIS, ROUTINE W REFLEX MICROSCOPIC - Abnormal; Notable for the following:       Result Value   APPearance CLOUDY (*)    Leukocytes, UA SMALL (*)    All other components within normal limits  CBC WITH DIFFERENTIAL/PLATELET - Abnormal; Notable for the following:    WBC 12.0 (*)    All other components within normal limits  COMPREHENSIVE METABOLIC PANEL - Abnormal; Notable for the following:    Glucose, Bld 101 (*)    Calcium 8.5 (*)    All other components within normal limits  URINALYSIS, MICROSCOPIC (REFLEX) - Abnormal; Notable for the following:    Bacteria, UA MANY (*)    Squamous Epithelial / LPF 6-30 (*)    All other components within normal limits  PREGNANCY, URINE    EKG  EKG  Interpretation None       Radiology US Renal  Result Date: 10/14/2016 CLINICAL DATA:  Left flank pain for 2 days EXAM: RENAL / URINARY TRACT ULTRASOUND COMPLETE COMPARISON:  CT 12/27/2015 FINDINGS: Right Kidney: Length: 12.1 cm. Echogenicity within normal limits. No hydronephrosis. Possible 3 mm stone in the upper pole. Left Kidney: Length: 11.9 cm. Echogenicity within normal limits. No hydronephrosis. Possible 4 mm echogenic stone in the mid pole. Bladder: Appears normal for degree of bladder distention. Incidental left ovarian cysts measuring 3.7 cm IMPRESSION: 1. No hydronephrosis. 2. Possible small stones within both kidneys. Electronically Signed  By: Jasmine Pang M.D.   On: 10/14/2016 19:56    Procedures Procedures (including critical care time)  Medications Ordered in ED Medications  cefTRIAXone (ROCEPHIN) 1 g in dextrose 5 % 50 mL IVPB (1 g Intravenous New Bag/Given 10/14/16 2037)  sodium chloride 0.9 % bolus 1,000 mL (0 mLs Intravenous Stopped 10/14/16 1934)  morphine 4 MG/ML injection 4 mg (4 mg Intravenous Given 10/14/16 1746)  ondansetron (ZOFRAN) injection 4 mg (4 mg Intravenous Given 10/14/16 1746)  ketorolac (TORADOL) 30 MG/ML injection 30 mg (30 mg Intravenous Given 10/14/16 1812)  HYDROmorphone (DILAUDID) injection 1 mg (1 mg Intravenous Given 10/14/16 2008)     Initial Impression / Assessment and Plan / ED Course  I have reviewed the triage vital signs and the nursing notes.  Pertinent labs & imaging results that were available during my care of the patient were reviewed by me and considered in my medical decision making (see chart for details).     Shalon Salado is a 31 y.o. female here presenting with Diane flank pain. Hx of kidney stone. Consider renal colic. Will get labs, US renal, UA. Will give pain meds and reassess.   8:51 PM  WBC 12. UA ? UTI. US showed bilateral intra renal stone with no hydro. I think Diane Stein might have pyelo. Given rocephin, will dc home  with keflex, vicodin for pain.     Final Clinical Impressions(s) / ED Diagnoses   Final diagnoses:  None    New Prescriptions New Prescriptions   No medications on file     Charlynne Pander, MD 10/14/16 2052

## 2016-10-14 NOTE — ED Notes (Signed)
Patient called into the room, and crying in pain, holding her left side

## 2016-10-14 NOTE — Discharge Instructions (Signed)
Take keflex three times daily for 10 days for possible kidney infection.   Take motrin, tylenol for pain.   Take vicodin for severe pain.   You have kidney stones inside your kidney and that is not what is causing your pain currently. You likely have a kidney infection   See your doctor  Return to ER if you have worse abdominal pain, flank pain, vomiting, fevers

## 2016-10-14 NOTE — ED Triage Notes (Signed)
L flank pain x 2 days, denies urinary symptoms and N/V

## 2016-10-16 LAB — URINE CULTURE

## 2016-10-24 ENCOUNTER — Encounter (HOSPITAL_BASED_OUTPATIENT_CLINIC_OR_DEPARTMENT_OTHER): Payer: Self-pay | Admitting: *Deleted

## 2016-10-24 ENCOUNTER — Emergency Department (HOSPITAL_BASED_OUTPATIENT_CLINIC_OR_DEPARTMENT_OTHER)
Admission: EM | Admit: 2016-10-24 | Discharge: 2016-10-24 | Disposition: A | Discharge status: Home / Self Care | Payer: Medicaid Other | Attending: Emergency Medicine | Admitting: Emergency Medicine

## 2016-10-24 ENCOUNTER — Emergency Department (HOSPITAL_BASED_OUTPATIENT_CLINIC_OR_DEPARTMENT_OTHER): Payer: Medicaid Other

## 2016-10-24 DIAGNOSIS — F17210 Nicotine dependence, cigarettes, uncomplicated: Secondary | ICD-10-CM | POA: Diagnosis not present

## 2016-10-24 DIAGNOSIS — Z79899 Other long term (current) drug therapy: Secondary | ICD-10-CM | POA: Insufficient documentation

## 2016-10-24 DIAGNOSIS — M549 Dorsalgia, unspecified: Secondary | ICD-10-CM

## 2016-10-24 DIAGNOSIS — M545 Low back pain: Secondary | ICD-10-CM | POA: Diagnosis not present

## 2016-10-24 DIAGNOSIS — J45909 Unspecified asthma, uncomplicated: Secondary | ICD-10-CM | POA: Diagnosis not present

## 2016-10-24 DIAGNOSIS — R3915 Urgency of urination: Secondary | ICD-10-CM

## 2016-10-24 DIAGNOSIS — R109 Unspecified abdominal pain: Secondary | ICD-10-CM | POA: Diagnosis present

## 2016-10-24 LAB — COMPREHENSIVE METABOLIC PANEL
ALK PHOS: 74 U/L (ref 38–126)
ALT: 18 U/L (ref 14–54)
AST: 20 U/L (ref 15–41)
Albumin: 3.6 g/dL (ref 3.5–5.0)
Anion gap: 8 (ref 5–15)
BILIRUBIN TOTAL: 0.5 mg/dL (ref 0.3–1.2)
BUN: 9 mg/dL (ref 6–20)
CO2: 24 mmol/L (ref 22–32)
Calcium: 9 mg/dL (ref 8.9–10.3)
Chloride: 108 mmol/L (ref 101–111)
Creatinine, Ser: 0.62 mg/dL (ref 0.44–1.00)
GFR calc Af Amer: >60 mL/min (ref >60)
GFR calc non Af Amer: >60 mL/min (ref >60)
GLUCOSE: 106 mg/dL — AB (ref 65–99)
POTASSIUM: 3.7 mmol/L (ref 3.5–5.1)
SODIUM: 140 mmol/L (ref 135–145)
TOTAL PROTEIN: 6.8 g/dL (ref 6.5–8.1)

## 2016-10-24 LAB — CBC WITH DIFFERENTIAL/PLATELET
BASOS ABS: 0 10*3/uL (ref 0.0–0.1)
BASOS PCT: 0 %
EOS ABS: 0.1 10*3/uL (ref 0.0–0.7)
Eosinophils Relative: 1 %
HEMATOCRIT: 37.8 % (ref 36.0–46.0)
HEMOGLOBIN: 12.6 g/dL (ref 12.0–15.0)
Lymphocytes Relative: 20 %
Lymphs Abs: 1.6 10*3/uL (ref 0.7–4.0)
MCH: 31.4 pg (ref 26.0–34.0)
MCHC: 33.3 g/dL (ref 30.0–36.0)
MCV: 94.3 fL (ref 78.0–100.0)
Monocytes Absolute: 0.6 10*3/uL (ref 0.1–1.0)
Monocytes Relative: 7 %
NEUTROS ABS: 5.8 10*3/uL (ref 1.7–7.7)
NEUTROS PCT: 72 %
Platelets: 295 10*3/uL (ref 150–400)
RBC: 4.01 MIL/uL (ref 3.87–5.11)
RDW: 14.2 % (ref 11.5–15.5)
WBC: 8 10*3/uL (ref 4.0–10.5)

## 2016-10-24 LAB — URINALYSIS, ROUTINE W REFLEX MICROSCOPIC
BILIRUBIN URINE: NEGATIVE
Bilirubin Urine: NEGATIVE
Glucose, UA: NEGATIVE mg/dL
Glucose, UA: NEGATIVE mg/dL
Hgb urine dipstick: NEGATIVE
KETONES UR: NEGATIVE mg/dL
KETONES UR: NEGATIVE mg/dL
LEUKOCYTES UA: NEGATIVE
NITRITE: NEGATIVE
NITRITE: NEGATIVE
PH: 6.5 (ref 5.0–8.0)
PROTEIN: NEGATIVE mg/dL
PROTEIN: NEGATIVE mg/dL
Specific Gravity, Urine: 1.02 (ref 1.005–1.030)
Specific Gravity, Urine: 1.022 (ref 1.005–1.030)
pH: 8 (ref 5.0–8.0)

## 2016-10-24 LAB — URINALYSIS, MICROSCOPIC (REFLEX)

## 2016-10-24 LAB — PREGNANCY, URINE: Preg Test, Ur: NEGATIVE

## 2016-10-24 MED ORDER — ONDANSETRON HCL 4 MG/2ML IJ SOLN
4.0000 mg | Freq: Once | INTRAMUSCULAR | Status: AC
  Administered 2016-10-24: 4 mg via INTRAVENOUS
  Filled 2016-10-24: qty 2

## 2016-10-24 MED ORDER — HYDROMORPHONE HCL 1 MG/ML IJ SOLN
1.0000 mg | Freq: Once | INTRAMUSCULAR | Status: AC
Start: 2016-10-24 — End: 2016-10-24
  Administered 2016-10-24: 1 mg via INTRAVENOUS
  Filled 2016-10-24: qty 1

## 2016-10-24 MED ORDER — PHENAZOPYRIDINE HCL 200 MG PO TABS: 200.0000 mg | ORAL_TABLET | Freq: Three times a day (TID) | ORAL | 0 refills | Status: DC

## 2016-10-24 MED ORDER — PHENAZOPYRIDINE HCL 100 MG PO TABS
100.0000 mg | ORAL_TABLET | Freq: Once | ORAL | Status: AC
  Administered 2016-10-24: 100 mg via ORAL
  Filled 2016-10-24: qty 1

## 2016-10-24 MED ORDER — CYCLOBENZAPRINE HCL 10 MG PO TABS: 10.0000 mg | ORAL_TABLET | Freq: Three times a day (TID) | ORAL | 0 refills | Status: DC | PRN

## 2016-10-24 MED ORDER — HYDROMORPHONE HCL 1 MG/ML IJ SOLN
1.0000 mg | Freq: Once | INTRAMUSCULAR | Status: AC
  Administered 2016-10-24: 1 mg via INTRAVENOUS
  Filled 2016-10-24: qty 1

## 2016-10-24 MED ORDER — SODIUM CHLORIDE 0.9 % IV BOLUS (SEPSIS)
1000.0000 mL | Freq: Once | INTRAVENOUS | Status: AC
  Administered 2016-10-24: 1000 mL via INTRAVENOUS

## 2016-10-24 MED FILL — CYCLOBENZAPRINE 10 MG TAB: 10 | 5 days supply | Qty: 15 | Fill #0

## 2016-10-24 NOTE — ED Triage Notes (Signed)
Pt reports being dx with kidney stones and kidney infection a couple of weeks ago.  Reports urinary retention that started this morning.

## 2016-10-24 NOTE — ED Notes (Signed)
Patient transported to CT. In and Out cath will be completed when patient returns.

## 2016-10-24 NOTE — Discharge Instructions (Signed)
Read the information below.  Use the prescribed medication as directed.  Please discuss all new medications with your pharmacist.  You may return to the Emergency Department at any time for worsening condition or any new symptoms that concern you.  If you develop high fevers, worsening back or flank pain, uncontrolled vomiting, or are unable to tolerate fluids by mouth, return to the ER for a recheck.    Your urine did not show infection.  We will treat you symptomatically and refer you to urology, as we discussed.  Your urine has been sent for culture.

## 2016-10-24 NOTE — ED Provider Notes (Signed)
[facility]   CSN: [id number] Arrival date & time: 10/24/16  1332     History   Chief Complaint Chief Complaint  Patient presents with  . Nephrolithiasis    HPI Diane Stein is a 31 y.o. female.  HPI   Pt with hx depression, kidney stones, recent diagnosis 10/14/16 of pyelonephritis, has been taking keflex since with increase in pain and urinary urgency starting today.   The pain is in her bilateral flank and is constant.   She is able to urinate but only small amounts.  Associated nausea.  Denies fevers, chills, myalgias, vomiting, abnormal vaginal discharge or bleeding, bowel changes.    Past Medical History:  Diagnosis Date  . Anxiety   . Asthma   . Bronchitis   . Depression   . Tachycardia 2016    Patient Active Problem List   Diagnosis Date Noted  . Syncope and collapse 08/23/2016  . Depression   . Anxiety   . SVD (spontaneous vaginal delivery) 11/08/2014  . Irregular uterine contractions 11/07/2014  . Palpitations 05/12/2014  . Paroxysmal supraventricular tachycardia (HCC) 05/12/2014  . Pregnancy 05/12/2014    Past Surgical History:  Procedure Laterality Date  . TOOTH EXTRACTION Right 2008    OB History    Gravida Para Term Preterm AB Living   5 1 1   3 1    SAB TAB Ectopic Multiple Live Births   3     0 1       Home Medications    Prior to Admission medications   Medication Sig Start Date End Date Taking? Authorizing Provider  ALPRAZolam (XANAX XR) 1 MG 24 hr tablet Take 1 mg by mouth at bedtime. 08/16/16   [provider]  Aspirin-Acetaminophen-Caffeine (GOODY HEADACHE PO) Take 1 packet by mouth daily as needed (pain/headache).     [provider]  cephALEXin (KEFLEX) 500 MG capsule Take 1 capsule (500 mg total) by mouth 3 (three) times daily. 10/14/16 10/24/16  Charlynne PanderYao, David Hsienta, MD  cyclobenzaprine (FLEXERIL) 10 MG tablet Take 1 tablet (10 mg total) by mouth 3 (three) times daily as needed for  muscle spasms (or pain). 10/24/16   Trixie DredgeWest, Oveta Idris, PA-C  desvenlafaxine (PRISTIQ) 50 MG 24 hr tablet Take 50 mg by mouth See admin instructions. Tapering off - start date 08/16/16: take 100 mg pristiq daily for 4 days, then take 1 tablet (50 mg) daily  for 5 days, then take 1 tablet (50 mg) every other day for 4 days, then stop.    [provider]  FLUoxetine (PROZAC) 10 MG capsule Take 10-20 mg by mouth See admin instructions. Start date 08/16/16: take 1 tablet (10 mg) by mouth daily for 9 days, then take 2 tablets (20 mg) daily 08/16/16   [provider]  HYDROcodone-acetaminophen (NORCO/VICODIN) 5-325 MG tablet Take 1-2 tablets by mouth every 6 (six) hours as needed. 10/14/16   Charlynne PanderYao, David Hsienta, MD  hydrOXYzine (ATARAX/VISTARIL) 25 MG tablet Take 1 tablet (25 mg total) by mouth every 6 (six) hours. 10/05/16   Ward, Chase PicketJaime Pilcher, PA-C  norethindrone-ethinyl estradiol 1/35 (ALAYCEN 1/35) tablet Take 1 tablet by mouth daily.    [provider]  phenazopyridine (PYRIDIUM) 200 MG tablet Take 1 tablet (200 mg total) by mouth 3 (three) times daily. 10/24/16   Trixie DredgeWest, Ocean Schildt, PA-C  predniSONE (DELTASONE) 20 MG tablet Take 2 tablets (40 mg total) by mouth daily. 10/05/16   Ward, Chase PicketJaime Pilcher, PA-C  propranolol ER (INDERAL LA) 80  MG 24 hr capsule Take 80 mg by mouth daily.    [provider]  varenicline (CHANTIX) 1 MG tablet Take 1 mg by mouth 2 (two) times daily.    [provider]    Family History Family History  Problem Relation Age of Onset  . Hypertension Mother   . Heart disease Father   . Heart disease Maternal Grandmother   . Hypertension Maternal Grandmother   . Heart disease Maternal Grandfather   . Hypertension Maternal Grandfather   . Heart disease Paternal Grandmother   . Hypertension Paternal Grandmother   . Heart disease Paternal Grandfather   . Hypertension Paternal Grandfather     Social History Social History  Substance Use Topics  .  Smoking status: Current Every Day Smoker    Packs/day: 0.50    Types: Cigarettes  . Smokeless tobacco: Never Used  . Alcohol use No     Allergies   Codeine; Oxycodone; Oxycodone-acetaminophen; Other; and Benadryl [diphenhydramine hcl]   Review of Systems Review of Systems  All other systems reviewed and are negative.    Physical Exam Updated Vital Signs BP 114/68 (BP Location: Right Arm)   Pulse 87   Temp 98.6 F (37 C) (Oral)   Resp 16   Ht 5\' 3"  (1.6 m)   Wt 72.6 kg (160 lb)   LMP 09/28/2016   SpO2 97%   BMI 28.34 kg/m   Physical Exam  Constitutional: She appears well-developed and well-nourished. No distress.  HENT:  Head: Normocephalic and atraumatic.  Neck: Neck supple.  Cardiovascular: Normal rate and regular rhythm.   Pulmonary/Chest: Effort normal and breath sounds normal. No respiratory distress. She has no wheezes. She has no rales.  Abdominal: Soft. She exhibits no distension. There is no tenderness. There is CVA tenderness (bilateral ). There is no rigidity, no rebound and no guarding.  Neurological: She is alert.  Skin: She is not diaphoretic.  Nursing note and vitals reviewed.    ED Treatments / Results  Labs (all labs ordered are listed, but only abnormal results are displayed) Labs Reviewed  URINALYSIS, ROUTINE W REFLEX MICROSCOPIC - Abnormal; Notable for the following:       Result Value   APPearance TURBID (*)    Hgb urine dipstick LARGE (*)    Leukocytes, UA SMALL (*)    All other components within normal limits  COMPREHENSIVE METABOLIC PANEL - Abnormal; Notable for the following:    Glucose, Bld 106 (*)    All other components within normal limits  URINALYSIS, MICROSCOPIC (REFLEX) - Abnormal; Notable for the following:    Bacteria, UA MANY (*)    Squamous Epithelial / LPF 6-30 (*)    All other components within normal limits  URINE CULTURE  CBC WITH DIFFERENTIAL/PLATELET  PREGNANCY, URINE  URINALYSIS, ROUTINE W REFLEX MICROSCOPIC      EKG  EKG Interpretation None       Radiology Ct Renal Stone Study  Result Date: 10/24/2016 CLINICAL DATA:  Bilateral flank pain. EXAM: CT ABDOMEN AND PELVIS WITHOUT CONTRAST TECHNIQUE: Multidetector CT imaging of the abdomen and pelvis was performed following the standard protocol without IV contrast. COMPARISON:  CT scan dated 12/27/2015 FINDINGS: Lower chest: Slight atelectasis at both lung bases posteriorly. Heart size is normal. Hepatobiliary: No focal liver abnormality is seen. No gallstones, gallbladder wall thickening, or biliary dilatation. Pancreas: Unremarkable. No pancreatic ductal dilatation or surrounding inflammatory changes. Spleen: Normal in size without focal abnormality. Adrenals/Urinary Tract: Adrenal glands are unremarkable. Kidneys are  normal, without renal calculi, focal lesion, or hydronephrosis. No perinephric soft tissue stranding. Bladder is unremarkable. Stomach/Bowel: Stomach is within normal limits. Appendix appears normal. No evidence of bowel wall thickening, distention, or inflammatory changes. Vascular/Lymphatic: No significant vascular findings are present. No enlarged abdominal or pelvic lymph nodes. Reproductive: 3.5 cm cyst on the left ovary, slightly larger than on the prior study. Otherwise negative. Other: No abdominal wall hernia or abnormality. No abdominopelvic ascites. Musculoskeletal: No acute or significant osseous findings. IMPRESSION: Benign-appearing abdomen and pelvis. Slight atelectasis at both lung bases. Specifically, normal appearing kidneys with no renal or ureteral calculi. No CT findings suggestive of pyelonephritis. Electronically Signed   By: Francene Boyers M.D.   On: 10/24/2016 16:16    Procedures Procedures (including critical care time)  Medications Ordered in ED Medications  sodium chloride 0.9 % bolus 1,000 mL (0 mLs Intravenous Stopped 10/24/16 1619)  HYDROmorphone (DILAUDID) injection 1 mg (1 mg Intravenous Given 10/24/16 1433)   ondansetron (ZOFRAN) injection 4 mg (4 mg Intravenous Given 10/24/16 1433)  HYDROmorphone (DILAUDID) injection 1 mg (1 mg Intravenous Given 10/24/16 1623)  phenazopyridine (PYRIDIUM) tablet 100 mg (100 mg Oral Given 10/24/16 1640)     Initial Impression / Assessment and Plan / ED Course  I have reviewed the triage vital signs and the nursing notes.  Pertinent labs & imaging results that were available during my care of the patient were reviewed by me and considered in my medical decision making (see chart for details).  Clinical Course as of Oct 25 1710  Fri Oct 24, 2016  1651 Repeat in and out cath urine does not appear infected.  Pt states symptoms are definitely urinary - having urgency symptoms.  Will treat symptoms and refer to urology.    [EW]    Clinical Course User Index [EW] Chad, Ermine Stebbins, [place]    Afebrile, nontoxic patient with bilateral flank pain and urinary urgency.  CT is unremarkable.  Labs unremarkable.  Initial urine contaminated, repeated as in and out cath and does not appear infected.  Culture sent.  Pt denies vaginal symptoms.  She has tenderness over her thoracic back and urinary symptoms, will treat symptomatically and refer to urology.   D/C home with pyridium, flexeril, urology follow up.   Discussed result, findings, treatment, and follow up  with patient.  Pt given return precautions.  Pt verbalizes understanding and agrees with plan.       Final Clinical Impressions(s) / ED Diagnoses   Final diagnoses:  Urinary urgency  Acute bilateral back pain, unspecified back location    New Prescriptions Discharge Medication List as of 10/24/2016  4:56 PM    START taking these medications   Details  phenazopyridine (PYRIDIUM) 200 MG tablet Take 1 tablet (200 mg total) by mouth 3 (three) times daily., Starting Fri 10/24/2016, Print         Bliss, [place], PA-C 10/24/16 1712    Blane Ohara, MD 10/27/16 [phone]

## 2016-10-26 LAB — URINE CULTURE: CULTURE: NO GROWTH

## 2016-11-05 ENCOUNTER — Ambulatory Visit (INDEPENDENT_AMBULATORY_CARE_PROVIDER_SITE_OTHER): Payer: Medicaid Other

## 2016-11-05 DIAGNOSIS — R55 Syncope and collapse: Secondary | ICD-10-CM | POA: Diagnosis not present

## 2016-11-14 ENCOUNTER — Emergency Department (HOSPITAL_COMMUNITY): Payer: Medicaid Other

## 2016-11-14 ENCOUNTER — Emergency Department (HOSPITAL_COMMUNITY)
Admission: EM | Admit: 2016-11-14 | Discharge: 2016-11-15 | Disposition: A | Discharge status: Home / Self Care | Payer: Medicaid Other | Attending: Emergency Medicine | Admitting: Emergency Medicine

## 2016-11-14 ENCOUNTER — Encounter (HOSPITAL_COMMUNITY): Payer: Self-pay | Admitting: Emergency Medicine

## 2016-11-14 DIAGNOSIS — Z7982 Long term (current) use of aspirin: Secondary | ICD-10-CM | POA: Diagnosis not present

## 2016-11-14 DIAGNOSIS — F17210 Nicotine dependence, cigarettes, uncomplicated: Secondary | ICD-10-CM | POA: Diagnosis not present

## 2016-11-14 DIAGNOSIS — J45909 Unspecified asthma, uncomplicated: Secondary | ICD-10-CM | POA: Insufficient documentation

## 2016-11-14 DIAGNOSIS — R079 Chest pain, unspecified: Secondary | ICD-10-CM | POA: Diagnosis present

## 2016-11-14 DIAGNOSIS — Z79899 Other long term (current) drug therapy: Secondary | ICD-10-CM | POA: Insufficient documentation

## 2016-11-14 LAB — CBC
HCT: 38 % (ref 36.0–46.0)
Hemoglobin: 12.5 g/dL (ref 12.0–15.0)
MCH: 30.6 pg (ref 26.0–34.0)
MCHC: 32.9 g/dL (ref 30.0–36.0)
MCV: 92.9 fL (ref 78.0–100.0)
PLATELETS: 338 10*3/uL (ref 150–400)
RBC: 4.09 MIL/uL (ref 3.87–5.11)
RDW: 13.9 % (ref 11.5–15.5)
WBC: 14.2 10*3/uL — ABNORMAL HIGH (ref 4.0–10.5)

## 2016-11-14 LAB — BASIC METABOLIC PANEL
ANION GAP: 7 (ref 5–15)
BUN: 11 mg/dL (ref 6–20)
CALCIUM: 8.6 mg/dL — AB (ref 8.9–10.3)
CO2: 22 mmol/L (ref 22–32)
Chloride: 104 mmol/L (ref 101–111)
Creatinine, Ser: 0.67 mg/dL (ref 0.44–1.00)
GFR calc Af Amer: >60 mL/min (ref >60)
GLUCOSE: 96 mg/dL (ref 65–99)
Potassium: 4.3 mmol/L (ref 3.5–5.1)
Sodium: 133 mmol/L — ABNORMAL LOW (ref 135–145)

## 2016-11-14 LAB — I-STAT TROPONIN, ED: TROPONIN I, POC: 0.05 ng/mL (ref 0.00–0.08)

## 2016-11-14 MED ORDER — KETOROLAC TROMETHAMINE 30 MG/ML IJ SOLN
15.0000 mg | Freq: Once | INTRAMUSCULAR | Status: AC
  Administered 2016-11-14: 15 mg via INTRAVENOUS
  Filled 2016-11-14: qty 1

## 2016-11-14 MED ORDER — GI COCKTAIL ~~LOC~~
30.0000 mL | Freq: Once | ORAL | Status: AC
  Administered 2016-11-14: 30 mL via ORAL
  Filled 2016-11-14: qty 30

## 2016-11-14 MED ORDER — ONDANSETRON HCL 4 MG/2ML IJ SOLN
4.0000 mg | Freq: Once | INTRAMUSCULAR | Status: AC
  Administered 2016-11-14: 4 mg via INTRAVENOUS
  Filled 2016-11-14: qty 2

## 2016-11-14 MED ORDER — MORPHINE SULFATE (PF) 4 MG/ML IV SOLN
2.0000 mg | Freq: Once | INTRAVENOUS | Status: AC
  Administered 2016-11-14: 2 mg via INTRAVENOUS
  Filled 2016-11-14: qty 1

## 2016-11-14 NOTE — ED Provider Notes (Signed)
[facility]PT Provider Note   CSN: [id number] Arrival date & time: 11/14/16  2014    History   Chief Complaint Chief Complaint  Patient presents with  . Chest Pain    HPI Diane Stein is a 31 y.o. female.  31 year old female with a history of asthma, anxiety, depression, and tachycardia presents to the emergency department for evaluation of chest pain. She states that symptoms began 4 hours ago while she was watching cartoons with her child. She notes pain to her left chest radiating around to her back. Symptoms associated with onset of dizziness as well as lightheadedness. Patient has also felt nauseous with one episode of emesis. Patient further reports palpitations characterized as a pounding heartbeat. She was given 324mg  ASA and 2 SL NTG en route with minimal relief of symptoms. She denies sensation of her heart racing. No syncope, near syncope, leg swelling, recent surgeries or hospitalizations, or prior hx of ACS. No recent travel or medication changes. No fevers or recent viral illness. The patient is currently wearing a holter monitor for evaluation of syncope. She is scheduled to f/u with Dr. Anne Fu of [facility] in July. She is a 4-5 cigarettes per day smoker. Hx of borderline dyslipidemia. No hx of DM of HTN. FHx of ACS in 50's.      Past Medical History:  Diagnosis Date  . Anxiety   . Asthma   . Bronchitis   . Depression   . Tachycardia 2016    Patient Active Problem List   Diagnosis Date Noted  . Syncope and collapse 08/23/2016  . Depression   . Anxiety   . SVD (spontaneous vaginal delivery) 11/08/2014  . Irregular uterine contractions 11/07/2014  . Palpitations 05/12/2014  . Paroxysmal supraventricular tachycardia (HCC) 05/12/2014  . Pregnancy 05/12/2014    Past Surgical History:  Procedure Laterality Date  . TOOTH EXTRACTION Right 2008    OB History    Gravida Para Term Preterm AB Living   5 1 1   3 1    SAB TAB Ectopic Multiple Live Births   3     0 1       Home Medications    Prior to Admission medications   Medication Sig Start Date End Date Taking? Authorizing Provider  acetaminophen (TYLENOL) 325 MG tablet Take 650 mg by mouth every 6 (six) hours as needed (for pain or headaches).   Yes [provider]  Aspirin-Acetaminophen-Caffeine (GOODY HEADACHE PO) Take 1 packet by mouth 2 (two) times daily as needed (pain/headache).    Yes [provider]  FLUoxetine (PROZAC) 10 MG capsule Take 30 mg by mouth daily.  08/16/16  Yes [provider]  LORazepam (ATIVAN) 1 MG tablet Take 1 mg by mouth 3 (three) times daily as needed for anxiety.   Yes [provider]  varenicline (CHANTIX) 1 MG tablet Take 1 mg by mouth 2 (two) times daily.   Yes [provider]  cyclobenzaprine (FLEXERIL) 10 MG tablet Take 1 tablet (10 mg total) by mouth 3 (three) times daily as needed for muscle spasms (or pain). Patient not taking: Reported on 11/14/2016 10/24/16   Trixie Dredge, PA-C  HYDROcodone-acetaminophen (NORCO/VICODIN) 5-325 MG tablet Take 1-2 tablets by mouth every 6 (six) hours as needed. Patient not taking: Reported on 11/14/2016 10/14/16   Charlynne Pander, MD  hydrOXYzine (ATARAX/VISTARIL) 25 MG tablet Take 1 tablet (25 mg total) by mouth every 6 (six) hours. Patient not taking: Reported on 11/14/2016 10/05/16   Ward, Chase Picket,  PA-C  phenazopyridine (PYRIDIUM) 200 MG tablet Take 1 tablet (200 mg total) by mouth 3 (three) times daily. Patient not taking: Reported on 11/14/2016 10/24/16   Trixie DredgeWest, Emily, PA-C  predniSONE (DELTASONE) 20 MG tablet Take 2 tablets (40 mg total) by mouth daily. Patient not taking: Reported on 11/14/2016 10/05/16   Ward, Chase PicketJaime Pilcher, PA-C    Family History Family History  Problem Relation Age of Onset  . Hypertension Mother   . Heart disease Father   . Heart disease Maternal Grandmother   . Hypertension Maternal Grandmother   . Heart disease Maternal Grandfather   .  Hypertension Maternal Grandfather   . Heart disease Paternal Grandmother   . Hypertension Paternal Grandmother   . Heart disease Paternal Grandfather   . Hypertension Paternal Grandfather     Social History Social History  Substance Use Topics  . Smoking status: Current Every Day Smoker    Packs/day: 0.50    Types: Cigarettes  . Smokeless tobacco: Never Used  . Alcohol use No     Allergies   Codeine; Oxycodone; Oxycodone-acetaminophen; Other; and Benadryl [diphenhydramine hcl]   Review of Systems Review of Systems Ten systems reviewed and are negative for acute change, except as noted in the HPI.    Physical Exam Updated Vital Signs BP 120/74   Pulse 71   Temp 98.5 F (36.9 C) (Oral)   Resp 20   SpO2 96%   Physical Exam  Constitutional: She is oriented to person, place, and time. She appears well-developed and well-nourished. No distress.  Nontoxic and in NAD  HENT:  Head: Normocephalic and atraumatic.  Eyes: Conjunctivae and EOM are normal. No scleral icterus.  Neck: Normal range of motion.  No JVD  Cardiovascular: Normal rate, regular rhythm and intact distal pulses.   Pulmonary/Chest: Effort normal. No respiratory distress. She has no wheezes. She has no rales.  Lungs CTAB. Respirations even and unlabored  Abdominal:  Soft, mildly obese, nontender. No masses or guarding.  Musculoskeletal: Normal range of motion.  No BLE edema.  Neurological: She is alert and oriented to person, place, and time. She exhibits normal muscle tone. Coordination normal.  Skin: Skin is warm and dry. No rash noted. She is not diaphoretic. No erythema. No pallor.  Psychiatric: She has a normal mood and affect. Her behavior is normal.  Nursing note and vitals reviewed.    ED Treatments / Results  Labs (all labs ordered are listed, but only abnormal results are displayed) Labs Reviewed  BASIC METABOLIC PANEL - Abnormal; Notable for the following:       Result Value   Sodium 133  (*)    Calcium 8.6 (*)    All other components within normal limits  CBC - Abnormal; Notable for the following:    WBC 14.2 (*)    All other components within normal limits  I-STAT TROPOININ, ED  I-STAT TROPOININ, ED    EKG  EKG Interpretation None       Radiology Dg Chest 2 View  Result Date: 11/14/2016 CLINICAL DATA:  Cp. Pt has been having left sided chest pain for the last 3-4 hours. She's also been feeling dizzy and weak, and mentioned her left arm felt funny earlier. She said she did vomit before her cp started. Smoker of 7 years, she said she's been an off again on again smoker during that time. Hx of asthma, anxiety, bronchitis, tachycardia EXAM: CHEST  2 VIEW COMPARISON:  08/19/2016 FINDINGS: The cardiac silhouette is normal in  size and configuration. Normal mediastinal and hilar contours. Clear lungs.  No pleural effusion or pneumothorax. Skeletal structures are unremarkable. IMPRESSION: No active cardiopulmonary disease. Electronically Signed   By: Amie Portland M.D.   On: 11/14/2016 20:54   Ct Angio Chest Pe W/cm &/or Wo Cm  Result Date: 08/20/2016 CLINICAL DATA:  Left-sided chest pain, onset tonight.  Syncope. EXAM: CT ANGIOGRAPHY CHEST WITH CONTRAST TECHNIQUE: Multidetector CT imaging of the chest was performed using the standard protocol during bolus administration of intravenous contrast. Multiplanar CT image reconstructions and MIPs were obtained to evaluate the vascular anatomy. CONTRAST:  100 cc Isovue 370 IV COMPARISON:  Chest radiographs 2 hours prior. FINDINGS: Cardiovascular: There are no filling defects within the pulmonary arteries to suggest pulmonary embolus. Thoracic aorta is normal in caliber. No aneurysm or evidence of dissection. Heart is normal in size. Mediastinum/Nodes: No mediastinal or hilar adenopathy. No pericardial effusion. The esophagus is decompressed. Visualized thyroid gland is unremarkable. Lungs/Pleura: The lungs are clear. No consolidation,  pleural fluid or pulmonary edema. No pulmonary mass or suspicious nodule. Upper Abdomen: No acute abnormality. Musculoskeletal: No chest wall abnormality. No acute or significant osseous findings. Review of the MIP images confirms the above findings. IMPRESSION: No pulmonary embolus or acute abnormality. Unremarkable CTA of the chest. Electronically Signed   By: Rubye Oaks M.D.   On: 08/20/2016 00:30    Procedures Procedures (including critical care time)  Medications Ordered in ED Medications  ondansetron (ZOFRAN) injection 4 mg (4 mg Intravenous Given 11/14/16 2134)  ketorolac (TORADOL) 30 MG/ML injection 15 mg (15 mg Intravenous Given 11/14/16 2134)  gi cocktail (Maalox,Lidocaine,Donnatal) (30 mLs Oral Given 11/14/16 2324)  morphine 4 MG/ML injection 2 mg (2 mg Intravenous Given 11/14/16 2324)     Initial Impression / Assessment and Plan / ED Course  I have reviewed the triage vital signs and the nursing notes.  Pertinent labs & imaging results that were available during my care of the patient were reviewed by me and considered in my medical decision making (see chart for details).     31 year old female presents to the emergency department for evaluation of chest pain. Chest pain present in the left chest and radiating around her side to her back. She is currently wearing a Holter monitor for evaluation of prior syncope and has plans to follow-up with her cardiologist, Dr. Anne Fu. No c/o syncope with chest pain onset this evening.  Patient with reassuring physical exam. Vital signs stable. No tachycardia or hypoxia. Low suspicion for home and I embolus. Patient has had a recent negative CT angiogram. Symptoms also less suspicious for ACS, especially given age and low heart score. Troponin negative 2. Remainder of laboratory work is reassuring. Chest x-ray without evidence of acute cardiopulmonary abnormality.  Given stability over ED course and reassuring workup, I do not believe the  patient requires further inpatient evaluation. She has been encouraged to follow-up with her cardiologist regarding her visit today. Return precautions discussed and provided. Patient discharged in stable condition with no unaddressed concerns.   Vitals:   11/15/16 0115 11/15/16 0130 11/15/16 0145 11/15/16 0200  BP: 114/66 117/75 113/68 106/67  Pulse: 76 75 72 65  Resp: 19 19 (!) 21 16  Temp:      TempSrc:      SpO2: 96% 95% 96% 97%    Final Clinical Impressions(s) / ED Diagnoses   Final diagnoses:  Chest pain, unspecified type    New Prescriptions New Prescriptions   No medications  on file     Darylene Price 11/15/16 2125    Loren Racer, MD 11/21/16 [phone]

## 2016-11-14 NOTE — ED Triage Notes (Signed)
Per [facility]  Pt coming from home with c/o CP that started approx 2.5 hrs ago. Pt describes the pain as pressure and feels as if its squeezing. Pt already wearing a Holter monitor for 2 weeks now. PT has a hx of svt. Pt is currently in NSR right now. Pt given 324 ASA by FD. Pt emsis x1 and sweaty.

## 2016-11-15 LAB — I-STAT TROPONIN, ED: Troponin i, poc: 0 ng/mL (ref 0.00–0.08)

## 2016-11-25 ENCOUNTER — Ambulatory Visit: Payer: Medicaid Other | Admitting: Nurse Practitioner

## 2016-11-25 NOTE — Progress Notes (Deleted)
CARDIOLOGY OFFICE NOTE  Date:  11/25/2016    Diane Stein Date of Birth: 11/14/1985 Medical Record #[id number]  PCP:  April MansonWhite, Marsha L, NP  Cardiologist:  Anne FuSkains    No chief complaint on file.   History of Present Illness: Diane Stein is a 31 y.o. female who presents today for a post ER visit. Seen for Dr. Anne FuSkains.   Has not been seen here since December of 2015.   She has a history of asthma, anxiety, depression, and tachycardia.   She presented to the emergency department about 2 weeks ago for evaluation of chest pain. Her symptoms began while she was watching cartoons with her child. She notes pain to her left chest radiating around to her back. Symptoms associated with onset of dizziness as well as lightheadedness. Patient has also felt nauseous with one episode of emesis. Patient further reports palpitations characterized as a pounding heartbeat. EMS was called.  She was given 324mg  ASA and 2 SL NTG en route with minimal relief of symptoms. The patient was currently wearing an event monitor for evaluation of syncope. It was noted that she was to see [facility]. Anne [facility]Skains of [facility] in July however I do not see this appointment. She smokes 4-5 cigarettes per day. Hx of borderline dyslipidemia. No hx of DM of HTN. FHx of ACS in 50's.  Negative evaluation there in the ER - negative CTA of the chest - asked to follow up here.   Comes in today. Here with   Past Medical History:  Diagnosis Date  . Anxiety   . Asthma   . Bronchitis   . Depression   . Tachycardia 2016    Past Surgical History:  Procedure Laterality Date  . TOOTH EXTRACTION Right 2008     Medications: No outpatient prescriptions have been marked as taking for the 11/25/16 encounter (Appointment) with Rosalio MacadamiaGerhardt, Kahne Helfand C, NP.     Allergies: Allergies  Allergen Reactions  . Codeine Anaphylaxis  . Oxycodone Anaphylaxis    Patient now states that she can tolerate (??) 11/14/2016  . Oxycodone-Acetaminophen  Hives, Shortness Of Breath and Palpitations    Patient now states that she can tolerate (??) 11/14/2016  . Other Hives    Pine Nuts = HIVES Avacados = HIVES  . Benadryl [Diphenhydramine Hcl] Other (See Comments)    Restless legs result if this is taken    Social History: The patient  reports that she has been smoking Cigarettes.  She has been smoking about 0.50 packs per day. She has never used smokeless tobacco. She reports that she does not drink alcohol or use drugs.   Family History: The patient's family history includes Heart disease in her father, maternal grandfather, maternal grandmother, paternal grandfather, and paternal grandmother; Hypertension in her maternal grandfather, maternal grandmother, mother, paternal grandfather, and paternal grandmother.   Review of Systems: Please see the history of present illness.   Otherwise, the review of systems is positive for none.   All other systems are reviewed and negative.   Physical Exam: VS:  There were no vitals taken for this visit. Marland Kitchen.  BMI There is no height or weight on file to calculate BMI.  Wt Readings from Last 3 Encounters:  10/24/16 160 lb (72.6 kg)  10/14/16 160 lb (72.6 kg)  10/05/16 160 lb (72.6 kg)    General: Pleasant. Well developed, well nourished and in no acute distress.   HEENT: Normal.  Neck: Supple, no JVD, carotid bruits, or masses noted.  Cardiac: Regular rate and rhythm. No murmurs, rubs, or gallops. No edema.  Respiratory:  Lungs are clear to auscultation bilaterally with normal work of breathing.  GI: Soft and nontender.  MS: No deformity or atrophy. Gait and ROM intact.  Skin: Warm and dry. Color is normal.  Neuro:  Strength and sensation are intact and no gross focal deficits noted.  Psych: Alert, appropriate and with normal affect.   LABORATORY DATA:  EKG:  EKG is not ordered today.  Lab Results  Component Value Date   WBC 14.2 (H) 11/14/2016   HGB 12.5 11/14/2016   HCT 38.0 11/14/2016     PLT 338 11/14/2016   GLUCOSE 96 11/14/2016   ALT 18 10/24/2016   AST 20 10/24/2016   NA 133 (L) 11/14/2016   K 4.3 11/14/2016   CL 104 11/14/2016   CREATININE 0.67 11/14/2016   BUN 11 11/14/2016   CO2 22 11/14/2016   TSH 1.170 03/16/2014     BNP (last 3 results) No results for input(s): BNP in the last 8760 hours.  ProBNP (last 3 results) No results for input(s): PROBNP in the last 8760 hours.   Other Studies Reviewed Today:  Echo Study Conclusions from 2015  - Left ventricle: The cavity size was normal. Wall thickness was normal. Systolic function was normal. The estimated ejection fraction was in the range of 55% to 60%. Wall motion was normal; there were no regional wall motion abnormalities. Left ventricular diastolic function parameters were normal.  Impressions:  - Normal study.   Assessment/Plan: 1. Chest pain  2. History of palpitations/syncope - she has an event monitor in place -   3. Tobacco abuse -   Current medicines are reviewed with the patient today.  The patient does not have concerns regarding medicines other than what has been noted above.  The following changes have been made:  See above.  Labs/ tests ordered today include:   No orders of the defined types were placed in this encounter.    Disposition:   FU with Dr. Anne Fu as planned.   Patient is agreeable to this plan and will call if any problems develop in the interim.   SignedNorma Fredrickson, NP  11/25/2016 8:05 AM  [facility] [address] [place], [place]  [address] Phone: [phone] Fax: [phone]

## 2016-11-27 ENCOUNTER — Telehealth: Payer: Self-pay | Admitting: Nurse Practitioner

## 2016-11-27 NOTE — Telephone Encounter (Signed)
Call [employer] for additional patches

## 2016-11-27 NOTE — Telephone Encounter (Signed)
°  1. Is this related to a heart monitor you are wearing?  (If the patient says no, please ask     if they are caling about ICD/pacemaker.)  yes  2. What is your issue?? Patient states that monitor is not sticking because it is so hot outside so she is not sure if our office would be able to get results. (If the patient is calling for results of the heart monitor this     message should be sent to nurse.)

## 2016-12-02 ENCOUNTER — Encounter: Payer: Self-pay | Admitting: Nurse Practitioner

## 2016-12-18 ENCOUNTER — Telehealth: Payer: Self-pay | Admitting: Nurse Practitioner

## 2016-12-18 NOTE — Telephone Encounter (Signed)
Left pt a message to call back. 

## 2016-12-18 NOTE — Telephone Encounter (Signed)
Patient calling, returning call for monitor results.

## 2016-12-19 NOTE — Telephone Encounter (Signed)
Lm for pt to call back ./cy 

## 2016-12-19 NOTE — Telephone Encounter (Signed)
Follow Up:   Pt returning your call from this morning,concerning her monitor results.

## 2016-12-19 NOTE — Telephone Encounter (Signed)
LM TO CALL BACK ,.CY SEE OTHER PHONE NOTE .

## 2016-12-19 NOTE — Telephone Encounter (Signed)
PT  AWARE OF  MONITOR RESULTS ./CY 

## 2016-12-23 ENCOUNTER — Ambulatory Visit (INDEPENDENT_AMBULATORY_CARE_PROVIDER_SITE_OTHER): Payer: Medicaid Other | Admitting: Nurse Practitioner

## 2016-12-23 ENCOUNTER — Encounter: Payer: Self-pay | Admitting: Nurse Practitioner

## 2016-12-23 VITALS — BP 102/78 | HR 69 | Ht 63.0 in | Wt 158.4 lb

## 2016-12-23 DIAGNOSIS — R079 Chest pain, unspecified: Secondary | ICD-10-CM

## 2016-12-23 NOTE — Progress Notes (Signed)
CARDIOLOGY OFFICE NOTE  Date:  12/23/2016    Diane CavalierElizabeth Stein Date of Birth: 03/03/1986 Medical Record #[id number]  PCP:  April MansonWhite, Marsha L, NP  [facility]  Chief Complaint  Patient presents with  . Chest Pain    Post ER visit - seen for Dr. Anne FuSkains    History of Present Illness: Diane Cavalierlizabeth Miltenberger is a 31 y.o. female who presents today for a post hospital visit. Seen for Dr. Anne FuSkains.   Seen originally here back in 2015 for tachycardia. Negative evaluation at that time. She has had issues with chronic anxiety and tobacco abuse. Other issues include asthma and depression.   Presented to the ER last month with chest pain. Started while watching cartoons with her child. Had associated dizziness as well as lightheadedness. Patient has also felt nauseous with one episode of emesis. Patient further reports palpitations characterized as a pounding heartbeat. She was given 324mg  ASA and 2 SL NTG en route with minimal relief of symptoms. Negative evaluation noted.She was wearing an event monitor at the time - which has subsequently been read as benign - see below.   Comes in today. Here alone. Says she "hasn't lost consciousness" so she is dong ok. She feels like her symptoms are more anxiety driven. Still with heart racing, sweating, hard to breath. Chronic issues with anxiety since she was a teenage. She is getting therapy. Says her chest pain is still occurring - describes as "being in like a vice" and tight. Chest pain is in association to the symptoms above. She does get out of breath which she attributes to smoking. Trying to stop smoking. She is using Chantix. 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 FH + for CAD. Says her lipids are borderline.   Past Medical History:  Diagnosis Date  . Anxiety   . Asthma   . Bronchitis   . Depression   . Tachycardia 2016    Past Surgical History:  Procedure Laterality Date  . TOOTH EXTRACTION Right 2008     Medications: Current  Meds  Medication Sig  . acetaminophen (TYLENOL) 325 MG tablet Take 650 mg by mouth every 6 (six) hours as needed (for pain or headaches).  Marland Kitchen. FLUoxetine (PROZAC) 10 MG capsule Take 30 mg by mouth daily.   Marland Kitchen. LORazepam (ATIVAN) 1 MG tablet Take 1 mg by mouth 3 (three) times daily as needed for anxiety.  . varenicline (CHANTIX) 1 MG tablet Take 1 mg by mouth 2 (two) times daily.     Allergies: Allergies  Allergen Reactions  . Codeine Anaphylaxis  . Other Hives    Pine Nuts = HIVES Avacados = HIVES  . Benadryl [Diphenhydramine Hcl] Other (See Comments)    Restless legs result if this is taken    Social History: The patient  reports that she has been smoking Cigarettes.  She has been smoking about 0.50 packs per day. She has never used smokeless tobacco. She reports that she does not drink alcohol or use drugs.   Family History: The patient's family history includes Heart disease in her father, maternal grandfather, maternal grandmother, paternal grandfather, and paternal grandmother; Hypertension in her maternal grandfather, maternal grandmother, mother, paternal grandfather, and paternal grandmother.   Review of Systems: Please see the history of present illness.   Otherwise, the review of systems is positive for none.   All other systems are reviewed and negative.   Physical Exam: VS:  BP 102/78 (BP Location: Left Arm, Patient Position: Sitting, Cuff  Size: Normal)   Pulse 69   Ht 5\' 3"  (1.6 m)   Wt 158 lb 6.4 oz (71.8 kg)   BMI 28.06 kg/m  .  BMI Body mass index is 28.06 kg/m.  Wt Readings from Last 3 Encounters:  12/23/16 158 lb 6.4 oz (71.8 kg)  10/24/16 160 lb (72.6 kg)  10/14/16 160 lb (72.6 kg)    General: Pleasant. Overweight. She is alert and in no acute distress.   HEENT: Normal.  Neck: Supple, no JVD, carotid bruits, or masses noted.  Cardiac: Regular rate and rhythm. No murmurs, rubs, or gallops. No edema.  Respiratory:  Lungs are clear to auscultation  bilaterally with normal work of breathing.  GI: Soft and nontender.  MS: No deformity or atrophy. Gait and ROM intact.  Skin: Warm and dry. Color is normal.  Neuro:  Strength and sensation are intact and no gross focal deficits noted.  Psych: Alert, appropriate and with normal affect.   LABORATORY DATA:  EKG:  EKG is ordered today. This shows NSR and is unchanged.   Lab Results  Component Value Date   WBC 14.2 (H) 11/14/2016   HGB 12.5 11/14/2016   HCT 38.0 11/14/2016   PLT 338 11/14/2016   GLUCOSE 96 11/14/2016   ALT 18 10/24/2016   AST 20 10/24/2016   NA 133 (L) 11/14/2016   K 4.3 11/14/2016   CL 104 11/14/2016   CREATININE 0.67 11/14/2016   BUN 11 11/14/2016   CO2 22 11/14/2016   TSH 1.170 03/16/2014     BNP (last 3 results) No results for input(s): BNP in the last 8760 hours.  ProBNP (last 3 results) No results for input(s): PROBNP in the last 8760 hours.   Other Studies Reviewed Today:  Notes recorded by Jake BatheSkains, Mark C, MD on 12/18/2016 at 8:11 AM EDT  NSR. No pauses. No AFIB.  Had episode of syncope - correlated with NSR/ sinus tachycardia (up to 130bpm, normal)  No evidence of VT or reentrant tachycardia  Reassuring electrical activity of the heart. Donato SchultzMark Skains, MD   Echo Study Conclusions 2015  - Left ventricle: The cavity size was normal. Wall thickness was normal. Systolic function was normal. The estimated ejection fraction was in the range of 55% to 60%. Wall motion was normal; there were no regional wall motion abnormalities. Left ventricular diastolic function parameters were normal.  Impressions: - Normal study.   Assessment/Plan: 1. Chest pain - atypical and sounds more anxiety driven but with risk factors. Will arrange for GXT. Further disposition to follow.   2. Palpitations/syncope - very recent negative event monitor and prior normal echo from 2015 noted - this is reassuring. I do not think we need to repeat her echo - her  exam is normal.   3. Tobacco abuse - now trying Chantix - total cessation encouraged.   4. Anxiety - this seems to be a driving factor - seeing her therapist next week. Discussed at length how to let this be motivating towards healthy behaviors - but easier said then done.   Current medicines are reviewed with the patient today.  The patient does not have concerns regarding medicines other than what has been noted above.  The following changes have been made:  See above.  Labs/ tests ordered today include:   No orders of the defined types were placed in this encounter.    Disposition:   FU with Dr. Anne FuSkains prn.   Patient is agreeable to this plan and will call  if any problems develop in the interim.   SignedNorma Fredrickson, NP  12/23/2016 11:19 AM  [facility] [address] [place], [place]  [address] Phone: [phone] Fax: [phone]

## 2016-12-23 NOTE — Patient Instructions (Addendum)
We will be checking the following labs today - NONE   Medication Instructions:    Continue with your current medicines.     Testing/Procedures To Be Arranged:  GXT  Follow-Up:   Will see how your stress test turns out    Other Special Instructions:   Think about what we talked about today.     If you need a refill on your cardiac medications before your next appointment, please call your pharmacy.   Call the [facility] office at [phone] if you have any questions, problems or concerns.

## 2016-12-26 ENCOUNTER — Encounter: Payer: Self-pay | Admitting: *Deleted

## 2016-12-26 DIAGNOSIS — R079 Chest pain, unspecified: Secondary | ICD-10-CM | POA: Insufficient documentation

## 2016-12-26 DIAGNOSIS — R42 Dizziness and giddiness: Secondary | ICD-10-CM | POA: Insufficient documentation

## 2016-12-26 NOTE — Progress Notes (Signed)
Patient ID: Diane CavalierElizabeth Couper, female   DOB: 11/08/1985, 31 y.o.   MRN: [id number] Patient did not show up for 12/26/16 appointment for a GXT.

## 2017-05-30 ENCOUNTER — Emergency Department (HOSPITAL_BASED_OUTPATIENT_CLINIC_OR_DEPARTMENT_OTHER)
Admission: EM | Admit: 2017-05-30 | Discharge: 2017-05-30 | Disposition: A | Discharge status: Home / Self Care | Payer: Medicaid Other | Attending: Emergency Medicine | Admitting: Emergency Medicine

## 2017-05-30 ENCOUNTER — Other Ambulatory Visit: Payer: Self-pay

## 2017-05-30 ENCOUNTER — Encounter (HOSPITAL_BASED_OUTPATIENT_CLINIC_OR_DEPARTMENT_OTHER): Payer: Self-pay | Admitting: Emergency Medicine

## 2017-05-30 DIAGNOSIS — Z79899 Other long term (current) drug therapy: Secondary | ICD-10-CM | POA: Diagnosis not present

## 2017-05-30 DIAGNOSIS — F17210 Nicotine dependence, cigarettes, uncomplicated: Secondary | ICD-10-CM | POA: Diagnosis not present

## 2017-05-30 DIAGNOSIS — T7840XA Allergy, unspecified, initial encounter: Secondary | ICD-10-CM | POA: Diagnosis not present

## 2017-05-30 DIAGNOSIS — R000 Tachycardia, unspecified: Secondary | ICD-10-CM | POA: Diagnosis not present

## 2017-05-30 DIAGNOSIS — R21 Rash and other nonspecific skin eruption: Secondary | ICD-10-CM | POA: Diagnosis not present

## 2017-05-30 DIAGNOSIS — J45909 Unspecified asthma, uncomplicated: Secondary | ICD-10-CM | POA: Insufficient documentation

## 2017-05-30 MED ORDER — EPINEPHRINE 0.3 MG/0.3ML IJ SOAJ: 0.3000 mg | Freq: Once | INTRAMUSCULAR | 1 refills | Status: AC

## 2017-05-30 MED ORDER — PREDNISONE 10 MG PO TABS
60.0000 mg | ORAL_TABLET | Freq: Once | ORAL | Status: AC
  Administered 2017-05-30: 12:00:00 60 mg via ORAL
  Filled 2017-05-30: qty 1

## 2017-05-30 MED ORDER — PREDNISONE 20 MG PO TABS: 40.0000 mg | ORAL_TABLET | Freq: Every day | ORAL | 0 refills | Status: DC

## 2017-05-30 NOTE — ED Provider Notes (Signed)
[facility] EMERGENCY DEPARTMENT Provider Note   CSN: [id number] Arrival date & time: 05/30/17  1129     History   Chief Complaint Chief Complaint  Patient presents with  . Allergic Reaction    HPI Diane Stein is a 32 y.o. female.  Patient is a 32 year old female with a history of asthma, anxiety, palpitations presenting today with complaint of allergic reaction.  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  She states immediately she started to feel itchy in her upper arms and chest.  She complains that it feels like her heart is beating out of her chest.  She denies any tongue swelling, shortness of breath or wheezing.  She has noticed rashes on her upper extremities but nothing on the lower.  She has not taken any Benadryl due to allergy and has not used EpiPen.   The history is provided by the patient.    Past Medical History:  Diagnosis Date  . Anxiety   . Asthma   . Bronchitis   . Depression   . Tachycardia 2016    Patient Active Problem List   Diagnosis Date Noted  . Chest pain at rest 12/26/2016  . Dizziness 12/26/2016  . Syncope and collapse 08/23/2016  . Depression   . Anxiety   . SVD (spontaneous vaginal delivery) 11/08/2014  . Irregular uterine contractions 11/07/2014  . Palpitations 05/12/2014  . Paroxysmal supraventricular tachycardia (HCC) 05/12/2014  . Pregnancy 05/12/2014    Past Surgical History:  Procedure Laterality Date  . TOOTH EXTRACTION Right 2008    OB History    Gravida Para Term Preterm AB Living   5 1 1   3 1    SAB TAB Ectopic Multiple Live Births   3     0 1       Home Medications    Prior to Admission medications   Medication Sig Start Date End Date Taking? Authorizing Provider  acetaminophen (TYLENOL) 325 MG tablet Take 650 mg by mouth every 6 (six) hours as needed (for pain or headaches).    [provider]  FLUoxetine (PROZAC) 10 MG capsule Take  30 mg by mouth daily.  08/16/16   [provider]  LORazepam (ATIVAN) 1 MG tablet Take 1 mg by mouth 3 (three) times daily as needed for anxiety.    [provider]  varenicline (CHANTIX) 1 MG tablet Take 1 mg by mouth 2 (two) times daily.    [provider]    Family History Family History  Problem Relation Age of Onset  . Hypertension Mother   . Heart disease Father   . Heart disease Maternal Grandmother   . Hypertension Maternal Grandmother   . Heart disease Maternal Grandfather   . Hypertension Maternal Grandfather   . Heart disease Paternal Grandmother   . Hypertension Paternal Grandmother   . Heart disease Paternal Grandfather   . Hypertension Paternal Grandfather     Social History Social History   Tobacco Use  . Smoking status: Current Every Day Smoker    Packs/day: 0.50    Types: Cigarettes  . Smokeless tobacco: Never Used  Substance Use Topics  . Alcohol use: No    Alcohol/week: 0.0 oz  . Drug use: No     Allergies   Codeine; Other; and Benadryl [diphenhydramine hcl]   Review of Systems Review of Systems  All other systems reviewed and are negative.    Physical Exam Updated Vital  Signs BP 134/71 (BP Location: Left Arm)   Pulse 85   Temp 98.4 F (36.9 C) (Oral)   Ht 5\' 3"  (1.6 m)   Wt 68 kg (150 lb)   LMP 05/20/2017   SpO2 100%   BMI 26.57 kg/m   Physical Exam  Constitutional: She is oriented to person, place, and time. She appears well-developed and well-nourished. No distress.  HENT:  Head: Normocephalic and atraumatic.  Mouth/Throat: Oropharynx is clear and moist.  Eyes: Conjunctivae and EOM are normal. Pupils are equal, round, and reactive to light.  Neck: Normal range of motion. Neck supple.  Cardiovascular: Normal rate, regular rhythm and intact distal pulses.  No murmur heard. Pulmonary/Chest: Effort normal and breath sounds normal. No respiratory distress. She has no wheezes. She has no rales.  Chest is  slightly red but no rash  Abdominal: Soft. She exhibits no distension. There is no tenderness. There is no rebound and no guarding.  Musculoskeletal: Normal range of motion. She exhibits no edema or tenderness.  Neurological: She is alert and oriented to person, place, and time.  Skin: Skin is warm and dry. No rash noted. No erythema.  No rashes visualized.  Psychiatric: She has a normal mood and affect. Her behavior is normal.  Nursing note and vitals reviewed.    ED Treatments / Results  Labs (all labs ordered are listed, but only abnormal results are displayed) Labs Reviewed - No data to display  EKG  EKG Interpretation None       Radiology No results found.  Procedures Procedures (including critical care time)  Medications Ordered in ED Medications  predniSONE (DELTASONE) tablet 60 mg (not administered)     Initial Impression / Assessment and Plan / ED Course  I have reviewed the triage vital signs and the nursing notes.  Pertinent labs & imaging results that were available during my care of the patient were reviewed by me and considered in my medical decision making (see chart for details).     Patient presenting here with complaint of allergic reaction to an avocado.  I cannot visualize any visible rashes at this time.  Patient's vital signs are within normal limits and she is not displaying any signs of anaphylaxis.  Patient has an allergy to Benadryl.  We will give oral prednisone.  At this time airway is intact.   1:47 PM Pt remains normal.  Will d/c home. Final Clinical Impressions(s) / ED Diagnoses   Final diagnoses:  Allergic reaction, initial encounter    ED Discharge Orders        Ordered    EPINEPHrine (EPIPEN 2-PAK) 0.3 mg/0.3 mL IJ SOAJ injection   Once     05/30/17 1347    predniSONE (DELTASONE) 20 MG tablet  Daily     05/30/17 1347       Gwyneth Sprout, MD 05/30/17 1348

## 2017-05-30 NOTE — ED Triage Notes (Signed)
Pt reports hives and palpitations after touching an avocado. Pt has known allergy. Denies SOB, oral swelling.

## 2017-06-22 ENCOUNTER — Other Ambulatory Visit: Payer: Self-pay

## 2017-06-22 ENCOUNTER — Emergency Department (HOSPITAL_BASED_OUTPATIENT_CLINIC_OR_DEPARTMENT_OTHER)
Admission: EM | Admit: 2017-06-22 | Discharge: 2017-06-23 | Disposition: A | Discharge status: Home / Self Care | Payer: Medicaid Other | Attending: Emergency Medicine | Admitting: Emergency Medicine

## 2017-06-22 ENCOUNTER — Encounter (HOSPITAL_BASED_OUTPATIENT_CLINIC_OR_DEPARTMENT_OTHER): Payer: Self-pay | Admitting: Emergency Medicine

## 2017-06-22 DIAGNOSIS — T781XXA Other adverse food reactions, not elsewhere classified, initial encounter: Secondary | ICD-10-CM | POA: Insufficient documentation

## 2017-06-22 DIAGNOSIS — F17210 Nicotine dependence, cigarettes, uncomplicated: Secondary | ICD-10-CM | POA: Diagnosis not present

## 2017-06-22 DIAGNOSIS — J45909 Unspecified asthma, uncomplicated: Secondary | ICD-10-CM | POA: Insufficient documentation

## 2017-06-22 DIAGNOSIS — T7840XA Allergy, unspecified, initial encounter: Secondary | ICD-10-CM

## 2017-06-22 DIAGNOSIS — Z79899 Other long term (current) drug therapy: Secondary | ICD-10-CM | POA: Insufficient documentation

## 2017-06-22 DIAGNOSIS — R21 Rash and other nonspecific skin eruption: Secondary | ICD-10-CM | POA: Diagnosis present

## 2017-06-22 NOTE — ED Notes (Signed)
States she is having a reaction to Avocado. She did not take Benadryl. States she is allergic.

## 2017-06-22 NOTE — ED Notes (Signed)
States she is itching and covered in hives. No hives noted. Reassured. No difficulty breathing.

## 2017-06-22 NOTE — ED Triage Notes (Signed)
Patient states that she is allergic to avacodos - she has a generalized Rash at this time with HIVES. Reports that she is allergic to benedryl and she is out of epipens. Patient states that she is "slightly" SOb - also has abdominal pain

## 2017-06-23 MED ORDER — DEXAMETHASONE 6 MG PO TABS
10.0000 mg | ORAL_TABLET | Freq: Once | ORAL | Status: AC
  Administered 2017-06-23: 10 mg via ORAL
  Filled 2017-06-23: qty 1

## 2017-06-23 MED ORDER — EPINEPHRINE 0.3 MG/0.3ML IJ SOAJ: 0.3000 mg | Freq: Once | INTRAMUSCULAR | 2 refills | Status: AC

## 2017-06-23 NOTE — ED Provider Notes (Signed)
[facility] EMERGENCY DEPARTMENT Provider Note   CSN: [id number] Arrival date & time: 06/22/17  1948     History   Chief Complaint Chief Complaint  Patient presents with  . Allergic Reaction    HPI Diane Stein is a 32 y.o. female.  The history is provided by the patient.  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 Anxiety   . Asthma   . Bronchitis   . Depression   . Tachycardia 2016    Patient Active Problem List   Diagnosis Date Noted  . Chest pain at rest 12/26/2016  . Dizziness 12/26/2016  . Syncope and collapse 08/23/2016  . Depression   . Anxiety   . SVD (spontaneous vaginal delivery) 11/08/2014  . Irregular uterine contractions 11/07/2014  . Palpitations 05/12/2014  . Paroxysmal supraventricular tachycardia (HCC) 05/12/2014  . Pregnancy 05/12/2014    Past Surgical History:  Procedure Laterality Date  . TOOTH EXTRACTION Right 2008    OB History    Gravida Para Term Preterm AB Living   5 1 1   3 1    SAB TAB Ectopic Multiple Live Births   3     0 1       Home Medications    Prior to Admission medications   Medication Sig Start Date End Date Taking? Authorizing Provider  acetaminophen (TYLENOL) 325 MG tablet Take 650 mg by mouth every 6 (six) hours as needed (for pain or headaches).    [provider]  EPINEPHrine (EPIPEN 2-PAK) 0.3 mg/0.3 mL IJ SOAJ injection Inject 0.3 mLs (0.3 mg total) into the  muscle once for 1 dose. 06/23/17 06/23/17  Diane RhineWickline, Krystale Rinkenberger, MD  FLUoxetine (PROZAC) 10 MG capsule Take 30 mg by mouth daily.  08/16/16   [provider]  LORazepam (ATIVAN) 1 MG tablet Take 1 mg by mouth 3 (three) times daily as needed for anxiety.    [provider]  varenicline (CHANTIX) 1 MG tablet Take 1 mg by mouth 2 (two) times daily.    [provider]    Family History Family History  Problem Relation Age of Onset  . Hypertension Mother   . Heart disease Father   . Heart disease Maternal Grandmother   . Hypertension Maternal Grandmother   . Heart disease Maternal Grandfather   . Hypertension Maternal Grandfather   . Heart disease Paternal Grandmother   . Hypertension Paternal Grandmother   . Heart disease Paternal Grandfather   . Hypertension Paternal Grandfather     Social History Social History   Tobacco Use  . Smoking status: Current Every Day Smoker    Packs/day: 0.50    Types: Cigarettes  . Smokeless tobacco: Never Used  Substance Use Topics  . Alcohol use: No    Alcohol/week: 0.0 oz  . Drug use: No     Allergies   Codeine; Other; and Benadryl [diphenhydramine hcl]   Review of Systems Review of Systems  Constitutional: Negative for fever.  HENT: Negative for trouble  swallowing.   Gastrointestinal: Negative for vomiting.  Skin: Positive for itching and rash.  Neurological: Negative for syncope.  All other systems reviewed and are negative.    Physical Exam Updated Vital Signs BP 123/72 (BP Location: Right Arm)   Pulse 88   Temp 98.2 F (36.8 C) (Oral)   Resp 18   Ht 1.6 m (5\' 3" )   Wt 68 kg (150 lb)   LMP 06/14/2017   SpO2 99%   BMI 26.57 kg/m   Physical Exam CONSTITUTIONAL: Well developed/well nourished HEAD: Normocephalic/atraumatic EYES: EOMI/PERRL ENMT: Mucous membranes moist, no angioedema, uvula midline, no edema CV: S1/S2 noted, no murmurs/rubs/gallops noted LUNGS: Lungs are clear to auscultation  bilaterally, no apparent distress ABDOMEN: soft, nontender  NEURO: Pt is awake/alert/appropriate, moves all extremitiesx4.  No facial droop.   EXTREMITIES: pulses normal/equal, full ROM SKIN: warm, color normal, minimal rash to skin PSYCH: no abnormalities of mood noted, alert and oriented to situation   ED Treatments / Results  Labs (all labs ordered are listed, but only abnormal results are displayed) Labs Reviewed - No data to display  EKG  EKG Interpretation None       Radiology No results found.  Procedures Procedures (including critical care time)  Medications Ordered in ED Medications  dexamethasone (DECADRON) tablet 10 mg (10 mg Oral Given 06/23/17 0059)     Initial Impression / Assessment and Plan / ED Course  I have reviewed the triage vital signs and the nursing notes.       She requests refill of epipen Pt awake/alert with improving rash No signs of anaphylaxis D/c home   Final Clinical Impressions(s) / ED Diagnoses   Final diagnoses:  Allergic reaction, initial encounter    ED Discharge Orders        Ordered    EPINEPHrine (EPIPEN 2-PAK) 0.3 mg/0.3 mL IJ SOAJ injection   Once     06/23/17 0049       Diane Rhine, MD 06/23/17 [phone]

## 2017-06-23 NOTE — ED Notes (Signed)
ED Provider at bedside. 

## 2017-08-07 ENCOUNTER — Emergency Department (HOSPITAL_BASED_OUTPATIENT_CLINIC_OR_DEPARTMENT_OTHER)
Admission: EM | Admit: 2017-08-07 | Discharge: 2017-08-07 | Disposition: A | Discharge status: Home / Self Care | Payer: Medicaid Other | Attending: Emergency Medicine | Admitting: Emergency Medicine

## 2017-08-07 ENCOUNTER — Encounter (HOSPITAL_BASED_OUTPATIENT_CLINIC_OR_DEPARTMENT_OTHER): Payer: Self-pay | Admitting: Emergency Medicine

## 2017-08-07 ENCOUNTER — Other Ambulatory Visit: Payer: Self-pay

## 2017-08-07 ENCOUNTER — Emergency Department (HOSPITAL_BASED_OUTPATIENT_CLINIC_OR_DEPARTMENT_OTHER): Payer: Medicaid Other

## 2017-08-07 DIAGNOSIS — N83202 Unspecified ovarian cyst, left side: Secondary | ICD-10-CM | POA: Insufficient documentation

## 2017-08-07 DIAGNOSIS — B9689 Other specified bacterial agents as the cause of diseases classified elsewhere: Secondary | ICD-10-CM

## 2017-08-07 DIAGNOSIS — N760 Acute vaginitis: Secondary | ICD-10-CM | POA: Insufficient documentation

## 2017-08-07 DIAGNOSIS — R102 Pelvic and perineal pain: Secondary | ICD-10-CM | POA: Diagnosis present

## 2017-08-07 DIAGNOSIS — Z79899 Other long term (current) drug therapy: Secondary | ICD-10-CM | POA: Diagnosis not present

## 2017-08-07 DIAGNOSIS — J45909 Unspecified asthma, uncomplicated: Secondary | ICD-10-CM | POA: Diagnosis not present

## 2017-08-07 DIAGNOSIS — Z3202 Encounter for pregnancy test, result negative: Secondary | ICD-10-CM | POA: Insufficient documentation

## 2017-08-07 DIAGNOSIS — F17210 Nicotine dependence, cigarettes, uncomplicated: Secondary | ICD-10-CM | POA: Diagnosis not present

## 2017-08-07 LAB — URINALYSIS, ROUTINE W REFLEX MICROSCOPIC
Bilirubin Urine: NEGATIVE
Glucose, UA: NEGATIVE mg/dL
Ketones, ur: 15 mg/dL — AB
Leukocytes, UA: NEGATIVE
Nitrite: NEGATIVE
Protein, ur: NEGATIVE mg/dL
Specific Gravity, Urine: 1.015 (ref 1.005–1.030)
pH: 8 (ref 5.0–8.0)

## 2017-08-07 LAB — CBC WITH DIFFERENTIAL/PLATELET
Basophils Absolute: 0 10*3/uL (ref 0.0–0.1)
Basophils Relative: 0 %
Eosinophils Absolute: 0.1 10*3/uL (ref 0.0–0.7)
Eosinophils Relative: 1 %
HCT: 41.3 % (ref 36.0–46.0)
Hemoglobin: 13.7 g/dL (ref 12.0–15.0)
Lymphocytes Relative: 22 %
Lymphs Abs: 2 10*3/uL (ref 0.7–4.0)
MCH: 31.2 pg (ref 26.0–34.0)
MCHC: 33.2 g/dL (ref 30.0–36.0)
MCV: 94.1 fL (ref 78.0–100.0)
Monocytes Absolute: 0.5 10*3/uL (ref 0.1–1.0)
Monocytes Relative: 6 %
Neutro Abs: 6.6 10*3/uL (ref 1.7–7.7)
Neutrophils Relative %: 71 %
Platelets: 292 10*3/uL (ref 150–400)
RBC: 4.39 MIL/uL (ref 3.87–5.11)
RDW: 14.5 % (ref 11.5–15.5)
WBC: 9.3 10*3/uL (ref 4.0–10.5)

## 2017-08-07 LAB — COMPREHENSIVE METABOLIC PANEL
ALBUMIN: 4.2 g/dL (ref 3.5–5.0)
ALT: 11 U/L — ABNORMAL LOW (ref 14–54)
ANION GAP: 8 (ref 5–15)
AST: 16 U/L (ref 15–41)
Alkaline Phosphatase: 86 U/L (ref 38–126)
BUN: 6 mg/dL (ref 6–20)
CHLORIDE: 106 mmol/L (ref 101–111)
CO2: 23 mmol/L (ref 22–32)
Calcium: 8.8 mg/dL — ABNORMAL LOW (ref 8.9–10.3)
Creatinine, Ser: 0.7 mg/dL (ref 0.44–1.00)
GFR calc Af Amer: >60 mL/min (ref >60)
GFR calc non Af Amer: >60 mL/min (ref >60)
GLUCOSE: 91 mg/dL (ref 65–99)
Potassium: 3.6 mmol/L (ref 3.5–5.1)
SODIUM: 137 mmol/L (ref 135–145)
TOTAL PROTEIN: 7.4 g/dL (ref 6.5–8.1)
Total Bilirubin: 0.5 mg/dL (ref 0.3–1.2)

## 2017-08-07 LAB — URINALYSIS, MICROSCOPIC (REFLEX): WBC UA: NONE SEEN WBC/hpf (ref 0–5)

## 2017-08-07 LAB — PREGNANCY, URINE: Preg Test, Ur: NEGATIVE

## 2017-08-07 LAB — WET PREP, GENITAL
Sperm: NONE SEEN
TRICH WET PREP: NONE SEEN
Yeast Wet Prep HPF POC: NONE SEEN

## 2017-08-07 MED ORDER — METRONIDAZOLE 500 MG PO TABS: 500.0000 mg | ORAL_TABLET | Freq: Two times a day (BID) | ORAL | 0 refills | Status: DC

## 2017-08-07 MED ORDER — KETOROLAC TROMETHAMINE 30 MG/ML IJ SOLN
30.0000 mg | Freq: Once | INTRAMUSCULAR | Status: AC
  Administered 2017-08-07: 30 mg via INTRAVENOUS
  Filled 2017-08-07: qty 1

## 2017-08-07 MED ORDER — MORPHINE SULFATE (PF) 2 MG/ML IV SOLN
2.0000 mg | Freq: Once | INTRAVENOUS | Status: AC
  Administered 2017-08-07: 2 mg via INTRAVENOUS
  Filled 2017-08-07: qty 1

## 2017-08-07 MED ORDER — OXYCODONE-ACETAMINOPHEN 5-325 MG PO TABS: 1.0000 | ORAL_TABLET | Freq: Three times a day (TID) | ORAL | 0 refills | Status: DC | PRN

## 2017-08-07 MED ORDER — ONDANSETRON HCL 4 MG/2ML IJ SOLN
4.0000 mg | Freq: Once | INTRAMUSCULAR | Status: AC
Start: 2017-08-07 — End: 2017-08-07
  Administered 2017-08-07: 4 mg via INTRAVENOUS
  Filled 2017-08-07: qty 2

## 2017-08-07 MED ORDER — SODIUM CHLORIDE 0.9 % IV BOLUS (SEPSIS)
1000.0000 mL | Freq: Once | INTRAVENOUS | Status: AC
  Administered 2017-08-07: 1000 mL via INTRAVENOUS

## 2017-08-07 MED ORDER — KETOROLAC TROMETHAMINE 30 MG/ML IJ SOLN: 30.0000 mg | Freq: Once | INTRAMUSCULAR | Status: DC

## 2017-08-07 NOTE — ED Notes (Signed)
Patient transported to Ultrasound 

## 2017-08-07 NOTE — Discharge Instructions (Signed)
Please read attached information regarding your condition. Follow-up with your OB/GYN for further evaluation. Continue your home medications as previously prescribed.  Take Percocet as needed for severe pain.   return to ED for worsening symptoms, lightheadedness or loss of consciousness, vomiting up blood.

## 2017-08-07 NOTE — ED Provider Notes (Signed)
[facility] EMERGENCY DEPARTMENT Provider Note   CSN: [id number] Arrival date & time: 08/07/17  1507     History   Chief Complaint Chief Complaint  Patient presents with  . Pelvic Pain    HPI Diane Stein is a 32 y.o. female with a past medical history of anxiety, depression, who presents to ED for evaluation of 3-day history of sharp, bilateral pelvic pain radiating to her lower back.  She also reports 5-day history of pink/white vaginal discharge.  She did notice one half dollar sized blood clot past when the vaginal discharge began.  She is sexually active with one female partner and does not use protection.  She reports associated nausea but denies any vomiting, fever, diarrhea, constipation, dysuria.  She does have a history of ovarian cysts.  No improvement in symptoms with Tylenol taken prior to arrival.  HPI  Past Medical History:  Diagnosis Date  . Anxiety   . Asthma   . Bronchitis   . Depression   . Tachycardia 2016    Patient Active Problem List   Diagnosis Date Noted  . Chest pain at rest 12/26/2016  . Dizziness 12/26/2016  . Syncope and collapse 08/23/2016  . Depression   . Anxiety   . SVD (spontaneous vaginal delivery) 11/08/2014  . Irregular uterine contractions 11/07/2014  . Palpitations 05/12/2014  . Paroxysmal supraventricular tachycardia (HCC) 05/12/2014  . Pregnancy 05/12/2014    Past Surgical History:  Procedure Laterality Date  . TOOTH EXTRACTION Right 2008    OB History    Gravida Para Term Preterm AB Living   5 1 1   3 1    SAB TAB Ectopic Multiple Live Births   3     0 1       Home Medications    Prior to Admission medications   Medication Sig Start Date End Date Taking? Authorizing Provider  acetaminophen (TYLENOL) 325 MG tablet Take 650 mg by mouth every 6 (six) hours as needed (for pain or headaches).    [provider]  FLUoxetine (PROZAC) 10 MG capsule Take 30 mg by mouth daily.  08/16/16   [provider]  LORazepam (ATIVAN) 1 MG tablet Take 1 mg by mouth 3 (three) times daily as needed for anxiety.    [provider]  oxyCODONE-acetaminophen (PERCOCET/ROXICET) 5-325 MG tablet Take 1 tablet by mouth every 8 (eight) hours as needed for severe pain. 08/07/17   Jenalyn Girdner, PA-C  varenicline (CHANTIX) 1 MG tablet Take 1 mg by mouth 2 (two) times daily.    [provider]    Family History Family History  Problem Relation Age of Onset  . Hypertension Mother   . Heart disease Father   . Heart disease Maternal Grandmother   . Hypertension Maternal Grandmother   . Heart disease Maternal Grandfather   . Hypertension Maternal Grandfather   . Heart disease Paternal Grandmother   . Hypertension Paternal Grandmother   . Heart disease Paternal Grandfather   . Hypertension Paternal Grandfather     Social History Social History   Tobacco Use  . Smoking status: Current Every Day Smoker    Packs/day: 0.50    Types: Cigarettes  . Smokeless tobacco: Never Used  Substance Use Topics  . Alcohol use: No    Alcohol/week: 0.0 oz  . Drug use: No     Allergies   Codeine; Other; and Benadryl [diphenhydramine hcl]   Review of Systems Review of Systems  Constitutional: Negative for appetite  change, chills and fever.  HENT: Negative for ear pain, rhinorrhea, sneezing and sore throat.   Eyes: Negative for photophobia and visual disturbance.  Respiratory: Negative for cough, chest tightness, shortness of breath and wheezing.   Cardiovascular: Negative for chest pain and palpitations.  Gastrointestinal: Positive for abdominal pain and nausea. Negative for blood in stool, constipation, diarrhea and vomiting.  Genitourinary: Positive for vaginal discharge. Negative for dysuria, hematuria, urgency and vaginal bleeding.  Musculoskeletal: Negative for myalgias.  Skin: Negative for rash.  Neurological: Negative for dizziness, weakness and light-headedness.     Physical  Exam Updated Vital Signs BP 109/67 (BP Location: Right Arm)   Pulse 69   Temp 98.7 F (37.1 C) (Oral)   Resp 16   Ht 5\' 3"  (1.6 m)   Wt 68.9 kg (152 lb)   SpO2 100%   BMI 26.93 kg/m   Physical Exam  Constitutional: She appears well-developed and well-nourished. No distress.  Nontoxic appearing and in no acute distress.  Appears comfortable.  HENT:  Head: Normocephalic and atraumatic.  Nose: Nose normal.  Eyes: Conjunctivae and EOM are normal. Left eye exhibits no discharge. No scleral icterus.  Neck: Normal range of motion. Neck supple.  Cardiovascular: Normal rate, regular rhythm, normal heart sounds and intact distal pulses. Exam reveals no gallop and no friction rub.  No murmur heard. Pulmonary/Chest: Effort normal and breath sounds normal. No respiratory distress.  Abdominal: Soft. Bowel sounds are normal. She exhibits no distension. There is tenderness in the suprapubic area. There is no guarding and no CVA tenderness.    Genitourinary: Uterus normal. Cervix exhibits no motion tenderness and no discharge. Right adnexum displays tenderness. Left adnexum displays tenderness. There is bleeding in the vagina. Vaginal discharge found.  Genitourinary Comments: Pelvic exam: normal external genitalia without evidence of trauma. VULVA: normal appearing vulva with no masses, tenderness or lesion. VAGINA: normal appearing vagina with normal color and white discharge, no lesions. Scant bleeding noted. CERVIX: normal appearing cervix without lesions, cervical motion tenderness absent, cervical os closed with out purulent discharge; No vaginal discharge. Wet prep and DNA probe for chlamydia and GC obtained.   ADNEXA: normal adnexa in size, nontender and no masses UTERUS: uterus is normal size, shape, consistency and nontender.   NT served as chaperone during exam.  Musculoskeletal: Normal range of motion. She exhibits no edema.  Neurological: She is alert. She exhibits normal muscle tone.  Coordination normal.  Skin: Skin is warm and dry. No rash noted.  Psychiatric: She has a normal mood and affect.  Nursing note and vitals reviewed.    ED Treatments / Results  Labs (all labs ordered are listed, but only abnormal results are displayed) Labs Reviewed  WET PREP, GENITAL - Abnormal; Notable for the following components:      Result Value   Clue Cells Wet Prep HPF POC PRESENT (*)    WBC, Wet Prep HPF POC MANY (*)    All other components within normal limits  COMPREHENSIVE METABOLIC PANEL - Abnormal; Notable for the following components:   Calcium 8.8 (*)    ALT 11 (*)    All other components within normal limits  URINALYSIS, ROUTINE W REFLEX MICROSCOPIC - Abnormal; Notable for the following components:   Hgb urine dipstick LARGE (*)    Ketones, ur 15 (*)    All other components within normal limits  URINALYSIS, MICROSCOPIC (REFLEX) - Abnormal; Notable for the following components:   Bacteria, UA RARE (*)    Squamous Epithelial /  LPF 0-5 (*)    All other components within normal limits  CBC WITH DIFFERENTIAL/PLATELET  PREGNANCY, URINE  GC/CHLAMYDIA PROBE AMP ([place] [facility]) NOT AT [facility]    EKG  EKG Interpretation None       Radiology US Transvaginal Non-ob  Result Date: 08/07/2017 CLINICAL DATA:  Bilateral pelvic pain and low back pain x3 days. Irregular menses. EXAM: TRANSABDOMINAL AND TRANSVAGINAL ULTRASOUND OF PELVIS DOPPLER ULTRASOUND OF OVARIES TECHNIQUE: Both transabdominal and transvaginal ultrasound examinations of the pelvis were performed. Transabdominal technique was performed for global imaging of the pelvis including uterus, ovaries, adnexal regions, and pelvic cul-de-sac. It was necessary to proceed with endovaginal exam following the transabdominal exam to visualize the uterus, endometrium and ovaries. Color and duplex Doppler ultrasound was utilized to evaluate blood flow to the ovaries. COMPARISON:  CT from 10/24/2016 FINDINGS: Uterus Measurements:  7.8 x 4 x 5.1 cm. Uterus is slightly retroflexed in appearance. No fibroids or other mass visualized. Endometrium Thickness: 6 mm. Trace fluid and echogenic debris noted within the endometrial cavity at the level of the uterine body and fundus. No color flow to suggest that this is a polyp or submucosal fibroid. This is somewhat oblong in appearance measuring 1.2 x 0.5 x 0.7 cm. Right ovary Measurements: 3.7 x 2.4 x 1.9 cm. Normal appearance/no adnexal mass. 1.9 cm follicle is noted within. Left ovary Measurements: 4 x 2.1 x 2.4 cm. There is a complex paraovarian or exophytic ovarian cyst measuring 3.8 x 3.7 x 3.6 cm with 5 mm mural nodule along its posterior aspect. No demonstrable blood flow is seen within. Pulsed Doppler evaluation of both ovaries demonstrates normal low-resistance arterial and venous waveforms. Other findings Trace free fluid. IMPRESSION: 1. Complex left paraovarian or exophytic cyst measuring 3.8 x 3.7 x 3.6 cm with 5 mm posterior intraluminal mural nodule. No demonstrable flow seen within this nodule. Consider surgical evaluation or MRI for better characterization. Given lack of flow within the nodule, this may represent a benign lesion such as a cystadenofibroma. This would be in keeping with the cyst-like abnormality seen on prior CT from 10/24/2016 size wise. 2. Echogenic debris within the endometrium measuring 1.2 x 0.5 x 0.7 cm without demonstrable blood flow and outlined by hypoechoic fluid within. This may represent products of menstruation. 3. No ovarian torsion. Electronically Signed   By: Tollie Eth M.D.   On: 08/07/2017 17:35   US Pelvis Complete  Result Date: 08/07/2017 CLINICAL DATA:  Bilateral pelvic pain and low back pain x3 days. Irregular menses. EXAM: TRANSABDOMINAL AND TRANSVAGINAL ULTRASOUND OF PELVIS DOPPLER ULTRASOUND OF OVARIES TECHNIQUE: Both transabdominal and transvaginal ultrasound examinations of the pelvis were performed. Transabdominal technique was performed  for global imaging of the pelvis including uterus, ovaries, adnexal regions, and pelvic cul-de-sac. It was necessary to proceed with endovaginal exam following the transabdominal exam to visualize the uterus, endometrium and ovaries. Color and duplex Doppler ultrasound was utilized to evaluate blood flow to the ovaries. COMPARISON:  CT from 10/24/2016 FINDINGS: Uterus Measurements: 7.8 x 4 x 5.1 cm. Uterus is slightly retroflexed in appearance. No fibroids or other mass visualized. Endometrium Thickness: 6 mm. Trace fluid and echogenic debris noted within the endometrial cavity at the level of the uterine body and fundus. No color flow to suggest that this is a polyp or submucosal fibroid. This is somewhat oblong in appearance measuring 1.2 x 0.5 x 0.7 cm. Right ovary Measurements: 3.7 x 2.4 x 1.9 cm. Normal appearance/no adnexal mass. 1.9 cm follicle is  noted within. Left ovary Measurements: 4 x 2.1 x 2.4 cm. There is a complex paraovarian or exophytic ovarian cyst measuring 3.8 x 3.7 x 3.6 cm with 5 mm mural nodule along its posterior aspect. No demonstrable blood flow is seen within. Pulsed Doppler evaluation of both ovaries demonstrates normal low-resistance arterial and venous waveforms. Other findings Trace free fluid. IMPRESSION: 1. Complex left paraovarian or exophytic cyst measuring 3.8 x 3.7 x 3.6 cm with 5 mm posterior intraluminal mural nodule. No demonstrable flow seen within this nodule. Consider surgical evaluation or MRI for better characterization. Given lack of flow within the nodule, this may represent a benign lesion such as a cystadenofibroma. This would be in keeping with the cyst-like abnormality seen on prior CT from 10/24/2016 size wise. 2. Echogenic debris within the endometrium measuring 1.2 x 0.5 x 0.7 cm without demonstrable blood flow and outlined by hypoechoic fluid within. This may represent products of menstruation. 3. No ovarian torsion. Electronically Signed   By: Tollie Eth M.D.    On: 08/07/2017 17:35   [place] Art/ven Flow Abd Pelv Doppler  Result Date: 08/07/2017 CLINICAL DATA:  Bilateral pelvic pain and low back pain x3 days. Irregular menses. EXAM: TRANSABDOMINAL AND TRANSVAGINAL ULTRASOUND OF PELVIS DOPPLER ULTRASOUND OF OVARIES TECHNIQUE: Both transabdominal and transvaginal ultrasound examinations of the pelvis were performed. Transabdominal technique was performed for global imaging of the pelvis including uterus, ovaries, adnexal regions, and pelvic cul-de-sac. It was necessary to proceed with endovaginal exam following the transabdominal exam to visualize the uterus, endometrium and ovaries. Color and duplex Doppler ultrasound was utilized to evaluate blood flow to the ovaries. COMPARISON:  CT from 10/24/2016 FINDINGS: Uterus Measurements: 7.8 x 4 x 5.1 cm. Uterus is slightly retroflexed in appearance. No fibroids or other mass visualized. Endometrium Thickness: 6 mm. Trace fluid and echogenic debris noted within the endometrial cavity at the level of the uterine body and fundus. No color flow to suggest that this is a polyp or submucosal fibroid. This is somewhat oblong in appearance measuring 1.2 x 0.5 x 0.7 cm. Right ovary Measurements: 3.7 x 2.4 x 1.9 cm. Normal appearance/no adnexal mass. 1.9 cm follicle is noted within. Left ovary Measurements: 4 x 2.1 x 2.4 cm. There is a complex paraovarian or exophytic ovarian cyst measuring 3.8 x 3.7 x 3.6 cm with 5 mm mural nodule along its posterior aspect. No demonstrable blood flow is seen within. Pulsed Doppler evaluation of both ovaries demonstrates normal low-resistance arterial and venous waveforms. Other findings Trace free fluid. IMPRESSION: 1. Complex left paraovarian or exophytic cyst measuring 3.8 x 3.7 x 3.6 cm with 5 mm posterior intraluminal mural nodule. No demonstrable flow seen within this nodule. Consider surgical evaluation or MRI for better characterization. Given lack of flow within the nodule, this may represent a  benign lesion such as a cystadenofibroma. This would be in keeping with the cyst-like abnormality seen on prior CT from 10/24/2016 size wise. 2. Echogenic debris within the endometrium measuring 1.2 x 0.5 x 0.7 cm without demonstrable blood flow and outlined by hypoechoic fluid within. This may represent products of menstruation. 3. No ovarian torsion. Electronically Signed   By: Tollie Eth M.D.   On: 08/07/2017 17:35    Procedures Procedures (including critical care time)  Medications Ordered in ED Medications  sodium chloride 0.9 % bolus 1,000 mL (0 mLs Intravenous Stopped 08/07/17 1728)  morphine 2 MG/ML injection 2 mg (2 mg Intravenous Given 08/07/17 1726)  ondansetron (ZOFRAN) injection 4 mg (  4 mg Intravenous Given 08/07/17 1725)  ketorolac (TORADOL) 30 MG/ML injection 30 mg (30 mg Intravenous Given 08/07/17 1822)  morphine 2 MG/ML injection 2 mg (2 mg Intravenous Given 08/07/17 1822)     Initial Impression / Assessment and Plan / ED Course  I have reviewed the triage vital signs and the nursing notes.  Pertinent labs & imaging results that were available during my care of the patient were reviewed by me and considered in my medical decision making (see chart for details).     Patient presents to ED for evaluation of 3-day history of sharp, bilateral pelvic pain radiating to lower back.  She also reports vaginal discharge for the past 5 days.  Denies any vomiting, diarrhea, fever, constipation or dysuria.  She does have a history of ovarian cyst.  No improvement with Tylenol taken prior to arrival.  On physical exam she is overall well-appearing.  She does have some tenderness to palpation of bilateral pelvic area.  Pelvic exam revealed scant blood in the vaginal vault as well as white discharge.  She did have bilateral adnexal tenderness but no cervical motion tenderness and no findings concerning for STD.  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  It has slightly grown in size from 2 cm to 3 cm.  No signs of ovarian torsion.  Suspect that this could be the cause of her symptoms.  CBC, BMP unremarkable.  Urinalysis and urine pregnancy unremarkable.  Wet prep positive for clue cells.  Pain controlled here in the ED.  Will give short course of pain medication in addition to anti-inflammatories which she has at home and advised her to follow-up with her OB/GYN for further evaluation if symptoms persist.  We will also give course of Flagyl for BV.  Patient appears stable for discharge at this time.  Is agreeable to the plan.  Strict return precautions given.   Portions of this note were generated with Scientist, clinical (histocompatibility and immunogenetics). Dictation errors may occur despite best attempts at proofreading.   Final Clinical Impressions(s) / ED Diagnoses   Final diagnoses:  Cyst of left ovary    ED Discharge Orders        Ordered    oxyCODONE-acetaminophen (PERCOCET/ROXICET) 5-325 MG tablet  Every 8 hours PRN     08/07/17 1855       Dietrich Pates, PA-C 08/07/17 1903    Gwyneth Sprout, MD 08/07/17 2352

## 2017-08-07 NOTE — ED Triage Notes (Signed)
Patient states that she is having bilateral pelvic pain that radiates to her flank region

## 2017-08-07 NOTE — ED Notes (Signed)
NAD at this time. Pt is stable and going home.  

## 2017-08-10 LAB — GC/CHLAMYDIA PROBE AMP (~~LOC~~) NOT AT ARMC
CHLAMYDIA, DNA PROBE: NEGATIVE
NEISSERIA GONORRHEA: NEGATIVE

## 2017-08-18 ENCOUNTER — Other Ambulatory Visit: Payer: Self-pay

## 2017-08-18 ENCOUNTER — Emergency Department (HOSPITAL_BASED_OUTPATIENT_CLINIC_OR_DEPARTMENT_OTHER)
Admission: EM | Admit: 2017-08-18 | Discharge: 2017-08-18 | Disposition: A | Discharge status: Home / Self Care | Payer: Medicaid Other | Attending: Emergency Medicine | Admitting: Emergency Medicine

## 2017-08-18 ENCOUNTER — Encounter (HOSPITAL_BASED_OUTPATIENT_CLINIC_OR_DEPARTMENT_OTHER): Payer: Self-pay | Admitting: *Deleted

## 2017-08-18 DIAGNOSIS — J45909 Unspecified asthma, uncomplicated: Secondary | ICD-10-CM | POA: Insufficient documentation

## 2017-08-18 DIAGNOSIS — T7840XA Allergy, unspecified, initial encounter: Secondary | ICD-10-CM | POA: Insufficient documentation

## 2017-08-18 DIAGNOSIS — F17210 Nicotine dependence, cigarettes, uncomplicated: Secondary | ICD-10-CM | POA: Diagnosis not present

## 2017-08-18 DIAGNOSIS — Z79899 Other long term (current) drug therapy: Secondary | ICD-10-CM | POA: Diagnosis not present

## 2017-08-18 MED ORDER — ONDANSETRON 4 MG PO TBDP
4.0000 mg | ORAL_TABLET | Freq: Once | ORAL | Status: AC
  Administered 2017-08-18: 4 mg via ORAL
  Filled 2017-08-18: qty 1

## 2017-08-18 MED ORDER — FAMOTIDINE 20 MG PO TABS
40.0000 mg | ORAL_TABLET | Freq: Once | ORAL | Status: AC
  Administered 2017-08-18: 40 mg via ORAL
  Filled 2017-08-18: qty 2

## 2017-08-18 MED ORDER — OXYCODONE HCL 5 MG PO TABS
5.0000 mg | ORAL_TABLET | Freq: Once | ORAL | Status: AC
  Administered 2017-08-18: 5 mg via ORAL
  Filled 2017-08-18: qty 1

## 2017-08-18 MED ORDER — PREDNISONE 20 MG PO TABS: ORAL_TABLET | ORAL | 0 refills | Status: DC

## 2017-08-18 MED ORDER — MORPHINE SULFATE 15 MG PO TABS: 15.0000 mg | ORAL_TABLET | ORAL | 0 refills | Status: DC | PRN

## 2017-08-18 MED ORDER — HYDROXYZINE HCL 25 MG PO TABS
50.0000 mg | ORAL_TABLET | Freq: Once | ORAL | Status: AC
  Administered 2017-08-18: 50 mg via ORAL
  Filled 2017-08-18: qty 2

## 2017-08-18 MED ORDER — PREDNISONE 50 MG PO TABS
60.0000 mg | ORAL_TABLET | Freq: Once | ORAL | Status: AC
  Administered 2017-08-18: 14:00:00 60 mg via ORAL
  Filled 2017-08-18: qty 1

## 2017-08-18 MED FILL — predniSONE 20 MG TABS: 20 | 4 days supply | Qty: 8 | Fill #0

## 2017-08-18 MED FILL — MORPHINE SULFATE IR 15 MG T: 15 | 3 days supply | Qty: 3 | Fill #0

## 2017-08-18 NOTE — ED Provider Notes (Signed)
[facility] EMERGENCY DEPARTMENT Provider Note   CSN: [id number] Arrival date & time: 08/18/17  1336     History   Chief Complaint Chief Complaint  Patient presents with  . Allergic Reaction    HPI Diane Stein is a 32 y.o. female.  32 yo F with a chief complaint of a possible allergic reaction.  The patient was at work and was putting bottles of shampoo on a shelf when she suddenly felt itchy all over got a rash felt short of breath and felt like she had abdominal cramping.  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  She then got in her car and came to the nearest facility which is ours.  She has epi-pens at home but did not have one at work.  She feels significantly better on arrival still feels like her skin is itching and that her abdomen is cramping.  She states that she has a history of tachycardia.  The history is provided by the patient.  Allergic Reaction  Presenting symptoms: difficulty breathing, itching and rash   Presenting symptoms: no wheezing   Severity:  Moderate Duration:  5 minutes Prior allergic episodes:  Food/nut allergies Context: new detergents/soaps   Context: not cosmetics   Relieved by:  Rest Worsened by:  Nothing Ineffective treatments:  None tried   Past Medical History:  Diagnosis Date  . Anxiety   . Asthma   . Bronchitis   . Depression   . Tachycardia 2016    Patient Active Problem List   Diagnosis Date Noted  . Chest pain at rest 12/26/2016  . Dizziness 12/26/2016  . Syncope and collapse 08/23/2016  . Depression   . Anxiety   . SVD (spontaneous vaginal delivery) 11/08/2014  . Irregular uterine contractions 11/07/2014  . Palpitations 05/12/2014  . Paroxysmal supraventricular tachycardia (HCC) 05/12/2014  . Pregnancy 05/12/2014    Past Surgical History:  Procedure Laterality Date  . TOOTH EXTRACTION Right 2008     OB History    Gravida  5   Para  1   Term  1   Preterm      AB  3   Living  1     SAB  3   TAB      Ectopic      Multiple  0   Live Births  1            Home Medications    Prior to Admission medications   Medication Sig Start Date End Date Taking? Authorizing Provider  acetaminophen (TYLENOL) 325 MG tablet Take 650 mg by mouth every 6 (six) hours as needed (for pain or headaches).    [provider]  FLUoxetine (PROZAC) 10 MG capsule Take 30 mg by mouth daily.  08/16/16   [provider]  LORazepam (ATIVAN) 1 MG tablet Take 1 mg by mouth 3 (three) times daily as needed for anxiety.    [provider]  metroNIDAZOLE (FLAGYL) 500 MG tablet Take 1 tablet (500 mg total) by mouth 2 (two) times daily. 08/07/17   Khatri, Hina, PA-C  morphine (MSIR) 15 MG tablet Take 1 tablet (15 mg total) by mouth every 4 (four) hours as needed for severe pain. 08/18/17   Melene Plan, DO  oxyCODONE-acetaminophen (PERCOCET/ROXICET) 5-325 MG tablet Take 1 tablet by mouth every 8 (eight) hours as needed for severe pain. 08/07/17   Khatri, Hina, PA-C  predniSONE (DELTASONE) 20 MG tablet 2 tabs po daily  x 4 days 08/18/17   Melene PlanFloyd, Izsak Meir, DO  varenicline (CHANTIX) 1 MG tablet Take 1 mg by mouth 2 (two) times daily.    [provider]    Family History Family History  Problem Relation Age of Onset  . Hypertension Mother   . Heart disease Father   . Heart disease Maternal Grandmother   . Hypertension Maternal Grandmother   . Heart disease Maternal Grandfather   . Hypertension Maternal Grandfather   . Heart disease Paternal Grandmother   . Hypertension Paternal Grandmother   . Heart disease Paternal Grandfather   . Hypertension Paternal Grandfather     Social History Social History   Tobacco Use  . Smoking status: Current Every Day Smoker    Packs/day: 0.50    Types: Cigarettes  . Smokeless tobacco: Never Used  Substance Use Topics  . Alcohol use: No    Alcohol/week: 0.0 oz  . Drug use: No     Allergies   Codeine;  Other; Benadryl [diphenhydramine hcl]; and Benadryl [diphenhydramine]   Review of Systems Review of Systems  Constitutional: Negative for chills and fever.  HENT: Negative for congestion and rhinorrhea.   Eyes: Negative for redness and visual disturbance.  Respiratory: Positive for shortness of breath. Negative for wheezing.   Cardiovascular: Positive for palpitations. Negative for chest pain.  Gastrointestinal: Negative for nausea and vomiting.  Genitourinary: Negative for dysuria and urgency.  Musculoskeletal: Negative for arthralgias and myalgias.  Skin: Positive for itching and rash. Negative for pallor and wound.  Neurological: Negative for dizziness and headaches.     Physical Exam Updated Vital Signs BP 123/66   Pulse (!) 106   Temp 98.6 F (37 C) (Oral)   Resp 18   Ht 5\' 3"  (1.6 m)   Wt 69.4 kg (153 lb)   SpO2 98%   BMI 27.10 kg/m   Physical Exam  Constitutional: She is oriented to person, place, and time. She appears well-developed and well-nourished. No distress.  HENT:  Head: Normocephalic and atraumatic.  Eyes: Pupils are equal, round, and reactive to light. EOM are normal.  Neck: Normal range of motion. Neck supple.  Cardiovascular: Regular rhythm. Tachycardia present. Exam reveals no gallop and no friction rub.  No murmur heard. Pulmonary/Chest: Effort normal. She has no wheezes. She has no rales.  Abdominal: Soft. She exhibits no distension. There is no tenderness.  Musculoskeletal: She exhibits no edema or tenderness.  Neurological: She is alert and oriented to person, place, and time.  Skin: Skin is warm and dry. She is not diaphoretic.  Psychiatric: She has a normal mood and affect. Her behavior is normal.  Nursing note and vitals reviewed.    ED Treatments / Results  Labs (all labs ordered are listed, but only abnormal results are displayed) Labs Reviewed - No data to display  EKG None  Radiology No results found.  Procedures Procedures  (including critical care time)  Medications Ordered in ED Medications  hydrOXYzine (ATARAX/VISTARIL) tablet 50 mg (50 mg Oral Given 08/18/17 1409)  famotidine (PEPCID) tablet 40 mg (40 mg Oral Given 08/18/17 1409)  predniSONE (DELTASONE) tablet 60 mg (60 mg Oral Given 08/18/17 1409)  oxyCODONE (Oxy IR/ROXICODONE) immediate release tablet 5 mg (5 mg Oral Given 08/18/17 1443)  ondansetron (ZOFRAN-ODT) disintegrating tablet 4 mg (4 mg Oral Given 08/18/17 1443)     Initial Impression / Assessment and Plan / ED Course  I have reviewed the triage vital signs and the nursing notes.  Pertinent labs &  imaging results that were available during my care of the patient were reviewed by me and considered in my medical decision making (see chart for details).     32 yo F with a chief complaint of an allergic reaction.  This been going on for about 5 minutes and resolved spontaneously.  The patient had touched a avocado shampoo bottle and she has an allergy to avocados.  She denies any other substance getting on her.  She is persistently itchy as well as has some crampy pain to the left lower part of her abdomen.  She denies vomiting or diarrhea.  She is not currently an anaphylactic shock and has only a sensation of itchiness I will not give epinephrine.  We will give histamine blockers and steroids.  I am not sure that this sounds like an allergic reaction as it resolved so spontaneously.  The patient is tachycardic but she says that occurs at her baseline.  On reassessment the patient is feeling better.  Her tachycardia has resolved on my count at bedside.  She is still complaining of left lower quadrant tenderness and states that this is secondary to her ovarian cyst.  She is requesting a narcotic prescription to go home with.  She states that the pharmacy declined her first 1 due to being on multiple benzodiazepines.  On review of the drug database I see that it was not filled.  I will give her 3 tablets.  I  doubt that this is ovarian torsion.  Her symptoms sound most like anxiety though I do question if there was some sort of drug-seeking component as the patient immediately disregarded most of her allergy symptoms and focused on her left lower quadrant tenderness.  I do not feel this needs further workup here.  She has follow-up with GYN tomorrow.  3:05 PM:  I have discussed the diagnosis/risks/treatment options with the patient and believe the pt to be eligible for discharge home to follow-up with GYN. We also discussed returning to the ED immediately if new or worsening sx occur. We discussed the sx which are most concerning (e.g., sudden worsening pain, fever, inability to tolerate by mouth) that necessitate immediate return. Medications administered to the patient during their visit and any new prescriptions provided to the patient are listed below.  Medications given during this visit Medications  hydrOXYzine (ATARAX/VISTARIL) tablet 50 mg (50 mg Oral Given 08/18/17 1409)  famotidine (PEPCID) tablet 40 mg (40 mg Oral Given 08/18/17 1409)  predniSONE (DELTASONE) tablet 60 mg (60 mg Oral Given 08/18/17 1409)  oxyCODONE (Oxy IR/ROXICODONE) immediate release tablet 5 mg (5 mg Oral Given 08/18/17 1443)  ondansetron (ZOFRAN-ODT) disintegrating tablet 4 mg (4 mg Oral Given 08/18/17 1443)     The patient appears reasonably screen and/or stabilized for discharge and I doubt any other medical condition or other [facility] requiring further screening, evaluation, or treatment in the ED at this time prior to discharge.    Final Clinical Impressions(s) / ED Diagnoses   Final diagnoses:  Allergic reaction, initial encounter    ED Discharge Orders        Ordered    morphine (MSIR) 15 MG tablet  Every 4 hours PRN     08/18/17 1501    predniSONE (DELTASONE) 20 MG tablet     08/18/17 1501       Melene Plan, DO 08/18/17 1506

## 2017-08-18 NOTE — ED Triage Notes (Signed)
States she used avocado shampoo 20 minutes ago and has had a rapid heart beat and sob since. She has not taken Benadryl.

## 2017-08-20 NOTE — Patient Instructions (Addendum)
Your procedure is scheduled on: Friday August 28, 2017 at 8:30 am  Enter through the Main Entrance of [facility] at 7:00 am  Pick up the phone at the desk and dial [phone].  Call this number if you have problems the morning of surgery: [phone].  Remember: Do NOT eat food or drink any liquids after: Midnight on Thursday April 4  Take these medicines the morning of surgery with a SIP OF WATER: Prozac  STOP ALL HERBAL SUPPLEMENTS AND VITAMINS 1 WEEK PRIOR TO SURGERY  DO NOT SMOKE DAY OF SURGERY   Do NOT wear jewelry (body piercing), metal hair clips/bobby pins, make-up, or nail polish. Do NOT wear lotions, powders, or perfumes.  You may wear deoderant. Do NOT shave for 48 hours prior to surgery. Do NOT bring valuables to the hospital. Contacts, dentures, or bridgework may not be worn into surgery.  YOU WILL NEED A RESPONSIBLE ADULT TO DRIVE YOU HOME AND STAY WITH YOU FOR 24 HOURS AFTER SURGERY

## 2017-08-21 ENCOUNTER — Encounter (HOSPITAL_COMMUNITY)
Admission: RE | Admit: 2017-08-21 | Discharge: 2017-08-21 | Disposition: A | Discharge status: Home / Self Care | Payer: Medicaid Other | Source: Ambulatory Visit | Attending: Obstetrics and Gynecology | Admitting: Obstetrics and Gynecology

## 2017-08-21 ENCOUNTER — Encounter (HOSPITAL_COMMUNITY): Payer: Self-pay

## 2017-08-21 ENCOUNTER — Other Ambulatory Visit: Payer: Self-pay

## 2017-08-21 DIAGNOSIS — Z01812 Encounter for preprocedural laboratory examination: Secondary | ICD-10-CM | POA: Insufficient documentation

## 2017-08-21 HISTORY — DX: Pneumonia, unspecified organism: J18.9

## 2017-08-21 HISTORY — DX: Cardiac arrhythmia, unspecified: I49.9

## 2017-08-21 LAB — CBC
HEMATOCRIT: 42.9 % (ref 36.0–46.0)
Hemoglobin: 14.7 g/dL (ref 12.0–15.0)
MCH: 31.5 pg (ref 26.0–34.0)
MCHC: 34.3 g/dL (ref 30.0–36.0)
MCV: 91.9 fL (ref 78.0–100.0)
Platelets: 322 10*3/uL (ref 150–400)
RBC: 4.67 MIL/uL (ref 3.87–5.11)
RDW: 14.7 % (ref 11.5–15.5)
WBC: 19.5 10*3/uL — ABNORMAL HIGH (ref 4.0–10.5)

## 2017-08-27 NOTE — H&P (Signed)
Diane Stein is an 32 y.o. female. Has had left sided pelvic pain in the past, but over the past 3 weeks has increasing pelvic pain, initially all over but now on left. Was seen in ED on 3-15, pelvic u/s with 3.8 cm simple left para-ovarian cyst with small excrescence, normal blood flow to left ovary, no other abnormalities on u/s or CT scan. Pain has persisted, no other symptoms. No N/V/F/C, normal urination and BM. By 2 previous CT scans, she has had a simple left adnexal cyst, 2.7 cm in 2017, 3.5 cm 10/2016. She is not getting significant relief with Tylenol and/or ibuprofen.  Pertinent Gynecological History: Last pap: normal Date: 2015 OB History: G4, P1031, SVD   Menstrual History: No LMP recorded.    Past Medical History:  Diagnosis Date  . Anxiety   . Asthma   . Bronchitis   . Depression   . Dysrhythmia    rapid heartrate  . Pneumonia 2014  . Tachycardia 2016    Past Surgical History:  Procedure Laterality Date  . TOOTH EXTRACTION Right 2008    Family History  Problem Relation Age of Onset  . Hypertension Mother   . Heart disease Father   . Heart disease Maternal Grandmother   . Hypertension Maternal Grandmother   . Heart disease Maternal Grandfather   . Hypertension Maternal Grandfather   . Heart disease Paternal Grandmother   . Hypertension Paternal Grandmother   . Heart disease Paternal Grandfather   . Hypertension Paternal Grandfather     Social History:  reports that she has been smoking cigarettes.  She has been smoking about 0.50 packs per day. She has never used smokeless tobacco. She reports that she does not drink alcohol or use drugs.  Allergies:  Allergies  Allergen Reactions  . Other Hives    Pine Nuts = HIVES Avacados = HIVES  . Benadryl [Diphenhydramine Hcl] Other (See Comments)    Restless legs result if this is taken  . Benadryl [Diphenhydramine]     Restless leg syndrome    No medications prior to admission.    Review of Systems   Respiratory: Negative.   Cardiovascular: Negative.     There were no vitals taken for this visit. Physical Exam  Constitutional: She appears well-developed and well-nourished.  Cardiovascular: Normal rate, regular rhythm and normal heart sounds.  No murmur heard. Respiratory: Effort normal and breath sounds normal. No respiratory distress. She has no wheezes.  GI: Soft. She exhibits no distension and no mass.    No results found for this or any previous visit (from the past 24 hour(s)).  No results found.  Assessment/Plan:  Has a 3.8 cm simple cyst in left para-ovarian area, one small excrescence. Has persistent pain. Even though u/s had normal blood flow to the ovary, discussed possibility of torsion. discussed medical and surgical options, she wants the cyst removed.  Discussed surgical procedure and risks, chances of relieving symptoms.  Will admit for laparoscopy and removal of this left adnexal cyst.   Leighton Roachodd D Jody Aguinaga 08/27/2017, 8:09 PM

## 2017-08-28 ENCOUNTER — Encounter (HOSPITAL_COMMUNITY)
Admission: AD | Disposition: A | Discharge status: Home / Self Care | Payer: Self-pay | Source: Ambulatory Visit | Attending: Obstetrics and Gynecology

## 2017-08-28 ENCOUNTER — Ambulatory Visit (HOSPITAL_COMMUNITY): Payer: Medicaid Other | Admitting: Anesthesiology

## 2017-08-28 ENCOUNTER — Ambulatory Visit (HOSPITAL_COMMUNITY)
Admission: AD | Admit: 2017-08-28 | Discharge: 2017-08-28 | Disposition: A | Discharge status: Home / Self Care | Payer: Medicaid Other | Source: Ambulatory Visit | Attending: Obstetrics and Gynecology | Admitting: Obstetrics and Gynecology

## 2017-08-28 ENCOUNTER — Other Ambulatory Visit: Payer: Self-pay

## 2017-08-28 ENCOUNTER — Encounter (HOSPITAL_COMMUNITY): Payer: Self-pay

## 2017-08-28 DIAGNOSIS — R102 Pelvic and perineal pain: Secondary | ICD-10-CM | POA: Insufficient documentation

## 2017-08-28 DIAGNOSIS — Z79899 Other long term (current) drug therapy: Secondary | ICD-10-CM | POA: Diagnosis not present

## 2017-08-28 DIAGNOSIS — F17210 Nicotine dependence, cigarettes, uncomplicated: Secondary | ICD-10-CM | POA: Diagnosis not present

## 2017-08-28 DIAGNOSIS — F329 Major depressive disorder, single episode, unspecified: Secondary | ICD-10-CM | POA: Diagnosis not present

## 2017-08-28 DIAGNOSIS — F419 Anxiety disorder, unspecified: Secondary | ICD-10-CM | POA: Diagnosis not present

## 2017-08-28 DIAGNOSIS — N838 Other noninflammatory disorders of ovary, fallopian tube and broad ligament: Secondary | ICD-10-CM | POA: Diagnosis present

## 2017-08-28 HISTORY — PX: LAPAROSCOPIC UNILATERAL SALPINGECTOMY: SHX5934

## 2017-08-28 HISTORY — PX: LAPAROSCOPY: SHX197

## 2017-08-28 LAB — PREGNANCY, URINE: PREG TEST UR: NEGATIVE

## 2017-08-28 SURGERY — LAPAROSCOPY OPERATIVE
Anesthesia: General | Laterality: Left

## 2017-08-28 MED ORDER — MIDAZOLAM HCL 2 MG/2ML IJ SOLN
INTRAMUSCULAR | Status: AC
  Filled 2017-08-28: qty 2

## 2017-08-28 MED ORDER — LACTATED RINGERS IV SOLN
INTRAVENOUS | Status: DC
  Administered 2017-08-28 (×2): via INTRAVENOUS

## 2017-08-28 MED ORDER — HYDROCODONE-ACETAMINOPHEN 5-325 MG PO TABS: 1.0000 | ORAL_TABLET | Freq: Four times a day (QID) | ORAL | 0 refills | Status: AC | PRN

## 2017-08-28 MED ORDER — FENTANYL CITRATE (PF) 100 MCG/2ML IJ SOLN
25.0000 ug | INTRAMUSCULAR | Status: DC | PRN
  Administered 2017-08-28 (×3): 50 ug via INTRAVENOUS

## 2017-08-28 MED ORDER — FENTANYL CITRATE (PF) 100 MCG/2ML IJ SOLN
INTRAMUSCULAR | Status: AC
  Administered 2017-08-28: 50 ug via INTRAVENOUS
  Filled 2017-08-28: qty 2

## 2017-08-28 MED ORDER — ONDANSETRON HCL 4 MG/2ML IJ SOLN
INTRAMUSCULAR | Status: DC | PRN
  Administered 2017-08-28: 4 mg via INTRAVENOUS

## 2017-08-28 MED ORDER — SODIUM CHLORIDE 0.9 % IJ SOLN
INTRAMUSCULAR | Status: DC | PRN
  Administered 2017-08-28: 10 mL

## 2017-08-28 MED ORDER — SUGAMMADEX SODIUM 200 MG/2ML IV SOLN
INTRAVENOUS | Status: AC
  Filled 2017-08-28: qty 2

## 2017-08-28 MED ORDER — KETOROLAC TROMETHAMINE 30 MG/ML IJ SOLN
INTRAMUSCULAR | Status: AC
  Filled 2017-08-28: qty 1

## 2017-08-28 MED ORDER — PROPOFOL 10 MG/ML IV BOLUS
INTRAVENOUS | Status: AC
  Filled 2017-08-28: qty 20

## 2017-08-28 MED ORDER — GLYCOPYRROLATE 0.2 MG/ML IJ SOLN
INTRAMUSCULAR | Status: AC
  Filled 2017-08-28: qty 1

## 2017-08-28 MED ORDER — SCOPOLAMINE 1 MG/3DAYS TD PT72
1.0000 | MEDICATED_PATCH | Freq: Once | TRANSDERMAL | Status: DC
  Administered 2017-08-28: 1.5 mg via TRANSDERMAL

## 2017-08-28 MED ORDER — KETOROLAC TROMETHAMINE 30 MG/ML IJ SOLN: 30.0000 mg | Freq: Once | INTRAMUSCULAR | Status: DC | PRN

## 2017-08-28 MED ORDER — DEXAMETHASONE SODIUM PHOSPHATE 4 MG/ML IJ SOLN
INTRAMUSCULAR | Status: DC | PRN
  Administered 2017-08-28: 8 mg via INTRAVENOUS

## 2017-08-28 MED ORDER — ROCURONIUM BROMIDE 100 MG/10ML IV SOLN
INTRAVENOUS | Status: AC
Start: 2017-08-28 — End: 2017-08-28
  Filled 2017-08-28: qty 1

## 2017-08-28 MED ORDER — BUPIVACAINE HCL (PF) 0.25 % IJ SOLN
INTRAMUSCULAR | Status: AC
  Filled 2017-08-28: qty 30

## 2017-08-28 MED ORDER — ONDANSETRON HCL 4 MG/2ML IJ SOLN: 4.0000 mg | Freq: Once | INTRAMUSCULAR | Status: DC | PRN

## 2017-08-28 MED ORDER — LIDOCAINE HCL (CARDIAC) 20 MG/ML IV SOLN
INTRAVENOUS | Status: AC
  Filled 2017-08-28: qty 5

## 2017-08-28 MED ORDER — SUGAMMADEX SODIUM 200 MG/2ML IV SOLN
INTRAVENOUS | Status: DC | PRN
  Administered 2017-08-28: 130 mg via INTRAVENOUS

## 2017-08-28 MED ORDER — SCOPOLAMINE 1 MG/3DAYS TD PT72
MEDICATED_PATCH | TRANSDERMAL | Status: AC
  Administered 2017-08-28: 1.5 mg via TRANSDERMAL
  Filled 2017-08-28: qty 1

## 2017-08-28 MED ORDER — MIDAZOLAM HCL 2 MG/2ML IJ SOLN
INTRAMUSCULAR | Status: DC | PRN
  Administered 2017-08-28: 2 mg via INTRAVENOUS

## 2017-08-28 MED ORDER — FENTANYL CITRATE (PF) 250 MCG/5ML IJ SOLN
INTRAMUSCULAR | Status: AC
  Filled 2017-08-28: qty 5

## 2017-08-28 MED ORDER — OXYCODONE HCL 5 MG/5ML PO SOLN: 5.0000 mg | Freq: Once | ORAL | Status: DC | PRN

## 2017-08-28 MED ORDER — ONDANSETRON HCL 4 MG/2ML IJ SOLN
INTRAMUSCULAR | Status: AC
  Filled 2017-08-28: qty 2

## 2017-08-28 MED ORDER — IBUPROFEN 100 MG/5ML PO SUSP
200.0000 mg | Freq: Four times a day (QID) | ORAL | Status: DC | PRN
  Filled 2017-08-28: qty 20

## 2017-08-28 MED ORDER — ROCURONIUM BROMIDE 100 MG/10ML IV SOLN
INTRAVENOUS | Status: DC | PRN
  Administered 2017-08-28: 25 mg via INTRAVENOUS
  Administered 2017-08-28: 5 mg via INTRAVENOUS

## 2017-08-28 MED ORDER — GLYCOPYRROLATE 0.2 MG/ML IJ SOLN
INTRAMUSCULAR | Status: DC | PRN
  Administered 2017-08-28: 0.1 mg via INTRAVENOUS

## 2017-08-28 MED ORDER — FENTANYL CITRATE (PF) 100 MCG/2ML IJ SOLN
INTRAMUSCULAR | Status: AC
  Filled 2017-08-28: qty 2

## 2017-08-28 MED ORDER — KETOROLAC TROMETHAMINE 30 MG/ML IJ SOLN
INTRAMUSCULAR | Status: DC | PRN
  Administered 2017-08-28: 30 mg via INTRAVENOUS

## 2017-08-28 MED ORDER — MEPERIDINE HCL 25 MG/ML IJ SOLN
INTRAMUSCULAR | Status: AC
  Filled 2017-08-28: qty 1

## 2017-08-28 MED ORDER — IBUPROFEN 200 MG PO TABS
200.0000 mg | ORAL_TABLET | Freq: Four times a day (QID) | ORAL | Status: DC | PRN
  Filled 2017-08-28: qty 2

## 2017-08-28 MED ORDER — PROPOFOL 10 MG/ML IV BOLUS
INTRAVENOUS | Status: DC | PRN
  Administered 2017-08-28: 40 mg via INTRAVENOUS
  Administered 2017-08-28: 50 mg via INTRAVENOUS
  Administered 2017-08-28: 150 mg via INTRAVENOUS
  Administered 2017-08-28: 50 mg via INTRAVENOUS

## 2017-08-28 MED ORDER — FENTANYL CITRATE (PF) 100 MCG/2ML IJ SOLN
INTRAMUSCULAR | Status: DC | PRN
  Administered 2017-08-28 (×7): 50 ug via INTRAVENOUS

## 2017-08-28 MED ORDER — LIDOCAINE HCL 1 % IJ SOLN
INTRAMUSCULAR | Status: AC
  Filled 2017-08-28: qty 20

## 2017-08-28 MED ORDER — MEPERIDINE HCL 25 MG/ML IJ SOLN
6.2500 mg | INTRAMUSCULAR | Status: DC | PRN
  Administered 2017-08-28: 12.5 mg via INTRAVENOUS

## 2017-08-28 MED ORDER — SODIUM CHLORIDE 0.9 % IJ SOLN
INTRAMUSCULAR | Status: AC
  Filled 2017-08-28: qty 10

## 2017-08-28 MED ORDER — DEXAMETHASONE SODIUM PHOSPHATE 10 MG/ML IJ SOLN
INTRAMUSCULAR | Status: AC
  Filled 2017-08-28: qty 1

## 2017-08-28 MED ORDER — BUPIVACAINE HCL (PF) 0.25 % IJ SOLN
INTRAMUSCULAR | Status: DC | PRN
  Administered 2017-08-28: 17 mL

## 2017-08-28 MED ORDER — LIDOCAINE HCL (CARDIAC) 20 MG/ML IV SOLN
INTRAVENOUS | Status: DC | PRN
  Administered 2017-08-28: 60 mg via INTRAVENOUS

## 2017-08-28 MED ORDER — OXYCODONE HCL 5 MG PO TABS: 5.0000 mg | ORAL_TABLET | Freq: Once | ORAL | Status: DC | PRN

## 2017-08-28 SURGICAL SUPPLY — 38 items
ADH SKN CLS APL DERMABOND .7 (GAUZE/BANDAGES/DRESSINGS) ×1
BAG SPEC RTRVL LRG 6X4 10 (ENDOMECHANICALS) ×1
CABLE HIGH FREQUENCY MONO STRZ (ELECTRODE) IMPLANT
CATH ROBINSON RED A/P 16FR (CATHETERS) IMPLANT
DECANTER SPIKE VIAL GLASS SM (MISCELLANEOUS) ×3 IMPLANT
DERMABOND ADVANCED (GAUZE/BANDAGES/DRESSINGS) ×2
DERMABOND ADVANCED .7 DNX12 (GAUZE/BANDAGES/DRESSINGS) ×1 IMPLANT
DRSG OPSITE POSTOP 3X4 (GAUZE/BANDAGES/DRESSINGS) ×2 IMPLANT
DURAPREP 26ML APPLICATOR (WOUND CARE) ×3 IMPLANT
GLOVE BIO SURGEON STRL SZ8 (GLOVE) ×3 IMPLANT
GLOVE BIOGEL PI IND STRL 7.0 (GLOVE) ×2 IMPLANT
GLOVE BIOGEL PI INDICATOR 7.0 (GLOVE) ×4
GLOVE ORTHO TXT STRL SZ7.5 (GLOVE) ×3 IMPLANT
GOWN STRL REUS W/TWL 2XL LVL3 (GOWN DISPOSABLE) ×3 IMPLANT
GOWN STRL REUS W/TWL LRG LVL3 (GOWN DISPOSABLE) ×6 IMPLANT
NDL EPID 17G 5 ECHO TUOHY (NEEDLE) IMPLANT
NEEDLE EPID 17G 5 ECHO TUOHY (NEEDLE) IMPLANT
NEEDLE INSUFFLATION 120MM (ENDOMECHANICALS) ×3 IMPLANT
NS IRRIG 1000ML POUR BTL (IV SOLUTION) ×3 IMPLANT
PACK LAPAROSCOPY BASIN (CUSTOM PROCEDURE TRAY) ×3 IMPLANT
PACK TRENDGUARD 450 HYBRID PRO (MISCELLANEOUS) IMPLANT
PACK TRENDGUARD 600 HYBRD PROC (MISCELLANEOUS) IMPLANT
POUCH SPECIMEN RETRIEVAL 10MM (ENDOMECHANICALS) ×2 IMPLANT
PROTECTOR NERVE ULNAR (MISCELLANEOUS) ×6 IMPLANT
SET IRRIG TUBING LAPAROSCOPIC (IRRIGATION / IRRIGATOR) IMPLANT
SHEARS HARMONIC ACE PLUS 36CM (ENDOMECHANICALS) ×2 IMPLANT
SLEEVE XCEL OPT CAN 5 100 (ENDOMECHANICALS) ×3 IMPLANT
SOLUTION ELECTROLUBE (MISCELLANEOUS) ×2 IMPLANT
SUT VICRYL 0 UR6 27IN ABS (SUTURE) ×4 IMPLANT
SUT VICRYL 4-0 PS2 18IN ABS (SUTURE) ×3 IMPLANT
TOWEL OR 17X24 6PK STRL BLUE (TOWEL DISPOSABLE) ×6 IMPLANT
TRAY FOLEY CATH SILVER 14FR (SET/KITS/TRAYS/PACK) ×2 IMPLANT
TRENDGUARD 450 HYBRID PRO PACK (MISCELLANEOUS) ×3
TRENDGUARD 600 HYBRID PROC PK (MISCELLANEOUS)
TROCAR BALLN 12MMX100 BLUNT (TROCAR) ×2 IMPLANT
TROCAR XCEL NON-BLD 11X100MML (ENDOMECHANICALS) IMPLANT
TROCAR XCEL NON-BLD 5MMX100MML (ENDOMECHANICALS) ×3 IMPLANT
WARMER LAPAROSCOPE (MISCELLANEOUS) ×3 IMPLANT

## 2017-08-28 NOTE — Op Note (Signed)
Preoperative diagnosis: Pelvic pain, left paratubal cyst Postoperative diagnosis: Same, possible endometriosis Procedure: Open laparoscopy, left salpingectomy with removal of left paratubal cyst, fulguration of possible endometriosis Surgeon: Lavina Hamman M.D.  Anesthesia: Gen. Endotracheal tube  Findings: She had a normal abdomen and pelvis with normal uterus and ovaries, she had a 4 cm left paratubal cyst, she had implants in the left ovarian fossa consistent with endometriosis Specimens: Left fallopian tube and paratubal cyst for routine pathology Estimated blood loss: Minimal  Complications: None  Procedure in detail:  The patient was taken to the operating room and placed in the dorsosupine position. General anesthesia was induced. Her legs were placed in mobile stirrups and her left arm was tucked to her side. Abdomen perineum and vagina were then prepped and draped in the usual sterile fashion, bladder drained with a red Robinson catheter, a Hulka tenaculum was applied to the cervix for uterine manipulation. Infraumbilical skin was then infiltrated with quarter percent Marcaine and a 3 cm horizontal incision was made and carried down to the level of the fascia.  The fascia was grasped and elevated and entered sharply with scissors.  Peritoneum was then entered bluntly.  A pursestring suture of 0 Vicryl was placed around the fascia and the Hassan cannula was inserted.  A 5 mm scope was then inserted through this trocar with good visualization.  5 mm ports were then placed on the left side and low in the midline also under direct visualization. Careful and thorough inspection revealed the above-mentioned findings.  I initially attempted to dissect the paratubal cyst from the left tube by grasping the tissue over the cyst.  However in doing this the cyst ruptured making it impossible to tell where the cyst started and ended since it was closely closely associated with the fallopian tube.  I then  elected to proceed with distal left salpingectomy.  Harmonic scalpel was used for this coming across a proximal portion of the fallopian tube and then coming across the mesosalpinx and freeing the tube and all other tissue that contained the cyst from the ovary.  This was done with good visualization and adequate hemostasis.  The specimen was then placed in the anterior cul-de-sac to be removed later.  Some adhesions of the colon to the left pelvic sidewall were taken down with harmonic scalpel just to aid in visualization.  On further inspection it appeared that there was some endometriosis in the left ovarian fossa.  This was fulgurated with the harmonic scalpel.  No other source for her pain was identified.  The 5 mm scope was removed and placed through the left-sided port.  An Endo Catch bag was placed through the umbilical trocar and the tube and cyst were scooped into the bag.  This was then easily removed through the umbilical incision by removing the trocar.  The previously placed pursestring suture was then tied and good fascial closure was achieved.  Abdomen was reinsufflated through the left port and inspection again revealed good hemostasis and no other source for her pain.  The 5 mm port in the midline was removed under direct visualization. All gas was allowed to deflate from the abdomen and the remaining 5 mm trocar was removed. Skin incisions were then closed with subcuticular sutures of 4-0 Vicryl followed by Dermabond. The Hulka tenaculum was removed. The patient was taken down from stirrups. She was awakened in the operating room and taken to the recovery room in stable condition after tolerating the procedure well. Counts were  correct and she had PAS hose on throughout the procedure.

## 2017-08-28 NOTE — Anesthesia Postprocedure Evaluation (Signed)
Anesthesia Post Note  Patient: Diane Stein  Procedure(s) Performed: LAPAROSCOPY OPERATIVE, FULGERATION OF ENDOMETRIOSIS (Left ) LAPAROSCOPIC UNILATERAL SALPINGECTOMY WITH REMOVAL OF PARA TUBAL CYST (Left )     Patient location during evaluation: PACU Anesthesia Type: General Level of consciousness: awake Pain management: pain level controlled Vital Signs Assessment: post-procedure vital signs reviewed and stable Respiratory status: spontaneous breathing Cardiovascular status: stable Postop Assessment: no apparent nausea or vomiting Anesthetic complications: no    Last Vitals:  Vitals:   08/28/17 1100 08/28/17 1115  BP: (!) 104/50 (!) 102/55  Pulse: 69 72  Resp: 13 18  Temp:    SpO2: 95% 97%    Last Pain:  Vitals:   08/28/17 1115  TempSrc:   PainSc: 5    Pain Goal: Patients Stated Pain Goal: 3 (08/28/17 1115)               Alaria Oconnor JR,JOHN Susann GivensFRANKLIN

## 2017-08-28 NOTE — Anesthesia Procedure Notes (Signed)
Procedure Name: Intubation Date/Time: 08/28/2017 8:36 AM Performed by: Georgeanne Nim, CRNA Pre-anesthesia Checklist: Patient identified, Emergency Drugs available, Suction available, Patient being monitored and Timeout performed Patient Re-evaluated:Patient Re-evaluated prior to induction Oxygen Delivery Method: Circle system utilized Preoxygenation: Pre-oxygenation with 100% oxygen Induction Type: IV induction Ventilation: Mask ventilation without difficulty Laryngoscope Size: Mac and 3 Grade View: Grade I Tube size: 7.0 mm Number of attempts: 1 Airway Equipment and Method: Stylet Placement Confirmation: ETT inserted through vocal cords under direct vision,  positive ETCO2,  CO2 detector and breath sounds checked- equal and bilateral Secured at: 20 cm Tube secured with: Tape Dental Injury: Teeth and Oropharynx as per pre-operative assessment

## 2017-08-28 NOTE — Anesthesia Preprocedure Evaluation (Signed)
Anesthesia Evaluation  Patient identified by MRN, date of birth, ID band Patient awake    Reviewed: Allergy & Precautions, NPO status , Patient's Chart, lab work & pertinent test results  Airway Mallampati: I       Dental no notable dental hx. (+) Teeth Intact   Pulmonary Current Smoker,    Pulmonary exam normal breath sounds clear to auscultation       Cardiovascular negative cardio ROS Normal cardiovascular exam Rhythm:Regular Rate:Normal     Neuro/Psych PSYCHIATRIC DISORDERS Anxiety Depression    GI/Hepatic negative GI ROS, Neg liver ROS,   Endo/Other  negative endocrine ROS  Renal/GU negative Renal ROS  negative genitourinary   Musculoskeletal negative musculoskeletal ROS (+)   Abdominal Normal abdominal exam  (+)   Peds  Hematology negative hematology ROS (+)   Anesthesia Other Findings   Reproductive/Obstetrics negative OB ROS                             Anesthesia Physical Anesthesia Plan  ASA: II  Anesthesia Plan: General   Post-op Pain Management:    Induction: Intravenous  PONV Risk Score and Plan: 4 or greater  Airway Management Planned: Oral ETT  Additional Equipment:   Intra-op Plan:   Post-operative Plan: Extubation in OR  Informed Consent: I have reviewed the patients History and Physical, chart, labs and discussed the procedure including the risks, benefits and alternatives for the proposed anesthesia with the patient or authorized representative who has indicated his/her understanding and acceptance.   Dental advisory given  Plan Discussed with: CRNA and Surgeon  Anesthesia Plan Comments:         Anesthesia Quick Evaluation

## 2017-08-28 NOTE — Interval H&P Note (Signed)
History and Physical Interval Note:  08/28/2017 8:11 AM  Diane CavalierElizabeth Starkey  has presented today for surgery, with the diagnosis of left pelvic pain and cyst  The various methods of treatment have been discussed with the patient and family. After consideration of risks, benefits and other options for treatment, the patient has consented to  Procedure(s): LAPAROSCOPY OPERATIVE (Left) as a surgical intervention .  The patient's history has been reviewed, patient examined, no change in status, stable for surgery.  I have reviewed the patient's chart and labs.  Questions were answered to the patient's satisfaction.     Leighton Roachodd D Aliese Brannum

## 2017-08-28 NOTE — Discharge Instructions (Signed)
DISCHARGE INSTRUCTIONS: Laparoscopy  Wound care:  Do not get the incision wet for the first 24 hours. The incision should be kept clean and dry.  The Band-Aids or dressings may be removed the day after surgery.  Should the incision become sore, red, and swollen after the first week, check with your doctor.  Personal hygiene:  Shower the day after your procedure.  Activity and limitations:  Do NOT drive or operate any equipment today.  Do NOT lift anything more than 15 pounds for 2-3 weeks after surgery.  Do NOT rest in bed all day.  Walking is encouraged. Walk each day, starting slowly with 5-minute walks 3 or 4 times a day. Slowly increase the length of your walks.  Walk up and down stairs slowly.  Do NOT do strenuous activities, such as golfing, playing tennis, bowling, running, biking, weight lifting, gardening, mowing, or vacuuming for 2-4 weeks. Ask your doctor when it is okay to start.  Diet: Eat a light meal as desired this evening. You may resume your usual diet tomorrow.  Return to work: This is dependent on the type of work you do. For the most part you can return to a desk job within a week of surgery. If you are more active at work, please discuss this with your doctor.  What to expect after your surgery: You may have a slight burning sensation when you urinate on the first day. You may have a very small amount of blood in the urine. Expect to have a small amount of vaginal discharge/light bleeding for 1-2 weeks. It is not unusual to have abdominal soreness and bruising for up to 2 weeks. You may be tired and need more rest for about 1 week. You may experience shoulder pain for 24-72 hours. Lying flat in bed may relieve it.  Call your doctor for any of the following:  Develop a fever of 100.4 or greater  Inability to urinate 6 hours after discharge from hospital  Severe pain not relieved by pain medications  Persistent of heavy bleeding at incision site   Redness or swelling around incision site after a week  Increasing nausea or vomiting  Activity: Get plenty of rest for the remainder of the day. A responsible individual must stay with you for 24 hours following the procedure.  For the next 24 hours, DO NOT: -Drive a car -Advertising copywriterperate machinery -Drink alcoholic beverages -Take any medication unless instructed by your physician -Make any legal decisions or sign important papers.  Special Instructions/Symptoms: Your throat may feel dry or sore from the anesthesia or the breathing tube placed in your throat during surgery. If this causes discomfort, gargle with warm salt water. The discomfort should disappear within 24 hours.  If you had a scopolamine patch placed behind your ear for the management of post- operative nausea and/or vomiting:  1. The medication in the patch is effective for 72 hours, after which it should be removed.  Wrap patch in a tissue and discard in the trash. Wash hands thoroughly with soap and water. 2. You may remove the patch earlier than 72 hours if you experience unpleasant side effects which may include dry mouth, dizziness or visual disturbances. 3. Avoid touching the patch. Wash your hands with soap and water after contact with the patch.

## 2017-08-28 NOTE — Transfer of Care (Signed)
Immediate Anesthesia Transfer of Care Note  Patient: Diane CavalierElizabeth Griess  Procedure(s) Performed: LAPAROSCOPY OPERATIVE, FULGERATION OF ENDOMETRIOSIS (Left ) LAPAROSCOPIC UNILATERAL SALPINGECTOMY WITH REMOVAL OF PARA TUBAL CYST (Left )  Patient Location: PACU  Anesthesia Type:General  Level of Consciousness: awake, alert  and patient cooperative  Airway & Oxygen Therapy: Patient Spontanous Breathing and Patient connected to nasal cannula oxygen  Post-op Assessment: Report given to RN and Post -op Vital signs reviewed and stable  Post vital signs: Reviewed and stable  Last Vitals:  Vitals Value Taken Time  BP 106/59 08/28/2017  9:48 AM  Temp    Pulse 82 08/28/2017  9:53 AM  Resp 12 08/28/2017  9:53 AM  SpO2 100 % 08/28/2017  9:53 AM  Vitals shown include unvalidated device data.  Last Pain:  Vitals:   08/28/17 0659  TempSrc: Oral  PainSc: 8          Complications: No apparent anesthesia complications

## 2017-08-29 ENCOUNTER — Encounter (HOSPITAL_COMMUNITY): Payer: Self-pay | Admitting: Obstetrics and Gynecology

## 2017-09-08 ENCOUNTER — Emergency Department (HOSPITAL_BASED_OUTPATIENT_CLINIC_OR_DEPARTMENT_OTHER): Payer: Medicaid Other

## 2017-09-08 ENCOUNTER — Encounter (HOSPITAL_BASED_OUTPATIENT_CLINIC_OR_DEPARTMENT_OTHER): Payer: Self-pay | Admitting: *Deleted

## 2017-09-08 ENCOUNTER — Emergency Department (HOSPITAL_BASED_OUTPATIENT_CLINIC_OR_DEPARTMENT_OTHER)
Admission: EM | Admit: 2017-09-08 | Discharge: 2017-09-08 | Disposition: A | Discharge status: Home / Self Care | Payer: Medicaid Other | Attending: Emergency Medicine | Admitting: Emergency Medicine

## 2017-09-08 ENCOUNTER — Other Ambulatory Visit: Payer: Self-pay

## 2017-09-08 DIAGNOSIS — R102 Pelvic and perineal pain: Secondary | ICD-10-CM | POA: Diagnosis not present

## 2017-09-08 DIAGNOSIS — Z79899 Other long term (current) drug therapy: Secondary | ICD-10-CM | POA: Diagnosis not present

## 2017-09-08 DIAGNOSIS — R1031 Right lower quadrant pain: Secondary | ICD-10-CM | POA: Diagnosis not present

## 2017-09-08 DIAGNOSIS — J45909 Unspecified asthma, uncomplicated: Secondary | ICD-10-CM | POA: Diagnosis not present

## 2017-09-08 DIAGNOSIS — F17210 Nicotine dependence, cigarettes, uncomplicated: Secondary | ICD-10-CM | POA: Insufficient documentation

## 2017-09-08 LAB — COMPREHENSIVE METABOLIC PANEL
ALBUMIN: 3.7 g/dL (ref 3.5–5.0)
ALT: 14 U/L (ref 14–54)
AST: 16 U/L (ref 15–41)
Alkaline Phosphatase: 71 U/L (ref 38–126)
Anion gap: 9 (ref 5–15)
BILIRUBIN TOTAL: 0.2 mg/dL — AB (ref 0.3–1.2)
BUN: 13 mg/dL (ref 6–20)
CO2: 24 mmol/L (ref 22–32)
Calcium: 8.7 mg/dL — ABNORMAL LOW (ref 8.9–10.3)
Chloride: 104 mmol/L (ref 101–111)
Creatinine, Ser: 0.69 mg/dL (ref 0.44–1.00)
GFR calc Af Amer: >60 mL/min (ref >60)
GFR calc non Af Amer: >60 mL/min (ref >60)
GLUCOSE: 103 mg/dL — AB (ref 65–99)
POTASSIUM: 3.6 mmol/L (ref 3.5–5.1)
Sodium: 137 mmol/L (ref 135–145)
TOTAL PROTEIN: 6.6 g/dL (ref 6.5–8.1)

## 2017-09-08 LAB — CBC WITH DIFFERENTIAL/PLATELET
BASOS ABS: 0 10*3/uL (ref 0.0–0.1)
BASOS PCT: 0 %
Eosinophils Absolute: 0.1 10*3/uL (ref 0.0–0.7)
Eosinophils Relative: 1 %
HEMATOCRIT: 37.5 % (ref 36.0–46.0)
Hemoglobin: 12.5 g/dL (ref 12.0–15.0)
Lymphocytes Relative: 24 %
Lymphs Abs: 2.6 10*3/uL (ref 0.7–4.0)
MCH: 31.6 pg (ref 26.0–34.0)
MCHC: 33.3 g/dL (ref 30.0–36.0)
MCV: 94.9 fL (ref 78.0–100.0)
MONO ABS: 0.9 10*3/uL (ref 0.1–1.0)
Monocytes Relative: 9 %
NEUTROS ABS: 7.1 10*3/uL (ref 1.7–7.7)
Neutrophils Relative %: 66 %
Platelets: 232 10*3/uL (ref 150–400)
RBC: 3.95 MIL/uL (ref 3.87–5.11)
RDW: 14.5 % (ref 11.5–15.5)
WBC: 10.7 10*3/uL — ABNORMAL HIGH (ref 4.0–10.5)

## 2017-09-08 LAB — URINALYSIS, ROUTINE W REFLEX MICROSCOPIC
GLUCOSE, UA: NEGATIVE mg/dL
HGB URINE DIPSTICK: NEGATIVE
Ketones, ur: 15 mg/dL — AB
Leukocytes, UA: NEGATIVE
Nitrite: NEGATIVE
PROTEIN: 30 mg/dL — AB
pH: 6 (ref 5.0–8.0)

## 2017-09-08 LAB — PREGNANCY, URINE: Preg Test, Ur: NEGATIVE

## 2017-09-08 LAB — WET PREP, GENITAL
Clue Cells Wet Prep HPF POC: NONE SEEN
SPERM: NONE SEEN
Trich, Wet Prep: NONE SEEN
Yeast Wet Prep HPF POC: NONE SEEN

## 2017-09-08 LAB — URINALYSIS, MICROSCOPIC (REFLEX)

## 2017-09-08 MED ORDER — HYDROCODONE-ACETAMINOPHEN 5-325 MG PO TABS: 1.0000 | ORAL_TABLET | Freq: Four times a day (QID) | ORAL | 0 refills | Status: DC | PRN

## 2017-09-08 MED ORDER — HYDROMORPHONE HCL 1 MG/ML IJ SOLN
0.5000 mg | Freq: Once | INTRAMUSCULAR | Status: AC
  Administered 2017-09-08: 0.5 mg via INTRAVENOUS
  Filled 2017-09-08: qty 1

## 2017-09-08 MED ORDER — FENTANYL CITRATE (PF) 100 MCG/2ML IJ SOLN
100.0000 ug | Freq: Once | INTRAMUSCULAR | Status: AC
Start: 2017-09-08 — End: 2017-09-08
  Administered 2017-09-08: 100 ug via INTRAVENOUS
  Filled 2017-09-08: qty 2

## 2017-09-08 MED ORDER — ONDANSETRON HCL 4 MG/2ML IJ SOLN
4.0000 mg | Freq: Once | INTRAMUSCULAR | Status: AC
  Administered 2017-09-08: 4 mg via INTRAVENOUS
  Filled 2017-09-08: qty 2

## 2017-09-08 NOTE — ED Provider Notes (Signed)
[facility] EMERGENCY DEPARTMENT Provider Note   CSN: [id number] Arrival date & time: 09/08/17  1403     History   Chief Complaint Chief Complaint  Patient presents with  . Abdominal Pain    HPI Diane Stein is a 32 y.o. female.  Patient with history of ovarian cyst, tachycardia -- status post recent laparoscopy and cystectomy on the left ovary.  Patient presents with acute onset today of right pelvic pain that feels like when she had her left ovarian cyst, just on the other side.  Pain was mild at first and then worsened.  No associated fevers, nausea, vomiting, or diarrhea.  Pain does not radiate.  No constipation or urinary symptoms including dysuria or hematuria.  Patient denies any vaginal bleeding or discharge.  She took ibuprofen approximately 7 AM without improvement.  No other treatments.  Course is constant.  Nothing makes symptoms better or worse.     Past Medical History:  Diagnosis Date  . Anxiety   . Asthma   . Bronchitis   . Depression   . Dysrhythmia    rapid heartrate  . Pneumonia 2014  . Tachycardia 2016    Patient Active Problem List   Diagnosis Date Noted  . Pelvic pain in female 08/28/2017  . Paratubal cyst 08/28/2017  . Chest pain at rest 12/26/2016  . Dizziness 12/26/2016  . Syncope and collapse 08/23/2016  . Depression   . Anxiety   . SVD (spontaneous vaginal delivery) 11/08/2014  . Irregular uterine contractions 11/07/2014  . Palpitations 05/12/2014  . Paroxysmal supraventricular tachycardia (HCC) 05/12/2014  . Pregnancy 05/12/2014    Past Surgical History:  Procedure Laterality Date  . LAPAROSCOPIC UNILATERAL SALPINGECTOMY Left 08/28/2017   Procedure: LAPAROSCOPIC UNILATERAL SALPINGECTOMY WITH REMOVAL OF PARA TUBAL CYST;  Surgeon: Lavina Hamman, MD;  Location: [facility];  Service: Gynecology;  Laterality: Left;  . LAPAROSCOPY Left 08/28/2017   Procedure: LAPAROSCOPY OPERATIVE, FULGERATION OF ENDOMETRIOSIS;  Surgeon:  Lavina Hamman, MD;  Location: [facility] OR[facility];  Service: Gynecology;  Laterality: Left;  . OVARIAN CYST SURGERY    . TOOTH EXTRACTION Right 2008     OB History    Gravida  5   Para  1   Term  1   Preterm      AB  3   Living  1     SAB  3   TAB      Ectopic      Multiple  0   Live Births  1            Home Medications    Prior to Admission medications   Medication Sig Start Date End Date Taking? Authorizing Provider  acetaminophen (TYLENOL) 325 MG tablet Take 650 mg by mouth every 6 (six) hours as needed (for pain or headaches).    [provider]  FLUoxetine (PROZAC) 10 MG capsule Take 30 mg by mouth daily.  08/16/16   [provider]  LORazepam (ATIVAN) 1 MG tablet Take 1 mg by mouth 3 (three) times daily as needed for anxiety.    [provider]  oxyCODONE-acetaminophen (PERCOCET/ROXICET) 5-325 MG tablet Take 1 tablet by mouth every 8 (eight) hours as needed for severe pain. 08/07/17   Khatri, Hina, PA-C  temazepam (RESTORIL) 30 MG capsule Take 30 mg by mouth at bedtime.    [provider]    Family History Family History  Problem Relation Age of Onset  . Hypertension Mother   . Heart disease Father   .  Heart disease Maternal Grandmother   . Hypertension Maternal Grandmother   . Heart disease Maternal Grandfather   . Hypertension Maternal Grandfather   . Heart disease Paternal Grandmother   . Hypertension Paternal Grandmother   . Heart disease Paternal Grandfather   . Hypertension Paternal Grandfather     Social History Social History   Tobacco Use  . Smoking status: Current Every Day Smoker    Packs/day: 0.50    Types: Cigarettes  . Smokeless tobacco: Never Used  Substance Use Topics  . Alcohol use: No    Alcohol/week: 0.0 oz  . Drug use: No     Allergies   Other; Benadryl [diphenhydramine hcl]; and Benadryl [diphenhydramine]   Review of Systems Review of Systems  Constitutional: Negative for fever.    HENT: Negative for rhinorrhea and sore throat.   Eyes: Negative for redness.  Respiratory: Negative for cough.   Cardiovascular: Negative for chest pain.  Gastrointestinal: Positive for abdominal pain. Negative for diarrhea, nausea and vomiting.  Genitourinary: Positive for pelvic pain. Negative for dysuria, frequency, hematuria, vaginal bleeding and vaginal discharge.  Musculoskeletal: Negative for myalgias.  Skin: Negative for rash.  Neurological: Negative for headaches.     Physical Exam Updated Vital Signs BP 113/68   Pulse (!) 105   Temp 98.1 F (36.7 C)   Resp 18   Ht 5\' 3"  (1.6 m)   Wt 66.2 kg (146 lb)   LMP 08/28/2017 Comment: spotting this AM  SpO2 100%   BMI 25.86 kg/m   Physical Exam  Constitutional: She appears well-developed and well-nourished.  HENT:  Head: Normocephalic and atraumatic.  Eyes: Conjunctivae are normal. Right eye exhibits no discharge. Left eye exhibits no discharge.  Neck: Normal range of motion. Neck supple.  Cardiovascular: Normal rate, regular rhythm and normal heart sounds.  Pulmonary/Chest: Effort normal and breath sounds normal.  Abdominal: Soft. There is tenderness (Mild) in the right lower quadrant. There is no rebound, no guarding, no tenderness at McBurney's point and negative Murphy's sign.  Genitourinary: Pelvic exam was performed with patient supine. There is no rash on the right labia. There is no rash on the left labia. Uterus is not tender. Cervix exhibits no motion tenderness, no discharge and no friability. Right adnexum displays tenderness. Right adnexum displays no mass. Left adnexum displays no mass and no tenderness. No vaginal discharge found.  Neurological: She is alert.  Skin: Skin is warm and dry.  Psychiatric: She has a normal mood and affect.  Nursing note and vitals reviewed.    ED Treatments / Results  Labs (all labs ordered are listed, but only abnormal results are displayed) Labs Reviewed  WET PREP, GENITAL  - Abnormal; Notable for the following components:      Result Value   WBC, Wet Prep HPF POC MODERATE (*)    All other components within normal limits  URINALYSIS, ROUTINE W REFLEX MICROSCOPIC - Abnormal; Notable for the following components:   Color, Urine AMBER (*)    APPearance HAZY (*)    Specific Gravity, Urine >1.030 (*)    Bilirubin Urine SMALL (*)    Ketones, ur 15 (*)    Protein, ur 30 (*)    All other components within normal limits  URINALYSIS, MICROSCOPIC (REFLEX) - Abnormal; Notable for the following components:   Bacteria, UA FEW (*)    Squamous Epithelial / LPF 6-30 (*)    All other components within normal limits  CBC WITH DIFFERENTIAL/PLATELET - Abnormal; Notable for the following  components:   WBC 10.7 (*)    All other components within normal limits  COMPREHENSIVE METABOLIC PANEL - Abnormal; Notable for the following components:   Glucose, Bld 103 (*)    Calcium 8.7 (*)    Total Bilirubin 0.2 (*)    All other components within normal limits  PREGNANCY, URINE  GC/CHLAMYDIA PROBE AMP ([place]) NOT AT [facility]    EKG None  Radiology US Pelvic Doppler (torsion R/o Or Mass Arterial Flow)  Result Date: 09/08/2017 CLINICAL DATA:  32 year old G15P1, LMP early March. Right lower quadrant pain. EXAM: TRANSABDOMINAL AND TRANSVAGINAL ULTRASOUND OF PELVIS DOPPLER ULTRASOUND OF OVARIES TECHNIQUE: Both transabdominal and transvaginal ultrasound examinations of the pelvis were performed. Transabdominal technique was performed for global imaging of the pelvis including uterus, ovaries, adnexal regions, and pelvic cul-de-sac. It was necessary to proceed with endovaginal exam following the transabdominal exam to visualize the ovaries. Color and duplex Doppler ultrasound was utilized to evaluate blood flow to the ovaries. COMPARISON:  08/07/2017 FINDINGS: Uterus Measurements: 8.2 x 3.9 x 5.4 cm. Retroflexed. No fibroids or other mass visualized. Endometrium Thickness: 8.6 mm.  No focal  abnormality visualized. Right ovary Measurements: 5.1 x 3.5 x 2.4 cm. Normal appearance/no adnexal mass. Unchanged 2 cm cyst. Left ovary Measurements: 2.0 x 3.3 x 2.3 cm. Normal appearance/no adnexal mass. Pulsed Doppler evaluation of both ovaries demonstrates normal low-resistance arterial and venous waveforms. Other findings Trace pelvic free fluid. IMPRESSION: Normal pelvic ultrasound and Doppler examination. Electronically Signed   By: Deatra Robinson M.D.   On: 09/08/2017 18:50   US Pelvic Complete With Transvaginal  Result Date: 09/08/2017 CLINICAL DATA:  32 year old G78P1, LMP early March. Right lower quadrant pain. EXAM: TRANSABDOMINAL AND TRANSVAGINAL ULTRASOUND OF PELVIS DOPPLER ULTRASOUND OF OVARIES TECHNIQUE: Both transabdominal and transvaginal ultrasound examinations of the pelvis were performed. Transabdominal technique was performed for global imaging of the pelvis including uterus, ovaries, adnexal regions, and pelvic cul-de-sac. It was necessary to proceed with endovaginal exam following the transabdominal exam to visualize the ovaries. Color and duplex Doppler ultrasound was utilized to evaluate blood flow to the ovaries. COMPARISON:  08/07/2017 FINDINGS: Uterus Measurements: 8.2 x 3.9 x 5.4 cm. Retroflexed. No fibroids or other mass visualized. Endometrium Thickness: 8.6 mm.  No focal abnormality visualized. Right ovary Measurements: 5.1 x 3.5 x 2.4 cm. Normal appearance/no adnexal mass. Unchanged 2 cm cyst. Left ovary Measurements: 2.0 x 3.3 x 2.3 cm. Normal appearance/no adnexal mass. Pulsed Doppler evaluation of both ovaries demonstrates normal low-resistance arterial and venous waveforms. Other findings Trace pelvic free fluid. IMPRESSION: Normal pelvic ultrasound and Doppler examination. Electronically Signed   By: Deatra Robinson M.D.   On: 09/08/2017 18:50    Procedures Procedures (including critical care time)  Medications Ordered in ED Medications  HYDROmorphone (DILAUDID)  injection 0.5 mg (0.5 mg Intravenous Given 09/08/17 1538)  ondansetron (ZOFRAN) injection 4 mg (4 mg Intravenous Given 09/08/17 1538)  HYDROmorphone (DILAUDID) injection 0.5 mg (0.5 mg Intravenous Given 09/08/17 1715)  fentaNYL (SUBLIMAZE) injection 100 mcg (100 mcg Intravenous Given 09/08/17 1827)     Initial Impression / Assessment and Plan / ED Course  I have reviewed the triage vital signs and the nursing notes.  Pertinent labs & imaging results that were available during my care of the patient were reviewed by me and considered in my medical decision making (see chart for details).     Patient seen and examined. Work-up initiated. Medications ordered.   Vital signs reviewed and are as follows:  BP 113/68   Pulse (!) 105   Temp 98.1 F (36.7 C)   Resp 18   Ht 5\' 3"  (1.6 m)   Wt 66.2 kg (146 lb)   LMP 08/28/2017 Comment: spotting this AM  SpO2 100%   BMI 25.86 kg/m   5:01 PM patient rechecked.  States that her pain improved with medication for about 30 minutes and then returned.  Additional pain medication ordered.  Will perform pelvic exam and check pelvic ultrasound to evaluate for ovarian cyst and rule out ovarian torsion.  5:30 PM Pelvic exam performed with NT chaperone. Pending [place].   7:31 PM ultrasound completed.  Findings as above.  Patient updated.  At this point, we will discharge home.  Encouraged GYN follow-up for further evaluation.  Reviewed [facility] substance reporting database.  2 previous prescriptions for opioids last month from her GYN doctor.  Patient counseled on use of narcotic pain medications. Counseled not to combine these medications with others containing tylenol. Urged not to drink alcohol, drive, or perform any other activities that requires focus while taking these medications. The patient verbalizes understanding and agrees with the plan.  The patient was urged to return to the Emergency Department immediately with worsening of current  symptoms, worsening abdominal pain, persistent vomiting, blood noted in stools, fever, or any other concerns. The patient verbalized understanding.     Final Clinical Impressions(s) / ED Diagnoses   Final diagnoses:  RLQ abdominal pain  Pelvic pain   Patient with right sided pelvic pain.  Do not suspect PID given exam.  Ultrasound performed showing a 2 cm cyst without signs of torsion.  No other concerning findings.  Labs are otherwise reassuring.  Will treat symptoms and have patient follow-up with her doctor.   ED Discharge Orders        Ordered    HYDROcodone-acetaminophen (NORCO/VICODIN) 5-325 MG tablet  Every 6 hours PRN     09/08/17 1929       Renne Crigler, Cordelia Poche 09/08/17 1932    Arby Barrette, MD 09/12/17 [phone]

## 2017-09-08 NOTE — Discharge Instructions (Signed)
Please read and follow all provided instructions.  Your diagnoses today include:  1. Pelvic pain   2. RLQ abdominal pain     Tests performed today include:  Blood counts and electrolytes  Blood tests to check liver and kidney function  Urine test to look for infection and pregnancy (in women)  Ultrasound -shows 2 cm cyst on the right side, good blood flow to the ovaries  Vital signs. See below for your results today.   Medications prescribed:   Vicodin (hydrocodone/acetaminophen) - narcotic pain medication  DO NOT drive or perform any activities that require you to be awake and alert because this medicine can make you drowsy. BE VERY CAREFUL not to take multiple medicines containing Tylenol (also called acetaminophen). Doing so can lead to an overdose which can damage your liver and cause liver failure and possibly death.  Take any prescribed medications only as directed.  Home care instructions:   Follow any educational materials contained in this packet.  Follow-up instructions: Please follow-up with your GYN in the next 5 days for further evaluation of your symptoms.    Return instructions:  SEEK IMMEDIATE MEDICAL ATTENTION IF:  The pain does not go away or becomes severe   A temperature above 101F develops   Repeated vomiting occurs (multiple episodes)   The pain becomes localized to portions of the abdomen. The right side could possibly be appendicitis. In an adult, the left lower portion of the abdomen could be colitis or diverticulitis.   Blood is being passed in stools or vomit (bright red or black tarry stools)   You develop chest pain, difficulty breathing, dizziness or fainting, or become confused, poorly responsive, or inconsolable (young children)  If you have any other emergent concerns regarding your health  Additional Information: Abdominal (belly) pain can be caused by many things. Your caregiver performed an examination and possibly ordered  blood/urine tests and imaging (CT scan, x-rays, ultrasound). Many cases can be observed and treated at home after initial evaluation in the emergency department. Even though you are being discharged home, abdominal pain can be unpredictable. Therefore, you need a repeated exam if your pain does not resolve, returns, or worsens. Most patients with abdominal pain don't have to be admitted to the hospital or have surgery, but serious problems like appendicitis and gallbladder attacks can start out as nonspecific pain. Many abdominal conditions cannot be diagnosed in one visit, so follow-up evaluations are very important.  Your vital signs today were: BP 120/79 (BP Location: Left Arm)    Pulse 99    Temp 97.8 F (36.6 C) (Oral)    Resp 18    Ht 5\' 3"  (1.6 m)    Wt 66.2 kg (146 lb)    LMP 08/28/2017 Comment: spotting this AM   SpO2 100%    BMI 25.86 kg/m  If your blood pressure (bp) was elevated above 135/85 this visit, please have this repeated by your doctor within one month. --------------

## 2017-09-08 NOTE — ED Triage Notes (Signed)
Pt c/o right lower abd pain x 1 day, recent ovary cyst removal x 2 weeks ago

## 2017-09-09 LAB — GC/CHLAMYDIA PROBE AMP (~~LOC~~) NOT AT ARMC
CHLAMYDIA, DNA PROBE: NEGATIVE
NEISSERIA GONORRHEA: NEGATIVE

## 2017-10-30 ENCOUNTER — Emergency Department (HOSPITAL_BASED_OUTPATIENT_CLINIC_OR_DEPARTMENT_OTHER): Payer: Medicaid Other

## 2017-10-30 ENCOUNTER — Other Ambulatory Visit: Payer: Self-pay

## 2017-10-30 ENCOUNTER — Emergency Department (HOSPITAL_BASED_OUTPATIENT_CLINIC_OR_DEPARTMENT_OTHER)
Admission: EM | Admit: 2017-10-30 | Discharge: 2017-10-30 | Disposition: A | Discharge status: Home / Self Care | Payer: Medicaid Other | Attending: Emergency Medicine | Admitting: Emergency Medicine

## 2017-10-30 ENCOUNTER — Encounter (HOSPITAL_BASED_OUTPATIENT_CLINIC_OR_DEPARTMENT_OTHER): Payer: Self-pay | Admitting: *Deleted

## 2017-10-30 DIAGNOSIS — T7840XA Allergy, unspecified, initial encounter: Secondary | ICD-10-CM | POA: Diagnosis present

## 2017-10-30 DIAGNOSIS — J45909 Unspecified asthma, uncomplicated: Secondary | ICD-10-CM | POA: Diagnosis not present

## 2017-10-30 DIAGNOSIS — F17210 Nicotine dependence, cigarettes, uncomplicated: Secondary | ICD-10-CM | POA: Insufficient documentation

## 2017-10-30 DIAGNOSIS — Z79899 Other long term (current) drug therapy: Secondary | ICD-10-CM | POA: Insufficient documentation

## 2017-10-30 LAB — CBC WITH DIFFERENTIAL/PLATELET
BASOS ABS: 0 10*3/uL (ref 0.0–0.1)
BASOS PCT: 0 %
Eosinophils Absolute: 0.1 10*3/uL (ref 0.0–0.7)
Eosinophils Relative: 1 %
HCT: 40.6 % (ref 36.0–46.0)
HEMOGLOBIN: 13.3 g/dL (ref 12.0–15.0)
Lymphocytes Relative: 23 %
Lymphs Abs: 1.6 10*3/uL (ref 0.7–4.0)
MCH: 30.9 pg (ref 26.0–34.0)
MCHC: 32.8 g/dL (ref 30.0–36.0)
MCV: 94.2 fL (ref 78.0–100.0)
MONOS PCT: 7 %
Monocytes Absolute: 0.5 10*3/uL (ref 0.1–1.0)
NEUTROS ABS: 4.8 10*3/uL (ref 1.7–7.7)
NEUTROS PCT: 69 %
Platelets: 265 10*3/uL (ref 150–400)
RBC: 4.31 MIL/uL (ref 3.87–5.11)
RDW: 14.7 % (ref 11.5–15.5)
WBC: 7 10*3/uL (ref 4.0–10.5)

## 2017-10-30 LAB — BASIC METABOLIC PANEL
ANION GAP: 7 (ref 5–15)
BUN: 12 mg/dL (ref 6–20)
CALCIUM: 8.5 mg/dL — AB (ref 8.9–10.3)
CO2: 25 mmol/L (ref 22–32)
Chloride: 106 mmol/L (ref 101–111)
Creatinine, Ser: 0.72 mg/dL (ref 0.44–1.00)
GFR calc non Af Amer: >60 mL/min (ref >60)
Glucose, Bld: 127 mg/dL — ABNORMAL HIGH (ref 65–99)
POTASSIUM: 3.9 mmol/L (ref 3.5–5.1)
Sodium: 138 mmol/L (ref 135–145)

## 2017-10-30 LAB — TROPONIN I: Troponin I: <0.03 ng/mL (ref <0.03)

## 2017-10-30 MED ORDER — ACETAMINOPHEN 325 MG PO TABS
650.0000 mg | ORAL_TABLET | Freq: Once | ORAL | Status: AC
  Administered 2017-10-30: 650 mg via ORAL
  Filled 2017-10-30: qty 2

## 2017-10-30 MED ORDER — FAMOTIDINE 20 MG PO TABS: 20.0000 mg | ORAL_TABLET | Freq: Two times a day (BID) | ORAL | 0 refills | Status: DC

## 2017-10-30 MED ORDER — FAMOTIDINE IN NACL 20-0.9 MG/50ML-% IV SOLN
20.0000 mg | Freq: Once | INTRAVENOUS | Status: AC
  Administered 2017-10-30: 20 mg via INTRAVENOUS
  Filled 2017-10-30: qty 50

## 2017-10-30 MED ORDER — SODIUM CHLORIDE 0.9 % IV BOLUS
1000.0000 mL | Freq: Once | INTRAVENOUS | Status: AC
  Administered 2017-10-30: 1000 mL via INTRAVENOUS

## 2017-10-30 MED ORDER — METHYLPREDNISOLONE SODIUM SUCC 125 MG IJ SOLR
125.0000 mg | Freq: Once | INTRAMUSCULAR | Status: AC
Start: 2017-10-30 — End: 2017-10-30
  Administered 2017-10-30: 125 mg via INTRAVENOUS
  Filled 2017-10-30: qty 2

## 2017-10-30 MED ORDER — PREDNISONE 10 MG PO TABS: 40.0000 mg | ORAL_TABLET | Freq: Every day | ORAL | 0 refills | Status: DC

## 2017-10-30 MED ORDER — CETIRIZINE HCL 5 MG/5ML PO SOLN
10.0000 mg | Freq: Once | ORAL | Status: AC
  Administered 2017-10-30: 10 mg via ORAL
  Filled 2017-10-30: qty 10

## 2017-10-30 NOTE — Discharge Instructions (Addendum)
You can take Zyrtec, available over the counter, once daily for itching.

## 2017-10-30 NOTE — ED Triage Notes (Signed)
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  Gave herself a shot of Epi around 8:30 am PTA.

## 2017-10-30 NOTE — ED Notes (Signed)
ED Provider at bedside. 

## 2017-10-30 NOTE — ED Notes (Signed)
C/o numbness to fingers verbalized.

## 2017-10-30 NOTE — ED Provider Notes (Signed)
[facility] EMERGENCY DEPARTMENT Provider Note   CSN: [id number] Arrival date & time: 10/30/17  4098     History   Chief Complaint Chief Complaint  Patient presents with  . Allergic Reaction    HPI Diane Stein is a 32 y.o. female.  The history is provided by the patient. No language interpreter was used.  Allergic Reaction    Diane Stein is a 32 y.o. female who presents to the Emergency Department complaining of allergic reaction. She presents for evaluation of allergic reaction after exposure to avocado oil. At 815 this morning she got avocado oil on her hands and developed immediate itching to bilateral hands. She washed her hands immediately with soap and water. At 830 she gave herself an EpiPen at home due to diffuse itching throughout her body. Now she complains of severe central chest pain radiating to her back that began just upon ED arrival. She denies any shortness of breath, throat swelling. She does feel swollen in her hands.  Past Medical History:  Diagnosis Date  . Anxiety   . Asthma   . Bronchitis   . Depression   . Dysrhythmia    rapid heartrate  . Pneumonia 2014  . Tachycardia 2016    Patient Active Problem List   Diagnosis Date Noted  . Pelvic pain in female 08/28/2017  . Paratubal cyst 08/28/2017  . Chest pain at rest 12/26/2016  . Dizziness 12/26/2016  . Syncope and collapse 08/23/2016  . Depression   . Anxiety   . SVD (spontaneous vaginal delivery) 11/08/2014  . Irregular uterine contractions 11/07/2014  . Palpitations 05/12/2014  . Paroxysmal supraventricular tachycardia (HCC) 05/12/2014  . Pregnancy 05/12/2014    Past Surgical History:  Procedure Laterality Date  . LAPAROSCOPIC UNILATERAL SALPINGECTOMY Left 08/28/2017   Procedure: LAPAROSCOPIC UNILATERAL SALPINGECTOMY WITH REMOVAL OF PARA TUBAL CYST;  Surgeon: Lavina Hamman, MD;  Location: [facility];  Service: Gynecology;  Laterality: Left;  . LAPAROSCOPY Left 08/28/2017     Procedure: LAPAROSCOPY OPERATIVE, FULGERATION OF ENDOMETRIOSIS;  Surgeon: Lavina Hamman, MD;  Location: [facility];  Service: Gynecology;  Laterality: Left;  . OVARIAN CYST SURGERY    . TOOTH EXTRACTION Right 2008     OB History    Gravida  5   Para  1   Term  1   Preterm      AB  3   Living  1     SAB  3   TAB      Ectopic      Multiple  0   Live Births  1            Home Medications    Prior to Admission medications   Medication Sig Start Date End Date Taking? Authorizing Provider  FLUoxetine (PROZAC) 10 MG capsule Take 30 mg by mouth daily.  08/16/16  Yes [provider]  temazepam (RESTORIL) 30 MG capsule Take 30 mg by mouth at bedtime.   Yes [provider]  acetaminophen (TYLENOL) 325 MG tablet Take 650 mg by mouth every 6 (six) hours as needed (for pain or headaches).    [provider]  famotidine (PEPCID) 20 MG tablet Take 1 tablet (20 mg total) by mouth 2 (two) times daily. 10/30/17   Tilden Fossa, MD  HYDROcodone-acetaminophen (NORCO/VICODIN) 5-325 MG tablet Take 1 tablet by mouth every 6 (six) hours as needed. 09/08/17   Renne Crigler, PA-C  LORazepam (ATIVAN) 1 MG tablet Take 1 mg by mouth 3 (three) times  daily as needed for anxiety.    [provider]  oxyCODONE-acetaminophen (PERCOCET/ROXICET) 5-325 MG tablet Take 1 tablet by mouth every 8 (eight) hours as needed for severe pain. 08/07/17   Khatri, Hina, PA-C  predniSONE (DELTASONE) 10 MG tablet Take 4 tablets (40 mg total) by mouth daily. 10/30/17   Tilden Fossaees, [place], MD    Family History Family History  Problem Relation Age of Onset  . Hypertension Mother   . Heart disease Father   . Heart disease Maternal Grandmother   . Hypertension Maternal Grandmother   . Heart disease Maternal Grandfather   . Hypertension Maternal Grandfather   . Heart disease Paternal Grandmother   . Hypertension Paternal Grandmother   . Heart disease Paternal Grandfather   .  Hypertension Paternal Grandfather     Social History Social History   Tobacco Use  . Smoking status: Current Every Day Smoker    Packs/day: 0.50    Types: Cigarettes  . Smokeless tobacco: Never Used  Substance Use Topics  . Alcohol use: No    Alcohol/week: 0.0 oz  . Drug use: No     Allergies   Avocado; Other; Benadryl [diphenhydramine hcl]; and Benadryl [diphenhydramine]   Review of Systems Review of Systems  All other systems reviewed and are negative.    Physical Exam Updated Vital Signs BP 112/70   Pulse 73   Temp 98.6 F (37 C) (Oral)   Resp (!) 21   Ht 5\' 3"  (1.6 m)   Wt 68 kg (150 lb)   SpO2 100%   BMI 26.57 kg/m   Physical Exam  Constitutional: She is oriented to person, place, and time. She appears well-developed and well-nourished.  HENT:  Head: Normocephalic and atraumatic.  Mouth/Throat: Oropharynx is clear and moist.  Cardiovascular: Normal rate and regular rhythm.  No murmur heard. Pulmonary/Chest: Effort normal and breath sounds normal. No stridor. No respiratory distress.  Abdominal: Soft. There is no tenderness. There is no rebound and no guarding.  Musculoskeletal: She exhibits no edema or tenderness.  Neurological: She is alert and oriented to person, place, and time.  Skin: Skin is warm and dry.  Erythema of face, chest and back (patient was in a tanning bed yesterday but thinks the redness is new today).  Psychiatric: She has a normal mood and affect. Her behavior is normal.  Nursing note and vitals reviewed.    ED Treatments / Results  Labs (all labs ordered are listed, but only abnormal results are displayed) Labs Reviewed  BASIC METABOLIC PANEL - Abnormal; Notable for the following components:      Result Value   Glucose, Bld 127 (*)    Calcium 8.5 (*)    All other components within normal limits  TROPONIN I  CBC WITH DIFFERENTIAL/PLATELET    EKG EKG Interpretation  Date/Time:  Friday October 30 2017 10:10:55  EDT Ventricular Rate:  82 PR Interval:    QRS Duration: 89 QT Interval:  385 QTC Calculation: 450 R Axis:   84 Text Interpretation:  Sinus rhythm Confirmed by Tilden Fossaees, Inna [phone]([id number]) on 10/30/2017 10:29:36 AM   Radiology Dg Chest 2 View  Result Date: 10/30/2017 CLINICAL DATA:  Chest pain EXAM: CHEST - 2 VIEW COMPARISON:  11/14/2016 FINDINGS: Normal heart size and mediastinal contours. No acute infiltrate or edema. No effusion or pneumothorax. No acute osseous findings. IMPRESSION: Negative chest. Electronically Signed   By: Marnee SpringJonathon  Watts M.D.   On: 10/30/2017 11:37    Procedures Procedures (including critical care time)  Medications Ordered in ED Medications  famotidine (PEPCID) IVPB 20 mg premix (0 mg Intravenous Stopped 10/30/17 1123)  methylPREDNISolone sodium succinate (SOLU-MEDROL) 125 mg/2 mL injection 125 mg (125 mg Intravenous Given 10/30/17 1003)  cetirizine HCl (Zyrtec) 5 MG/5ML solution 10 mg (10 mg Oral Given 10/30/17 1002)  sodium chloride 0.9 % bolus 1,000 mL (0 mLs Intravenous Stopped 10/30/17 1123)  acetaminophen (TYLENOL) tablet 650 mg (650 mg Oral Given 10/30/17 1123)     Initial Impression / Assessment and Plan / ED Course  I have reviewed the triage vital signs and the nursing notes.  Pertinent labs & imaging results that were available during my care of the patient were reviewed by me and considered in my medical decision making (see chart for details).     Patient here for evaluation of itching, chest pain after having exposure to avocado oil. She did self administer epinephrine prior to ED arrival. She did have a erythroderma on ED arrival but no additional features concerning for anaphylaxis. She was observed for several hours with resolution of her or through derma and improvement in her symptoms. Discussed with patient home care for anaphylaxis. Will prescribed prednisone and Pepcid as well as Zyrtec. She does have refills of her epi-pen available at home. Discussed  outpatient follow-up and return precautions.  Final Clinical Impressions(s) / ED Diagnoses   Final diagnoses:  Allergic reaction, initial encounter    ED Discharge Orders        Ordered    famotidine (PEPCID) 20 MG tablet  2 times daily     10/30/17 1240    predniSONE (DELTASONE) 10 MG tablet  Daily     10/30/17 1240       Tilden Fossa, MD 10/30/17 1243

## 2017-11-07 ENCOUNTER — Emergency Department (HOSPITAL_BASED_OUTPATIENT_CLINIC_OR_DEPARTMENT_OTHER)
Admission: EM | Admit: 2017-11-07 | Discharge: 2017-11-07 | Disposition: A | Discharge status: Home / Self Care | Payer: Medicaid Other | Attending: Emergency Medicine | Admitting: Emergency Medicine

## 2017-11-07 ENCOUNTER — Encounter (HOSPITAL_BASED_OUTPATIENT_CLINIC_OR_DEPARTMENT_OTHER): Payer: Self-pay | Admitting: Emergency Medicine

## 2017-11-07 ENCOUNTER — Other Ambulatory Visit: Payer: Self-pay

## 2017-11-07 DIAGNOSIS — T782XXA Anaphylactic shock, unspecified, initial encounter: Secondary | ICD-10-CM

## 2017-11-07 DIAGNOSIS — F17210 Nicotine dependence, cigarettes, uncomplicated: Secondary | ICD-10-CM | POA: Diagnosis not present

## 2017-11-07 DIAGNOSIS — J45909 Unspecified asthma, uncomplicated: Secondary | ICD-10-CM | POA: Insufficient documentation

## 2017-11-07 DIAGNOSIS — T7800XA Anaphylactic reaction due to unspecified food, initial encounter: Secondary | ICD-10-CM | POA: Diagnosis not present

## 2017-11-07 DIAGNOSIS — Z79899 Other long term (current) drug therapy: Secondary | ICD-10-CM | POA: Diagnosis not present

## 2017-11-07 DIAGNOSIS — L509 Urticaria, unspecified: Secondary | ICD-10-CM | POA: Diagnosis present

## 2017-11-07 MED ORDER — DIPHENHYDRAMINE HCL 50 MG/ML IJ SOLN
25.0000 mg | Freq: Once | INTRAMUSCULAR | Status: AC
  Administered 2017-11-07: 25 mg via INTRAVENOUS

## 2017-11-07 MED ORDER — EPINEPHRINE 0.3 MG/0.3ML IJ SOAJ: 0.3000 mg | Freq: Once | INTRAMUSCULAR | 2 refills | Status: AC

## 2017-11-07 MED ORDER — METHYLPREDNISOLONE SODIUM SUCC 125 MG IJ SOLR
125.0000 mg | Freq: Once | INTRAMUSCULAR | Status: AC
  Administered 2017-11-07: 125 mg via INTRAVENOUS

## 2017-11-07 MED ORDER — DIPHENHYDRAMINE HCL 50 MG/ML IJ SOLN
INTRAMUSCULAR | Status: AC
  Administered 2017-11-07: 25 mg via INTRAVENOUS
  Filled 2017-11-07: qty 1

## 2017-11-07 MED ORDER — FAMOTIDINE IN NACL 20-0.9 MG/50ML-% IV SOLN
20.0000 mg | Freq: Once | INTRAVENOUS | Status: AC
  Administered 2017-11-07: 20 mg via INTRAVENOUS

## 2017-11-07 MED ORDER — FAMOTIDINE IN NACL 20-0.9 MG/50ML-% IV SOLN
INTRAVENOUS | Status: AC
  Administered 2017-11-07: 20 mg via INTRAVENOUS
  Filled 2017-11-07: qty 50

## 2017-11-07 MED ORDER — METHYLPREDNISOLONE SODIUM SUCC 125 MG IJ SOLR
INTRAMUSCULAR | Status: AC
  Administered 2017-11-07: 125 mg via INTRAVENOUS
  Filled 2017-11-07: qty 2

## 2017-11-07 NOTE — Discharge Instructions (Signed)
We have provided you with a refill for your EpiPen.  Return to the emergency room if you have recurrent hives, swelling, shortness of breath, vomiting.  Use your EpiPen if you experience any of these symptoms and immediately come to the emergency room.

## 2017-11-07 NOTE — ED Triage Notes (Signed)
Allergic reaction after coming in contact with avocado. She used her epi pen. Denies SOB at this time, c/o hives and itching.

## 2017-11-07 NOTE — ED Provider Notes (Signed)
[facility] EMERGENCY DEPARTMENT Provider Note   CSN: [id number] Arrival date & time: 11/07/17  1107     History   Chief Complaint Chief Complaint  Patient presents with  . Allergic Reaction    HPI Diane Stein is a 32 y.o. female.  HPI  Patient presents today with hives, use of her epipen immediately prior to arrival. She works at [employer] and was [employer] when one jar was broken and the [place] got onto her hands. She immediately felt tingling, went to the bathroom and washed up to her elbows. She also felt SOB and had hives. Ran and administered her epipen, and came in.  She had a similar experience with occupational exposure to avocado about 1 week ago and was seen in this department.  She states she has not seen an allergist, gets EpiPen refills from this department.  No other allergies that she knows of.  She denies nausea or vomiting. Past Medical History:  Diagnosis Date  . Anxiety   . Asthma   . Bronchitis   . Depression   . Dysrhythmia    rapid heartrate  . Pneumonia 2014  . Tachycardia 2016    Patient Active Problem List   Diagnosis Date Noted  . Pelvic pain in female 08/28/2017  . Paratubal cyst 08/28/2017  . Chest pain at rest 12/26/2016  . Dizziness 12/26/2016  . Syncope and collapse 08/23/2016  . Depression   . Anxiety   . SVD (spontaneous vaginal delivery) 11/08/2014  . Irregular uterine contractions 11/07/2014  . Palpitations 05/12/2014  . Paroxysmal supraventricular tachycardia (HCC) 05/12/2014  . Pregnancy 05/12/2014    Past Surgical History:  Procedure Laterality Date  . LAPAROSCOPIC UNILATERAL SALPINGECTOMY Left 08/28/2017   Procedure: LAPAROSCOPIC UNILATERAL SALPINGECTOMY WITH REMOVAL OF PARA TUBAL CYST;  Surgeon: Lavina Hamman, MD;  Location: [facility];  Service: Gynecology;  Laterality: Left;  . LAPAROSCOPY Left 08/28/2017   Procedure: LAPAROSCOPY OPERATIVE, FULGERATION OF ENDOMETRIOSIS;  Surgeon: Lavina Hamman, MD;  Location: [facility];  Service: Gynecology;  Laterality: Left;  . OVARIAN CYST SURGERY    . TOOTH EXTRACTION Right 2008     OB History    Gravida  5   Para  1   Term  1   Preterm      AB  3   Living  1     SAB  3   TAB      Ectopic      Multiple  0   Live Births  1            Home Medications    Prior to Admission medications   Medication Sig Start Date End Date Taking? Authorizing Provider  acetaminophen (TYLENOL) 325 MG tablet Take 650 mg by mouth every 6 (six) hours as needed (for pain or headaches).    [provider]  EPINEPHrine 0.3 mg/0.3 mL IJ SOAJ injection Inject 0.3 mLs (0.3 mg total) into the muscle once for 1 dose. 11/07/17 11/07/17  Garth Bigness, MD  famotidine (PEPCID) 20 MG tablet Take 1 tablet (20 mg total) by mouth 2 (two) times daily. 10/30/17   Tilden Fossa, MD  FLUoxetine (PROZAC) 10 MG capsule Take 30 mg by mouth daily.  08/16/16   [provider]  HYDROcodone-acetaminophen (NORCO/VICODIN) 5-325 MG tablet Take 1 tablet by mouth every 6 (six) hours as needed. 09/08/17   Renne Crigler, PA-C  LORazepam (ATIVAN) 1 MG tablet Take 1 mg by mouth 3 (three) times daily  as needed for anxiety.    [provider]  oxyCODONE-acetaminophen (PERCOCET/ROXICET) 5-325 MG tablet Take 1 tablet by mouth every 8 (eight) hours as needed for severe pain. 08/07/17   Khatri, Hina, PA-C  predniSONE (DELTASONE) 10 MG tablet Take 4 tablets (40 mg total) by mouth daily. 10/30/17   Tilden Fossa, MD  temazepam (RESTORIL) 30 MG capsule Take 30 mg by mouth at bedtime.    [provider]    Family History Family History  Problem Relation Age of Onset  . Hypertension Mother   . Heart disease Father   . Heart disease Maternal Grandmother   . Hypertension Maternal Grandmother   . Heart disease Maternal Grandfather   . Hypertension Maternal Grandfather   . Heart disease Paternal Grandmother   . Hypertension Paternal  Grandmother   . Heart disease Paternal Grandfather   . Hypertension Paternal Grandfather     Social History Social History   Tobacco Use  . Smoking status: Current Every Day Smoker    Packs/day: 0.50    Types: Cigarettes  . Smokeless tobacco: Never Used  Substance Use Topics  . Alcohol use: No  . Drug use: No     Allergies   Avocado; Other; Benadryl [diphenhydramine hcl]; and Benadryl [diphenhydramine]   Review of Systems Review of Systems  Constitutional: Negative for activity change, appetite change and fever.  HENT: Negative for drooling and trouble swallowing.   Respiratory: Positive for shortness of breath. Negative for chest tightness.   Cardiovascular: Negative for chest pain.  Gastrointestinal: Negative for abdominal pain, nausea and vomiting.     Physical Exam Updated Vital Signs BP 126/66   Pulse 95   Temp 98.3 F (36.8 C) (Oral)   Resp 18   Ht 5\' 3"  (1.6 m)   Wt 68 kg (150 lb)   SpO2 98%   BMI 26.57 kg/m   Physical Exam  Constitutional: She is oriented to person, place, and time. She appears well-developed and well-nourished. No distress.  HENT:  Head: Normocephalic.  Eyes: Conjunctivae and EOM are normal.  Neck: Normal range of motion.  Cardiovascular: Regular rhythm.  No murmur heard. Tachycardic.  Pulmonary/Chest: Effort normal and breath sounds normal. She has no wheezes.  Abdominal: Soft. Bowel sounds are normal. There is no tenderness.  Musculoskeletal: Normal range of motion.  Neurological: She is alert and oriented to person, place, and time.  Skin: Skin is warm. Capillary refill takes less than 2 seconds.  Diffuse erythroderma across chest and abdomen.   Psychiatric: She has a normal mood and affect.     ED Treatments / Results  Labs (all labs ordered are listed, but only abnormal results are displayed) Labs Reviewed - No data to display  EKG None  Radiology No results found.  Procedures Procedures (including critical  care time)  Medications Ordered in ED Medications  diphenhydrAMINE (BENADRYL) injection 25 mg (25 mg Intravenous Given 11/07/17 1129)  famotidine (PEPCID) IVPB 20 mg premix (0 mg Intravenous Stopped 11/07/17 1249)  methylPREDNISolone sodium succinate (SOLU-MEDROL) 125 mg/2 mL injection 125 mg (125 mg Intravenous Given 11/07/17 1129)     Initial Impression / Assessment and Plan / ED Course  I have reviewed the triage vital signs and the nursing notes.  Pertinent labs & imaging results that were available during my care of the patient were reviewed by me and considered in my medical decision making (see chart for details).     Status post use of epipen for exposure to known allergen  immediately prior to arrival. Normal WOB without multisystem involvement on arrival. Given solumedrol, pepcid, and benadryl on arrival. Monitored for >2 hours without return of allergy symptoms. Refilled epipen and excused her from work x 1 day.  Final Clinical Impressions(s) / ED Diagnoses   Final diagnoses:  Anaphylaxis, initial encounter    ED Discharge Orders        Ordered    EPINEPHrine 0.3 mg/0.3 mL IJ SOAJ injection   Once     11/07/17 1256     Loni MuseKate Jashanti Clinkscale, MD PGY 2 FM   Garth Bignessimberlake, Khari Lett, MD 11/07/17 1430    Rolland PorterJames, Mark, MD 11/14/17 [phone]

## 2017-11-07 NOTE — ED Notes (Signed)
Took epi pen about an hour ago - reports that she feels jittery but not SOb at this time. Also reports itching all over

## 2017-12-19 IMAGING — CR DG CHEST 2V
2 series · 2 of 2 positions shown · non-contrast
Comparison: 08/19/2016

CLINICAL DATA: Cp. Pt has been having left sided chest pain for the
last 3-4 hours. She's also been feeling dizzy and weak, and
mentioned her left arm felt funny earlier. She said she did vomit
before her cp started. Smoker of 7 years, she said she's been an off
again on again smoker during that time. Hx of asthma, anxiety,
bronchitis, tachycardia

EXAM:
CHEST  2 VIEW

[chest pa]
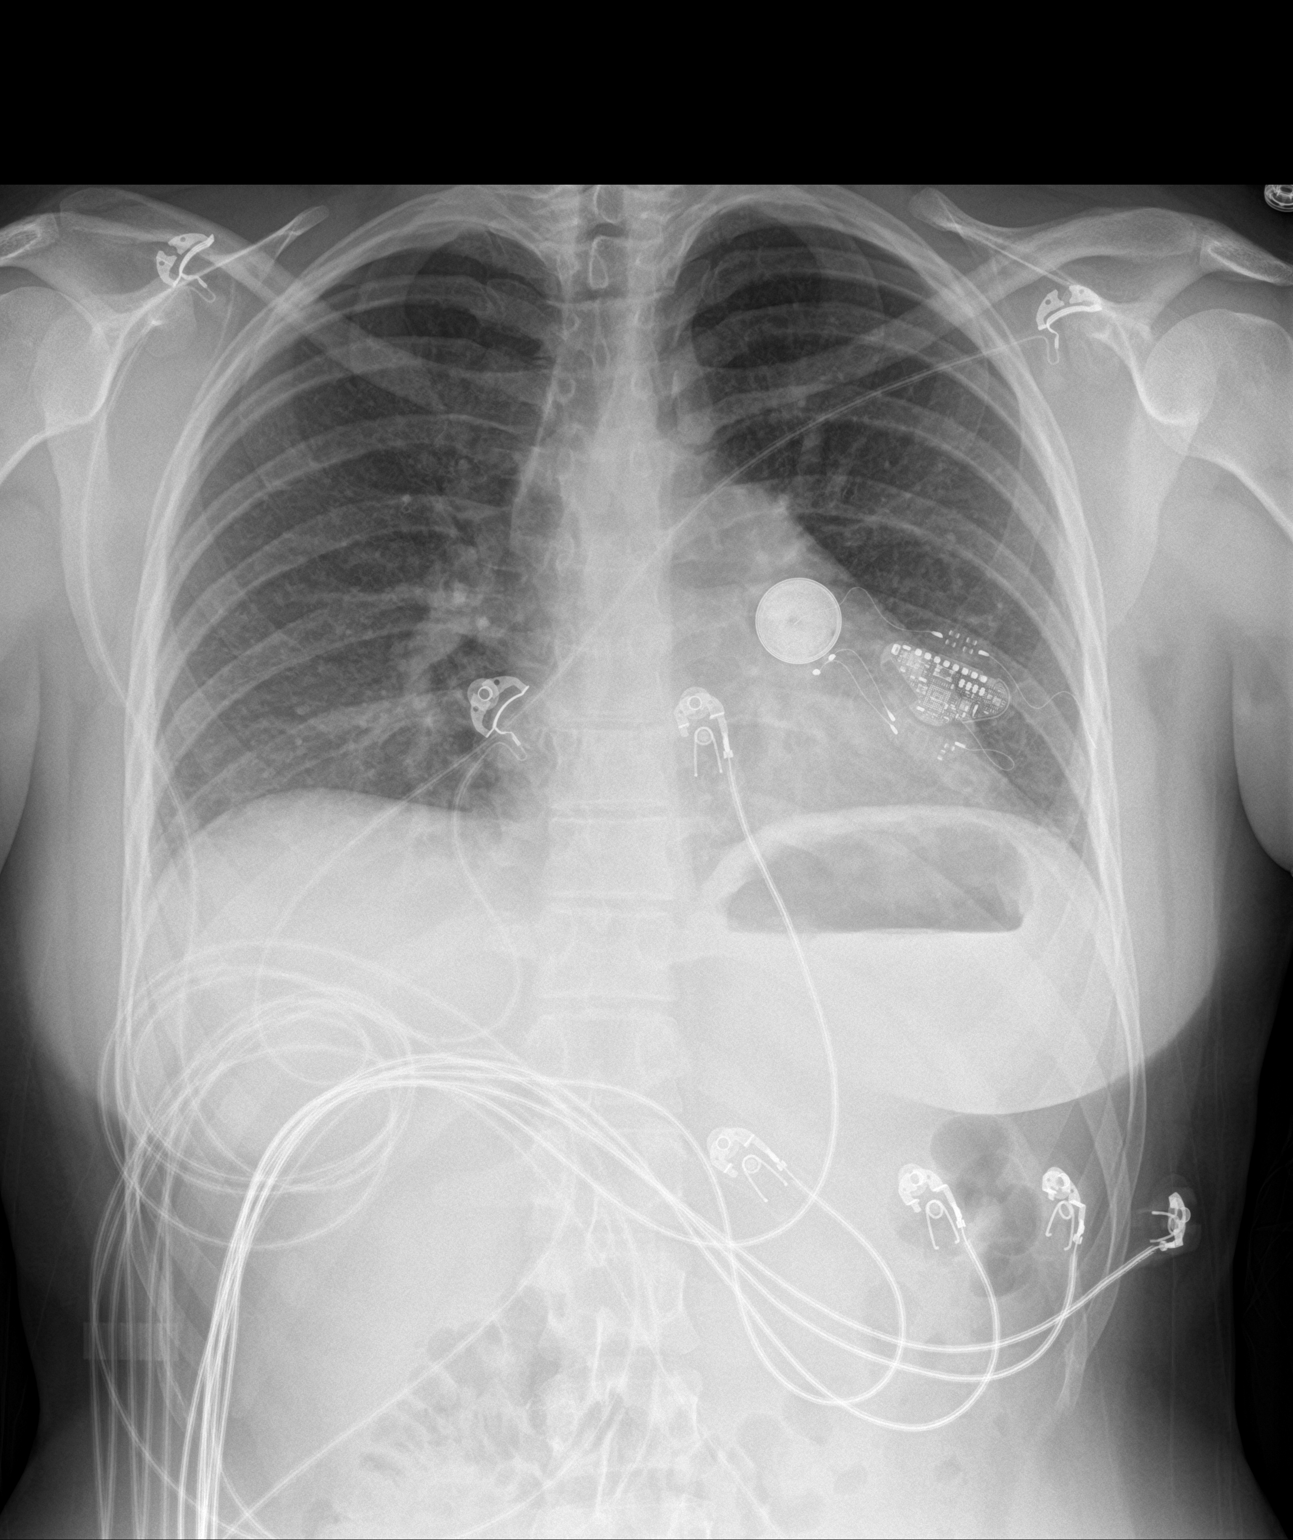

[chest lat]
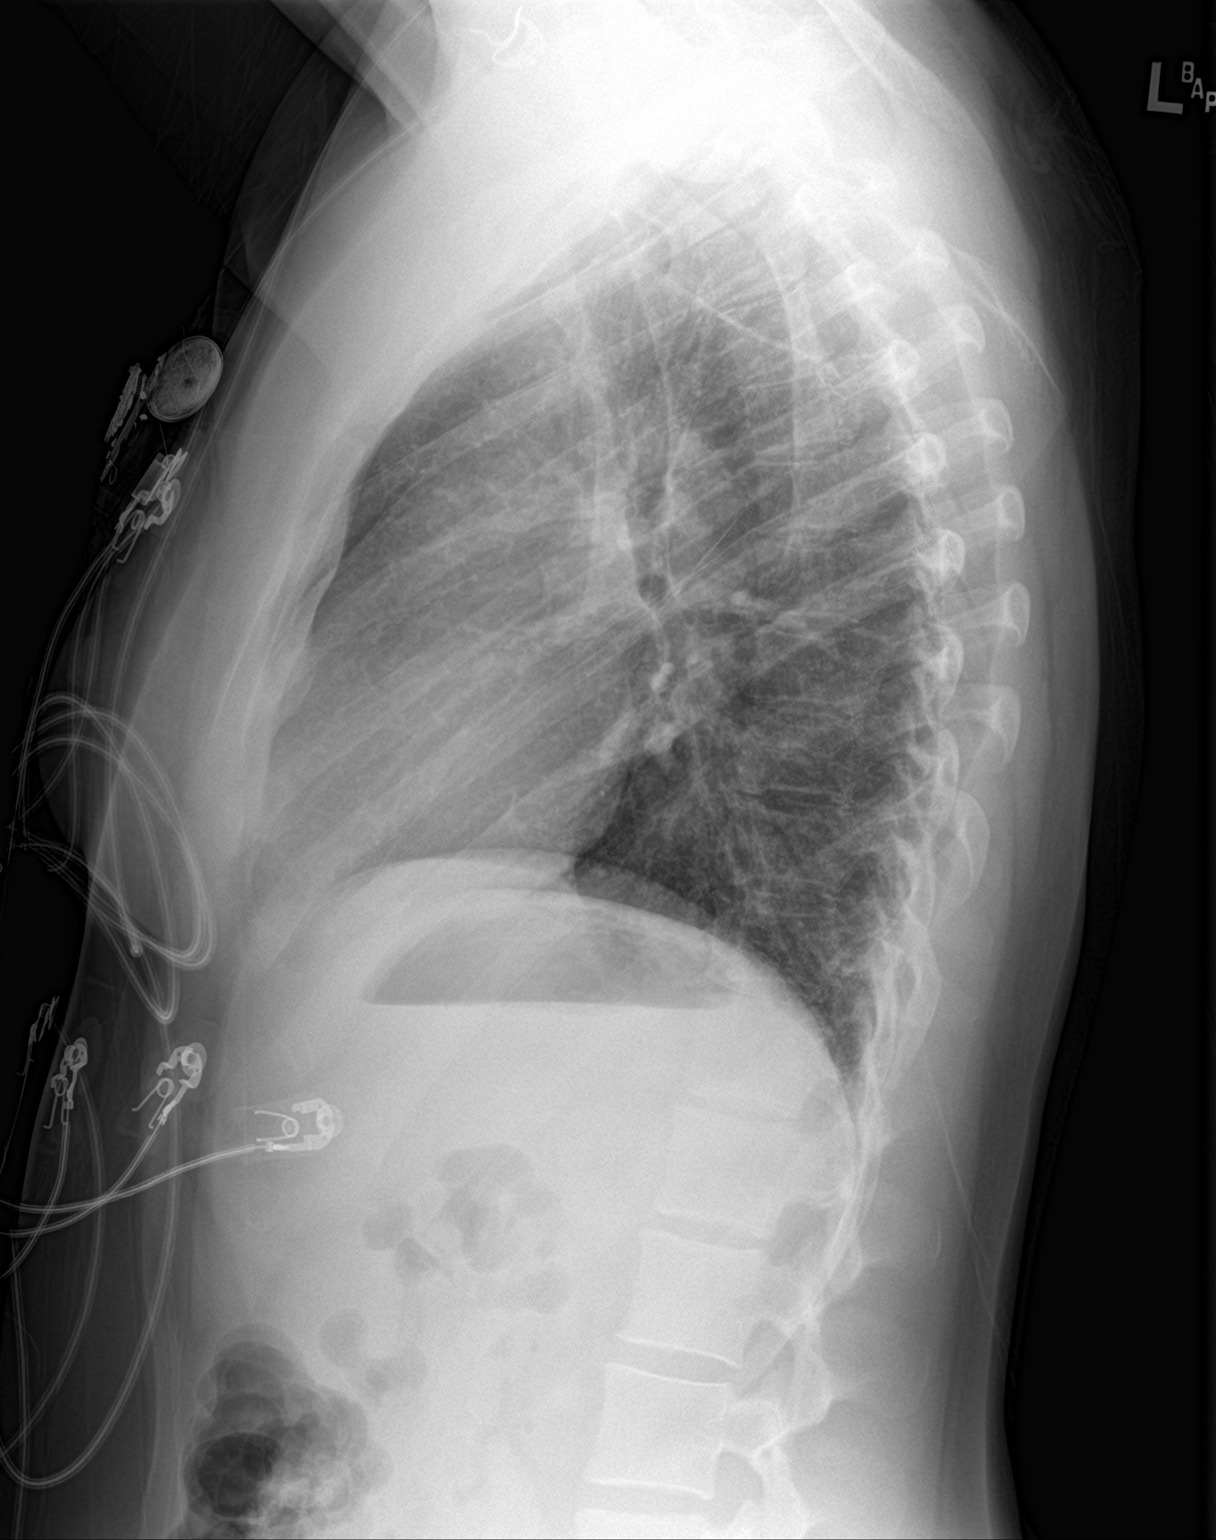

[2 of 2 positions shown; findings below may reference images not displayed]

FINDINGS: The cardiac silhouette is normal in size and configuration. Normal
mediastinal and hilar contours.

Clear lungs.  No pleural effusion or pneumothorax.

Skeletal structures are unremarkable.
IMPRESSION: No active cardiopulmonary disease.

## 2018-01-09 ENCOUNTER — Encounter (HOSPITAL_BASED_OUTPATIENT_CLINIC_OR_DEPARTMENT_OTHER): Payer: Self-pay | Admitting: Emergency Medicine

## 2018-01-09 ENCOUNTER — Other Ambulatory Visit: Payer: Self-pay

## 2018-01-09 ENCOUNTER — Emergency Department (HOSPITAL_BASED_OUTPATIENT_CLINIC_OR_DEPARTMENT_OTHER)
Admission: EM | Admit: 2018-01-09 | Discharge: 2018-01-09 | Disposition: A | Discharge status: Home / Self Care | Payer: Medicaid Other | Attending: Emergency Medicine | Admitting: Emergency Medicine

## 2018-01-09 ENCOUNTER — Emergency Department (HOSPITAL_BASED_OUTPATIENT_CLINIC_OR_DEPARTMENT_OTHER): Payer: Medicaid Other

## 2018-01-09 DIAGNOSIS — R0602 Shortness of breath: Secondary | ICD-10-CM | POA: Diagnosis present

## 2018-01-09 DIAGNOSIS — M546 Pain in thoracic spine: Secondary | ICD-10-CM | POA: Diagnosis not present

## 2018-01-09 DIAGNOSIS — F17210 Nicotine dependence, cigarettes, uncomplicated: Secondary | ICD-10-CM | POA: Insufficient documentation

## 2018-01-09 DIAGNOSIS — Z79899 Other long term (current) drug therapy: Secondary | ICD-10-CM | POA: Diagnosis not present

## 2018-01-09 DIAGNOSIS — T7840XA Allergy, unspecified, initial encounter: Secondary | ICD-10-CM | POA: Insufficient documentation

## 2018-01-09 MED ORDER — IBUPROFEN 400 MG PO TABS
600.0000 mg | ORAL_TABLET | Freq: Once | ORAL | Status: AC
  Administered 2018-01-09: 600 mg via ORAL
  Filled 2018-01-09: qty 1

## 2018-01-09 MED ORDER — PREDNISONE 20 MG PO TABS: ORAL_TABLET | ORAL | 0 refills | Status: DC

## 2018-01-09 MED ORDER — IBUPROFEN 600 MG PO TABS: 600.0000 mg | ORAL_TABLET | Freq: Four times a day (QID) | ORAL | 0 refills | Status: DC | PRN

## 2018-01-09 MED ORDER — FAMOTIDINE 20 MG PO TABS
20.0000 mg | ORAL_TABLET | Freq: Once | ORAL | Status: AC
  Administered 2018-01-09: 20 mg via ORAL
  Filled 2018-01-09: qty 1

## 2018-01-09 MED ORDER — EPINEPHRINE 0.3 MG/0.3ML IJ SOAJ: 0.3000 mg | Freq: Once | INTRAMUSCULAR | 0 refills | Status: AC | PRN

## 2018-01-09 MED ORDER — METHYLPREDNISOLONE SODIUM SUCC 125 MG IJ SOLR
125.0000 mg | Freq: Once | INTRAMUSCULAR | Status: AC
  Administered 2018-01-09: 125 mg via INTRAMUSCULAR
  Filled 2018-01-09: qty 2

## 2018-01-09 NOTE — ED Provider Notes (Signed)
[facility] EMERGENCY DEPARTMENT Provider Note   CSN: [id number] Arrival date & time: 01/09/18  1028     History   Chief Complaint Chief Complaint  Patient presents with  . Shortness of Breath  . Back Pain    HPI Diane Stein is a 32 y.o. female.  HPI Patient with history of multiple food allergies and she works in a [employer].  She presents with acute onset itching rash to her bilateral upper extremities, shortness of breath, thoracic back pain and nausea.  She had one episode of vomiting.  Symptoms happened while she was at work.  States this is roughly the 6 time she had an allergic reaction while at work.  Did not take any medications prior to coming to the emergency department.  States that her rash has significantly improved as has her shortness of breath.  She still complains of some thoracic back pain.  Has had no facial or intraoral swelling.  No new definite exposures while at work.  No new lower extremity swelling or pain. Past Medical History:  Diagnosis Date  . Anxiety   . Asthma   . Bronchitis   . Depression   . Dysrhythmia    rapid heartrate  . Pneumonia 2014  . Tachycardia 2016    Patient Active Problem List   Diagnosis Date Noted  . Pelvic pain in female 08/28/2017  . Paratubal cyst 08/28/2017  . Chest pain at rest 12/26/2016  . Dizziness 12/26/2016  . Syncope and collapse 08/23/2016  . Depression   . Anxiety   . SVD (spontaneous vaginal delivery) 11/08/2014  . Irregular uterine contractions 11/07/2014  . Palpitations 05/12/2014  . Paroxysmal supraventricular tachycardia (HCC) 05/12/2014  . Pregnancy 05/12/2014    Past Surgical History:  Procedure Laterality Date  . LAPAROSCOPIC UNILATERAL SALPINGECTOMY Left 08/28/2017   Procedure: LAPAROSCOPIC UNILATERAL SALPINGECTOMY WITH REMOVAL OF PARA TUBAL CYST;  Surgeon: Lavina Hamman, MD;  Location: [facility];  Service: Gynecology;  Laterality: Left;  . LAPAROSCOPY Left 08/28/2017   Procedure: LAPAROSCOPY OPERATIVE, FULGERATION OF ENDOMETRIOSIS;  Surgeon: Lavina Hamman, MD;  Location: [facility];  Service: Gynecology;  Laterality: Left;  . OVARIAN CYST SURGERY    . TOOTH EXTRACTION Right 2008     OB History    Gravida  5   Para  1   Term  1   Preterm      AB  3   Living  1     SAB  3   TAB      Ectopic      Multiple  0   Live Births  1            Home Medications    Prior to Admission medications   Medication Sig Start Date End Date Taking? Authorizing Provider  acetaminophen (TYLENOL) 325 MG tablet Take 650 mg by mouth every 6 (six) hours as needed (for pain or headaches).    [provider]  EPINEPHrine 0.3 mg/0.3 mL IJ SOAJ injection Inject 0.3 mLs (0.3 mg total) into the muscle once as needed for up to 1 dose (anaphalaxis). 01/09/18   Loren Racer, MD  famotidine (PEPCID) 20 MG tablet Take 1 tablet (20 mg total) by mouth 2 (two) times daily. 10/30/17   Tilden Fossa, MD  FLUoxetine (PROZAC) 10 MG capsule Take 30 mg by mouth daily.  08/16/16   [provider]  HYDROcodone-acetaminophen (NORCO/VICODIN) 5-325 MG tablet Take 1 tablet by mouth every 6 (six) hours as needed. 09/08/17  Renne CriglerGeiple, Joshua, PA-C  ibuprofen (ADVIL,MOTRIN) 600 MG tablet Take 1 tablet (600 mg total) by mouth every 6 (six) hours as needed. 01/09/18   Loren RacerYelverton, Teya Otterson, MD  LORazepam (ATIVAN) 1 MG tablet Take 1 mg by mouth 3 (three) times daily as needed for anxiety.    [provider]  oxyCODONE-acetaminophen (PERCOCET/ROXICET) 5-325 MG tablet Take 1 tablet by mouth every 8 (eight) hours as needed for severe pain. 08/07/17   Khatri, Hina, PA-C  predniSONE (DELTASONE) 20 MG tablet 3 tabs po day one, then 2 po daily x 4 days 01/10/18   Loren RacerYelverton, Kiosha Buchan, MD  temazepam (RESTORIL) 30 MG capsule Take 30 mg by mouth at bedtime.    [provider]    Family History Family History  Problem Relation Age of Onset  . Hypertension Mother   . Heart  disease Father   . Heart disease Maternal Grandmother   . Hypertension Maternal Grandmother   . Heart disease Maternal Grandfather   . Hypertension Maternal Grandfather   . Heart disease Paternal Grandmother   . Hypertension Paternal Grandmother   . Heart disease Paternal Grandfather   . Hypertension Paternal Grandfather     Social History Social History   Tobacco Use  . Smoking status: Current Every Day Smoker    Packs/day: 0.50    Types: Cigarettes  . Smokeless tobacco: Never Used  Substance Use Topics  . Alcohol use: No  . Drug use: No     Allergies   Avocado; Other; Benadryl [diphenhydramine hcl]; and Benadryl [diphenhydramine]   Review of Systems Review of Systems  Constitutional: Negative for chills and fever.  HENT: Negative for facial swelling, sore throat and trouble swallowing.   Eyes: Negative for visual disturbance.  Respiratory: Positive for shortness of breath. Negative for cough.   Cardiovascular: Negative for chest pain, palpitations and leg swelling.  Gastrointestinal: Negative for abdominal pain, constipation, diarrhea, nausea and vomiting.  Genitourinary: Negative for dysuria, flank pain, frequency and hematuria.  Musculoskeletal: Positive for back pain and myalgias. Negative for neck pain and neck stiffness.  Skin: Positive for rash. Negative for wound.  Neurological: Negative for dizziness, weakness, light-headedness, numbness and headaches.  All other systems reviewed and are negative.    Physical Exam Updated Vital Signs BP 122/73 (BP Location: Right Arm)   Pulse (!) 108   Temp 98.9 F (37.2 C) (Oral)   Resp 20   Ht 5\' 3"  (1.6 m)   Wt 70.3 kg   SpO2 98%   BMI 27.46 kg/m   Physical Exam  Constitutional: She is oriented to person, place, and time. She appears well-developed and well-nourished. No distress.  HENT:  Head: Normocephalic and atraumatic.  Mouth/Throat: Oropharynx is clear and moist. No oropharyngeal exudate.  No facial  swelling.  No intraoral swelling.  Eyes: Pupils are equal, round, and reactive to light. EOM are normal.  Neck: Normal range of motion. Neck supple. No JVD present. No tracheal deviation present.  Cardiovascular: Regular rhythm. Exam reveals no gallop and no friction rub.  No murmur heard. Tachycardia  Pulmonary/Chest: Effort normal and breath sounds normal. No stridor. No respiratory distress. She has no wheezes. She has no rales. She exhibits no tenderness.  Abdominal: Soft. Bowel sounds are normal. There is no tenderness. There is no rebound and no guarding.  Musculoskeletal: Normal range of motion. She exhibits tenderness. She exhibits no edema.  Patient has tenderness to palpation along the medial border of the left scapula.  No midline thoracic or lumbar  tenderness.  No CVA tenderness.  No lower extremity swelling, asymmetry or tenderness.  Distal pulses are 2+.  Lymphadenopathy:    She has no cervical adenopathy.  Neurological: She is alert and oriented to person, place, and time.  Skin: Skin is warm and dry. Capillary refill takes less than 2 seconds. No rash noted. She is not diaphoretic. There is erythema.  Erythematous macular rash to the ulnar surface of bilateral forearms and anterior chest.  Psychiatric: Her behavior is normal.  Anxious appearing  Nursing note and vitals reviewed.    ED Treatments / Results  Labs (all labs ordered are listed, but only abnormal results are displayed) Labs Reviewed - No data to display  EKG None  Radiology Dg Chest 2 View  Result Date: 01/09/2018 CLINICAL DATA:  Shortness of breath.  Smoker. EXAM: CHEST - 2 VIEW COMPARISON:  10/30/2017 FINDINGS: Lungs are adequately inflated and otherwise clear. Cardiomediastinal silhouette is notable for prominence of the main pulmonary artery segment which is unchanged and may be due to pulmonary arterial hypertension. Remainder of the exam is unchanged. IMPRESSION: No acute cardiopulmonary disease.  Stable prominence of the main pulmonary artery segment which may be seen with pulmonary arterial hypertension. Electronically Signed   By: Elberta Fortisaniel  Boyle M.D.   On: 01/09/2018 11:32    Procedures Procedures (including critical care time)  Medications Ordered in ED Medications  methylPREDNISolone sodium succinate (SOLU-MEDROL) 125 mg/2 mL injection 125 mg (125 mg Intramuscular Given 01/09/18 1058)  famotidine (PEPCID) tablet 20 mg (20 mg Oral Given 01/09/18 1058)  ibuprofen (ADVIL,MOTRIN) tablet 600 mg (600 mg Oral Given 01/09/18 1231)     Initial Impression / Assessment and Plan / ED Course  I have reviewed the triage vital signs and the nursing notes.  Pertinent labs & imaging results that were available during my care of the patient were reviewed by me and considered in my medical decision making (see chart for details).     Patient states her symptoms have significantly improved.  No shortness of breath.  Minimal erythematous rash has resolved.  Patient states her thoracic back pain has improved.  On initial presentation back pain was reproducible with palpation.  Likely musculoskeletal.  Low suspicion for pulmonary cause for patient's back pain.  Chest x-ray without acute findings.  Advised the need to follow-up with an allergist.  Return precautions given.  Final Clinical Impressions(s) / ED Diagnoses   Final diagnoses:  Allergic reaction, initial encounter  Acute thoracic back pain, unspecified back pain laterality    ED Discharge Orders         Ordered    predniSONE (DELTASONE) 20 MG tablet     01/09/18 1315    ibuprofen (ADVIL,MOTRIN) 600 MG tablet  Every 6 hours PRN     01/09/18 1315    EPINEPHrine 0.3 mg/0.3 mL IJ SOAJ injection  Once PRN     01/09/18 1315           Loren RacerYelverton, Litha Lamartina, MD 01/09/18 1318

## 2018-01-09 NOTE — ED Triage Notes (Signed)
Patient states that she became SOB and thought that she was having an allergic reaction at work, however she started to have lower back pain and nausea and vomiting which is abnormal for her allergic reactions

## 2018-01-09 NOTE — ED Notes (Signed)
Pt c/o back pain that feels like a sharp pain in the middle of her upper back. No interventions by patient.

## 2018-02-26 NOTE — Patient Instructions (Addendum)
Your procedure is scheduled on: Wednesday, 10/16  Enter through the Main Entrance of [facility] at: 7 am  Pick up the phone at the desk and dial [phone].  Call this number if you have problems the morning of surgery: [phone].  Remember: Do NOT eat food or Do NOT drink clear liquids (including water) after midnight Tuesday.  Take these medicines the morning of surgery with a SIP OF WATER: fluoxetine and xanax if needed.  Brush your teeth on the day of surgery.  Do Not smoke on the day of surgery.  Stop herbal medications, vitamin supplements, Ibuprofen/NSAIDS at this time.  Do NOT wear jewelry (body piercing), metal hair clips/bobby pins, make-up, or nail polish. Do NOT wear lotions, powders, or perfumes.  You may wear deoderant. Do NOT shave for 48 hours prior to surgery. Do NOT bring valuables to the hospital.  Leave suitcase in car.  After surgery it may be brought to your room.  For patients admitted to the hospital, checkout time is 11:00 AM the day of discharge. Home with Mother Elease Hashimoto cell [phone].

## 2018-03-03 ENCOUNTER — Encounter (HOSPITAL_COMMUNITY): Payer: Self-pay | Admitting: *Deleted

## 2018-03-03 ENCOUNTER — Encounter (HOSPITAL_COMMUNITY)
Admission: RE | Admit: 2018-03-03 | Discharge: 2018-03-03 | Disposition: A | Discharge status: Home / Self Care | Payer: Medicaid Other | Source: Ambulatory Visit | Attending: Obstetrics and Gynecology | Admitting: Obstetrics and Gynecology

## 2018-03-03 ENCOUNTER — Other Ambulatory Visit: Payer: Self-pay

## 2018-03-03 DIAGNOSIS — Z01812 Encounter for preprocedural laboratory examination: Secondary | ICD-10-CM | POA: Diagnosis not present

## 2018-03-03 HISTORY — DX: Nicotine dependence, unspecified, uncomplicated: F17.200

## 2018-03-03 LAB — CBC
HCT: 43.7 % (ref 36.0–46.0)
HEMOGLOBIN: 14.7 g/dL (ref 12.0–15.0)
MCH: 31.7 pg (ref 26.0–34.0)
MCHC: 33.6 g/dL (ref 30.0–36.0)
MCV: 94.4 fL (ref 80.0–100.0)
NRBC: 0 % (ref 0.0–0.2)
Platelets: 278 10*3/uL (ref 150–400)
RBC: 4.63 MIL/uL (ref 3.87–5.11)
RDW: 14.2 % (ref 11.5–15.5)
WBC: 12.6 10*3/uL — ABNORMAL HIGH (ref 4.0–10.5)

## 2018-03-09 NOTE — Anesthesia Preprocedure Evaluation (Signed)
Anesthesia Evaluation  Patient identified by MRN, date of birth, ID band Patient awake    Reviewed: Allergy & Precautions, NPO status , Patient's Chart, lab work & pertinent test results  Airway Mallampati: I       Dental no notable dental hx. (+) Teeth Intact   Pulmonary Current Smoker,    Pulmonary exam normal breath sounds clear to auscultation       Cardiovascular negative cardio ROS Normal cardiovascular exam Rhythm:Regular Rate:Normal     Neuro/Psych PSYCHIATRIC DISORDERS Anxiety Depression    GI/Hepatic negative GI ROS, Neg liver ROS,   Endo/Other  negative endocrine ROS  Renal/GU negative Renal ROS  negative genitourinary   Musculoskeletal negative musculoskeletal ROS (+)   Abdominal Normal abdominal exam  (+)   Peds  Hematology negative hematology ROS (+)   Anesthesia Other Findings   Reproductive/Obstetrics negative OB ROS                             Anesthesia Physical  Anesthesia Plan  ASA: II  Anesthesia Plan: General   Post-op Pain Management:    Induction: Intravenous  PONV Risk Score and Plan: 4 or greater and Ondansetron, Dexamethasone, Treatment may vary due to age or medical condition, Midazolam and Scopolamine patch - Pre-op  Airway Management Planned: Oral ETT and LMA  Additional Equipment:   Intra-op Plan:   Post-operative Plan: Extubation in OR  Informed Consent: I have reviewed the patients History and Physical, chart, labs and discussed the procedure including the risks, benefits and alternatives for the proposed anesthesia with the patient or authorized representative who has indicated his/her understanding and acceptance.   Dental advisory given  Plan Discussed with: CRNA, Surgeon and Anesthesiologist  Anesthesia Plan Comments: (  )        Anesthesia Quick Evaluation

## 2018-03-09 NOTE — H&P (Signed)
Diane Stein is an 32 y.o. female. She had laparoscopy with left salpingectomy for large paratubal cyst and FOE in April for chronic pelvic pain, worse on the left.  Her pain did not improve.  Still having significant pelvic pain and some irregular spotting. Recently changed from POP to Lo loestrin to see if BTB would improve, not much improvement so far. having bilateral pelvic pain, every daqy, no specific aggravating or relieving factors. Rates 9/10, but able to do a physical job. Tylenol and Ibuprofen are little help, Flexeril helps a little. No F/C, some nausea, no emesis, no urinary or bowel problems.  Pelvic ultrasound is normal.  All medical and surgical options discussed, she wants definitive surgical therapy.  Pertinent Gynecological History: Last pap: normal Date: 2015 OB History: G4, P1031   Menstrual History: No LMP recorded. (Menstrual status: Oral contraceptives).    Past Medical History:  Diagnosis Date  . Anxiety   . 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  Patient does not have an inhaler  . Bronchitis    history  . Depression   . Dysrhythmia    rapid heartrate  . Pneumonia 2014   History  . Smoker    0.5 ppd x 10 yrs  . SVD (spontaneous vaginal delivery)    x 1  . Tachycardia 2016    Past Surgical History:  Procedure Laterality Date  . LAPAROSCOPIC UNILATERAL SALPINGECTOMY Left 08/28/2017   Procedure: LAPAROSCOPIC UNILATERAL SALPINGECTOMY WITH REMOVAL OF PARA TUBAL CYST;  Surgeon: Lavina Hamman, MD;  Location: [facility];  Service: Gynecology;  Laterality: Left;  . LAPAROSCOPY Left 08/28/2017   Procedure: LAPAROSCOPY OPERATIVE, FULGERATION OF ENDOMETRIOSIS;  Surgeon: Lavina Hamman, MD;  Location: [facility];  Service: Gynecology;  Laterality: Left;  . OVARIAN CYST SURGERY    . TOOTH EXTRACTION Right 2008    Family History  Problem Relation Age of Onset  . Hypertension Mother   . Heart disease Father   .  Heart disease Maternal Grandmother   . Hypertension Maternal Grandmother   . Heart disease Maternal Grandfather   . Hypertension Maternal Grandfather   . Heart disease Paternal Grandmother   . Hypertension Paternal Grandmother   . Heart disease Paternal Grandfather   . Hypertension Paternal Grandfather     Social History:  reports that she has been smoking cigarettes. She has a 5.00 pack-year smoking history. She has never used smokeless tobacco. She reports that she drinks about 5.0 standard drinks of alcohol per week. She reports that she does not use drugs.  Allergies:  Allergies  Allergen Reactions  . Avocado Hives    Shortness of breath and dizziness  . Other Hives    Pine Nuts = HIVES Avacados = HIVES  . Benadryl [Diphenhydramine Hcl] Other (See Comments)    Restless legs result if this is taken  . Benadryl [Diphenhydramine]     Restless leg syndrome    No medications prior to admission.    Review of Systems  Respiratory: Negative.   Cardiovascular: Negative.     There were no vitals taken for this visit. Physical Exam  Constitutional: She appears well-developed and well-nourished.  Neck: Neck supple. No thyromegaly present.  Cardiovascular: Normal rate, regular rhythm and normal heart sounds.  No murmur heard. Respiratory: Effort normal and breath sounds normal. No respiratory distress. She has no wheezes.  GI: Soft. She exhibits no mass. There is no guarding. Rebound: mild, BLQ.  Genitourinary:  Vagina normal and uterus normal.  Genitourinary Comments: No adnexal mass, tender bilaterally    No results found for this or any previous visit (from the past 24 hour(s)).  No results found.  Assessment/Plan: Chronic pelvic pain, laparoscopy in April with minimal endometriosis and left paratubal cyst.  Surgery and OCP/POP have not improved her pain.  All medical and surgical options discussed, she requests definitive surgical therapy.  Surgical routes and options  discussed, she wants TAH/BSO.  Surgical procedure, risks, chances of relieving her pain have all been discussed.  Will admit for TAH/BSO.  Leighton Roach Tashaya Ancrum 03/09/2018, 4:39 PM

## 2018-03-10 ENCOUNTER — Inpatient Hospital Stay (HOSPITAL_COMMUNITY)
Admission: AD | Admit: 2018-03-10 | Discharge: 2018-03-12 | DRG: 983 | Disposition: A | Discharge status: Home / Self Care | Payer: Medicaid Other | Attending: Obstetrics and Gynecology | Admitting: Obstetrics and Gynecology

## 2018-03-10 ENCOUNTER — Encounter (HOSPITAL_COMMUNITY): Payer: Self-pay | Admitting: *Deleted

## 2018-03-10 ENCOUNTER — Ambulatory Visit (HOSPITAL_COMMUNITY): Payer: Medicaid Other | Admitting: Anesthesiology

## 2018-03-10 ENCOUNTER — Other Ambulatory Visit: Payer: Self-pay

## 2018-03-10 ENCOUNTER — Encounter (HOSPITAL_COMMUNITY)
Admission: AD | Disposition: A | Discharge status: Home / Self Care | Payer: Self-pay | Source: Home / Self Care | Attending: Obstetrics and Gynecology

## 2018-03-10 DIAGNOSIS — Z90710 Acquired absence of both cervix and uterus: Secondary | ICD-10-CM

## 2018-03-10 DIAGNOSIS — F17210 Nicotine dependence, cigarettes, uncomplicated: Secondary | ICD-10-CM | POA: Diagnosis present

## 2018-03-10 DIAGNOSIS — Z9079 Acquired absence of other genital organ(s): Secondary | ICD-10-CM

## 2018-03-10 DIAGNOSIS — G8929 Other chronic pain: Principal | ICD-10-CM | POA: Diagnosis present

## 2018-03-10 DIAGNOSIS — R102 Pelvic and perineal pain: Secondary | ICD-10-CM | POA: Diagnosis not present

## 2018-03-10 DIAGNOSIS — Z90722 Acquired absence of ovaries, bilateral: Secondary | ICD-10-CM

## 2018-03-10 DIAGNOSIS — Z23 Encounter for immunization: Secondary | ICD-10-CM | POA: Diagnosis not present

## 2018-03-10 HISTORY — PX: HYSTERECTOMY ABDOMINAL WITH SALPINGO-OOPHORECTOMY: SHX6792

## 2018-03-10 LAB — PREGNANCY, URINE: Preg Test, Ur: NEGATIVE

## 2018-03-10 SURGERY — HYSTERECTOMY, ABDOMINAL, WITH SALPINGO-OOPHORECTOMY
Anesthesia: General | Laterality: Bilateral

## 2018-03-10 MED ORDER — OXYCODONE HCL 5 MG/5ML PO SOLN: 5.0000 mg | Freq: Once | ORAL | Status: DC | PRN

## 2018-03-10 MED ORDER — LACTATED RINGERS IV SOLN
INTRAVENOUS | Status: DC
  Administered 2018-03-10: 12:00:00 via INTRAVENOUS

## 2018-03-10 MED ORDER — GLYCOPYRROLATE 0.2 MG/ML IJ SOLN
INTRAMUSCULAR | Status: AC
  Filled 2018-03-10: qty 1

## 2018-03-10 MED ORDER — SCOPOLAMINE 1 MG/3DAYS TD PT72
MEDICATED_PATCH | TRANSDERMAL | Status: AC
  Filled 2018-03-10: qty 1

## 2018-03-10 MED ORDER — IBUPROFEN 600 MG PO TABS
600.0000 mg | ORAL_TABLET | Freq: Four times a day (QID) | ORAL | Status: DC | PRN
  Administered 2018-03-11: 600 mg via ORAL
  Filled 2018-03-10: qty 1

## 2018-03-10 MED ORDER — LIDOCAINE HCL (CARDIAC) PF 100 MG/5ML IV SOSY
PREFILLED_SYRINGE | INTRAVENOUS | Status: DC | PRN
  Administered 2018-03-10: 80 mg via INTRAVENOUS

## 2018-03-10 MED ORDER — KETOROLAC TROMETHAMINE 30 MG/ML IJ SOLN
30.0000 mg | Freq: Four times a day (QID) | INTRAMUSCULAR | Status: DC
  Administered 2018-03-10 – 2018-03-11 (×4): 30 mg via INTRAVENOUS
  Filled 2018-03-10 (×4): qty 1

## 2018-03-10 MED ORDER — ALPRAZOLAM 1 MG PO TABS
2.0000 mg | ORAL_TABLET | Freq: Two times a day (BID) | ORAL | Status: DC
  Administered 2018-03-10 – 2018-03-12 (×5): 2 mg via ORAL
  Filled 2018-03-10 (×5): qty 2

## 2018-03-10 MED ORDER — SCOPOLAMINE 1 MG/3DAYS TD PT72: 1.0000 | MEDICATED_PATCH | TRANSDERMAL | Status: DC

## 2018-03-10 MED ORDER — SCOPOLAMINE 1 MG/3DAYS TD PT72
1.0000 | MEDICATED_PATCH | Freq: Once | TRANSDERMAL | Status: DC
  Administered 2018-03-10: 1.5 mg via TRANSDERMAL

## 2018-03-10 MED ORDER — ONDANSETRON HCL 4 MG/2ML IJ SOLN: 4.0000 mg | Freq: Once | INTRAMUSCULAR | Status: DC | PRN

## 2018-03-10 MED ORDER — PROPOFOL 10 MG/ML IV BOLUS
INTRAVENOUS | Status: AC
  Filled 2018-03-10: qty 40

## 2018-03-10 MED ORDER — CEFAZOLIN SODIUM-DEXTROSE 2-4 GM/100ML-% IV SOLN
INTRAVENOUS | Status: AC
  Filled 2018-03-10: qty 100

## 2018-03-10 MED ORDER — DEXAMETHASONE SODIUM PHOSPHATE 10 MG/ML IJ SOLN
INTRAMUSCULAR | Status: AC
  Filled 2018-03-10: qty 1

## 2018-03-10 MED ORDER — SODIUM CHLORIDE 0.9% FLUSH: 9.0000 mL | INTRAVENOUS | Status: DC | PRN

## 2018-03-10 MED ORDER — SUGAMMADEX SODIUM 200 MG/2ML IV SOLN
INTRAVENOUS | Status: DC | PRN
  Administered 2018-03-10: 140 mg via INTRAVENOUS

## 2018-03-10 MED ORDER — MEPERIDINE HCL 25 MG/ML IJ SOLN: 6.2500 mg | INTRAMUSCULAR | Status: DC | PRN

## 2018-03-10 MED ORDER — GABAPENTIN 300 MG PO CAPS
600.0000 mg | ORAL_CAPSULE | ORAL | Status: AC
  Administered 2018-03-10: 600 mg via ORAL

## 2018-03-10 MED ORDER — LIDOCAINE HCL (CARDIAC) PF 100 MG/5ML IV SOSY
PREFILLED_SYRINGE | INTRAVENOUS | Status: AC
  Filled 2018-03-10: qty 5

## 2018-03-10 MED ORDER — ACETAMINOPHEN 325 MG PO TABS: 325.0000 mg | ORAL_TABLET | ORAL | Status: DC | PRN

## 2018-03-10 MED ORDER — DOXEPIN HCL 50 MG PO CAPS
50.0000 mg | ORAL_CAPSULE | Freq: Every day | ORAL | Status: DC
  Administered 2018-03-10 – 2018-03-11 (×2): 100 mg via ORAL
  Filled 2018-03-10 (×2): qty 2

## 2018-03-10 MED ORDER — DEXAMETHASONE SODIUM PHOSPHATE 10 MG/ML IJ SOLN
INTRAMUSCULAR | Status: DC | PRN
  Administered 2018-03-10: 8 mg via INTRAVENOUS

## 2018-03-10 MED ORDER — 0.9 % SODIUM CHLORIDE (POUR BTL) OPTIME
TOPICAL | Status: DC | PRN
  Administered 2018-03-10: 2000 mL

## 2018-03-10 MED ORDER — ONDANSETRON HCL 4 MG PO TABS
4.0000 mg | ORAL_TABLET | Freq: Four times a day (QID) | ORAL | Status: DC | PRN
  Administered 2018-03-11: 4 mg via ORAL
  Filled 2018-03-10: qty 1

## 2018-03-10 MED ORDER — FENTANYL CITRATE (PF) 100 MCG/2ML IJ SOLN
INTRAMUSCULAR | Status: AC
  Administered 2018-03-10: 50 ug via INTRAVENOUS
  Filled 2018-03-10: qty 2

## 2018-03-10 MED ORDER — ONDANSETRON HCL 4 MG/2ML IJ SOLN: 4.0000 mg | Freq: Four times a day (QID) | INTRAMUSCULAR | Status: DC | PRN

## 2018-03-10 MED ORDER — DEXTROSE-NACL 5-0.45 % IV SOLN
INTRAVENOUS | Status: DC
  Administered 2018-03-10 – 2018-03-11 (×2): via INTRAVENOUS

## 2018-03-10 MED ORDER — KETOROLAC TROMETHAMINE 30 MG/ML IJ SOLN: 30.0000 mg | Freq: Four times a day (QID) | INTRAMUSCULAR | Status: DC

## 2018-03-10 MED ORDER — ONDANSETRON HCL 4 MG/2ML IJ SOLN
INTRAMUSCULAR | Status: AC
  Filled 2018-03-10: qty 2

## 2018-03-10 MED ORDER — ONDANSETRON HCL 4 MG/2ML IJ SOLN
INTRAMUSCULAR | Status: DC | PRN
  Administered 2018-03-10: 4 mg via INTRAVENOUS

## 2018-03-10 MED ORDER — FENTANYL CITRATE (PF) 100 MCG/2ML IJ SOLN
INTRAMUSCULAR | Status: AC
  Filled 2018-03-10: qty 2

## 2018-03-10 MED ORDER — HYDROMORPHONE HCL 1 MG/ML IJ SOLN: 1.0000 mg | Freq: Once | INTRAMUSCULAR | Status: DC

## 2018-03-10 MED ORDER — ACETAMINOPHEN 500 MG PO TABS
1000.0000 mg | ORAL_TABLET | ORAL | Status: AC
  Administered 2018-03-10: 1000 mg via ORAL

## 2018-03-10 MED ORDER — CEFAZOLIN SODIUM-DEXTROSE 2-4 GM/100ML-% IV SOLN
2.0000 g | INTRAVENOUS | Status: AC
  Administered 2018-03-10: 2 g via INTRAVENOUS

## 2018-03-10 MED ORDER — ALPRAZOLAM 1 MG PO TABS
1.0000 mg | ORAL_TABLET | Freq: Every day | ORAL | Status: DC | PRN
  Administered 2018-03-11 – 2018-03-12 (×2): 1 mg via ORAL
  Filled 2018-03-10 (×2): qty 1

## 2018-03-10 MED ORDER — ACETAMINOPHEN 160 MG/5ML PO SOLN: 325.0000 mg | ORAL | Status: DC | PRN

## 2018-03-10 MED ORDER — ACETAMINOPHEN 500 MG PO TABS
1000.0000 mg | ORAL_TABLET | Freq: Four times a day (QID) | ORAL | Status: DC | PRN
  Administered 2018-03-10 – 2018-03-11 (×2): 1000 mg via ORAL
  Filled 2018-03-10 (×2): qty 2

## 2018-03-10 MED ORDER — CELECOXIB 200 MG PO CAPS
ORAL_CAPSULE | ORAL | Status: AC
  Filled 2018-03-10: qty 2

## 2018-03-10 MED ORDER — GABAPENTIN 300 MG PO CAPS
ORAL_CAPSULE | ORAL | Status: AC
  Filled 2018-03-10: qty 2

## 2018-03-10 MED ORDER — PROPOFOL 10 MG/ML IV BOLUS
INTRAVENOUS | Status: DC | PRN
  Administered 2018-03-10: 150 mg via INTRAVENOUS

## 2018-03-10 MED ORDER — ACETAMINOPHEN 500 MG PO TABS
ORAL_TABLET | ORAL | Status: AC
  Filled 2018-03-10: qty 2

## 2018-03-10 MED ORDER — NALOXONE HCL 0.4 MG/ML IJ SOLN: 0.4000 mg | INTRAMUSCULAR | Status: DC | PRN

## 2018-03-10 MED ORDER — FENTANYL CITRATE (PF) 100 MCG/2ML IJ SOLN
25.0000 ug | INTRAMUSCULAR | Status: DC | PRN
  Administered 2018-03-10 (×2): 50 ug via INTRAVENOUS

## 2018-03-10 MED ORDER — MENTHOL 3 MG MT LOZG: 1.0000 | LOZENGE | OROMUCOSAL | Status: DC | PRN

## 2018-03-10 MED ORDER — CELECOXIB 200 MG PO CAPS
400.0000 mg | ORAL_CAPSULE | ORAL | Status: AC
  Administered 2018-03-10: 400 mg via ORAL

## 2018-03-10 MED ORDER — OXYCODONE HCL 5 MG PO TABS: 5.0000 mg | ORAL_TABLET | Freq: Once | ORAL | Status: DC | PRN

## 2018-03-10 MED ORDER — ROCURONIUM BROMIDE 100 MG/10ML IV SOLN
INTRAVENOUS | Status: DC | PRN
  Administered 2018-03-10: 5 mg via INTRAVENOUS
  Administered 2018-03-10: 40 mg via INTRAVENOUS
  Administered 2018-03-10: 10 mg via INTRAVENOUS

## 2018-03-10 MED ORDER — HYDROMORPHONE HCL 1 MG/ML IJ SOLN
INTRAMUSCULAR | Status: AC
  Filled 2018-03-10: qty 1

## 2018-03-10 MED ORDER — CYCLOBENZAPRINE HCL 10 MG PO TABS
10.0000 mg | ORAL_TABLET | Freq: Three times a day (TID) | ORAL | Status: DC | PRN
  Filled 2018-03-10: qty 1

## 2018-03-10 MED ORDER — SUGAMMADEX SODIUM 200 MG/2ML IV SOLN
INTRAVENOUS | Status: AC
  Filled 2018-03-10: qty 4

## 2018-03-10 MED ORDER — FLUOXETINE HCL 20 MG PO CAPS
60.0000 mg | ORAL_CAPSULE | Freq: Every day | ORAL | Status: DC
  Administered 2018-03-10 – 2018-03-12 (×3): 60 mg via ORAL
  Filled 2018-03-10 (×3): qty 3

## 2018-03-10 MED ORDER — OXYCODONE HCL 5 MG PO TABS
5.0000 mg | ORAL_TABLET | ORAL | Status: DC | PRN
  Administered 2018-03-11 (×2): 5 mg via ORAL
  Filled 2018-03-10 (×2): qty 1

## 2018-03-10 MED ORDER — MIDAZOLAM HCL 2 MG/2ML IJ SOLN
INTRAMUSCULAR | Status: AC
  Filled 2018-03-10: qty 2

## 2018-03-10 MED ORDER — ALUM & MAG HYDROXIDE-SIMETH 200-200-20 MG/5ML PO SUSP: 30.0000 mL | ORAL | Status: DC | PRN

## 2018-03-10 MED ORDER — INFLUENZA VAC SPLIT QUAD 0.5 ML IM SUSY
0.5000 mL | PREFILLED_SYRINGE | INTRAMUSCULAR | Status: AC
  Administered 2018-03-12: 0.5 mL via INTRAMUSCULAR
  Filled 2018-03-10: qty 0.5

## 2018-03-10 MED ORDER — KETOROLAC TROMETHAMINE 30 MG/ML IJ SOLN: 30.0000 mg | Freq: Once | INTRAMUSCULAR | Status: DC

## 2018-03-10 MED ORDER — SIMETHICONE 80 MG PO CHEW: 80.0000 mg | CHEWABLE_TABLET | Freq: Four times a day (QID) | ORAL | Status: DC | PRN

## 2018-03-10 MED ORDER — FENTANYL CITRATE (PF) 250 MCG/5ML IJ SOLN
INTRAMUSCULAR | Status: AC
  Filled 2018-03-10: qty 5

## 2018-03-10 MED ORDER — GABAPENTIN 300 MG PO CAPS
300.0000 mg | ORAL_CAPSULE | Freq: Three times a day (TID) | ORAL | Status: DC
  Administered 2018-03-10 – 2018-03-12 (×6): 300 mg via ORAL
  Filled 2018-03-10 (×7): qty 1

## 2018-03-10 MED ORDER — HYDROMORPHONE 1 MG/ML IV SOLN
INTRAVENOUS | Status: DC
  Administered 2018-03-10: 1.6 mg via INTRAVENOUS
  Administered 2018-03-10: 30 mg via INTRAVENOUS
  Administered 2018-03-10 (×2): 0.6 mg via INTRAVENOUS
  Administered 2018-03-11: 1.4 mg via INTRAVENOUS
  Administered 2018-03-11: 0.4 mg via INTRAVENOUS
  Filled 2018-03-10: qty 30

## 2018-03-10 MED ORDER — LACTATED RINGERS IV SOLN
INTRAVENOUS | Status: DC
  Administered 2018-03-10 (×2): via INTRAVENOUS

## 2018-03-10 MED ORDER — MIDAZOLAM HCL 2 MG/2ML IJ SOLN
INTRAMUSCULAR | Status: DC | PRN
  Administered 2018-03-10: 2 mg via INTRAVENOUS

## 2018-03-10 MED ORDER — HYDROMORPHONE HCL 1 MG/ML IJ SOLN
INTRAMUSCULAR | Status: DC | PRN
  Administered 2018-03-10: 1 mg via INTRAVENOUS

## 2018-03-10 MED ORDER — FENTANYL CITRATE (PF) 100 MCG/2ML IJ SOLN
INTRAMUSCULAR | Status: DC | PRN
  Administered 2018-03-10 (×7): 50 ug via INTRAVENOUS

## 2018-03-10 MED ORDER — ALPRAZOLAM ER 1 MG PO TB24: 1.0000 mg | ORAL_TABLET | Freq: Every day | ORAL | Status: DC

## 2018-03-10 SURGICAL SUPPLY — 42 items
APL SKNCLS STERI-STRIP NONHPOA (GAUZE/BANDAGES/DRESSINGS) ×2
BENZOIN TINCTURE PRP APPL 2/3 (GAUZE/BANDAGES/DRESSINGS) ×3 IMPLANT
CANISTER SUCT 3000ML PPV (MISCELLANEOUS) ×4 IMPLANT
CATH ROBINSON RED A/P 16FR (CATHETERS) ×4 IMPLANT
CLOSURE WOUND 1/2 X4 (GAUZE/BANDAGES/DRESSINGS) ×1
CONT PATH 16OZ SNAP LID 3702 (MISCELLANEOUS) ×4 IMPLANT
DRAPE WARM FLUID 44X44 (DRAPE) ×3 IMPLANT
DRSG OPSITE POSTOP 4X10 (GAUZE/BANDAGES/DRESSINGS) ×8 IMPLANT
DURAPREP 26ML APPLICATOR (WOUND CARE) ×4 IMPLANT
GAUZE 4X4 16PLY RFD (DISPOSABLE) ×3 IMPLANT
GLOVE BIO SURGEON STRL SZ8 (GLOVE) ×4 IMPLANT
GLOVE BIOGEL PI IND STRL 7.0 (GLOVE) ×6 IMPLANT
GLOVE BIOGEL PI INDICATOR 7.0 (GLOVE) ×6
GLOVE ORTHO TXT STRL SZ7.5 (GLOVE) ×4 IMPLANT
GOWN STRL REUS W/TWL LRG LVL3 (GOWN DISPOSABLE) ×8 IMPLANT
NEEDLE HYPO 22GX1.5 SAFETY (NEEDLE) ×3 IMPLANT
NS IRRIG 1000ML POUR BTL (IV SOLUTION) ×4 IMPLANT
PACK ABDOMINAL GYN (CUSTOM PROCEDURE TRAY) ×4 IMPLANT
PAD OB MATERNITY 4.3X12.25 (PERSONAL CARE ITEMS) ×4 IMPLANT
PENCIL SMOKE EVAC W/HOLSTER (ELECTROSURGICAL) ×4 IMPLANT
PROTECTOR NERVE ULNAR (MISCELLANEOUS) ×4 IMPLANT
RETRACTOR WND ALEXIS 25 LRG (MISCELLANEOUS) ×2 IMPLANT
RTRCTR WOUND ALEXIS 25CM LRG (MISCELLANEOUS) ×4
SET CYSTO W/LG BORE CLAMP LF (SET/KITS/TRAYS/PACK) ×4 IMPLANT
SPONGE LAP 18X18 X RAY DECT (DISPOSABLE) ×8 IMPLANT
STAPLER VISISTAT 35W (STAPLE) ×4 IMPLANT
STRIP CLOSURE SKIN 1/2X4 (GAUZE/BANDAGES/DRESSINGS) ×2 IMPLANT
SUT CHROMIC 1MO 4 18 CR8 (SUTURE) ×12 IMPLANT
SUT PDS AB 0 CTX 60 (SUTURE) IMPLANT
SUT PLAIN 2 0 XLH (SUTURE) ×3 IMPLANT
SUT SILK 2 0 SH (SUTURE) ×4 IMPLANT
SUT VIC AB 0 CT1 27 (SUTURE) ×8
SUT VIC AB 0 CT1 27XBRD ANBCTR (SUTURE) ×2 IMPLANT
SUT VIC AB 2-0 CT1 27 (SUTURE) ×8
SUT VIC AB 2-0 CT1 TAPERPNT 27 (SUTURE) ×2 IMPLANT
SUT VIC AB 3-0 SH 27 (SUTURE)
SUT VIC AB 3-0 SH 27X BRD (SUTURE) IMPLANT
SUT VIC AB 4-0 KS 27 (SUTURE) ×3 IMPLANT
SUT VICRYL 0 TIES 12 18 (SUTURE) ×4 IMPLANT
SYR CONTROL 10ML LL (SYRINGE) ×3 IMPLANT
TOWEL OR 17X24 6PK STRL BLUE (TOWEL DISPOSABLE) ×8 IMPLANT
TRAY FOLEY W/BAG SLVR 14FR (SET/KITS/TRAYS/PACK) ×4 IMPLANT

## 2018-03-10 NOTE — Op Note (Signed)
Preoperative diagnosis: Chronic pelvic pain Postoperative diagnosis: Same Procedure: Total abdominal hysterectomy, bilateral salpingo-oophorectomy Surgeon: Lavina Hamman M.D. Assistant: Doristine Counter, D.O. Anesthesia: General Findings: She had a small uterus, absent left fallopian tube, normal appearing ovaries and right tube, no obvious pathology.  Dr. Shella Spearing assistance was essential to the case, she took down pedicles on her side and was vital to the case Estimated blood loss: 50cc Complications: None  Procedure in detail:  The patient was taken to the operating room and placed in the dorsal supine position. General anesthesia was induced. Abdomen perineum and vagina were prepped and draped in the usual sterile fashion and a Foley catheter was placed. Her abdomen was then entered via a standard Pfannenstiel incision.  An Alexis self-retaining retractor was placed and bowels were packed out of the pelvis. Uterine cornu were grasped with large Kelly clamps. Round ligaments were taken down with electrocautery. On each side a window was made in an avascular portion of the broad ligament, the ovaries and tubes were elevated and the infundibulopelvic ligament was clamped transected and ligated with #1 chromic. This adequately freed both tubes and ovaries.  Broad ligaments were taken down with Bovie and the anterior peritoneum was incised across the cervix with Bovie to release the bladder.   Cardinal ligaments, uterine arteries, and most of the  vaginal pedicles were clamped with Zeppelin clamps, pedicles cut and sutured with #1 Chromic.  Vaginal angles were then clamped, cut and sutured with #1 chromic and a figure-of-eight suture of #1 chromic was then used to close the rest of the vaginal cuff. All pedicles were inspected and found to be hemostatic. The pelvis was irrigated and found to be hemostatic. Ureters were seen inferior to all pedicles. The lap sponges were removed from the abdomen and the  Alexis retractor was removed. Peritoneum was identified and closed with running 2-0 Vicryl. Fascia was closed in a running fashion starting at both ends and meeting in the middle with 0 Vicryl. Subcutaneous tissue was irrigated and made hemostatic with Bovie. Subcutaneous tissue was then closed with running 2-0 plain gut suture. Skins incision was then closed with running subcuticular 4-0 Vicryl, Benzoin, steri-strips and a sterile bandage. Patient tolerated procedure well and was taken to the PACU in stable condition. Counts were correct, she had PAS hose on throughout the procedure, she received Ancef 2 gm IV.

## 2018-03-10 NOTE — Anesthesia Postprocedure Evaluation (Signed)
Anesthesia Post Note  Patient: Diane Stein  Procedure(s) Performed: TOTAL ABDOMINAL HYSTERECTOMY WITH BILATERAL SALPINGO-OOPHORECTOMY (Bilateral )     Patient location during evaluation: PACU Anesthesia Type: General Level of consciousness: awake and alert Pain management: pain level controlled Vital Signs Assessment: post-procedure vital signs reviewed and stable Respiratory status: spontaneous breathing, nonlabored ventilation, respiratory function stable and patient connected to nasal cannula oxygen Cardiovascular status: blood pressure returned to baseline and stable Postop Assessment: no apparent nausea or vomiting Anesthetic complications: no    Last Vitals:  Vitals:   03/10/18 1459 03/10/18 1559  BP: 125/74 132/77  Pulse: (!) 102 93  Resp: 18   Temp: 36.8 C 36.7 C  SpO2:  97%    Last Pain:  Vitals:   03/10/18 1559  TempSrc: Oral  PainSc:    Pain Goal: Patients Stated Pain Goal: 3 (03/10/18 1245)               Loki Wuthrich

## 2018-03-10 NOTE — Interval H&P Note (Signed)
History and Physical Interval Note:  03/10/2018 8:08 AM  Diane Stein  has presented today for surgery, with the diagnosis of chronic pelvic pain  The various methods of treatment have been discussed with the patient and family. After consideration of risks, benefits and other options for treatment, the patient has consented to  Procedure(s) with comments: TOTAL HYSTERECTOMY ABDOMINAL (N/A) - with BSO CYSTOSCOPY (N/A) as a surgical intervention .  The patient's history has been reviewed, patient examined, no change in status, stable for surgery.  I have reviewed the patient's chart and labs.  Questions were answered to the patient's satisfaction.  She understands that removing her ovaries will put her into immediate menopause and she will most likely have symptoms within days.  She requests her ovaries be removed.   Leighton Roach Gerrod Maule

## 2018-03-10 NOTE — Transfer of Care (Signed)
Immediate Anesthesia Transfer of Care Note  Patient: Diane Stein  Procedure(s) Performed: TOTAL ABDOMINAL HYSTERECTOMY WITH BILATERAL SALPINGO-OOPHORECTOMY (Bilateral )  Patient Location: PACU  Anesthesia Type:General  Level of Consciousness: awake, alert , oriented and patient cooperative  Airway & Oxygen Therapy: Patient Spontanous Breathing and Patient connected to nasal cannula oxygen  Post-op Assessment: Report given to RN and Post -op Vital signs reviewed and stable  Post vital signs: Reviewed and stable  Last Vitals:  Vitals Value Taken Time  BP 111/61 03/10/2018 10:11 AM  Temp    Pulse 103 03/10/2018 10:12 AM  Resp 8 03/10/2018 10:12 AM  SpO2 96 % 03/10/2018 10:12 AM  Vitals shown include unvalidated device data.  Last Pain:  Vitals:   03/10/18 0713  TempSrc: Oral  PainSc: 3       Patients Stated Pain Goal: 4 (03/10/18 1610)  Complications: No apparent anesthesia complications

## 2018-03-10 NOTE — Anesthesia Procedure Notes (Signed)
Procedure Name: Intubation Date/Time: 03/10/2018 8:32 AM Performed by: Georgeanne Nim, CRNA Pre-anesthesia Checklist: Patient identified, Emergency Drugs available, Suction available, Patient being monitored and Timeout performed Patient Re-evaluated:Patient Re-evaluated prior to induction Oxygen Delivery Method: Circle system utilized Preoxygenation: Pre-oxygenation with 100% oxygen Induction Type: IV induction Ventilation: Mask ventilation without difficulty Laryngoscope Size: Mac and 4 Grade View: Grade I Tube size: 7.0 mm Number of attempts: 1 Airway Equipment and Method: Stylet Placement Confirmation: ETT inserted through vocal cords under direct vision,  positive ETCO2,  CO2 detector and breath sounds checked- equal and bilateral Secured at: 20 cm Tube secured with: Tape Dental Injury: Teeth and Oropharynx as per pre-operative assessment

## 2018-03-10 NOTE — Progress Notes (Addendum)
Day of Surgery Procedure(s) (LRB): TOTAL ABDOMINAL HYSTERECTOMY WITH BILATERAL SALPINGO-OOPHORECTOMY (Bilateral)  Subjective: Patient reports incisional pain and tolerating PO.  Pain controlled.  Has walked.    Objective: I have reviewed patient's vital signs, intake and output and medications.  General: alert and no distress Resp: clear to auscultation bilaterally Cardio: regular rate and rhythm GI: soft, non-tender; bowel sounds normal; no masses,  no organomegaly and incision: bandage intact Extremities: sym, NT; SCD's in place  Assessment: s/p Procedure(s): TOTAL ABDOMINAL HYSTERECTOMY WITH BILATERAL SALPINGO-OOPHORECTOMY (Bilateral): stable  Plan: Advance diet Encourage ambulation Continue current care.    LOS: 0 days    Diane Stein 03/10/2018, 7:00 PM

## 2018-03-11 ENCOUNTER — Encounter (HOSPITAL_COMMUNITY): Payer: Self-pay | Admitting: Obstetrics and Gynecology

## 2018-03-11 LAB — CBC
HCT: 36 % (ref 36.0–46.0)
HEMOGLOBIN: 12.1 g/dL (ref 12.0–15.0)
MCH: 31.4 pg (ref 26.0–34.0)
MCHC: 33.6 g/dL (ref 30.0–36.0)
MCV: 93.5 fL (ref 80.0–100.0)
PLATELETS: 262 10*3/uL (ref 150–400)
RBC: 3.85 MIL/uL — AB (ref 3.87–5.11)
RDW: 14.3 % (ref 11.5–15.5)
WBC: 21 10*3/uL — AB (ref 4.0–10.5)
nRBC: 0 % (ref 0.0–0.2)

## 2018-03-11 MED ORDER — OXYCODONE HCL 5 MG PO TABS
5.0000 mg | ORAL_TABLET | Freq: Once | ORAL | Status: AC
  Administered 2018-03-11: 5 mg via ORAL
  Filled 2018-03-11: qty 1

## 2018-03-11 MED ORDER — OXYCODONE HCL 5 MG PO TABS
10.0000 mg | ORAL_TABLET | ORAL | Status: DC | PRN
  Administered 2018-03-11 – 2018-03-12 (×4): 10 mg via ORAL
  Filled 2018-03-11 (×4): qty 2

## 2018-03-11 NOTE — Progress Notes (Signed)
1 Day Post-Op Procedure(s) (LRB): TOTAL ABDOMINAL HYSTERECTOMY WITH BILATERAL SALPINGO-OOPHORECTOMY (Bilateral)  Subjective: Patient reports incisional pain and tolerating PO.    Objective: I have reviewed patient's vital signs, intake and output and labs.  General: alert GI: soft, slightly tender, incision intact  Assessment: s/p Procedure(s): TOTAL ABDOMINAL HYSTERECTOMY WITH BILATERAL SALPINGO-OOPHORECTOMY (Bilateral): stable and progressing well  Plan: Advance diet Encourage ambulation Discontinue IV fluids  D/C foley  LOS: 1 day    Leighton Roach Tyrisha Benninger 03/11/2018, 7:20 AM

## 2018-03-11 NOTE — Progress Notes (Signed)
D/c'd dilaudid PCA at 0800.

## 2018-03-12 MED ORDER — OXYCODONE HCL 5 MG PO TABS: 5.0000 mg | ORAL_TABLET | ORAL | 0 refills | Status: DC | PRN

## 2018-03-12 MED ORDER — IBUPROFEN 600 MG PO TABS: 600.0000 mg | ORAL_TABLET | Freq: Four times a day (QID) | ORAL | 0 refills | Status: DC | PRN

## 2018-03-12 NOTE — Progress Notes (Signed)
POD #2 TAH/BSO Doing well, sore, almost fell this am Afeb, VSS Abd- soft, incision intact Pathology-benign, pt informed D/c home this pm

## 2018-03-12 NOTE — Discharge Instructions (Signed)
Routine instructions for abdominal hysterectomy 

## 2018-03-12 NOTE — Progress Notes (Signed)
Discharge instructions complete with pt. Pt understood all instructions and did not have any questions.

## 2018-03-14 ENCOUNTER — Inpatient Hospital Stay (HOSPITAL_COMMUNITY): Payer: Medicaid Other

## 2018-03-14 ENCOUNTER — Inpatient Hospital Stay (HOSPITAL_COMMUNITY)
Admission: AD | Admit: 2018-03-14 | Discharge: 2018-03-17 | DRG: 205 | Disposition: A | Discharge status: Home / Self Care | Payer: Medicaid Other | Attending: Obstetrics and Gynecology | Admitting: Obstetrics and Gynecology

## 2018-03-14 ENCOUNTER — Encounter (HOSPITAL_COMMUNITY): Payer: Self-pay

## 2018-03-14 DIAGNOSIS — Z90722 Acquired absence of ovaries, bilateral: Secondary | ICD-10-CM

## 2018-03-14 DIAGNOSIS — Z90710 Acquired absence of both cervix and uterus: Secondary | ICD-10-CM

## 2018-03-14 DIAGNOSIS — J189 Pneumonia, unspecified organism: Secondary | ICD-10-CM | POA: Diagnosis present

## 2018-03-14 DIAGNOSIS — J9589 Other postprocedural complications and disorders of respiratory system, not elsewhere classified: Secondary | ICD-10-CM | POA: Diagnosis present

## 2018-03-14 DIAGNOSIS — R109 Unspecified abdominal pain: Secondary | ICD-10-CM | POA: Diagnosis not present

## 2018-03-14 DIAGNOSIS — Z9079 Acquired absence of other genital organ(s): Secondary | ICD-10-CM

## 2018-03-14 DIAGNOSIS — F17210 Nicotine dependence, cigarettes, uncomplicated: Secondary | ICD-10-CM | POA: Diagnosis present

## 2018-03-14 LAB — CBC WITH DIFFERENTIAL/PLATELET
BASOS ABS: 0 10*3/uL (ref 0.0–0.1)
BASOS PCT: 0 %
Eosinophils Absolute: 0.1 10*3/uL (ref 0.0–0.5)
Eosinophils Relative: 0 %
HEMATOCRIT: 38.6 % (ref 36.0–46.0)
Hemoglobin: 12.9 g/dL (ref 12.0–15.0)
Lymphocytes Relative: 11 %
Lymphs Abs: 1.5 10*3/uL (ref 0.7–4.0)
MCH: 30.9 pg (ref 26.0–34.0)
MCHC: 33.4 g/dL (ref 30.0–36.0)
MCV: 92.3 fL (ref 80.0–100.0)
MONOS PCT: 3 %
Monocytes Absolute: 0.4 10*3/uL (ref 0.1–1.0)
NEUTROS ABS: 12.4 10*3/uL — AB (ref 1.7–7.7)
NEUTROS PCT: 86 %
Platelets: 312 10*3/uL (ref 150–400)
RBC: 4.18 MIL/uL (ref 3.87–5.11)
RDW: 13.9 % (ref 11.5–15.5)
WBC: 14.3 10*3/uL — ABNORMAL HIGH (ref 4.0–10.5)
nRBC: 0 % (ref 0.0–0.2)

## 2018-03-14 LAB — URINALYSIS, ROUTINE W REFLEX MICROSCOPIC
Bilirubin Urine: NEGATIVE
GLUCOSE, UA: NEGATIVE mg/dL
Hgb urine dipstick: NEGATIVE
Ketones, ur: NEGATIVE mg/dL
LEUKOCYTES UA: NEGATIVE
NITRITE: NEGATIVE
PROTEIN: NEGATIVE mg/dL
Specific Gravity, Urine: 1.004 — ABNORMAL LOW (ref 1.005–1.030)
pH: 8 (ref 5.0–8.0)

## 2018-03-14 LAB — COMPREHENSIVE METABOLIC PANEL
ALBUMIN: 3.2 g/dL — AB (ref 3.5–5.0)
ALK PHOS: 112 U/L (ref 38–126)
ALT: 56 U/L — AB (ref 0–44)
AST: 36 U/L (ref 15–41)
Anion gap: 11 (ref 5–15)
BUN: 8 mg/dL (ref 6–20)
CHLORIDE: 102 mmol/L (ref 98–111)
CO2: 25 mmol/L (ref 22–32)
CREATININE: 0.62 mg/dL (ref 0.44–1.00)
Calcium: 8.5 mg/dL — ABNORMAL LOW (ref 8.9–10.3)
GFR calc Af Amer: >60 mL/min (ref >60)
GFR calc non Af Amer: >60 mL/min (ref >60)
GLUCOSE: 119 mg/dL — AB (ref 70–99)
Potassium: 3.8 mmol/L (ref 3.5–5.1)
SODIUM: 138 mmol/L (ref 135–145)
Total Bilirubin: 0.4 mg/dL (ref 0.3–1.2)
Total Protein: 6.1 g/dL — ABNORMAL LOW (ref 6.5–8.1)

## 2018-03-14 LAB — INFLUENZA PANEL BY PCR (TYPE A & B)
Influenza A By PCR: NEGATIVE
Influenza B By PCR: NEGATIVE

## 2018-03-14 LAB — LACTIC ACID, PLASMA: Lactic Acid, Venous: 1 mmol/L (ref 0.5–1.9)

## 2018-03-14 MED ORDER — ALPRAZOLAM 1 MG PO TABS
2.0000 mg | ORAL_TABLET | Freq: Two times a day (BID) | ORAL | Status: DC
  Administered 2018-03-14 – 2018-03-16 (×5): 2 mg via ORAL
  Filled 2018-03-14 (×5): qty 2

## 2018-03-14 MED ORDER — ALPRAZOLAM 0.5 MG PO TABS: 0.5000 mg | ORAL_TABLET | Freq: Two times a day (BID) | ORAL | Status: DC

## 2018-03-14 MED ORDER — LACTATED RINGERS IV SOLN
INTRAVENOUS | Status: DC
  Administered 2018-03-14: 12:00:00 via INTRAVENOUS

## 2018-03-14 MED ORDER — FLUOXETINE HCL 20 MG PO CAPS
60.0000 mg | ORAL_CAPSULE | Freq: Every day | ORAL | Status: DC
  Administered 2018-03-14: 60 mg via ORAL
  Filled 2018-03-14 (×2): qty 3

## 2018-03-14 MED ORDER — KETOROLAC TROMETHAMINE 30 MG/ML IJ SOLN
30.0000 mg | Freq: Once | INTRAMUSCULAR | Status: AC
  Administered 2018-03-14: 30 mg via INTRAVENOUS
  Filled 2018-03-14: qty 1

## 2018-03-14 MED ORDER — OXYCODONE HCL 5 MG PO TABS: 5.0000 mg | ORAL_TABLET | ORAL | Status: DC | PRN

## 2018-03-14 MED ORDER — IOPAMIDOL (ISOVUE-300) INJECTION 61%
100.0000 mL | Freq: Once | INTRAVENOUS | Status: AC | PRN
  Administered 2018-03-14: 100 mL via INTRAVENOUS

## 2018-03-14 MED ORDER — LACTATED RINGERS IV BOLUS
1000.0000 mL | Freq: Once | INTRAVENOUS | Status: AC
  Administered 2018-03-14: 1000 mL via INTRAVENOUS

## 2018-03-14 MED ORDER — ALPRAZOLAM ER 1 MG PO TB24: 1.0000 mg | ORAL_TABLET | Freq: Every day | ORAL | Status: DC

## 2018-03-14 MED ORDER — ACETAMINOPHEN 325 MG PO TABS: 650.0000 mg | ORAL_TABLET | ORAL | Status: DC | PRN

## 2018-03-14 MED ORDER — IBUPROFEN 600 MG PO TABS
600.0000 mg | ORAL_TABLET | Freq: Four times a day (QID) | ORAL | Status: DC | PRN
  Administered 2018-03-15: 600 mg via ORAL
  Filled 2018-03-14: qty 1

## 2018-03-14 MED ORDER — ALPRAZOLAM 0.5 MG PO TABS
0.5000 mg | ORAL_TABLET | Freq: Two times a day (BID) | ORAL | Status: DC
  Administered 2018-03-14 – 2018-03-17 (×6): 0.5 mg via ORAL
  Filled 2018-03-14 (×6): qty 1

## 2018-03-14 MED ORDER — OXYCODONE HCL 5 MG PO TABS
10.0000 mg | ORAL_TABLET | Freq: Four times a day (QID) | ORAL | Status: DC | PRN
  Administered 2018-03-14 – 2018-03-16 (×4): 10 mg via ORAL
  Filled 2018-03-14 (×4): qty 2

## 2018-03-14 MED ORDER — DOXEPIN HCL 50 MG PO CAPS
100.0000 mg | ORAL_CAPSULE | Freq: Every day | ORAL | Status: DC
  Administered 2018-03-14 – 2018-03-16 (×3): 100 mg via ORAL
  Filled 2018-03-14 (×3): qty 2

## 2018-03-14 MED ORDER — IPRATROPIUM-ALBUTEROL 0.5-2.5 (3) MG/3ML IN SOLN
3.0000 mL | Freq: Once | RESPIRATORY_TRACT | Status: AC
  Administered 2018-03-14: 3 mL via RESPIRATORY_TRACT
  Filled 2018-03-14: qty 3

## 2018-03-14 MED ORDER — SODIUM CHLORIDE 0.9 % IV SOLN
2.0000 g | INTRAVENOUS | Status: DC
  Administered 2018-03-14 – 2018-03-16 (×3): 2 g via INTRAVENOUS
  Filled 2018-03-14 (×2): qty 20
  Filled 2018-03-14: qty 2

## 2018-03-14 MED ORDER — SODIUM CHLORIDE 0.9 % IV SOLN
INTRAVENOUS | Status: DC
  Administered 2018-03-14 – 2018-03-15 (×2): via INTRAVENOUS

## 2018-03-14 MED ORDER — IPRATROPIUM-ALBUTEROL 0.5-2.5 (3) MG/3ML IN SOLN
3.0000 mL | Freq: Three times a day (TID) | RESPIRATORY_TRACT | Status: DC | PRN
  Administered 2018-03-15 (×2): 3 mL via RESPIRATORY_TRACT
  Filled 2018-03-14 (×3): qty 3

## 2018-03-14 NOTE — MAU Provider Note (Signed)
Chief Complaint: Abdominal Pain; Fever; and Chills   First Provider Initiated Contact with Patient 03/14/18 1130     SUBJECTIVE HPI: Diane Stein is a 32 y.o. non pregnant female who presents to [facility] reporting abdominal pain, fever/chills, and cough. Patient is 4 days s/p TAH/BSO. Had flu shot prior to being discharged from the hospital.  Abdominal pain had been well controlled with her pain meds until this morning. Has not taking pain medication since yesterday. Woke up this morning with pain deep under her incision. Also had fever this morning of 102. Took 2 ES tylenol at 0930. Denies vomiting, diarrhea, constipation, foul discharge, or dysuria. Had normal BM yesterday. No vaginal bleeding.  Has had cough since d/c from hospital. States some production of clear mucous. Occasionally feels SOB. No chest pain. No calf pain. No sore throat or ear pain.  Location: lower abdomen Quality: sore/cramping Severity: 8/10 on pain scale Duration: 3 hours Timing: constant Modifying factors: nothing makes better or worse. Not improved with tylenol Associated signs and symptoms: fever/chills, cough  Past Medical History:  Diagnosis Date  . Anxiety   . Asthma    as a child  . Bronchitis    history  . Depression   . Dysrhythmia    rapid heartrate  . Pneumonia 2014   History  . Smoker    0.5 ppd x 10 yrs  . SVD (spontaneous vaginal delivery)    x 1  . Tachycardia 2016   OB History  Gravida Para Term Preterm AB Living  5 1 1   3 1   SAB TAB Ectopic Multiple Live Births  3     0 1    # Outcome Date GA Lbr Len/2nd Weight Sex Delivery Anes PTL Lv  5 Term 11/08/14 [redacted]w[redacted]d 26:00 / 04:07 3390 g F Vag-Vacuum EPI  LIV  4 SAB 2015          3 SAB 2014          2 SAB 2014          1 Gravida            Past Surgical History:  Procedure Laterality Date  . HYSTERECTOMY ABDOMINAL WITH SALPINGO-OOPHORECTOMY Bilateral 03/10/2018   Procedure: TOTAL ABDOMINAL HYSTERECTOMY WITH  BILATERAL SALPINGO-OOPHORECTOMY;  Surgeon: Lavina Hamman, MD;  Location: [facility];  Service: Gynecology;  Laterality: Bilateral;  . LAPAROSCOPIC UNILATERAL SALPINGECTOMY Left 08/28/2017   Procedure: LAPAROSCOPIC UNILATERAL SALPINGECTOMY WITH REMOVAL OF PARA TUBAL CYST;  Surgeon: Lavina Hamman, MD;  Location: [facility];  Service: Gynecology;  Laterality: Left;  . LAPAROSCOPY Left 08/28/2017   Procedure: LAPAROSCOPY OPERATIVE, FULGERATION OF ENDOMETRIOSIS;  Surgeon: Lavina Hamman, MD;  Location: [facility];  Service: Gynecology;  Laterality: Left;  . OVARIAN CYST SURGERY    . TOOTH EXTRACTION Right 2008   Social History   Socioeconomic History  . Marital status: Single    Spouse name: Not on file  . Number of children: 1  . Years of education: Not on file  . Highest education level: Not on file  Occupational History  . Not on file  Social Needs  . Financial resource strain: Not on file  . Food insecurity:    Worry: Not on file    Inability: Not on file  . Transportation needs:    Medical: Not on file    Non-medical: Not on file  Tobacco Use  . Smoking status: Current Every Day Smoker    Packs/day: 0.50    Years: 10.00  Pack years: 5.00    Types: Cigarettes  . Smokeless tobacco: Never Used  Substance and Sexual Activity  . Alcohol use: Yes    Alcohol/week: 5.0 standard drinks    Types: 5 Glasses of wine per week  . Drug use: No  . Sexual activity: Yes    Birth control/protection: Pill  Lifestyle  . Physical activity:    Days per week: Not on file    Minutes per session: Not on file  . Stress: Not on file  Relationships  . Social connections:    Talks on phone: Not on file    Gets together: Not on file    Attends religious service: Not on file    Active member of club or organization: Not on file    Attends meetings of clubs or organizations: Not on file    Relationship status: Not on file  . Intimate partner violence:    Fear of current or ex partner: Not on file     Emotionally abused: Not on file    Physically abused: Not on file    Forced sexual activity: Not on file  Other Topics Concern  . Not on file  Social History Narrative  . Not on file   Family History  Problem Relation Age of Onset  . Hypertension Mother   . Heart disease Father   . Heart disease Maternal Grandmother   . Hypertension Maternal Grandmother   . Heart disease Maternal Grandfather   . Hypertension Maternal Grandfather   . Heart disease Paternal Grandmother   . Hypertension Paternal Grandmother   . Heart disease Paternal Grandfather   . Hypertension Paternal Grandfather    No current facility-administered medications on file prior to encounter.    Current Outpatient Medications on File Prior to Encounter  Medication Sig Dispense Refill  . acetaminophen (TYLENOL) 325 MG tablet Take 650 mg by mouth every 6 (six) hours as needed (for pain or headaches).    . ALPRAZolam (XANAX XR) 1 MG 24 hr tablet Take 1 mg by mouth daily.    Marland Kitchen alprazolam (XANAX) 2 MG tablet Take 2 mg by mouth 2 (two) times daily.    . cyclobenzaprine (FLEXERIL) 10 MG tablet Take 10 mg by mouth 3 (three) times daily as needed for muscle spasms.    Marland Kitchen doxepin (SINEQUAN) 50 MG capsule Take 50-100 mg by mouth at bedtime.    Marland Kitchen EPINEPHrine 0.3 mg/0.3 mL IJ SOAJ injection Inject 0.3 mLs (0.3 mg total) into the muscle once as needed for up to 1 dose (anaphalaxis). 1 Device 0  . FLUoxetine HCl 60 MG TABS Take 60 mg by mouth daily.   1  . ibuprofen (ADVIL,MOTRIN) 600 MG tablet Take 1 tablet (600 mg total) by mouth every 6 (six) hours as needed (mild pain). 30 tablet 0  . oxyCODONE (ROXICODONE) 5 MG immediate release tablet Take 1 tablet (5 mg total) by mouth every 4 (four) hours as needed for severe pain. 30 tablet 0   Allergies  Allergen Reactions  . Avocado Hives    Shortness of breath and dizziness  . Other Hives    Pine Nuts = HIVES Avacados = HIVES  . Benadryl [Diphenhydramine Hcl] Other (See Comments)     Restless legs result if this is taken  . Benadryl [Diphenhydramine]     Restless leg syndrome    I have reviewed patient's Past Medical Hx, Surgical Hx, Family Hx, Social Hx, medications and allergies.   Review of Systems  Constitutional:  Positive for chills and fever.  HENT: Negative for congestion.   Respiratory: Positive for cough and shortness of breath. Negative for wheezing.   Cardiovascular: Positive for leg swelling. Negative for chest pain and palpitations.  Gastrointestinal: Positive for abdominal pain and nausea (x 1 episode last night). Negative for constipation, diarrhea and vomiting.  Genitourinary: Negative.   Musculoskeletal: Positive for myalgias.    OBJECTIVE Patient Vitals for the past 24 hrs:  BP Temp Temp src Pulse Resp SpO2 Weight  03/14/18 1620 - - - - - 97 % -  03/14/18 1537 - - - - - 97 % -  03/14/18 1529 - - - - - 93 % -  03/14/18 1525 - - - - - (!) 89 % -  03/14/18 1503 113/69 98.4 F (36.9 C) Oral 98 18 90 % -  03/14/18 1244 - - - 98 - - -  03/14/18 1215 - - - - - 94 % -  03/14/18 1101 112/76 98.5 F (36.9 C) Oral (!) 126 20 - 78.5 kg   Constitutional: Well-developed, well-nourished female in no acute distress.  Cardiovascular: normal rate & rhythm, no murmur Respiratory: normal rate and effort. Lung sounds clear throughout GI: Honeycomb dressing clean & intact. TTP throughout lower abdomen. BS x 2. Abdomen soft. No guarding or rebound.  MS: Extremities nontender, Mild BLE edema, normal ROM Neurologic: Alert and oriented x 4.  GU:  No blood. No discharge.    LAB RESULTS Results for orders placed or performed during the hospital encounter of 03/14/18 (from the past 24 hour(s))  Urinalysis, Routine w reflex microscopic     Status: Abnormal   Collection Time: 03/14/18 11:06 AM  Result Value Ref Range   Color, Urine STRAW (A) YELLOW   APPearance CLEAR CLEAR   Specific Gravity, Urine 1.004 (L) 1.005 - 1.030   pH 8.0 5.0 - 8.0   Glucose, UA  NEGATIVE NEGATIVE mg/dL   Hgb urine dipstick NEGATIVE NEGATIVE   Bilirubin Urine NEGATIVE NEGATIVE   Ketones, ur NEGATIVE NEGATIVE mg/dL   Protein, ur NEGATIVE NEGATIVE mg/dL   Nitrite NEGATIVE NEGATIVE   Leukocytes, UA NEGATIVE NEGATIVE  CBC with Differential/Platelet     Status: Abnormal   Collection Time: 03/14/18 11:20 AM  Result Value Ref Range   WBC 14.3 (H) 4.0 - 10.5 K/uL   RBC 4.18 3.87 - 5.11 MIL/uL   Hemoglobin 12.9 12.0 - 15.0 g/dL   HCT 16.1 09.6 - 04.5 %   MCV 92.3 80.0 - 100.0 fL   MCH 30.9 26.0 - 34.0 pg   MCHC 33.4 30.0 - 36.0 g/dL   RDW 40.9 81.1 - 91.4 %   Platelets 312 150 - 400 K/uL   nRBC 0.0 0.0 - 0.2 %   Neutrophils Relative % 86 %   Neutro Abs 12.4 (H) 1.7 - 7.7 K/uL   Lymphocytes Relative 11 %   Lymphs Abs 1.5 0.7 - 4.0 K/uL   Monocytes Relative 3 %   Monocytes Absolute 0.4 0.1 - 1.0 K/uL   Eosinophils Relative 0 %   Eosinophils Absolute 0.1 0.0 - 0.5 K/uL   Basophils Relative 0 %   Basophils Absolute 0.0 0.0 - 0.1 K/uL  Comprehensive metabolic panel     Status: Abnormal   Collection Time: 03/14/18 11:20 AM  Result Value Ref Range   Sodium 138 135 - 145 mmol/L   Potassium 3.8 3.5 - 5.1 mmol/L   Chloride 102 98 - 111 mmol/L   CO2 25 22 -  32 mmol/L   Glucose, Bld 119 (H) 70 - 99 mg/dL   BUN 8 6 - 20 mg/dL   Creatinine, Ser 1.61 0.44 - 1.00 mg/dL   Calcium 8.5 (L) 8.9 - 10.3 mg/dL   Total Protein 6.1 (L) 6.5 - 8.1 g/dL   Albumin 3.2 (L) 3.5 - 5.0 g/dL   AST 36 15 - 41 U/L   ALT 56 (H) 0 - 44 U/L   Alkaline Phosphatase 112 38 - 126 U/L   Total Bilirubin 0.4 0.3 - 1.2 mg/dL   GFR calc non Af Amer >60 >60 mL/min   GFR calc Af Amer >60 >60 mL/min   Anion gap 11 5 - 15  Lactic acid, plasma     Status: None   Collection Time: 03/14/18 11:20 AM  Result Value Ref Range   Lactic Acid, Venous 1.0 0.5 - 1.9 mmol/L  Influenza panel by PCR (type A & B)     Status: None   Collection Time: 03/14/18 11:54 AM  Result Value Ref Range   Influenza A By PCR  NEGATIVE NEGATIVE   Influenza B By PCR NEGATIVE NEGATIVE    IMAGING Dg Chest 2 View  Result Date: 03/14/2018 CLINICAL DATA:  32 year old female with a history hysterectomy. Cough and shortness of breath. EXAM: CHEST - 2 VIEW COMPARISON:  01/09/2018, 10/30/2017 FINDINGS: Cardiomediastinal silhouette unchanged in size and contour. Interval development of patchy airspace opacities in the right mid and lower lung as well as at the left lung base. Airspace opacity at the posterior lung base and overlying the heart saddle on the lateral view. Thickening of the fissures on the lateral view. No pneumothorax. No large pleural effusion. No displaced fracture. IMPRESSION: Multifocal pneumonia. Electronically Signed   By: Gilmer Mor D.O.   On: 03/14/2018 16:09   Ct Abdomen Pelvis W Contrast  Result Date: 03/14/2018 CLINICAL DATA:  Acute generalized abdominal pain. EXAM: CT ABDOMEN AND PELVIS WITH CONTRAST TECHNIQUE: Multidetector CT imaging of the abdomen and pelvis was performed using the standard protocol following bolus administration of intravenous contrast. CONTRAST:  ISOVUE-300 IOPAMIDOL (ISOVUE-300) INJECTION 61% COMPARISON:  CT scan of October 24, 2016. FINDINGS: Lower chest: Minimal bibasilar subsegmental atelectasis is noted. Hepatobiliary: No focal liver abnormality is seen. No gallstones, gallbladder wall thickening, or biliary dilatation. Pancreas: Unremarkable. No pancreatic ductal dilatation or surrounding inflammatory changes. Spleen: Normal in size without focal abnormality. Adrenals/Urinary Tract: Adrenal glands are unremarkable. Kidneys are normal, without renal calculi, focal lesion, or hydronephrosis. Bladder is unremarkable. Stomach/Bowel: Stomach is within normal limits. Appendix appears normal. No evidence of bowel wall thickening, distention, or inflammatory changes. Vascular/Lymphatic: No significant vascular findings are present. No enlarged abdominal or pelvic lymph nodes.  Reproductive: Status post hysterectomy. No adnexal masses. Other: Postoperative changes are seen in the anterior pelvic wall consistent with recent hysterectomy. No hernia or abnormal fluid collection is noted. Musculoskeletal: No acute or significant osseous findings. IMPRESSION: Expected postoperative changes are noted consistent with recent hysterectomy. No other significant abnormality seen in the abdomen or pelvis. Electronically Signed   By: Lupita Raider, M.D.   On: 03/14/2018 15:25    MAU COURSE Orders Placed This Encounter  Procedures  . CT ABDOMEN PELVIS W CONTRAST  . DG Chest 2 View  . Urinalysis, Routine w reflex microscopic  . CBC with Differential/Platelet  . Comprehensive metabolic panel  . Lactic acid, plasma  . Influenza panel by PCR (type A & B)  . Oxygen therapy Mode or (Route): Nasal  cannula; Keep 02 saturation: >94  . Admit to Inpatient (patient's expected length of stay will be greater than 2 midnights or inpatient only procedure)   Meds ordered this encounter  Medications  . FOLLOWED BY Linked Order Group   . lactated ringers bolus 1,000 mL   . lactated ringers infusion  . ketorolac (TORADOL) 30 MG/ML injection 30 mg  . iopamidol (ISOVUE-300) 61 % injection 100 mL  . ipratropium-albuterol (DUONEB) 0.5-2.5 (3) MG/3ML nebulizer solution 3 mL    MDM On intake, afebrile but tachycardic. IV fluids started. CBC, CMP, & lactic acid collected. CBC shows leukocytosis with left shift. Down from 3 days ago when WBC 21k (differential not done).  Toradol IV for pain -- reports no improvement however is sleeping in room.  Flu swab negative. CT - negative abdominal exam. Shows some atelectasis in lower lobes. Back from CT, reports cough/congestion worsening. O2 sats 88-92% on room air. On initial exam Lung sounds were clear throughout. Repeat exam = crackles in lower lobes. O2 by [place] to keep sats >94.  Care of patient in conjunction with Dr. Senaida Ores. Reviewed CT results &  updated condition. Will get chest xray & respiratory to assess for duoneb tx.   ASSESSMENT 1. Post-op pneumonia   2. S/P TAH-BSO (total abdominal hysterectomy and bilateral salpingo-oophorectomy)    Plan: Admit  IV abx for post op pneumonia Orders placed Patient agreeable with plan  Judeth Horn, NP 03/14/2018  4:31 PM

## 2018-03-14 NOTE — MAU Provider Note (Signed)
History     CSN: [id number]  Arrival date and time: 03/14/18 1048   First Provider Initiated Contact with Patient 03/14/18 1130      Chief Complaint  Patient presents with  . Abdominal Pain  . Fever  . Chills   Pt is s/p TAH on 03/10/18 with an uncomplicated surgical and postoperative course who presents at my advice when her mother called to report temp of 102.6 and increased abdominal pain. Pt states went home and was gradually improving until yesterday PM when she had some increased nausea, but no emesis.  Went to bed and woke with fever and chills this AM.  Reports no nausea now and no emesis at all.  She was taking oxycodone yesterday every 6 hours or so, but has had no pain meds since yesterday PM other than tylenol at 930 this AM.  She has not eaten today.  She does endorse new cough and congestion that started yesterday as well.  Abdominal Pain  Associated symptoms include a fever.  Fever   Associated symptoms include abdominal pain.      Past Medical History:  Diagnosis Date  . Anxiety   . 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  Patient does not have an inhaler  . Bronchitis    history  . Depression   . Dysrhythmia    rapid heartrate  . Pneumonia 2014   History  . Smoker    0.5 ppd x 10 yrs  . SVD (spontaneous vaginal delivery)    x 1  . Tachycardia 2016    Past Surgical History:  Procedure Laterality Date  . HYSTERECTOMY ABDOMINAL WITH SALPINGO-OOPHORECTOMY Bilateral 03/10/2018   Procedure: TOTAL ABDOMINAL HYSTERECTOMY WITH BILATERAL SALPINGO-OOPHORECTOMY;  Surgeon: Lavina Hamman, MD;  Location: [facility];  Service: Gynecology;  Laterality: Bilateral;  . LAPAROSCOPIC UNILATERAL SALPINGECTOMY Left 08/28/2017   Procedure: LAPAROSCOPIC UNILATERAL SALPINGECTOMY WITH REMOVAL OF PARA TUBAL CYST;  Surgeon: Lavina Hamman, MD;  Location: [facility];  Service: Gynecology;  Laterality: Left;  . LAPAROSCOPY Left  08/28/2017   Procedure: LAPAROSCOPY OPERATIVE, FULGERATION OF ENDOMETRIOSIS;  Surgeon: Lavina Hamman, MD;  Location: [facility];  Service: Gynecology;  Laterality: Left;  . OVARIAN CYST SURGERY    . TOOTH EXTRACTION Right 2008    Family History  Problem Relation Age of Onset  . Hypertension Mother   . Heart disease Father   . Heart disease Maternal Grandmother   . Hypertension Maternal Grandmother   . Heart disease Maternal Grandfather   . Hypertension Maternal Grandfather   . Heart disease Paternal Grandmother   . Hypertension Paternal Grandmother   . Heart disease Paternal Grandfather   . Hypertension Paternal Grandfather     Social History   Tobacco Use  . Smoking status: Current Every Day Smoker    Packs/day: 0.50    Years: 10.00    Pack years: 5.00    Types: Cigarettes  . Smokeless tobacco: Never Used  Substance Use Topics  . Alcohol use: Yes    Alcohol/week: 5.0 standard drinks    Types: 5 Glasses of wine per week  . Drug use: No    Allergies:  Allergies  Allergen Reactions  . Avocado Hives    Shortness of breath and dizziness  . Other Hives    Pine Nuts = HIVES Avacados = HIVES  . Benadryl [Diphenhydramine Hcl] Other (See Comments)    Restless legs result if this is taken  .  Benadryl [Diphenhydramine]     Restless leg syndrome    Medications Prior to Admission  Medication Sig Dispense Refill Last Dose  . acetaminophen (TYLENOL) 325 MG tablet Take 650 mg by mouth every 6 (six) hours as needed (for pain or headaches).   Past Month at Unknown time  . ALPRAZolam (XANAX XR) 1 MG 24 hr tablet Take 1 mg by mouth daily.   03/09/2018 at Unknown time  . alprazolam (XANAX) 2 MG tablet Take 2 mg by mouth 2 (two) times daily.   03/09/2018 at Unknown time  . cyclobenzaprine (FLEXERIL) 10 MG tablet Take 10 mg by mouth 3 (three) times daily as needed for muscle spasms.   Past Week at Unknown time  . doxepin (SINEQUAN) 50 MG capsule Take 50-100 mg by mouth at bedtime.    03/09/2018 at Unknown time  . EPINEPHrine 0.3 mg/0.3 mL IJ SOAJ injection Inject 0.3 mLs (0.3 mg total) into the muscle once as needed for up to 1 dose (anaphalaxis). 1 Device 0 More than a month at Unknown time  . FLUoxetine HCl 60 MG TABS Take 60 mg by mouth daily.   1 03/09/2018 at Unknown time  . ibuprofen (ADVIL,MOTRIN) 600 MG tablet Take 1 tablet (600 mg total) by mouth every 6 (six) hours as needed (mild pain). 30 tablet 0   . oxyCODONE (ROXICODONE) 5 MG immediate release tablet Take 1 tablet (5 mg total) by mouth every 4 (four) hours as needed for severe pain. 30 tablet 0     Review of Systems  Constitutional: Positive for fever.  Gastrointestinal: Positive for abdominal pain.   Physical Exam   Blood pressure 112/76, pulse (!) 126, temperature 98.5 F (36.9 C), temperature source Oral, resp. rate 20, weight 78.5 kg.  Physical Exam  Constitutional: She appears well-developed and well-nourished.  Cardiovascular: Regular rhythm.  Slightly tachycardic  Respiratory: Effort normal.  GI: Soft. There is tenderness (mild tenderness to palpation, Incisiion clear and well-approximated with no erythema). There is no rebound.  Neurological: She is alert.  Psychiatric: She has a normal mood and affect.   CBC 14K, down from 21K at discharge    Assessment and Plan  Pt with fever and abdominal tenderness s/p TAH 4 days ago.  Her abdomen does not appear acute, but6 given timing post-surgery I think we need to exclude any injury to bowel or bladder with CT scan.   I will give some IV toradol for pain while she gets CT scan, it may be her increased discomfort this AM is from not having pain meds overnight.  Also swabbed for flu given cough and congestion.  [name] 03/14/2018, 12:26 PM

## 2018-03-14 NOTE — MAU Note (Signed)
Had hysterectomy on Wednesday and then woke up this AM with increased pain  States pain is where the incision is  Temp @ home this morning was 102.6 and took tylenol  Reports chills

## 2018-03-14 NOTE — Progress Notes (Signed)
Patient ID: Diane Stein, female   DOB: 12/28/1985, 32 y.o.   MRN: [id number] Pt with negative CT scan and CXR positive for multifocal pneumonia.  Her 02 sats got down to 89-90% on RA so was placed on 2L [place] 02.  She received a breathing treatment and immediately improved to 97% on RA.   Admitted and placed on Rocephin.  Temp is still improved. Will observe overnight for any recurrent desaturation events or fever.  CBC in AM to trend WBC.

## 2018-03-15 LAB — CBC WITH DIFFERENTIAL/PLATELET
BASOS PCT: 0 %
Basophils Absolute: 0 10*3/uL (ref 0.0–0.1)
EOS PCT: 1 %
Eosinophils Absolute: 0.1 10*3/uL (ref 0.0–0.5)
HCT: 32.1 % — ABNORMAL LOW (ref 36.0–46.0)
Hemoglobin: 10.7 g/dL — ABNORMAL LOW (ref 12.0–15.0)
Lymphocytes Relative: 21 %
Lymphs Abs: 2.1 10*3/uL (ref 0.7–4.0)
MCH: 31.2 pg (ref 26.0–34.0)
MCHC: 33.3 g/dL (ref 30.0–36.0)
MCV: 93.6 fL (ref 80.0–100.0)
MONO ABS: 0.5 10*3/uL (ref 0.1–1.0)
Monocytes Relative: 5 %
Neutro Abs: 7.2 10*3/uL (ref 1.7–7.7)
Neutrophils Relative %: 73 %
PLATELETS: 244 10*3/uL (ref 150–400)
RBC: 3.43 MIL/uL — AB (ref 3.87–5.11)
RDW: 14.4 % (ref 11.5–15.5)
WBC: 9.9 10*3/uL (ref 4.0–10.5)
nRBC: 0 % (ref 0.0–0.2)

## 2018-03-15 MED ORDER — AZITHROMYCIN 250 MG PO TABS
500.0000 mg | ORAL_TABLET | Freq: Every day | ORAL | Status: AC
  Administered 2018-03-15: 500 mg via ORAL
  Filled 2018-03-15: qty 2

## 2018-03-15 MED ORDER — DM-GUAIFENESIN ER 30-600 MG PO TB12
1.0000 | ORAL_TABLET | Freq: Two times a day (BID) | ORAL | Status: DC
  Administered 2018-03-15 – 2018-03-17 (×5): 1 via ORAL
  Filled 2018-03-15 (×5): qty 1

## 2018-03-15 MED ORDER — AZITHROMYCIN 250 MG PO TABS
250.0000 mg | ORAL_TABLET | Freq: Every day | ORAL | Status: DC
  Administered 2018-03-16 – 2018-03-17 (×2): 250 mg via ORAL
  Filled 2018-03-15 (×2): qty 1

## 2018-03-15 NOTE — Progress Notes (Signed)
HD #2 pneumonia, POD #5 TAH/BSO Still SOB, sore from coughing Afeb since admission, VSS Abd- soft, incision intact Will continue Rocephin for pneumonia, add Zithromax, try Mucinex DM for cough

## 2018-03-15 NOTE — Progress Notes (Signed)
Still feels slightly SOB, better than yesterday Afeb, VSS Will keep overnight, gets 2nd dose of rocephin tonight, also on zithromax.  Plan for discharge tomorrow

## 2018-03-15 NOTE — Psychosocial Assessment (Addendum)
Patient's O2 Sat. = 87% RA while sleeping.  O2 administered at 2L/ [place] and encouraged incentive spirometer use.  Patient with unproductive cough.  RT requested to administer breathing treatment.  O2 Saturation improved after interventions. Will continue to observe.

## 2018-03-15 NOTE — Discharge Summary (Signed)
Physician Discharge Summary  Patient ID: Diane Stein MRN: [id number] DOB/AGE: 1986-03-27 31 y.o.  Admit date: 03/10/2018 Discharge date: 03/13/2019  Admission Diagnoses:  Chronic pelvic pain  Discharge Diagnoses: Same Active Problems:   S/P TAH-BSO (total abdominal hysterectomy and bilateral salpingo-oophorectomy)   Discharged Condition: good  Hospital Course: Pt admitted for TAH/BSO for chronic pain.  Surgery without complications.  She had some issues with post-op pain control, but was stable for discharge on POD #2   Discharge Exam: Blood pressure 112/60, pulse (!) 110, temperature 98.1 F (36.7 C), temperature source Oral, resp. rate 18, height 5\' 3"  (1.6 m), weight 72.3 kg, SpO2 96 %. General appearance: alert  Disposition:   Discharge Instructions    Call MD for:  persistant dizziness or light-headedness   Complete by:  As directed    Call MD for:  persistant nausea and vomiting   Complete by:  As directed    Call MD for:  redness, tenderness, or signs of infection (pain, swelling, redness, odor or green/yellow discharge around incision site)   Complete by:  As directed    Call MD for:  severe uncontrolled pain   Complete by:  As directed    Call MD for:  temperature >100.4   Complete by:  As directed    Diet - low sodium heart healthy   Complete by:  As directed    Increase activity slowly   Complete by:  As directed    Lifting restrictions   Complete by:  As directed    10 lbs   Sexual Activity Restrictions   Complete by:  As directed    Pelvic rest     Allergies as of 03/12/2018      Reactions   Avocado Hives   Shortness of breath and dizziness   Other Hives   Pine Nuts = HIVES Avacados = HIVES   Benadryl [diphenhydramine Hcl] Other (See Comments)   Restless legs result if this is taken   Benadryl [diphenhydramine]    Restless leg syndrome      Medication List    STOP taking these medications   LOESTRIN 1.5/30 (21) PO     TAKE these  medications   acetaminophen 325 MG tablet Commonly known as:  TYLENOL Take 650 mg by mouth every 6 (six) hours as needed (for pain or headaches).   ALPRAZolam 1 MG 24 hr tablet Commonly known as:  XANAX XR Take 1 mg by mouth daily. Takes first thing in the morning   alprazolam 2 MG tablet Commonly known as:  XANAX Take 2 mg by mouth 2 (two) times daily. Takes 1st dose at 82 Noon and second dose a few hours later   cyclobenzaprine 10 MG tablet Commonly known as:  FLEXERIL Take 10 mg by mouth 3 (three) times daily as needed for muscle spasms.   doxepin 50 MG capsule Commonly known as:  SINEQUAN Take 100 mg by mouth at bedtime.   EPINEPHrine 0.3 mg/0.3 mL Soaj injection Commonly known as:  EPI-PEN Inject 0.3 mLs (0.3 mg total) into the muscle once as needed for up to 1 dose (anaphalaxis).   ibuprofen 600 MG tablet Commonly known as:  ADVIL,MOTRIN Take 1 tablet (600 mg total) by mouth every 6 (six) hours as needed (mild pain).   oxyCODONE 5 MG immediate release tablet Commonly known as:  Oxy IR/ROXICODONE Take 1 tablet (5 mg total) by mouth every 4 (four) hours as needed for severe pain.      Follow-up Information  Arantza Darrington, MD. Schedule an appointment as soon as possible for a visit in 2 week(s).   Specialty:  Obstetrics and Gynecology Why:  for incision check Contact information: [address], [address] [place] [address] [phone]           Signed: Leighton Roach Kalise Fickett 03/15/2018, 8:03 AM

## 2018-03-16 MED ORDER — FLUOXETINE HCL 20 MG PO CAPS
60.0000 mg | ORAL_CAPSULE | Freq: Every day | ORAL | Status: DC
  Administered 2018-03-16 – 2018-03-17 (×2): 60 mg via ORAL
  Filled 2018-03-16 (×2): qty 3

## 2018-03-16 MED ORDER — MENTHOL 3 MG MT LOZG
1.0000 | LOZENGE | OROMUCOSAL | Status: DC | PRN
  Administered 2018-03-16: 3 mg via ORAL
  Filled 2018-03-16: qty 9

## 2018-03-16 NOTE — Progress Notes (Addendum)
Patient ID: Diane Stein, female   DOB: 06/27/1985, 32 y.o.   MRN: [id number]   POD # 6 TAH/BSO HD # 3 PO PNA  Had a good day.  No N/V, nl bowel and bladder function  AF since 23:30pm (Tm = 102.7) VSS gen NAD Abd soft, NT, ND, +BS Inc C/D/I  Continue current care - Rocephin and Zithromax

## 2018-03-16 NOTE — Progress Notes (Signed)
HD #3 PO pneumonia, POD #6 TAH/BSO Not much different, still coughing, still SOB Tmax 102.7 at 2330, VSS Still visibly SOB Abd- soft, incision intact Continue Rocephin and Zithromax, monitor temp, will consult ID if spikes temp again

## 2018-03-17 MED ORDER — CEFIXIME 400 MG PO CAPS: 400.0000 mg | ORAL_CAPSULE | Freq: Every day | ORAL | 0 refills | Status: DC

## 2018-03-17 MED ORDER — AZITHROMYCIN 250 MG PO TABS: ORAL_TABLET | ORAL | 0 refills | Status: DC

## 2018-03-17 NOTE — Progress Notes (Signed)
Pt discharged to home with family member.  Condition stable.  Pt ambulated to car with Dolphus Jenny, RN.  No equipment for home ordered at discharge.

## 2018-03-17 NOTE — Discharge Instructions (Signed)
Call for fever, increasing difficulty breathing

## 2018-03-17 NOTE — Progress Notes (Signed)
HD #4 pneumonia, POD #7 TAH/BSO Feeling better today Afeb, VSS No difficulty breathing Abd- soft, NT, incision intact D/c home

## 2018-03-18 NOTE — Discharge Summary (Signed)
Physician Discharge Summary  Patient ID: Diane Stein MRN: [id number] DOB/AGE: 1985-05-27 31 y.o.  Admit date: 03/14/2018 Discharge date: 03/18/2019  Admission Diagnoses:  Pneumonia  Discharge Diagnoses: Pneumonia Active Problems:   Post-op pneumonia   Discharged Condition: good  Hospital Course: Pt evaluated and admitted by Dr. Senaida Ores, 3 days s/p TAH/BSO now with increasing SOB and pain.  WBC elevated, had temp of 102 at home, CXR c/w pneumonia, pelvic CT scan normal.  She was admitted and started on Rocephin.  Zithromax was also added.  She had another temp to 102 after about 24 hrs of admission, remained afebrile for the remainder of her hospital course.  On HD #3, her breathing was improved and she was felt to be stable for discharge home   Discharge Exam: Blood pressure (!) 94/47, pulse (!) 108, temperature 99.7 F (37.6 C), temperature source Oral, resp. rate 18, weight 78.5 kg, SpO2 96 %. General appearance: alert  Disposition:   Discharge Instructions    Call MD for:  difficulty breathing, headache or visual disturbances   Complete by:  As directed    Call MD for:  redness, tenderness, or signs of infection (pain, swelling, redness, odor or green/yellow discharge around incision site)   Complete by:  As directed    Call MD for:  severe uncontrolled pain   Complete by:  As directed    Call MD for:  temperature >100.4   Complete by:  As directed    Diet - low sodium heart healthy   Complete by:  As directed    Increase activity slowly   Complete by:  As directed      Allergies as of 03/17/2018      Reactions   Avocado Hives   Shortness of breath and dizziness   Other Hives   Pine Nuts = HIVES Avacados = HIVES   Benadryl [diphenhydramine Hcl] Other (See Comments)   Restless legs result if this is taken   Benadryl [diphenhydramine]    Restless leg syndrome      Medication List    TAKE these medications   acetaminophen 325 MG tablet Commonly  known as:  TYLENOL Take 650 mg by mouth every 6 (six) hours as needed (for pain or headaches).   ALPRAZolam 1 MG 24 hr tablet Commonly known as:  XANAX XR Take 1 mg by mouth daily. Takes first thing in the morning   alprazolam 2 MG tablet Commonly known as:  XANAX Take 2 mg by mouth 2 (two) times daily. Takes 1st dose at 57 Noon and second dose a few hours later   azithromycin 250 MG tablet Commonly known as:  ZITHROMAX Take one tablet daily until gone   cefixime 400 MG Caps capsule Commonly known as:  SUPRAX Take 1 capsule (400 mg total) by mouth daily.   cyclobenzaprine 10 MG tablet Commonly known as:  FLEXERIL Take 10 mg by mouth 3 (three) times daily as needed for muscle spasms.   doxepin 50 MG capsule Commonly known as:  SINEQUAN Take 100 mg by mouth at bedtime.   EPINEPHrine 0.3 mg/0.3 mL Soaj injection Commonly known as:  EPI-PEN Inject 0.3 mLs (0.3 mg total) into the muscle once as needed for up to 1 dose (anaphalaxis).   FLUoxetine 20 MG capsule Commonly known as:  PROZAC Take 60 mg by mouth daily.   ibuprofen 600 MG tablet Commonly known as:  ADVIL,MOTRIN Take 1 tablet (600 mg total) by mouth every 6 (six) hours as needed (mild pain).  oxyCODONE 5 MG immediate release tablet Commonly known as:  Oxy IR/ROXICODONE Take 1 tablet (5 mg total) by mouth every 4 (four) hours as needed for severe pain.      Follow-up Information    Campbell Agramonte, MD Follow up in 1 week(s).   Specialty:  Obstetrics and Gynecology Why:  Has appt in one week Contact information: [address], [address] [place] [address] [phone]           Signed: Leighton Roach Mathan Darroch 03/18/2018, 8:56 AM

## 2018-04-22 ENCOUNTER — Emergency Department (HOSPITAL_BASED_OUTPATIENT_CLINIC_OR_DEPARTMENT_OTHER): Payer: Medicaid Other

## 2018-04-22 ENCOUNTER — Emergency Department (HOSPITAL_BASED_OUTPATIENT_CLINIC_OR_DEPARTMENT_OTHER)
Admission: EM | Admit: 2018-04-22 | Discharge: 2018-04-22 | Disposition: A | Discharge status: Home / Self Care | Payer: Medicaid Other | Attending: Emergency Medicine | Admitting: Emergency Medicine

## 2018-04-22 ENCOUNTER — Encounter (HOSPITAL_BASED_OUTPATIENT_CLINIC_OR_DEPARTMENT_OTHER): Payer: Self-pay

## 2018-04-22 ENCOUNTER — Other Ambulatory Visit: Payer: Self-pay

## 2018-04-22 DIAGNOSIS — F17210 Nicotine dependence, cigarettes, uncomplicated: Secondary | ICD-10-CM | POA: Diagnosis not present

## 2018-04-22 DIAGNOSIS — Z79899 Other long term (current) drug therapy: Secondary | ICD-10-CM | POA: Insufficient documentation

## 2018-04-22 DIAGNOSIS — R1010 Upper abdominal pain, unspecified: Secondary | ICD-10-CM | POA: Insufficient documentation

## 2018-04-22 DIAGNOSIS — J45909 Unspecified asthma, uncomplicated: Secondary | ICD-10-CM | POA: Insufficient documentation

## 2018-04-22 DIAGNOSIS — R140 Abdominal distension (gaseous): Secondary | ICD-10-CM | POA: Diagnosis not present

## 2018-04-22 LAB — CBC
HCT: 39.6 % (ref 36.0–46.0)
Hemoglobin: 12.4 g/dL (ref 12.0–15.0)
MCH: 29.7 pg (ref 26.0–34.0)
MCHC: 31.3 g/dL (ref 30.0–36.0)
MCV: 94.7 fL (ref 80.0–100.0)
PLATELETS: 308 10*3/uL (ref 150–400)
RBC: 4.18 MIL/uL (ref 3.87–5.11)
RDW: 14.7 % (ref 11.5–15.5)
WBC: 10.1 10*3/uL (ref 4.0–10.5)
nRBC: 0 % (ref 0.0–0.2)

## 2018-04-22 LAB — COMPREHENSIVE METABOLIC PANEL
ALK PHOS: 90 U/L (ref 38–126)
ALT: 20 U/L (ref 0–44)
AST: 23 U/L (ref 15–41)
Albumin: 3.4 g/dL — ABNORMAL LOW (ref 3.5–5.0)
Anion gap: 6 (ref 5–15)
BILIRUBIN TOTAL: 0.3 mg/dL (ref 0.3–1.2)
BUN: 7 mg/dL (ref 6–20)
CALCIUM: 8.7 mg/dL — AB (ref 8.9–10.3)
CO2: 26 mmol/L (ref 22–32)
Chloride: 105 mmol/L (ref 98–111)
Creatinine, Ser: 0.63 mg/dL (ref 0.44–1.00)
GFR calc Af Amer: >60 mL/min (ref >60)
GFR calc non Af Amer: >60 mL/min (ref >60)
GLUCOSE: 106 mg/dL — AB (ref 70–99)
Potassium: 4.1 mmol/L (ref 3.5–5.1)
Sodium: 137 mmol/L (ref 135–145)
TOTAL PROTEIN: 6.8 g/dL (ref 6.5–8.1)

## 2018-04-22 LAB — URINALYSIS, ROUTINE W REFLEX MICROSCOPIC
BILIRUBIN URINE: NEGATIVE
GLUCOSE, UA: NEGATIVE mg/dL
HGB URINE DIPSTICK: NEGATIVE
Ketones, ur: NEGATIVE mg/dL
Nitrite: NEGATIVE
PROTEIN: NEGATIVE mg/dL
SPECIFIC GRAVITY, URINE: 1.01 (ref 1.005–1.030)
pH: 7 (ref 5.0–8.0)

## 2018-04-22 LAB — URINALYSIS, MICROSCOPIC (REFLEX)

## 2018-04-22 LAB — LIPASE, BLOOD: Lipase: 27 U/L (ref 11–51)

## 2018-04-22 MED ORDER — MORPHINE SULFATE (PF) 4 MG/ML IV SOLN
4.0000 mg | Freq: Once | INTRAVENOUS | Status: AC
  Administered 2018-04-22: 4 mg via INTRAVENOUS
  Filled 2018-04-22: qty 1

## 2018-04-22 MED ORDER — HALOPERIDOL LACTATE 5 MG/ML IJ SOLN
2.0000 mg | Freq: Once | INTRAMUSCULAR | Status: AC
  Administered 2018-04-22: 2 mg via INTRAVENOUS
  Filled 2018-04-22: qty 1

## 2018-04-22 MED ORDER — ALUM & MAG HYDROXIDE-SIMETH 200-200-20 MG/5ML PO SUSP
30.0000 mL | Freq: Once | ORAL | Status: AC
  Administered 2018-04-22: 30 mL via ORAL
  Filled 2018-04-22: qty 30

## 2018-04-22 MED ORDER — ONDANSETRON HCL 4 MG/2ML IJ SOLN
4.0000 mg | Freq: Once | INTRAMUSCULAR | Status: AC
  Administered 2018-04-22: 4 mg via INTRAVENOUS
  Filled 2018-04-22: qty 2

## 2018-04-22 MED ORDER — DICYCLOMINE HCL 10 MG/5ML PO SOLN
10.0000 mg | Freq: Once | ORAL | Status: DC
  Filled 2018-04-22: qty 5

## 2018-04-22 MED ORDER — LIDOCAINE VISCOUS HCL 2 % MT SOLN
15.0000 mL | Freq: Once | OROMUCOSAL | Status: AC
  Administered 2018-04-22: 15 mL via ORAL
  Filled 2018-04-22: qty 15

## 2018-04-22 MED ORDER — DICYCLOMINE HCL 10 MG PO CAPS
ORAL_CAPSULE | ORAL | Status: AC
  Administered 2018-04-22: 10 mg
  Filled 2018-04-22: qty 1

## 2018-04-22 MED ORDER — IOPAMIDOL (ISOVUE-300) INJECTION 61%
100.0000 mL | Freq: Once | INTRAVENOUS | Status: AC | PRN
  Administered 2018-04-22: 100 mL via INTRAVENOUS

## 2018-04-22 MED ORDER — KETOROLAC TROMETHAMINE 30 MG/ML IJ SOLN: 30.0000 mg | Freq: Once | INTRAMUSCULAR | Status: DC

## 2018-04-22 MED ORDER — FENTANYL CITRATE (PF) 100 MCG/2ML IJ SOLN
100.0000 ug | Freq: Once | INTRAMUSCULAR | Status: AC
  Administered 2018-04-22: 100 ug via INTRAVENOUS
  Filled 2018-04-22: qty 2

## 2018-04-22 MED ORDER — DICYCLOMINE HCL 20 MG PO TABS: 20.0000 mg | ORAL_TABLET | Freq: Two times a day (BID) | ORAL | 0 refills | Status: DC | PRN

## 2018-04-22 NOTE — Discharge Instructions (Addendum)
Start  taking pepcid daily for 1-2 weeks.  Eat a low acid diet and avoid greasy and spicy foods. Take bentyl as needed for pain.  Follow up with primary care provider.

## 2018-04-22 NOTE — ED Notes (Signed)
ED Provider at bedside. 

## 2018-04-22 NOTE — ED Triage Notes (Signed)
Pt c/o upper abdominal pain since last pm with abdominal distention this am. Denies n/v/d, last NBM today

## 2018-04-22 NOTE — ED Notes (Signed)
Rx x 1 given for bentyl. Ambulated with steady gait to d/c window

## 2018-04-22 NOTE — ED Provider Notes (Signed)
[facility] EMERGENCY DEPARTMENT Provider Note   CSN: [id number] Arrival date & time: 04/22/18  1349     History   Chief Complaint Chief Complaint  Patient presents with  . Abdominal Pain    HPI Diane Stein is a 32 y.o. female.  32yo F w/ PMH including hysterectomy, asthma, anxiety/depression who p/w abdominal pain.  Around 2-3 AM, she was weakened by constant, severe abdominal pain that is generalized but worse in her upper abdomen, sometimes radiates to back. She reports distension.  Nothing makes the pain better or worse.  She has had no associated nausea, vomiting, or diarrhea.  She had a normal bowel movement this morning with no blood.  She denies any urinary symptoms or vaginal bleeding/discharge.  She tried Tylenol earlier with no relief.  She has never had pain like this before.  She notes previous history of total hysterectomy as well as previous history of laparoscopic ovarian surgery. No alcohol or drug use.  The history is provided by the patient.  Abdominal Pain      Past Medical History:  Diagnosis Date  . Anxiety   . Asthma    as a child  . Bronchitis    history  . Depression   . Dysrhythmia    rapid heartrate  . Pneumonia 2014   History  . Smoker    0.5 ppd x 10 yrs  . SVD (spontaneous vaginal delivery)    x 1  . Tachycardia 2016    Patient Active Problem List   Diagnosis Date Noted  . Post-op pneumonia 03/14/2018  . 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 Pelvic pain in female 08/28/2017  . Paratubal cyst 08/28/2017  . Chest pain at rest 12/26/2016  . Dizziness 12/26/2016  . Syncope and collapse 08/23/2016  . Depression   . Anxiety   . SVD (spontaneous vaginal delivery) 11/08/2014  . Irregular uterine contractions 11/07/2014  . Palpitations 05/12/2014  . Paroxysmal supraventricular tachycardia (HCC) 05/12/2014  . Pregnancy 05/12/2014    Past Surgical History:    Procedure Laterality Date  . HYSTERECTOMY ABDOMINAL WITH SALPINGO-OOPHORECTOMY Bilateral 03/10/2018   Procedure: TOTAL ABDOMINAL HYSTERECTOMY WITH BILATERAL SALPINGO-OOPHORECTOMY;  Surgeon: Lavina Hamman, MD;  Location: [facility];  Service: Gynecology;  Laterality: Bilateral;  . LAPAROSCOPIC UNILATERAL SALPINGECTOMY Left 08/28/2017   Procedure: LAPAROSCOPIC UNILATERAL SALPINGECTOMY WITH REMOVAL OF PARA TUBAL CYST;  Surgeon: Lavina Hamman, MD;  Location: [facility];  Service: Gynecology;  Laterality: Left;  . LAPAROSCOPY Left 08/28/2017   Procedure: LAPAROSCOPY OPERATIVE, FULGERATION OF ENDOMETRIOSIS;  Surgeon: Lavina Hamman, MD;  Location: [facility];  Service: Gynecology;  Laterality: Left;  . OVARIAN CYST SURGERY    . TOOTH EXTRACTION Right 2008     OB History    Gravida  5   Para  1   Term  1   Preterm      AB  3   Living  1     SAB  3   TAB      Ectopic      Multiple  0   Live Births  1            Home Medications    Prior to Admission medications   Medication Sig Start Date End Date Taking? Authorizing Provider  acetaminophen (TYLENOL) 325 MG tablet Take 650 mg by mouth every 6 (six) hours as needed (for pain or headaches).    [provider]  ALPRAZolam (XANAX XR) 1 MG 24 hr  tablet Take 1 mg by mouth daily. Takes first thing in the morning    [provider]  alprazolam Prudy Feeler) 2 MG tablet Take 2 mg by mouth 2 (two) times daily. Takes 1st dose at 41 Noon and second dose a few hours later    [provider]  azithromycin (ZITHROMAX) 250 MG tablet Take one tablet daily until gone 03/17/18   Meisinger, Tawanna Cooler, MD  cefixime (SUPRAX) 400 MG CAPS capsule Take 1 capsule (400 mg total) by mouth daily. 03/17/18   Meisinger, Tawanna Cooler, MD  cyclobenzaprine (FLEXERIL) 10 MG tablet Take 10 mg by mouth 3 (three) times daily as needed for muscle spasms.    [provider]  dicyclomine (BENTYL) 20 MG tablet Take 1 tablet (20 mg total) by mouth 2 (two)  times daily as needed for up to 4 days for spasms. 04/22/18 04/26/18  Niquita Digioia, Ambrose Finland, MD  doxepin (SINEQUAN) 50 MG capsule Take 100 mg by mouth at bedtime.     [provider]  EPINEPHrine 0.3 mg/0.3 mL IJ SOAJ injection Inject 0.3 mLs (0.3 mg total) into the muscle once as needed for up to 1 dose (anaphalaxis). 01/09/18   Loren Racer, MD  FLUoxetine (PROZAC) 20 MG capsule Take 60 mg by mouth daily.    [provider]  ibuprofen (ADVIL,MOTRIN) 600 MG tablet Take 1 tablet (600 mg total) by mouth every 6 (six) hours as needed (mild pain). 03/12/18   Meisinger, Tawanna Cooler, MD  oxyCODONE (ROXICODONE) 5 MG immediate release tablet Take 1 tablet (5 mg total) by mouth every 4 (four) hours as needed for severe pain. 03/12/18   Meisinger, Tawanna Cooler, MD    Family History Family History  Problem Relation Age of Onset  . Hypertension Mother   . Heart disease Father   . Heart disease Maternal Grandmother   . Hypertension Maternal Grandmother   . Heart disease Maternal Grandfather   . Hypertension Maternal Grandfather   . Heart disease Paternal Grandmother   . Hypertension Paternal Grandmother   . Heart disease Paternal Grandfather   . Hypertension Paternal Grandfather     Social History Social History   Tobacco Use  . Smoking status: Current Every Day Smoker    Packs/day: 0.50    Years: 10.00    Pack years: 5.00    Types: Cigarettes  . Smokeless tobacco: Never Used  Substance Use Topics  . Alcohol use: Yes    Alcohol/week: 5.0 standard drinks    Types: 5 Glasses of wine per week  . Drug use: No     Allergies   Avocado; Other; Benadryl [diphenhydramine hcl]; and Benadryl [diphenhydramine]   Review of Systems Review of Systems  Gastrointestinal: Positive for abdominal pain.   All other systems reviewed and are negative except that which was mentioned in HPI  Physical Exam Updated Vital Signs BP 110/65   Pulse 92   Temp 98.3 F (36.8 C) (Oral)   Resp 17   Ht  5\' 3"  (1.6 m)   Wt 77.1 kg   LMP  (LMP Unknown) Comment: Cont. OCP  SpO2 97%   BMI 30.11 kg/m   Physical Exam  Constitutional: She is oriented to person, place, and time. She appears well-developed and well-nourished.  Tearful, in distress due to pain  HENT:  Head: Normocephalic and atraumatic.  Moist mucous membranes  Eyes: Pupils are equal, round, and reactive to light. Conjunctivae are normal.  Neck: Neck supple.  Cardiovascular: Regular rhythm and normal heart sounds. Tachycardia present.  No murmur heard. Pulmonary/Chest: Effort normal and breath sounds normal.  Abdominal: Soft. Bowel sounds are normal. She exhibits no distension. There is tenderness. There is no rigidity and no guarding.  Generalized TTP worst in mid-epigastrium  Musculoskeletal: She exhibits no edema.  Neurological: She is alert and oriented to person, place, and time.  Fluent speech  Skin: Skin is warm and dry.  Psychiatric: Judgment normal.  tearful  Nursing note and vitals reviewed.    ED Treatments / Results  Labs (all labs ordered are listed, but only abnormal results are displayed) Labs Reviewed  COMPREHENSIVE METABOLIC PANEL - Abnormal; Notable for the following components:      Result Value   Glucose, Bld 106 (*)    Calcium 8.7 (*)    Albumin 3.4 (*)    All other components within normal limits  URINALYSIS, ROUTINE W REFLEX MICROSCOPIC - Abnormal; Notable for the following components:   Leukocytes, UA SMALL (*)    All other components within normal limits  URINALYSIS, MICROSCOPIC (REFLEX) - Abnormal; Notable for the following components:   Bacteria, UA FEW (*)    All other components within normal limits  LIPASE, BLOOD  CBC    EKG None  Radiology Ct Abdomen Pelvis W Contrast  Result Date: 04/22/2018 CLINICAL DATA:  Right upper abdominal pain since last night with abdominal distention today. No nausea, vomiting or diarrhea. History of hysterectomy and bilateral salpingo  oophorectomy. EXAM: CT ABDOMEN AND PELVIS WITH CONTRAST TECHNIQUE: Multidetector CT imaging of the abdomen and pelvis was performed using the standard protocol following bolus administration of intravenous contrast. CONTRAST:  ISOVUE-300 IOPAMIDOL (ISOVUE-300) INJECTION 61% COMPARISON:  Abdominopelvic CT 03/14/2018 and 10/24/2016. FINDINGS: Lower chest: There is subsegmental atelectasis at both lung bases which has worsened on the right and mildly improved on the left. There is a trace right pleural effusion. No pericardial effusion. Hepatobiliary: The liver is normal in density without focal abnormality. No evidence of gallstones, gallbladder wall thickening or biliary dilatation. Pancreas: Unremarkable. No pancreatic ductal dilatation or surrounding inflammatory changes. Spleen: Normal in size without focal abnormality. Adrenals/Urinary Tract: Both adrenal glands appear normal. The kidneys, ureters and bladder appear normal. No urinary tract calculus or hydronephrosis. The bladder is mildly distended. Stomach/Bowel: No evidence of bowel wall thickening, distention or surrounding inflammatory change. The appendix appears normal. Vascular/Lymphatic: There are no enlarged abdominal or pelvic lymph nodes. No significant vascular findings. Reproductive: Hysterectomy with improving inflammatory changes in the pelvic fat and anterior abdominal wall. There is no focal fluid collection. Other: No ascites or free air. Musculoskeletal: No acute or significant osseous findings. IMPRESSION: 1. Resolving postsurgical changes in the low anterior abdominal wall and pelvis related to recent hysterectomy. No focal fluid collection. 2. Fluctuating bibasilar pulmonary atelectasis, worse on the right and improved on the left. 3. No evidence of bowel obstruction or appendicitis. Electronically Signed   By: Carey Bullocks M.D.   On: 04/22/2018 16:20    Procedures Procedures (including critical care time)  Medications  Ordered in ED Medications  dicyclomine (BENTYL) 10 MG/5ML syrup 10 mg (10 mg Oral Not Given 04/22/18 1721)  ketorolac (TORADOL) 30 MG/ML injection 30 mg (has no administration in time range)  fentaNYL (SUBLIMAZE) injection 100 mcg (100 mcg Intravenous Given 04/22/18 1524)  iopamidol (ISOVUE-300) 61 % injection 100 mL (100 mLs Intravenous Contrast Given 04/22/18 1603)  morphine 4 MG/ML injection 4 mg (4 mg Intravenous Given 04/22/18 1639)  ondansetron (ZOFRAN) injection 4 mg (4 mg Intravenous Given 04/22/18  1639)  alum & mag hydroxide-simeth (MAALOX/MYLANTA) 200-200-20 MG/5ML suspension 30 mL (30 mLs Oral Given 04/22/18 1718)    And  lidocaine (XYLOCAINE) 2 % viscous mouth solution 15 mL (15 mLs Oral Given 04/22/18 1718)  dicyclomine (BENTYL) 10 MG capsule (10 mg  Given 04/22/18 1718)  haloperidol lactate (HALDOL) injection 2 mg (2 mg Intravenous Given 04/22/18 1823)     Initial Impression / Assessment and Plan / ED Course  I have reviewed the triage vital signs and the nursing notes.  Pertinent labs & imaging results that were available during my care of the patient were reviewed by me and considered in my medical decision making (see chart for details).     Tearful due to pain but non-toxic. Afebrile, mild tachycardia. Based on exam, ddx includes bowel obstruction, gallbladder pathology, pancreatitis.  Labs show normal LFTS and lipase, normal CBC, UA not overly concerning for infection. Obtained CT to evaluate for acute process such as obstruction given degree of pain.  Labwork unremarkable. CT negative for acute process.  Because of reassuring work-up and normal vital signs with no other associated symptoms, I feel she is safe for supportive measures at home and close PCP follow-up.  Because she has been having upper abdominal pain, I recommended a low acid diet and starting Pepcid or Zantac daily for 1 to 2 weeks.  Also provided Bentyl to use as needed.  I extensively reviewed return  precautions with her and she voiced understanding.  Final Clinical Impressions(s) / ED Diagnoses   Final diagnoses:  Upper abdominal pain    ED Discharge Orders         Ordered    dicyclomine (BENTYL) 20 MG tablet  2 times daily PRN     04/22/18 1843           Bradyn Soward, Ambrose Finlandachel Morgan, MD 04/22/18 1845

## 2018-04-22 NOTE — ED Notes (Signed)
Dr. Clarene DukeLittle notified of pt's c/o pain unrelieved

## 2018-09-13 ENCOUNTER — Emergency Department (HOSPITAL_BASED_OUTPATIENT_CLINIC_OR_DEPARTMENT_OTHER): Payer: Medicaid Other

## 2018-09-13 ENCOUNTER — Emergency Department (HOSPITAL_BASED_OUTPATIENT_CLINIC_OR_DEPARTMENT_OTHER)
Admission: EM | Admit: 2018-09-13 | Discharge: 2018-09-13 | Disposition: A | Discharge status: Home / Self Care | Payer: Medicaid Other | Attending: Emergency Medicine | Admitting: Emergency Medicine

## 2018-09-13 ENCOUNTER — Other Ambulatory Visit: Payer: Self-pay

## 2018-09-13 ENCOUNTER — Encounter (HOSPITAL_BASED_OUTPATIENT_CLINIC_OR_DEPARTMENT_OTHER): Payer: Self-pay | Admitting: *Deleted

## 2018-09-13 DIAGNOSIS — F17210 Nicotine dependence, cigarettes, uncomplicated: Secondary | ICD-10-CM | POA: Insufficient documentation

## 2018-09-13 DIAGNOSIS — J069 Acute upper respiratory infection, unspecified: Secondary | ICD-10-CM

## 2018-09-13 DIAGNOSIS — R509 Fever, unspecified: Secondary | ICD-10-CM | POA: Diagnosis present

## 2018-09-13 DIAGNOSIS — Z79899 Other long term (current) drug therapy: Secondary | ICD-10-CM | POA: Insufficient documentation

## 2018-09-13 DIAGNOSIS — J45909 Unspecified asthma, uncomplicated: Secondary | ICD-10-CM | POA: Insufficient documentation

## 2018-09-13 DIAGNOSIS — M25562 Pain in left knee: Secondary | ICD-10-CM | POA: Diagnosis not present

## 2018-09-13 MED ORDER — KETOROLAC TROMETHAMINE 30 MG/ML IJ SOLN
30.0000 mg | Freq: Once | INTRAMUSCULAR | Status: AC
  Administered 2018-09-13: 08:00:00 30 mg via INTRAMUSCULAR
  Filled 2018-09-13: qty 1

## 2018-09-13 NOTE — Discharge Instructions (Signed)
If your knee swelling worsens to the point that you cannot bend it or cannot walk on it, is red or hot, or any other new/concerning symptoms then return to the ER.   If you become very short of breath, cough up blood, or other new/concerning symptoms then come back to the ER or see your doctor.  Otherwise alternate Tylenol and ibuprofen for discomfort and elevate your left knee.  In this current pandemic, we are presuming you could have COVID-19, and you need to self-quarantine for 7 days, with at least 3 days of no symptoms.

## 2018-09-13 NOTE — ED Provider Notes (Signed)
[facility] EMERGENCY DEPARTMENT Provider Note   CSN: [id number] Arrival date & time: 09/13/18  0745    History   Chief Complaint Chief Complaint  Patient presents with  . Fever    HPI Diane Stein is a 33 y.o. female.     HPI  33 year old female presents with fever and cough.  She also notes thoracic back pain and left knee pain.  The fever and cough have been together for about 1 week.  Max temp 102.  The cough is dry.  Occasionally feels short of breath but not currently.  No chest pain.  She is noticed some nasal congestion and left ear pain and decreased hearing over the last 3 days.  Since yesterday she has had thoracic back pain across her bra line.  This morning she woke up and her left knee was swollen and painful to move.  She is able to ambulate.  She took 2 extra strength Tylenol around 530 but this seems to be wearing off.  No trauma to her knee. She works at [employer], but no specific known COVID-19 contacts.  Past Medical History:  Diagnosis Date  . Anxiety   . Asthma    as a child  . Bronchitis    history  . Depression   . Dysrhythmia    rapid heartrate  . Pneumonia 2014   History  . Smoker    0.5 ppd x 10 yrs  . SVD (spontaneous vaginal delivery)    x 1  . Tachycardia 2016    Patient Active Problem List   Diagnosis Date Noted  . Post-op pneumonia 03/14/2018  . 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 Pelvic pain in female 08/28/2017  . Paratubal cyst 08/28/2017  . Chest pain at rest 12/26/2016  . Dizziness 12/26/2016  . Syncope and collapse 08/23/2016  . Depression   . Anxiety   . SVD (spontaneous vaginal delivery) 11/08/2014  . Irregular uterine contractions 11/07/2014  . Palpitations 05/12/2014  . Paroxysmal supraventricular tachycardia (HCC) 05/12/2014  . Pregnancy 05/12/2014    Past Surgical History:  Procedure Laterality Date  . HYSTERECTOMY ABDOMINAL WITH  SALPINGO-OOPHORECTOMY Bilateral 03/10/2018   Procedure: TOTAL ABDOMINAL HYSTERECTOMY WITH BILATERAL SALPINGO-OOPHORECTOMY;  Surgeon: Lavina Hamman, MD;  Location: [facility];  Service: Gynecology;  Laterality: Bilateral;  . LAPAROSCOPIC UNILATERAL SALPINGECTOMY Left 08/28/2017   Procedure: LAPAROSCOPIC UNILATERAL SALPINGECTOMY WITH REMOVAL OF PARA TUBAL CYST;  Surgeon: Lavina Hamman, MD;  Location: [facility];  Service: Gynecology;  Laterality: Left;  . LAPAROSCOPY Left 08/28/2017   Procedure: LAPAROSCOPY OPERATIVE, FULGERATION OF ENDOMETRIOSIS;  Surgeon: Lavina Hamman, MD;  Location: [facility];  Service: Gynecology;  Laterality: Left;  . OVARIAN CYST SURGERY    . TOOTH EXTRACTION Right 2008     OB History    Gravida  5   Para  1   Term  1   Preterm      AB  3   Living  1     SAB  3   TAB      Ectopic      Multiple  0   Live Births  1            Home Medications    Prior to Admission medications   Medication Sig Start Date End Date Taking? Authorizing Provider  acetaminophen (TYLENOL) 325 MG tablet Take 650 mg by mouth every 6 (six) hours as needed (for pain or headaches).    [provider]  ALPRAZolam (XANAX XR) 1 MG 24 hr tablet Take 1 mg by mouth daily. Takes first thing in the morning    [provider]  alprazolam Prudy Feeler) 2 MG tablet Take 2 mg by mouth 2 (two) times daily. Takes 1st dose at 39 Noon and second dose a few hours later    [provider]  azithromycin (ZITHROMAX) 250 MG tablet Take one tablet daily until gone 03/17/18   Meisinger, Tawanna Cooler, MD  cefixime (SUPRAX) 400 MG CAPS capsule Take 1 capsule (400 mg total) by mouth daily. 03/17/18   Meisinger, Tawanna Cooler, MD  cyclobenzaprine (FLEXERIL) 10 MG tablet Take 10 mg by mouth 3 (three) times daily as needed for muscle spasms.    [provider]  dicyclomine (BENTYL) 20 MG tablet Take 1 tablet (20 mg total) by mouth 2 (two) times daily as needed for up to 4 days for spasms. 04/22/18  04/26/18  Little, Ambrose Finland, MD  doxepin (SINEQUAN) 50 MG capsule Take 100 mg by mouth at bedtime.     [provider]  EPINEPHrine 0.3 mg/0.3 mL IJ SOAJ injection Inject 0.3 mLs (0.3 mg total) into the muscle once as needed for up to 1 dose (anaphalaxis). 01/09/18   Loren Racer, MD  FLUoxetine (PROZAC) 20 MG capsule Take 60 mg by mouth daily.    [provider]  ibuprofen (ADVIL,MOTRIN) 600 MG tablet Take 1 tablet (600 mg total) by mouth every 6 (six) hours as needed (mild pain). 03/12/18   Meisinger, Tawanna Cooler, MD  oxyCODONE (ROXICODONE) 5 MG immediate release tablet Take 1 tablet (5 mg total) by mouth every 4 (four) hours as needed for severe pain. 03/12/18   Meisinger, Tawanna Cooler, MD    Family History Family History  Problem Relation Age of Onset  . Hypertension Mother   . Heart disease Father   . Heart disease Maternal Grandmother   . Hypertension Maternal Grandmother   . Heart disease Maternal Grandfather   . Hypertension Maternal Grandfather   . Heart disease Paternal Grandmother   . Hypertension Paternal Grandmother   . Heart disease Paternal Grandfather   . Hypertension Paternal Grandfather     Social History Social History   Tobacco Use  . Smoking status: Current Every Day Smoker    Packs/day: 0.50    Years: 10.00    Pack years: 5.00    Types: Cigarettes  . Smokeless tobacco: Never Used  Substance Use Topics  . Alcohol use: Yes    Alcohol/week: 5.0 standard drinks    Types: 5 Glasses of wine per week  . Drug use: No     Allergies   Avocado; Other; Benadryl [diphenhydramine hcl]; and Benadryl [diphenhydramine]   Review of Systems Review of Systems  Constitutional: Positive for fever.  HENT: Positive for congestion and ear pain.   Respiratory: Positive for cough and shortness of breath.   Musculoskeletal: Positive for arthralgias, back pain and joint swelling.  All other systems reviewed and are negative.    Physical Exam Updated Vital  Signs BP 127/75 (BP Location: Right Arm)   Pulse (!) 108   Temp 98.2 F (36.8 C) (Oral)   Resp 16   Ht  (1.6 m)   Wt 77.1 kg   LMP  (LMP Unknown) Comment: Cont. OCP  SpO2 99%   BMI 30.11 kg/m   Physical Exam Vitals signs and nursing note reviewed.  Constitutional:      Appearance: She is well-developed.  HENT:     Head: Normocephalic and atraumatic.  Right Ear: Tympanic membrane, ear canal and external ear normal. No tenderness.     Left Ear: Tympanic membrane, ear canal and external ear normal. No tenderness.     Nose: Nose normal.  Eyes:     General:        Right eye: No discharge.        Left eye: No discharge.  Cardiovascular:     Rate and Rhythm: Regular rhythm. Tachycardia present.     Pulses:          Dorsalis pedis pulses are 2+ on the left side.     Heart sounds: Normal heart sounds.     Comments: HR low 100s Pulmonary:     Effort: Pulmonary effort is normal. No tachypnea or accessory muscle usage.     Breath sounds: Normal breath sounds. No wheezing, rhonchi or rales.  Abdominal:     Palpations: Abdomen is soft.     Tenderness: There is no abdominal tenderness.  Musculoskeletal:     Left knee: She exhibits swelling. She exhibits normal range of motion, no effusion and no erythema. Tenderness found.     Thoracic back: She exhibits tenderness (mild, diffuse).       Back:     Comments: Mild left knee swelling compared to right but no effusion. No warmth or erythema. ROM is painful, but normal. Diffuse tenderness anteriorly  Skin:    General: Skin is warm and dry.  Neurological:     Mental Status: She is alert.  Psychiatric:        Mood and Affect: Mood is not anxious.      ED Treatments / Results  Labs (all labs ordered are listed, but only abnormal results are displayed) Labs Reviewed - No data to display  EKG None  Radiology Dg Chest Portable 1 View  Result Date: 09/13/2018 CLINICAL DATA:  Cough and fever EXAM: PORTABLE CHEST 1 VIEW  COMPARISON:  March 14, 2018 FINDINGS: There is no appreciable edema or consolidation. Heart size and pulmonary vascularity are normal. No adenopathy. No pneumothorax. No bone lesions. IMPRESSION: No edema or consolidation. Electronically Signed   By: Bretta Bang III M.D.   On: 09/13/2018 08:37    Procedures Procedures (including critical care time)  Medications Ordered in ED Medications  ketorolac (TORADOL) 30 MG/ML injection 30 mg (30 mg Intramuscular Given 09/13/18 0826)     Initial Impression / Assessment and Plan / ED Course  I have reviewed the triage vital signs and the nursing notes.  Pertinent labs & imaging results that were available during my care of the patient were reviewed by me and considered in my medical decision making (see chart for details).        Patient is afebrile here and appears well.  She appears uncomfortable but in no distress.  No increased work of breathing.  Chest x-ray is we are.  In this current pandemic, could have URI or could have mild COVID-19.  She will be asked to self quarantine.  As for her back, possibly related to the coughing but overall appears mild.  It is higher than CVA.  As for her knee, there does appear to be a little bit of swelling but no effusion and there is no erythema, warmth, or limited range of motion to suggest septic joint.  I offered x-ray but she declines.  Given no local trauma I think this is pretty unlikely to have a bony abnormality.  She is asking for something stronger than ibuprofen  and Tylenol but at this point I do not see anything necessitating narcotics at this time.  I have discussed she needs a follow-up with her PCP and we discussed return precautions for the knee and respiratory symptoms.  Stasia Cavalierlizabeth Fralix was evaluated in Emergency Department on 09/13/2018 for the symptoms described in the history of present illness. She was evaluated in the context of the global COVID-19 pandemic, which necessitated  consideration that the patient might be at risk for infection with the SARS-CoV-2 virus that causes COVID-19. Institutional protocols and algorithms that pertain to the evaluation of patients at risk for COVID-19 are in a state of rapid change based on information released by regulatory bodies including the CDC and federal and state organizations. These policies and algorithms were followed during the patient's care in the ED.   Final Clinical Impressions(s) / ED Diagnoses   Final diagnoses:  Viral upper respiratory illness  Acute pain of left knee    ED Discharge Orders    None       Pricilla LovelessGoldston, Jashayla Glatfelter, MD 09/13/18 [phone]

## 2018-09-13 NOTE — ED Notes (Addendum)
Pt called out and states she feels sob and has headache.  No acute respiratory distress noted.  Pox 100%.  [id number].  Lungs auscultate clear x 4 posteriorly and x 2 anteriorly.  Noted red tonsils, slightly swollen but no airway impairment.  Denies sore throat.  Pt states she has had toradol in the past.  Denies any allergies previously.  Dr. Criss Alvine notified.

## 2018-09-13 NOTE — ED Triage Notes (Signed)
C/o fever x 1 week  Up to 102,  Dry cough, left ear stopped up and some pain, x 3 days  And left knee pain w slight swelling  Onset last pm

## 2018-10-04 ENCOUNTER — Emergency Department (HOSPITAL_BASED_OUTPATIENT_CLINIC_OR_DEPARTMENT_OTHER): Payer: Medicaid Other

## 2018-10-04 ENCOUNTER — Other Ambulatory Visit: Payer: Self-pay

## 2018-10-04 ENCOUNTER — Encounter (HOSPITAL_BASED_OUTPATIENT_CLINIC_OR_DEPARTMENT_OTHER): Payer: Self-pay | Admitting: *Deleted

## 2018-10-04 ENCOUNTER — Emergency Department (HOSPITAL_BASED_OUTPATIENT_CLINIC_OR_DEPARTMENT_OTHER)
Admission: EM | Admit: 2018-10-04 | Discharge: 2018-10-04 | Disposition: A | Discharge status: Home / Self Care | Payer: Medicaid Other | Attending: Emergency Medicine | Admitting: Emergency Medicine

## 2018-10-04 DIAGNOSIS — F17210 Nicotine dependence, cigarettes, uncomplicated: Secondary | ICD-10-CM | POA: Insufficient documentation

## 2018-10-04 DIAGNOSIS — J45909 Unspecified asthma, uncomplicated: Secondary | ICD-10-CM | POA: Diagnosis not present

## 2018-10-04 DIAGNOSIS — G44209 Tension-type headache, unspecified, not intractable: Secondary | ICD-10-CM | POA: Insufficient documentation

## 2018-10-04 DIAGNOSIS — Y939 Activity, unspecified: Secondary | ICD-10-CM | POA: Diagnosis not present

## 2018-10-04 DIAGNOSIS — R519 Headache, unspecified: Secondary | ICD-10-CM

## 2018-10-04 DIAGNOSIS — Z79899 Other long term (current) drug therapy: Secondary | ICD-10-CM | POA: Diagnosis not present

## 2018-10-04 DIAGNOSIS — W19XXXA Unspecified fall, initial encounter: Secondary | ICD-10-CM | POA: Diagnosis not present

## 2018-10-04 DIAGNOSIS — S93602A Unspecified sprain of left foot, initial encounter: Secondary | ICD-10-CM | POA: Diagnosis not present

## 2018-10-04 DIAGNOSIS — Y929 Unspecified place or not applicable: Secondary | ICD-10-CM | POA: Diagnosis not present

## 2018-10-04 DIAGNOSIS — R55 Syncope and collapse: Secondary | ICD-10-CM | POA: Diagnosis present

## 2018-10-04 DIAGNOSIS — Y999 Unspecified external cause status: Secondary | ICD-10-CM | POA: Insufficient documentation

## 2018-10-04 LAB — CBC WITH DIFFERENTIAL/PLATELET
Abs Immature Granulocytes: 0.03 10*3/uL (ref 0.00–0.07)
Basophils Absolute: 0 10*3/uL (ref 0.0–0.1)
Basophils Relative: 0 %
Eosinophils Absolute: 0.1 10*3/uL (ref 0.0–0.5)
Eosinophils Relative: 1 %
HCT: 42.6 % (ref 36.0–46.0)
Hemoglobin: 13.9 g/dL (ref 12.0–15.0)
Immature Granulocytes: 0 %
Lymphocytes Relative: 27 %
Lymphs Abs: 2.5 10*3/uL (ref 0.7–4.0)
MCH: 30.6 pg (ref 26.0–34.0)
MCHC: 32.6 g/dL (ref 30.0–36.0)
MCV: 93.8 fL (ref 80.0–100.0)
Monocytes Absolute: 0.7 10*3/uL (ref 0.1–1.0)
Monocytes Relative: 7 %
Neutro Abs: 6.1 10*3/uL (ref 1.7–7.7)
Neutrophils Relative %: 65 %
Platelets: 300 10*3/uL (ref 150–400)
RBC: 4.54 MIL/uL (ref 3.87–5.11)
RDW: 14.6 % (ref 11.5–15.5)
WBC: 9.5 10*3/uL (ref 4.0–10.5)
nRBC: 0 % (ref 0.0–0.2)

## 2018-10-04 LAB — COMPREHENSIVE METABOLIC PANEL
ALT: 13 U/L (ref 0–44)
AST: 15 U/L (ref 15–41)
Albumin: 4 g/dL (ref 3.5–5.0)
Alkaline Phosphatase: 92 U/L (ref 38–126)
Anion gap: 9 (ref 5–15)
BUN: 20 mg/dL (ref 6–20)
CO2: 26 mmol/L (ref 22–32)
Calcium: 8.7 mg/dL — ABNORMAL LOW (ref 8.9–10.3)
Chloride: 104 mmol/L (ref 98–111)
Creatinine, Ser: 0.7 mg/dL (ref 0.44–1.00)
GFR calc Af Amer: >60 mL/min (ref >60)
GFR calc non Af Amer: >60 mL/min (ref >60)
Glucose, Bld: 100 mg/dL — ABNORMAL HIGH (ref 70–99)
Potassium: 4.1 mmol/L (ref 3.5–5.1)
Sodium: 139 mmol/L (ref 135–145)
Total Bilirubin: 0.2 mg/dL — ABNORMAL LOW (ref 0.3–1.2)
Total Protein: 7 g/dL (ref 6.5–8.1)

## 2018-10-04 LAB — URINALYSIS, ROUTINE W REFLEX MICROSCOPIC
Bilirubin Urine: NEGATIVE
Glucose, UA: NEGATIVE mg/dL
Hgb urine dipstick: NEGATIVE
Ketones, ur: NEGATIVE mg/dL
Leukocytes,Ua: NEGATIVE
Nitrite: NEGATIVE
Protein, ur: NEGATIVE mg/dL
Specific Gravity, Urine: 1.025 (ref 1.005–1.030)
pH: 7 (ref 5.0–8.0)

## 2018-10-04 MED ORDER — SODIUM CHLORIDE 0.9 % IV BOLUS
1000.0000 mL | Freq: Once | INTRAVENOUS | Status: AC
  Administered 2018-10-04: 12:00:00 1000 mL via INTRAVENOUS

## 2018-10-04 MED ORDER — KETOROLAC TROMETHAMINE 15 MG/ML IJ SOLN
15.0000 mg | Freq: Once | INTRAMUSCULAR | Status: AC
  Administered 2018-10-04: 12:00:00 15 mg via INTRAVENOUS
  Filled 2018-10-04: qty 1

## 2018-10-04 MED ORDER — NAPROXEN 500 MG PO TABS: 500.0000 mg | ORAL_TABLET | Freq: Two times a day (BID) | ORAL | 0 refills | Status: AC

## 2018-10-04 MED ORDER — METHOCARBAMOL 500 MG PO TABS: 500.0000 mg | ORAL_TABLET | Freq: Two times a day (BID) | ORAL | 0 refills | Status: DC

## 2018-10-04 NOTE — Discharge Instructions (Signed)
Thank you for allowing me to care for you today. Please return to the emergency department if you have new or worsening symptoms. Take your medications as instructed.  ° °

## 2018-10-04 NOTE — ED Triage Notes (Signed)
Fell yesterday  Hit head  No loc  C/o head pain, left side pain and foot pain  ambulatory

## 2018-10-04 NOTE — ED Provider Notes (Signed)
[facility] EMERGENCY DEPARTMENT Provider Note   CSN: [id number] Arrival date & time: 10/04/18  1019    History   Chief Complaint No chief complaint on file.   HPI Diane Stein is a 33 y.o. female.     Patient is a 33 year old female with past medical history of anxiety, asthma, tachycardia, depression who presents the emergency department for syncope.  Patient reports that last night around 6 PM she was running around with her 74-year-old child and playing tag.  Reports that she had some sort of fall.  Reports she does not remember anything after the fall up until she woke up this morning.  She reports that she does not think the fall was witnessed by anyone else but that her mom and her sister supposedly helped her stand up.  Reports that her sister told her after she got up she went right to bed for the rest of the night.  Patient does not remember any of this.  She is unsure if she tripped or what kind of surface she fell on.  Reports that she has a severe headache, abrasion to her left arm, bruising to her left hip and she has left foot pain.  Reports she has had episodes of syncope in the past but this was while she was taking metoprolol which she is no longer taking.  She denies any alcohol or drug use.  She denies any vision changes or other episodes of loss of consciousness since waking up this morning.     Past Medical History:  Diagnosis Date  . Anxiety   . Asthma    as a child  . Bronchitis    history  . Depression   . Dysrhythmia    rapid heartrate  . Pneumonia 2014   History  . Smoker    0.5 ppd x 10 yrs  . SVD (spontaneous vaginal delivery)    x 1  . Tachycardia 2016    Patient Active Problem List   Diagnosis Date Noted  . Post-op pneumonia 03/14/2018  . 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 Pelvic pain in female 08/28/2017  . Paratubal cyst 08/28/2017  . Chest pain at rest 12/26/2016   . Dizziness 12/26/2016  . Syncope and collapse 08/23/2016  . Depression   . Anxiety   . SVD (spontaneous vaginal delivery) 11/08/2014  . Irregular uterine contractions 11/07/2014  . Palpitations 05/12/2014  . Paroxysmal supraventricular tachycardia (HCC) 05/12/2014  . Pregnancy 05/12/2014    Past Surgical History:  Procedure Laterality Date  . HYSTERECTOMY ABDOMINAL WITH SALPINGO-OOPHORECTOMY Bilateral 03/10/2018   Procedure: TOTAL ABDOMINAL HYSTERECTOMY WITH BILATERAL SALPINGO-OOPHORECTOMY;  Surgeon: Lavina Hamman, MD;  Location: [facility];  Service: Gynecology;  Laterality: Bilateral;  . LAPAROSCOPIC UNILATERAL SALPINGECTOMY Left 08/28/2017   Procedure: LAPAROSCOPIC UNILATERAL SALPINGECTOMY WITH REMOVAL OF PARA TUBAL CYST;  Surgeon: Lavina Hamman, MD;  Location: [facility];  Service: Gynecology;  Laterality: Left;  . LAPAROSCOPY Left 08/28/2017   Procedure: LAPAROSCOPY OPERATIVE, FULGERATION OF ENDOMETRIOSIS;  Surgeon: Lavina Hamman, MD;  Location: [facility];  Service: Gynecology;  Laterality: Left;  . OVARIAN CYST SURGERY    . TOOTH EXTRACTION Right 2008     OB History    Gravida  5   Para  1   Term  1   Preterm      AB  3   Living  1     SAB  3   TAB      Ectopic  Multiple  0   Live Births  1            Home Medications    Prior to Admission medications   Medication Sig Start Date End Date Taking? Authorizing Provider  acetaminophen (TYLENOL) 325 MG tablet Take 650 mg by mouth every 6 (six) hours as needed (for pain or headaches).    [provider]  ALPRAZolam (XANAX XR) 1 MG 24 hr tablet Take 1 mg by mouth daily. Takes first thing in the morning    [provider]  alprazolam Prudy Feeler) 2 MG tablet Take 2 mg by mouth 2 (two) times daily. Takes 1st dose at 32 Noon and second dose a few hours later    [provider]  azithromycin (ZITHROMAX) 250 MG tablet Take one tablet daily until gone 03/17/18   Meisinger, Tawanna Cooler, MD  cefixime  (SUPRAX) 400 MG CAPS capsule Take 1 capsule (400 mg total) by mouth daily. 03/17/18   Meisinger, Tawanna Cooler, MD  cyclobenzaprine (FLEXERIL) 10 MG tablet Take 10 mg by mouth 3 (three) times daily as needed for muscle spasms.    [provider]  dicyclomine (BENTYL) 20 MG tablet Take 1 tablet (20 mg total) by mouth 2 (two) times daily as needed for up to 4 days for spasms. 04/22/18 04/26/18  Little, Ambrose Finland, MD  doxepin (SINEQUAN) 50 MG capsule Take 100 mg by mouth at bedtime.     [provider]  EPINEPHrine 0.3 mg/0.3 mL IJ SOAJ injection Inject 0.3 mLs (0.3 mg total) into the muscle once as needed for up to 1 dose (anaphalaxis). 01/09/18   Loren Racer, MD  FLUoxetine (PROZAC) 20 MG capsule Take 60 mg by mouth daily.    [provider]  ibuprofen (ADVIL,MOTRIN) 600 MG tablet Take 1 tablet (600 mg total) by mouth every 6 (six) hours as needed (mild pain). 03/12/18   Meisinger, Tawanna Cooler, MD  oxyCODONE (ROXICODONE) 5 MG immediate release tablet Take 1 tablet (5 mg total) by mouth every 4 (four) hours as needed for severe pain. 03/12/18   Meisinger, Tawanna Cooler, MD    Family History Family History  Problem Relation Age of Onset  . Hypertension Mother   . Heart disease Father   . Heart disease Maternal Grandmother   . Hypertension Maternal Grandmother   . Heart disease Maternal Grandfather   . Hypertension Maternal Grandfather   . Heart disease Paternal Grandmother   . Hypertension Paternal Grandmother   . Heart disease Paternal Grandfather   . Hypertension Paternal Grandfather     Social History Social History   Tobacco Use  . Smoking status: Current Every Day Smoker    Packs/day: 0.50    Years: 10.00    Pack years: 5.00    Types: Cigarettes  . Smokeless tobacco: Never Used  Substance Use Topics  . Alcohol use: Yes    Alcohol/week: 5.0 standard drinks    Types: 5 Glasses of wine per week  . Drug use: No     Allergies   Avocado; Other; Benadryl  [diphenhydramine hcl]; and Benadryl [diphenhydramine]   Review of Systems Review of Systems  Constitutional: Negative for chills, fatigue and fever.  HENT: Negative for ear pain and sore throat.   Eyes: Negative for photophobia, pain and visual disturbance.  Respiratory: Negative for cough and shortness of breath.   Cardiovascular: Negative for chest pain and palpitations.  Gastrointestinal: Negative for abdominal pain, nausea and vomiting.  Genitourinary: Negative for dysuria and hematuria.  Musculoskeletal: Positive for  arthralgias. Negative for back pain, myalgias, neck pain and neck stiffness.  Skin: Positive for wound. Negative for color change and rash.  Neurological: Positive for syncope and headaches. Negative for dizziness, tremors, seizures, facial asymmetry, speech difficulty, weakness, light-headedness and numbness.  All other systems reviewed and are negative.    Physical Exam Updated Vital Signs BP 133/72 (BP Location: Right Arm)   Pulse (!) 112   Temp 98.9 F (37.2 C) (Oral)   Resp 18   LMP  (LMP Unknown) Comment: Cont. OCP  SpO2 96%   Physical Exam Vitals signs reviewed.  Constitutional:      General: She is not in acute distress.    Appearance: Normal appearance. She is not ill-appearing, toxic-appearing or diaphoretic.  HENT:     Head: Normocephalic and atraumatic. No raccoon eyes, Battle's sign, abrasion, contusion, masses or laceration.     Jaw: No tenderness.     Nose: No signs of injury.  Eyes:     Extraocular Movements: Extraocular movements intact.     Conjunctiva/sclera: Conjunctivae normal.     Pupils: Pupils are equal, round, and reactive to light.  Neck:     Musculoskeletal: Full passive range of motion without pain. No pain with movement, spinous process tenderness or muscular tenderness.  Cardiovascular:     Pulses: Normal pulses.     Heart sounds: Normal heart sounds.  Pulmonary:     Effort: Pulmonary effort is normal.     Breath sounds:  Normal breath sounds.  Musculoskeletal:     Comments: The left foot is normal in appearance.  Left ankle is normal with range of motion, sensation.  Normal pedal pulses.  She does have pain with dorsi flexion and plantar flexion of the foot and tenderness over the dorsum of the midfoot.  Skin:    General: Skin is warm.     Comments: Ecchymosis to the lateral L hip about 6 cm. 5 cm abrasion to the lateral left arm.  No drainage or signs of infection.  Neurological:     General: No focal deficit present.     Mental Status: She is alert.     Cranial Nerves: No cranial nerve deficit.     Sensory: No sensory deficit.     Motor: No weakness.     Gait: Gait normal.  Psychiatric:        Mood and Affect: Mood normal.        Behavior: Behavior normal.      ED Treatments / Results  Labs (all labs ordered are listed, but only abnormal results are displayed) Labs Reviewed  COMPREHENSIVE METABOLIC PANEL  CBC WITH DIFFERENTIAL/PLATELET  PREGNANCY, URINE  URINALYSIS, ROUTINE W REFLEX MICROSCOPIC    EKG None  Radiology No results found.  Procedures Procedures (including critical care time)  Medications Ordered in ED Medications  ketorolac (TORADOL) 15 MG/ML injection 15 mg (has no administration in time range)  sodium chloride 0.9 % bolus 1,000 mL (has no administration in time range)     Initial Impression / Assessment and Plan / ED Course  I have reviewed the triage vital signs and the nursing notes.  Pertinent labs & imaging results that were available during my care of the patient were reviewed by me and considered in my medical decision making (see chart for details).  Clinical Course as of Oct 04 1206  Mon Oct 04, 2018  [id number] 33 year old female here with syncope with amnesia.  Unsure the cause of her fall.  It does  not appear that she has any severe head injury or even hit her head at all.  She does have history of syncope in the past.  Denies any drug or alcohol use.  Going  to do a work-up including labs, urinalysis, urine pregnancy, CT of her head and x-ray of her foot.  We will get an EKG and orthostatic vital signs.  We will give her some Toradol and bolus of fluids.  She is mildly tachycardic to 112 otherwise her vital signs are normal and she appears very well on exam without confusion.    [KM]  1107 I reviewed patient's cardiology notes in the past.  Had negative echo and holter monitor in 2015 and again a negative cardiac eval in 2018. She was seen for tachycardia and syncope those visits and they were thought to be driven by anxiety.    [KM]  1207 Patient work-up is unremarkable including CBC CMP, urinalysis, orthostatics, head CT and foot x-ray.  I suspect this is a similar episode to her episode of syncope in the past related to anxiety.  Advised her to follow-up with her primary care doctor and her cardiologist.  We will send her home with Robaxin and naproxen.  Advised on strict return precautions   [KM]    Clinical Course User Index [KM] Arlyn Dunning, PA-C       Based on review of vitals, medical screening exam, lab work and/or imaging, there does not appear to be an acute, emergent etiology for the patient's symptoms. Counseled pt on good return precautions and encouraged both PCP and ED follow-up as needed.  Prior to discharge, I also discussed incidental imaging findings with patient in detail and advised appropriate, recommended follow-up in detail.  Clinical Impression: 1. Syncope, unspecified syncope type   2. Acute nonintractable headache, unspecified headache type   3. Sprain of left foot, initial encounter     Disposition: Discharge  Prior to providing a prescription for a controlled substance, I independently reviewed the patient's recent prescription history on the [place] Controlled Substance Reporting System. The patient had no recent or regular prescriptions and was deemed appropriate for a brief, less than 3 day  prescription of narcotic for acute analgesia.  This note was prepared with assistance of Conservation officer, historic buildings. Occasional wrong-word or sound-a-like substitutions may have occurred due to the inherent limitations of voice recognition software.   Final Clinical Impressions(s) / ED Diagnoses   Final diagnoses:  None    ED Discharge Orders    None       Jeral Pinch 10/04/18 1210    Gwyneth Sprout, MD 10/04/18 1500

## 2018-10-07 DIAGNOSIS — F32A Depression, unspecified: Secondary | ICD-10-CM | POA: Diagnosis present

## 2018-10-07 DIAGNOSIS — F419 Anxiety disorder, unspecified: Secondary | ICD-10-CM | POA: Insufficient documentation

## 2018-12-29 ENCOUNTER — Encounter (HOSPITAL_COMMUNITY): Payer: Self-pay | Admitting: Emergency Medicine

## 2018-12-29 ENCOUNTER — Emergency Department (HOSPITAL_COMMUNITY): Payer: Medicaid Other

## 2018-12-29 ENCOUNTER — Emergency Department (HOSPITAL_COMMUNITY)
Admission: EM | Admit: 2018-12-29 | Discharge: 2018-12-29 | Disposition: A | Discharge status: Home / Self Care | Payer: Medicaid Other | Attending: Emergency Medicine | Admitting: Emergency Medicine

## 2018-12-29 ENCOUNTER — Other Ambulatory Visit: Payer: Self-pay

## 2018-12-29 DIAGNOSIS — J45909 Unspecified asthma, uncomplicated: Secondary | ICD-10-CM | POA: Diagnosis not present

## 2018-12-29 DIAGNOSIS — F17210 Nicotine dependence, cigarettes, uncomplicated: Secondary | ICD-10-CM | POA: Diagnosis not present

## 2018-12-29 DIAGNOSIS — T7840XA Allergy, unspecified, initial encounter: Secondary | ICD-10-CM | POA: Insufficient documentation

## 2018-12-29 DIAGNOSIS — R21 Rash and other nonspecific skin eruption: Secondary | ICD-10-CM | POA: Diagnosis not present

## 2018-12-29 DIAGNOSIS — R0602 Shortness of breath: Secondary | ICD-10-CM | POA: Diagnosis present

## 2018-12-29 DIAGNOSIS — Z79899 Other long term (current) drug therapy: Secondary | ICD-10-CM | POA: Diagnosis not present

## 2018-12-29 DIAGNOSIS — R52 Pain, unspecified: Secondary | ICD-10-CM

## 2018-12-29 MED ORDER — HYDROXYZINE HCL 10 MG/5ML PO SYRP
50.0000 mg | ORAL_SOLUTION | Freq: Once | ORAL | Status: DC
  Filled 2018-12-29: qty 25

## 2018-12-29 MED ORDER — IBUPROFEN 800 MG PO TABS
800.0000 mg | ORAL_TABLET | Freq: Once | ORAL | Status: AC
  Administered 2018-12-29: 800 mg via ORAL
  Filled 2018-12-29: qty 1

## 2018-12-29 MED ORDER — FAMOTIDINE 20 MG PO TABS: 20.0000 mg | ORAL_TABLET | Freq: Two times a day (BID) | ORAL | 0 refills | Status: DC

## 2018-12-29 MED ORDER — MORPHINE SULFATE (PF) 4 MG/ML IV SOLN
4.0000 mg | Freq: Once | INTRAVENOUS | Status: AC
  Administered 2018-12-29: 22:00:00 4 mg via INTRAVENOUS
  Filled 2018-12-29: qty 1

## 2018-12-29 MED ORDER — HYDROXYZINE HCL 10 MG/5ML PO SYRP
50.0000 mg | ORAL_SOLUTION | Freq: Once | ORAL | Status: AC
  Administered 2018-12-29: 50 mg via ORAL
  Filled 2018-12-29: qty 25

## 2018-12-29 MED ORDER — FAMOTIDINE IN NACL 20-0.9 MG/50ML-% IV SOLN
20.0000 mg | Freq: Once | INTRAVENOUS | Status: AC
  Administered 2018-12-29: 20 mg via INTRAVENOUS
  Filled 2018-12-29: qty 50

## 2018-12-29 MED ORDER — ONDANSETRON HCL 4 MG/2ML IJ SOLN
4.0000 mg | Freq: Once | INTRAMUSCULAR | Status: AC
  Administered 2018-12-29: 4 mg via INTRAVENOUS
  Filled 2018-12-29: qty 2

## 2018-12-29 MED ORDER — SODIUM CHLORIDE 0.9 % IV BOLUS
1000.0000 mL | Freq: Once | INTRAVENOUS | Status: AC
  Administered 2018-12-29: 1000 mL via INTRAVENOUS

## 2018-12-29 MED ORDER — ONDANSETRON 4 MG PO TBDP: 4.0000 mg | ORAL_TABLET | Freq: Three times a day (TID) | ORAL | 0 refills | Status: DC | PRN

## 2018-12-29 MED ORDER — HYDROXYZINE PAMOATE 50 MG PO CAPS: 50.0000 mg | ORAL_CAPSULE | Freq: Four times a day (QID) | ORAL | 0 refills | Status: DC | PRN

## 2018-12-29 MED ORDER — PREDNISONE 10 MG PO TABS: 20.0000 mg | ORAL_TABLET | Freq: Two times a day (BID) | ORAL | 0 refills | Status: AC

## 2018-12-29 MED ORDER — ALUM & MAG HYDROXIDE-SIMETH 200-200-20 MG/5ML PO SUSP
30.0000 mL | Freq: Once | ORAL | Status: AC
  Administered 2018-12-29: 30 mL via ORAL
  Filled 2018-12-29: qty 30

## 2018-12-29 MED ORDER — KETOROLAC TROMETHAMINE 15 MG/ML IJ SOLN
15.0000 mg | Freq: Once | INTRAMUSCULAR | Status: AC
  Administered 2018-12-29: 15 mg via INTRAVENOUS
  Filled 2018-12-29: qty 1

## 2018-12-29 MED ORDER — METHYLPREDNISOLONE SODIUM SUCC 125 MG IJ SOLR
125.0000 mg | Freq: Once | INTRAMUSCULAR | Status: AC
  Administered 2018-12-29: 125 mg via INTRAVENOUS
  Filled 2018-12-29: qty 2

## 2018-12-29 MED ORDER — LIDOCAINE VISCOUS HCL 2 % MT SOLN
15.0000 mL | Freq: Once | OROMUCOSAL | Status: AC
  Administered 2018-12-29: 15 mL via OROMUCOSAL
  Filled 2018-12-29: qty 15

## 2018-12-29 NOTE — ED Notes (Signed)
Patient transported to X-ray 

## 2018-12-29 NOTE — ED Provider Notes (Signed)
[facility] DEPT Provider Note   CSN: [id number] Arrival date & time: 12/29/18  1739    History   Chief Complaint Chief Complaint  Patient presents with   Allergic Reaction    HPI Diane Stein is a 33 y.o. female past medical history significant for tachycardia presents to emergency department today with chief complaint allergic reaction.  Onset was acute happening just prior to arrival.  Patient states she works at [employer] and a bottle of conditioner fell and opened causing her to get some on her skin.  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  She states she started to feel short of breath and had hives all over her body.  She was given epi with minimal symptom relief.  EMS was called and on arrival give a second round of epi and Benadryl.  Patient states she has an allergy to Benadryl saying that it makes her her restless leg syndrome.    On arrival to ED she states shortness of breath and nausea improved.  She is complaining of aching right knee pain. She states this has been going on for months. She also had a recent fall, denies hitting her head, no LOC.  She states she is supposed to follow-up with her primary care doctor next week for evaluation of right knee pain.  She denies fever, chills, swelling of face or tongue, abdominal pain, nausea, vomiting. History provided by patient with additional history obtained from chart review.     Past Medical History:  Diagnosis Date   Anxiety    Asthma    as a child   Bronchitis    history   Depression    Dysrhythmia    rapid heartrate   Pneumonia 2014   History   Smoker    0.5 ppd x 10 yrs   SVD (spontaneous vaginal delivery)    x 1   Tachycardia 2016    Patient Active Problem List   Diagnosis Date Noted   Post-op pneumonia 03/14/2018   S/P TAH-BSO (total abdominal hysterectomy and bilateral salpingo-oophorectomy)  03/10/2018   Pelvic pain in female 08/28/2017   Paratubal cyst 08/28/2017   Chest pain at rest 12/26/2016   Dizziness 12/26/2016   Syncope and collapse 08/23/2016   Depression    Anxiety    SVD (spontaneous vaginal delivery) 11/08/2014   Irregular uterine contractions 11/07/2014   Palpitations 05/12/2014   Paroxysmal supraventricular tachycardia ([place]) 05/12/2014   Pregnancy 05/12/2014    Past Surgical History:  Procedure Laterality Date   HYSTERECTOMY ABDOMINAL WITH SALPINGO-OOPHORECTOMY Bilateral 03/10/2018   Procedure: TOTAL ABDOMINAL HYSTERECTOMY WITH BILATERAL SALPINGO-OOPHORECTOMY;  Surgeon: Cheri Fowler, MD;  Location: [facility];  Service: Gynecology;  Laterality: Bilateral;   LAPAROSCOPIC UNILATERAL SALPINGECTOMY Left 08/28/2017   Procedure: LAPAROSCOPIC UNILATERAL SALPINGECTOMY WITH REMOVAL OF PARA TUBAL CYST;  Surgeon: Cheri Fowler, MD;  Location: [facility];  Service: Gynecology;  Laterality: Left;   LAPAROSCOPY Left 08/28/2017   Procedure: LAPAROSCOPY OPERATIVE, FULGERATION OF ENDOMETRIOSIS;  Surgeon: Cheri Fowler, MD;  Location: [facility];  Service: Gynecology;  Laterality: Left;   OVARIAN CYST SURGERY     TOOTH EXTRACTION Right 2008     OB History    Gravida  5   Para  1   Term  1   Preterm      AB  3   Living  1     SAB  3   TAB  Ectopic      Multiple  0   Live Births  1            Home Medications    Prior to Admission medications   Medication Sig Start Date End Date Taking? Authorizing Provider  ALPRAZolam (XANAX XR) 1 MG 24 hr tablet Take 1 mg by mouth daily. Takes first thing in the morning   Yes [provider]  alprazolam Prudy Feeler) 2 MG tablet Take 2 mg by mouth 2 (two) times daily. Takes 1st dose at 77 Noon and second dose a few hours later   Yes [provider]  doxepin (SINEQUAN) 50 MG capsule Take 50-100 mg by mouth at bedtime as needed (sleep).    Yes [provider]  estradiol (ESTRACE)  2 MG tablet Take 2 mg by mouth daily. 11/30/18  Yes [provider]  FLUoxetine (PROZAC) 20 MG capsule Take 60 mg by mouth daily.   Yes [provider]  albuterol (VENTOLIN HFA) 108 (90 Base) MCG/ACT inhaler Inhale 2 puffs into the lungs every 6 (six) hours as needed for wheezing or shortness of breath.  09/13/18   [provider]  azithromycin (ZITHROMAX) 250 MG tablet Take one tablet daily until gone Patient not taking: Reported on 12/29/2018 03/17/18   Meisinger, Tawanna Cooler, MD  cefixime (SUPRAX) 400 MG CAPS capsule Take 1 capsule (400 mg total) by mouth daily. Patient not taking: Reported on 12/29/2018 03/17/18   Meisinger, Tawanna Cooler, MD  dicyclomine (BENTYL) 20 MG tablet Take 1 tablet (20 mg total) by mouth 2 (two) times daily as needed for up to 4 days for spasms. 04/22/18 04/26/18  Little, Ambrose Finland, MD  EPINEPHrine 0.3 mg/0.3 mL IJ SOAJ injection Inject 0.3 mLs (0.3 mg total) into the muscle once as needed for up to 1 dose (anaphalaxis). 01/09/18   Loren Racer, MD  famotidine (PEPCID) 20 MG tablet Take 1 tablet (20 mg total) by mouth 2 (two) times daily for 6 days. 12/29/18 01/04/19  Loraine Bhullar E, PA-C  hydrOXYzine (VISTARIL) 50 MG capsule Take 1 capsule (50 mg total) by mouth 4 (four) times daily as needed for itching or vomiting. 12/29/18   Kathlyne Loud, Yvonna Alanis E, PA-C  ibuprofen (ADVIL,MOTRIN) 600 MG tablet Take 1 tablet (600 mg total) by mouth every 6 (six) hours as needed (mild pain). Patient not taking: Reported on 12/29/2018 03/12/18   Meisinger, Tawanna Cooler, MD  methocarbamol (ROBAXIN) 500 MG tablet Take 1 tablet (500 mg total) by mouth 2 (two) times daily. Patient not taking: Reported on 12/29/2018 10/04/18   Ronnie Doss A, PA-C  ondansetron (ZOFRAN ODT) 4 MG disintegrating tablet Take 1 tablet (4 mg total) by mouth every 8 (eight) hours as needed for nausea or vomiting. 12/29/18   Sorah Falkenstein, Yvonna Alanis E, PA-C  oxyCODONE (ROXICODONE) 5 MG immediate release tablet Take 1 tablet (5 mg  total) by mouth every 4 (four) hours as needed for severe pain. Patient not taking: Reported on 12/29/2018 03/12/18   Meisinger, Tawanna Cooler, MD  predniSONE (DELTASONE) 10 MG tablet Take 2 tablets (20 mg total) by mouth 2 (two) times daily for 3 days. 12/29/18 01/01/19  Dilynn Munroe, Caroleen Hamman, PA-C    Family History Family History  Problem Relation Age of Onset   Hypertension Mother    Heart disease Father    Heart disease Maternal Grandmother    Hypertension Maternal Grandmother    Heart disease Maternal Grandfather    Hypertension Maternal Grandfather    Heart disease Paternal Grandmother  Hypertension Paternal Grandmother    Heart disease Paternal Grandfather    Hypertension Paternal Grandfather     Social History Social History   Tobacco Use   Smoking status: Current Every Day Smoker    Packs/day: 0.50    Years: 10.00    Pack years: 5.00    Types: Cigarettes   Smokeless tobacco: Never Used  Substance Use Topics   Alcohol use: Yes    Alcohol/week: 5.0 standard drinks    Types: 5 Glasses of wine per week   Drug use: No     Allergies   Avocado, Other, Benadryl [diphenhydramine hcl], and Benadryl [diphenhydramine]   Review of Systems Review of Systems  Constitutional: Negative for chills and fever.  HENT: Negative for congestion, ear discharge, ear pain, sinus pressure, sinus pain and sore throat.   Eyes: Negative for pain and redness.  Respiratory: Positive for shortness of breath. Negative for cough.   Cardiovascular: Negative for chest pain.  Gastrointestinal: Negative for abdominal pain, constipation, diarrhea, nausea and vomiting.  Genitourinary: Negative for dysuria and hematuria.  Musculoskeletal: Positive for arthralgias. Negative for back pain and neck pain.  Skin: Positive for rash. Negative for wound.  Neurological: Negative for weakness, numbness and headaches.     Physical Exam Updated Vital Signs BP 119/78    Pulse 97    Temp 98.1 F (36.7 C)  (Oral)    Resp (!) 26    LMP  (LMP Unknown) Comment: Cont. OCP   SpO2 96%   Physical Exam Vitals signs and nursing note reviewed.  Constitutional:      General: She is not in acute distress.    Appearance: She is not ill-appearing.  HENT:     Head: Normocephalic and atraumatic.     Comments: No swelling of face. No intraoral swelling. Pt is handling secretions.    Right Ear: External ear normal.     Left Ear: External ear normal.     Nose: Nose normal.     Mouth/Throat:     Mouth: Mucous membranes are moist.     Pharynx: Oropharynx is clear.  Eyes:     General: No scleral icterus.       Right eye: No discharge.        Left eye: No discharge.     Conjunctiva/sclera: Conjunctivae normal.     Pupils: Pupils are equal, round, and reactive to light.  Neck:     Musculoskeletal: Normal range of motion.     Vascular: No JVD.  Cardiovascular:     Rate and Rhythm: Regular rhythm. Tachycardia present.     Pulses: Normal pulses.          Radial pulses are 2+ on the right side and 2+ on the left side.       Dorsalis pedis pulses are 2+ on the right side and 2+ on the left side.     Heart sounds: Normal heart sounds.  Pulmonary:     Comments: Lungs clear to auscultation in all fields. Symmetric chest rise. No wheezing, rales, or rhonchi. SpO2 is 98% on room air Abdominal:     Comments: Abdomen is soft, non-distended, and non-tender in all quadrants. No rigidity, no guarding. No peritoneal signs.  Musculoskeletal:     Right hip: Normal.     Right knee: She exhibits decreased range of motion and bony tenderness. She exhibits no swelling, no ecchymosis, no deformity, no laceration, no erythema, no LCL laxity and no MCL laxity.  Right ankle: Normal.     Right lower leg: No edema.     Left lower leg: No edema.  Skin:    General: Skin is warm and dry.     Capillary Refill: Capillary refill takes less than 2 seconds.     Findings: Rash present.     Comments: Erythematous macular rash to  anterior chest and bilateral forearms.  Neurological:     Mental Status: She is oriented to person, place, and time.     GCS: GCS eye subscore is 4. GCS verbal subscore is 5. GCS motor subscore is 6.     Comments: Fluent speech, no facial droop.  Psychiatric:        Mood and Affect: Mood is anxious.        Behavior: Behavior normal.      ED Treatments / Results  Labs (all labs ordered are listed, but only abnormal results are displayed) Labs Reviewed - No data to display  EKG None  Radiology Dg Chest 2 View  Result Date: 12/29/2018 CLINICAL DATA:  Shortness of breath, itching and allergic reaction. EXAM: CHEST - 2 VIEW COMPARISON:  09/13/2018 FINDINGS: The heart size and mediastinal contours are within normal limits. There is no evidence of pulmonary edema, consolidation, pneumothorax, nodule or pleural fluid. The visualized skeletal structures are unremarkable. IMPRESSION: No active cardiopulmonary disease. Electronically Signed   By: Irish LackGlenn  Yamagata M.D.   On: 12/29/2018 18:32   Dg Knee Complete 4 Views Right  Result Date: 12/29/2018 CLINICAL DATA:  33 year old female right knee pain history of recurring effusions. Anterior patellar pain. EXAM: RIGHT KNEE - COMPLETE 4+ VIEW COMPARISON:  None. FINDINGS: No evidence of fracture, dislocation, or joint effusion. No evidence of arthropathy or other focal bone abnormality. Soft tissues are unremarkable. IMPRESSION: Negative. Electronically Signed   By: Kreg ShropshirePrice  DeHay M.D.   On: 12/29/2018 19:17    Procedures Procedures (including critical care time)  Medications Ordered in ED Medications  methylPREDNISolone sodium succinate (SOLU-MEDROL) 125 mg/2 mL injection 125 mg (125 mg Intravenous Given 12/29/18 1823)  famotidine (PEPCID) IVPB 20 mg premix (0 mg Intravenous Stopped 12/29/18 1917)  ibuprofen (ADVIL) tablet 800 mg (800 mg Oral Given 12/29/18 1824)  sodium chloride 0.9 % bolus 1,000 mL (0 mLs Intravenous Stopped 12/29/18 2029)  lidocaine  (XYLOCAINE) 2 % viscous mouth solution 15 mL (15 mLs Mouth/Throat Given 12/29/18 1916)  alum & mag hydroxide-simeth (MAALOX/MYLANTA) 200-200-20 MG/5ML suspension 30 mL (30 mLs Oral Given 12/29/18 2011)  ketorolac (TORADOL) 15 MG/ML injection 15 mg (15 mg Intravenous Given 12/29/18 2011)  hydrOXYzine (ATARAX) 10 MG/5ML syrup 50 mg (50 mg Oral Given 12/29/18 2105)  morphine 4 MG/ML injection 4 mg (4 mg Intravenous Given 12/29/18 2132)  ondansetron (ZOFRAN) injection 4 mg (4 mg Intravenous Given 12/29/18 2132)     Initial Impression / Assessment and Plan / ED Course  I have reviewed the triage vital signs and the nursing notes.  Pertinent labs & imaging results that were available during my care of the patient were reviewed by me and considered in my medical decision making (see chart for details).  Pt is non toxic in appearance, afebrile. She is in no acute distress. She is tachycardic to 106. Airway is intact. She is speaking in full sentences. She has rash to bilateral arms and chest. IVF, IV solumedrol and Pepcid given. Chest xray viewed by me is without infiltrate. Right knee xray is negative for fracture or dislocation. Pt is reporting feeling anxious and  jittery, vistaril given. She is requesting Dilaudid for her knee pain. I discussed with pt narcotics are not used with chronic pain. She voices understanding however asked multiple times throughout ED stay for dilaudid. She is also requesting IV medications as she feels the ibuprofen she took earlier got stuck in her throat. She is tolerating oral secretions without difficulty. I saw pt drinking 2 cups of water without emesis.  Pt observed for 4 hours after epi administration. Patient is hemodynamically stable and in no respiratory distress on reassessment. Reports improvement of symptoms and denies any sensation of throat closing. Tachycardia improved. Will discharge home with prednisone and pepcid. I saw pt just prior to discharge and she has normal  respiratory rate. I suspect respiratory rate of 26 is inaccurate. Advised patient to return to the ED if they experience any throat closing, difficulty breathing, tongue or lip swelling. Instructed patient to follow-up with their PCP. Pt is stable to be discharged home.  Findings and plan of care discussed with supervising physician Dr. Particia NearingHaviland.   Final Clinical Impressions(s) / ED Diagnoses   Final diagnoses:  Allergic reaction, initial encounter    ED Discharge Orders         Ordered    predniSONE (DELTASONE) 10 MG tablet  2 times daily     12/29/18 2220    famotidine (PEPCID) 20 MG tablet  2 times daily     12/29/18 2220    ondansetron (ZOFRAN ODT) 4 MG disintegrating tablet  Every 8 hours PRN     12/29/18 2220    hydrOXYzine (VISTARIL) 50 MG capsule  4 times daily PRN     12/29/18 2221           Sherene Sireslbrizze, Audrinna Sherman E, PA-C 12/30/18 0335    Jacalyn LefevreHaviland, Julie, MD 12/30/18 1510

## 2018-12-29 NOTE — ED Notes (Addendum)
Patient ambulated to the bathroom w/ a steady gate.   Patient complained of dizziness upon arrival back to room. Patient had to kneel on one knee to regain her balance.

## 2018-12-29 NOTE — ED Notes (Addendum)
Patient stated that she feels like the ibuprofen given to her is stuck in throat. Patient doesn't appear to be in distress. VS are WDL. This Probation officer will make provider aware.

## 2018-12-29 NOTE — ED Triage Notes (Signed)
Patient arrived by EMS from work. Pt opened busted avocado bottle at work. Pt c/o SOB and itching.   Pt is allergic to avocado.   Pt took EPI Pen. Pt had relief after initial Epi.   EMS gave second Epi and Benadryl.

## 2018-12-29 NOTE — Discharge Instructions (Addendum)
You have been seen today for allergic reaction. Please read and follow all provided instructions. Return to the emergency room for worsening condition or new concerning symptoms.    1. Medications:  Prescriptions sent to your pharmacy for prednisone and Pepcid.  Please take as prescribed. -Prescription also sent for Zofran, you can take as needed for nausea. -Prescription also sent for Vistaril.  This is a medicine you took earlier for itching.  Please take as needed and prescribed.  Continue usual home medications Take medications as prescribed. Please review all of the medicines and only take them if you do not have an allergy to them.   2. Treatment: rest, drink plenty of fluids  3. Follow Up: Follow-up with primary care doctor at your scheduled appointment on Monday.  It is also a possibility that you have an allergic reaction to any of the medicines that you have been prescribed - Everybody reacts differently to medications and while MOST people have no trouble with most medicines, you may have a reaction such as nausea, vomiting, rash, swelling, shortness of breath. If this is the case, please stop taking the medicine immediately and contact your physician.  ?

## 2018-12-31 DIAGNOSIS — F17210 Nicotine dependence, cigarettes, uncomplicated: Secondary | ICD-10-CM | POA: Insufficient documentation

## 2019-01-02 ENCOUNTER — Encounter (HOSPITAL_BASED_OUTPATIENT_CLINIC_OR_DEPARTMENT_OTHER): Payer: Self-pay

## 2019-01-02 ENCOUNTER — Emergency Department (HOSPITAL_BASED_OUTPATIENT_CLINIC_OR_DEPARTMENT_OTHER)
Admission: EM | Admit: 2019-01-02 | Discharge: 2019-01-02 | Disposition: A | Discharge status: Home / Self Care | Payer: Medicaid Other | Attending: Emergency Medicine | Admitting: Emergency Medicine

## 2019-01-02 ENCOUNTER — Emergency Department (HOSPITAL_BASED_OUTPATIENT_CLINIC_OR_DEPARTMENT_OTHER): Payer: Medicaid Other

## 2019-01-02 ENCOUNTER — Other Ambulatory Visit: Payer: Self-pay

## 2019-01-02 DIAGNOSIS — M25571 Pain in right ankle and joints of right foot: Secondary | ICD-10-CM | POA: Insufficient documentation

## 2019-01-02 DIAGNOSIS — M546 Pain in thoracic spine: Secondary | ICD-10-CM | POA: Diagnosis not present

## 2019-01-02 DIAGNOSIS — J45909 Unspecified asthma, uncomplicated: Secondary | ICD-10-CM | POA: Insufficient documentation

## 2019-01-02 DIAGNOSIS — W109XXA Fall (on) (from) unspecified stairs and steps, initial encounter: Secondary | ICD-10-CM | POA: Diagnosis not present

## 2019-01-02 DIAGNOSIS — M545 Low back pain: Secondary | ICD-10-CM | POA: Diagnosis not present

## 2019-01-02 DIAGNOSIS — F17210 Nicotine dependence, cigarettes, uncomplicated: Secondary | ICD-10-CM | POA: Diagnosis not present

## 2019-01-02 DIAGNOSIS — W19XXXA Unspecified fall, initial encounter: Secondary | ICD-10-CM

## 2019-01-02 MED ORDER — NAPROXEN 500 MG PO TABS: 500.0000 mg | ORAL_TABLET | Freq: Two times a day (BID) | ORAL | 0 refills | Status: AC

## 2019-01-02 MED ORDER — METHOCARBAMOL 500 MG PO TABS: 500.0000 mg | ORAL_TABLET | Freq: Two times a day (BID) | ORAL | 0 refills | Status: AC

## 2019-01-02 NOTE — ED Provider Notes (Signed)
[facility] EMERGENCY DEPARTMENT Provider Note   CSN: [id number] Arrival date & time: 01/02/19  1257    History   Chief Complaint Chief Complaint  Patient presents with  . Fall    HPI Diane Stein is a 33 y.o. female.     33 y.o female with a PMH of Anxiety, Depression is in the ED status post mechanical fall at work.  Patient reports going down the steps and fell approximately 3 steps on a staircase, landing on her thoracic back, states she also felt her right ankle twist.  Patient has ambulated since the accident, however reports significant amount of pain around the thoracic and lumbar region, has not taken any medication for relieving symptoms.  She also endorses swelling to her right ankle, states pain is worse with dorsi flexion along with ambulation.  She has not taken any medication for relieving symptoms.  Denies any bowel or bladder incontinence, headache, LOC or other complaints.  The history is provided by the patient.    Past Medical History:  Diagnosis Date  . Anxiety   . Asthma    as a child  . Bronchitis    history  . Depression   . Dysrhythmia    rapid heartrate  . Pneumonia 2014   History  . Smoker    0.5 ppd x 10 yrs  . SVD (spontaneous vaginal delivery)    x 1  . Tachycardia 2016    Patient Active Problem List   Diagnosis Date Noted  . Post-op pneumonia 03/14/2018  . 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 Pelvic pain in female 08/28/2017  . Paratubal cyst 08/28/2017  . Chest pain at rest 12/26/2016  . Dizziness 12/26/2016  . Syncope and collapse 08/23/2016  . Depression   . Anxiety   . SVD (spontaneous vaginal delivery) 11/08/2014  . Irregular uterine contractions 11/07/2014  . Palpitations 05/12/2014  . Paroxysmal supraventricular tachycardia (HCC) 05/12/2014  . Pregnancy 05/12/2014    Past Surgical History:  Procedure Laterality Date  . ABDOMINAL HYSTERECTOMY     . HYSTERECTOMY ABDOMINAL WITH SALPINGO-OOPHORECTOMY Bilateral 03/10/2018   Procedure: TOTAL ABDOMINAL HYSTERECTOMY WITH BILATERAL SALPINGO-OOPHORECTOMY;  Surgeon: Lavina HammanMeisinger, Todd, MD;  Location: [facility];  Service: Gynecology;  Laterality: Bilateral;  . LAPAROSCOPIC UNILATERAL SALPINGECTOMY Left 08/28/2017   Procedure: LAPAROSCOPIC UNILATERAL SALPINGECTOMY WITH REMOVAL OF PARA TUBAL CYST;  Surgeon: Lavina HammanMeisinger, Todd, MD;  Location: [facility];  Service: Gynecology;  Laterality: Left;  . LAPAROSCOPY Left 08/28/2017   Procedure: LAPAROSCOPY OPERATIVE, FULGERATION OF ENDOMETRIOSIS;  Surgeon: Lavina HammanMeisinger, Todd, MD;  Location: [facility];  Service: Gynecology;  Laterality: Left;  . OVARIAN CYST SURGERY    . TOOTH EXTRACTION Right 2008     OB History    Gravida  5   Para  1   Term  1   Preterm      AB  3   Living  1     SAB  3   TAB      Ectopic      Multiple  0   Live Births  1            Home Medications    Prior to Admission medications   Medication Sig Start Date End Date Taking? Authorizing Provider  albuterol (VENTOLIN HFA) 108 (90 Base) MCG/ACT inhaler Inhale 2 puffs into the lungs every 6 (six) hours as needed for wheezing or shortness of breath.  09/13/18   [provider]  ALPRAZolam Prudy Feeler(XANAX  XR) 1 MG 24 hr tablet Take 1 mg by mouth daily. Takes first thing in the morning    [provider]  alprazolam Prudy Feeler(XANAX) 2 MG tablet Take 2 mg by mouth 2 (two) times daily. Takes 1st dose at 7912 Noon and second dose a few hours later    [provider]  azithromycin (ZITHROMAX) 250 MG tablet Take one tablet daily until gone Patient not taking: Reported on 12/29/2018 03/17/18   Meisinger, Tawanna Coolerodd, MD  cefixime (SUPRAX) 400 MG CAPS capsule Take 1 capsule (400 mg total) by mouth daily. Patient not taking: Reported on 12/29/2018 03/17/18   Meisinger, Tawanna Coolerodd, MD  dicyclomine (BENTYL) 20 MG tablet Take 1 tablet (20 mg total) by mouth 2 (two) times daily as needed for up to 4 days for  spasms. 04/22/18 04/26/18  Little, Ambrose Finlandachel Morgan, MD  doxepin (SINEQUAN) 50 MG capsule Take 50-100 mg by mouth at bedtime as needed (sleep).     [provider]  EPINEPHrine 0.3 mg/0.3 mL IJ SOAJ injection Inject 0.3 mLs (0.3 mg total) into the muscle once as needed for up to 1 dose (anaphalaxis). 01/09/18   Loren RacerYelverton, David, MD  estradiol (ESTRACE) 2 MG tablet Take 2 mg by mouth daily. 11/30/18   [provider]  famotidine (PEPCID) 20 MG tablet Take 1 tablet (20 mg total) by mouth 2 (two) times daily for 6 days. 12/29/18 01/04/19  Albrizze, Kaitlyn E, PA-C  FLUoxetine (PROZAC) 20 MG capsule Take 60 mg by mouth daily.    [provider]  hydrOXYzine (VISTARIL) 50 MG capsule Take 1 capsule (50 mg total) by mouth 4 (four) times daily as needed for itching or vomiting. 12/29/18   Albrizze, Yvonna AlanisKaitlyn E, PA-C  ibuprofen (ADVIL,MOTRIN) 600 MG tablet Take 1 tablet (600 mg total) by mouth every 6 (six) hours as needed (mild pain). Patient not taking: Reported on 12/29/2018 03/12/18   Meisinger, Tawanna Coolerodd, MD  methocarbamol (ROBAXIN) 500 MG tablet Take 1 tablet (500 mg total) by mouth 2 (two) times daily for 7 days. 01/02/19 01/09/19  Claude MangesSoto, Dariane Natzke, PA-C  naproxen (NAPROSYN) 500 MG tablet Take 1 tablet (500 mg total) by mouth 2 (two) times daily for 7 days. 01/02/19 01/09/19  Claude MangesSoto, Gabbie Marzo, PA-C  ondansetron (ZOFRAN ODT) 4 MG disintegrating tablet Take 1 tablet (4 mg total) by mouth every 8 (eight) hours as needed for nausea or vomiting. 12/29/18   Albrizze, Yvonna AlanisKaitlyn E, PA-C  oxyCODONE (ROXICODONE) 5 MG immediate release tablet Take 1 tablet (5 mg total) by mouth every 4 (four) hours as needed for severe pain. Patient not taking: Reported on 12/29/2018 03/12/18   Lavina HammanMeisinger, Todd, MD    Family History Family History  Problem Relation Age of Onset  . Hypertension Mother   . Heart disease Father   . Heart disease Maternal Grandmother   . Hypertension Maternal Grandmother   . Heart disease Maternal  Grandfather   . Hypertension Maternal Grandfather   . Heart disease Paternal Grandmother   . Hypertension Paternal Grandmother   . Heart disease Paternal Grandfather   . Hypertension Paternal Grandfather     Social History Social History   Tobacco Use  . Smoking status: Current Every Day Smoker    Packs/day: 0.50    Years: 10.00    Pack years: 5.00    Types: Cigarettes  . Smokeless tobacco: Never Used  Substance Use Topics  . Alcohol use: Yes    Alcohol/week: 5.0 standard drinks    Types: 5 Glasses of wine  per week  . Drug use: No     Allergies   Avocado, Other, Benadryl [diphenhydramine hcl], and Benadryl [diphenhydramine]   Review of Systems Review of Systems  Constitutional: Negative for fever.  Musculoskeletal: Positive for back pain and myalgias.     Physical Exam Updated Vital Signs BP 114/70 (BP Location: Right Arm)   Pulse 73   Temp 98.3 F (36.8 C) (Oral)   Resp 18   Ht 5\' 4"  (1.626 m)   Wt 81.6 kg   LMP  (LMP Unknown) Comment: Cont. OCP  SpO2 100%   BMI 30.90 kg/m   Physical Exam Vitals signs and nursing note reviewed.  Constitutional:      General: She is not in acute distress.    Appearance: Normal appearance. She is well-developed.  HENT:     Head: Normocephalic and atraumatic.     Mouth/Throat:     Pharynx: No oropharyngeal exudate.  Eyes:     Pupils: Pupils are equal, round, and reactive to light.  Neck:     Musculoskeletal: Normal range of motion.  Cardiovascular:     Rate and Rhythm: Regular rhythm.     Heart sounds: Normal heart sounds.  Pulmonary:     Effort: Pulmonary effort is normal. No respiratory distress.     Breath sounds: Normal breath sounds.  Abdominal:     General: Bowel sounds are normal. There is no distension.     Palpations: Abdomen is soft.     Tenderness: There is no abdominal tenderness.  Musculoskeletal:        General: No deformity.     Thoracic back: She exhibits tenderness, bony tenderness, pain and  spasm. She exhibits no edema, no deformity and no laceration.     Right lower leg: No edema.     Left lower leg: No edema.     Comments: Midline tenderness noted along the thoracic region.  Patient reports some tenderness along the paraspinal lumbar region.  No obvious deformity, changes in the skin, erythema or swelling noted.  Skin:    General: Skin is warm and dry.  Neurological:     Mental Status: She is alert and oriented to person, place, and time.     Comments: RLE- KF,KE 5/5 strength LLE- HF, HE 5/5 strength Normal gait. No pronator drift. No leg drop.  Patellar reflexes present and symmetric. CN I, II and VIII not tested. CN II-XII grossly intact bilaterally.         ED Treatments / Results  Labs (all labs ordered are listed, but only abnormal results are displayed) Labs Reviewed - No data to display  EKG None  Radiology Dg Thoracic Spine W/swimmers  Result Date: 01/02/2019 CLINICAL DATA:  Pain after fall EXAM: THORACIC SPINE - 3 VIEWS COMPARISON:  August 25, 2016 FINDINGS: Mild degenerative disc disease with tiny anterior osteophytes. No fracture or traumatic malalignment. IMPRESSION: Negative. Electronically Signed   By: Dorise Bullion III M.D   On: 01/02/2019 15:58   Dg Lumbar Spine Complete  Result Date: 01/02/2019 CLINICAL DATA:  Pain after fall EXAM: LUMBAR SPINE - COMPLETE 4+ VIEW COMPARISON:  None. FINDINGS: There is no evidence of lumbar spine fracture. Alignment is normal. Intervertebral disc spaces are maintained. IMPRESSION: Negative. Electronically Signed   By: Dorise Bullion III M.D   On: 01/02/2019 15:54   Dg Ankle Complete Right  Result Date: 01/02/2019 CLINICAL DATA:  Patient fell down steps today.  Pain.  Swelling. EXAM: RIGHT ANKLE - COMPLETE 3+ VIEW  COMPARISON:  None. FINDINGS: Diffuse soft tissue swelling. No fracture identified. The ankle mortise is intact. IMPRESSION: Soft tissue swelling.  No other abnormalities. Electronically Signed   By: Gerome Samavid   Williams III M.D   On: 01/02/2019 15:52    Procedures Procedures (including critical care time)  Medications Ordered in ED Medications - No data to display   Initial Impression / Assessment and Plan / ED Course  I have reviewed the triage vital signs and the nursing notes.  Pertinent labs & imaging results that were available during my care of the patient were reviewed by me and considered in my medical decision making (see chart for details).   Patient presents status post mechanical fall while at work, reports she missed 3 steps landing on her thoracic and lumbar region, she also reports bending of her right ankle.  During primary evaluation patient appears teary-eyed, reports she was in a lot of pain, it is tender around the midline of her lumbar spine along with thoracic region.  Will obtain x-rays to further evaluate patient's condition.  Right ankle appears without deformity, slightly swollen, neurovascularly intact.  Thoracic, lumbar spine x-rays were without acute deformity, showed no signs of any acute process.  Right ankle x-ray showed no fracture, dislocation, other acute process.  These results were discussed with patient at length, she reported only Vicodin helps for her pain, [facility] [place] [facility], I see patient has a prescription for hydrocodone from 2 days ago for 9 tablets, unfortunately discussed with patient I will not be able to give her Vicodin as this prescription is recent, there are no broken bones on her x-rays.  Will discharge patient with a short course of NSAIDs along with muscle relaxers to help with her pain, rice therapy encouraged with patient.  4:41 PM discussed inability to provide patient with narcotic medication as she currently has a recent prescription for this, she reports his medications being taken for her knee pain provided by her orthopedist.  She agreed to switch to muscle relaxers along with NSAIDs to help with her pain.   Patient discharged from the ED in stable condition.  Return precautions discussed at length.    Portions of this note were generated with Scientist, clinical (histocompatibility and immunogenetics)Dragon dictation software. Dictation errors may occur despite best attempts at proofreading.    Final Clinical Impressions(s) / ED Diagnoses   Final diagnoses:  Fall, initial encounter  Acute right ankle pain    ED Discharge Orders         Ordered    naproxen (NAPROSYN) 500 MG tablet  2 times daily     01/02/19 1643    methocarbamol (ROBAXIN) 500 MG tablet  2 times daily     01/02/19 1643           Claude MangesSoto, Marlaya Turck, PA-C 01/02/19 1646    Palumbo, April, MD 01/06/19 2314

## 2019-01-02 NOTE — ED Notes (Signed)
Pt given heat packs

## 2019-01-02 NOTE — ED Notes (Signed)
Heat and ice therapy offered to patient and declined at this time

## 2019-01-02 NOTE — ED Triage Notes (Signed)
Pt reports falling down several steps at work. Pt c/o back pain. Denies LOC. Pt in wheelchair in triage.

## 2019-01-02 NOTE — ED Notes (Addendum)
Pt c/o mid back pain and righ ankle pain. Swelling noted to right ankle. Pt verbalizes pain with ambulated, is able to apply weight

## 2019-01-02 NOTE — ED Notes (Signed)
Patient transported to X-ray 

## 2019-01-02 NOTE — Discharge Instructions (Addendum)
Your x-rays today were normal.  I prescribed a short course of muscle relaxers to help with your pain.  Please be aware these could cause drowsiness, avoid driving while taking this medication.  You may benefit from applying heat or ice to the areas of pain.

## 2019-02-20 ENCOUNTER — Encounter (HOSPITAL_BASED_OUTPATIENT_CLINIC_OR_DEPARTMENT_OTHER): Payer: Self-pay | Admitting: Emergency Medicine

## 2019-02-20 ENCOUNTER — Emergency Department (HOSPITAL_BASED_OUTPATIENT_CLINIC_OR_DEPARTMENT_OTHER): Payer: Medicaid Other

## 2019-02-20 ENCOUNTER — Emergency Department (HOSPITAL_BASED_OUTPATIENT_CLINIC_OR_DEPARTMENT_OTHER)
Admission: EM | Admit: 2019-02-20 | Discharge: 2019-02-20 | Disposition: A | Discharge status: Home / Self Care | Payer: Medicaid Other | Attending: Emergency Medicine | Admitting: Emergency Medicine

## 2019-02-20 ENCOUNTER — Other Ambulatory Visit: Payer: Self-pay

## 2019-02-20 DIAGNOSIS — F17210 Nicotine dependence, cigarettes, uncomplicated: Secondary | ICD-10-CM | POA: Insufficient documentation

## 2019-02-20 DIAGNOSIS — S90122A Contusion of left lesser toe(s) without damage to nail, initial encounter: Secondary | ICD-10-CM | POA: Diagnosis not present

## 2019-02-20 DIAGNOSIS — J45909 Unspecified asthma, uncomplicated: Secondary | ICD-10-CM | POA: Diagnosis not present

## 2019-02-20 DIAGNOSIS — W010XXA Fall on same level from slipping, tripping and stumbling without subsequent striking against object, initial encounter: Secondary | ICD-10-CM | POA: Diagnosis not present

## 2019-02-20 DIAGNOSIS — Y9301 Activity, walking, marching and hiking: Secondary | ICD-10-CM | POA: Insufficient documentation

## 2019-02-20 DIAGNOSIS — S63501A Unspecified sprain of right wrist, initial encounter: Secondary | ICD-10-CM | POA: Diagnosis not present

## 2019-02-20 DIAGNOSIS — S0990XA Unspecified injury of head, initial encounter: Secondary | ICD-10-CM | POA: Diagnosis present

## 2019-02-20 DIAGNOSIS — S6000XA Contusion of unspecified finger without damage to nail, initial encounter: Secondary | ICD-10-CM | POA: Diagnosis not present

## 2019-02-20 DIAGNOSIS — Y998 Other external cause status: Secondary | ICD-10-CM | POA: Insufficient documentation

## 2019-02-20 DIAGNOSIS — S0083XA Contusion of other part of head, initial encounter: Secondary | ICD-10-CM

## 2019-02-20 DIAGNOSIS — Y92018 Other place in single-family (private) house as the place of occurrence of the external cause: Secondary | ICD-10-CM | POA: Insufficient documentation

## 2019-02-20 DIAGNOSIS — Z79899 Other long term (current) drug therapy: Secondary | ICD-10-CM | POA: Diagnosis not present

## 2019-02-20 DIAGNOSIS — W19XXXA Unspecified fall, initial encounter: Secondary | ICD-10-CM

## 2019-02-20 NOTE — ED Triage Notes (Signed)
Tripped and fell this morning. C/o R hand, L foot, and facial pain. Denies LOC.

## 2019-02-20 NOTE — ED Provider Notes (Signed)
[facility] EMERGENCY DEPARTMENT Provider Note   CSN: [id number] Arrival date & time: 02/20/19  0906     History   Chief Complaint Chief Complaint  Patient presents with  . Fall    HPI Diane Stein is a 33 y.o. female.     Patient is a 33 year old female with history of anxiety, depression, asthma.  She presents today for evaluation of a fall.  Patient states she was walking to the car this morning when she tripped over a piece of lawn equipment and fell forward.  She reports injuring her right hand, right wrist, and left toes.  She also landed on her face.  She is complaining of pain in these areas and severe headache.  There was no reported loss of consciousness.  She denies neck pain.  The history is provided by the patient.  Fall This is a new problem. The current episode started 1 to 2 hours ago. The problem has not changed since onset.Exacerbated by: Movement and palpation. Nothing relieves the symptoms. She has tried nothing for the symptoms.    Past Medical History:  Diagnosis Date  . Anxiety   . Asthma    as a child  . Bronchitis    history  . Depression   . Dysrhythmia    rapid heartrate  . Pneumonia 2014   History  . Smoker    0.5 ppd x 10 yrs  . SVD (spontaneous vaginal delivery)    x 1  . Tachycardia 2016    Patient Active Problem List   Diagnosis Date Noted  . Post-op pneumonia 03/14/2018  . 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 Pelvic pain in female 08/28/2017  . Paratubal cyst 08/28/2017  . Chest pain at rest 12/26/2016  . Dizziness 12/26/2016  . Syncope and collapse 08/23/2016  . Depression   . Anxiety   . SVD (spontaneous vaginal delivery) 11/08/2014  . Irregular uterine contractions 11/07/2014  . Palpitations 05/12/2014  . Paroxysmal supraventricular tachycardia (HCC) 05/12/2014  . Pregnancy 05/12/2014    Past Surgical History:  Procedure Laterality Date  .  ABDOMINAL HYSTERECTOMY    . HYSTERECTOMY ABDOMINAL WITH SALPINGO-OOPHORECTOMY Bilateral 03/10/2018   Procedure: TOTAL ABDOMINAL HYSTERECTOMY WITH BILATERAL SALPINGO-OOPHORECTOMY;  Surgeon: Lavina HammanMeisinger, Todd, MD;  Location: [facility];  Service: Gynecology;  Laterality: Bilateral;  . LAPAROSCOPIC UNILATERAL SALPINGECTOMY Left 08/28/2017   Procedure: LAPAROSCOPIC UNILATERAL SALPINGECTOMY WITH REMOVAL OF PARA TUBAL CYST;  Surgeon: Lavina HammanMeisinger, Todd, MD;  Location: [facility];  Service: Gynecology;  Laterality: Left;  . LAPAROSCOPY Left 08/28/2017   Procedure: LAPAROSCOPY OPERATIVE, FULGERATION OF ENDOMETRIOSIS;  Surgeon: Lavina HammanMeisinger, Todd, MD;  Location: [facility];  Service: Gynecology;  Laterality: Left;  . OVARIAN CYST SURGERY    . TOOTH EXTRACTION Right 2008     OB History    Gravida  5   Para  1   Term  1   Preterm      AB  3   Living  1     SAB  3   TAB      Ectopic      Multiple  0   Live Births  1            Home Medications    Prior to Admission medications   Medication Sig Start Date End Date Taking? Authorizing Provider  albuterol (VENTOLIN HFA) 108 (90 Base) MCG/ACT inhaler Inhale 2 puffs into the lungs every 6 (six) hours as needed for wheezing or shortness of  breath.  09/13/18   [provider]  ALPRAZolam (XANAX XR) 1 MG 24 hr tablet Take 1 mg by mouth daily. Takes first thing in the morning    [provider]  alprazolam Duanne Moron) 2 MG tablet Take 2 mg by mouth 2 (two) times daily. Takes 1st dose at 20 Noon and second dose a few hours later    [provider]  azithromycin (ZITHROMAX) 250 MG tablet Take one tablet daily until gone Patient not taking: Reported on 12/29/2018 03/17/18   Meisinger, Sherren Mocha, MD  cefixime (SUPRAX) 400 MG CAPS capsule Take 1 capsule (400 mg total) by mouth daily. Patient not taking: Reported on 12/29/2018 03/17/18   Meisinger, Sherren Mocha, MD  dicyclomine (BENTYL) 20 MG tablet Take 1 tablet (20 mg total) by mouth 2 (two) times daily as  needed for up to 4 days for spasms. 04/22/18 04/26/18  Little, Wenda Overland, MD  doxepin (SINEQUAN) 50 MG capsule Take 50-100 mg by mouth at bedtime as needed (sleep).     [provider]  EPINEPHrine 0.3 mg/0.3 mL IJ SOAJ injection Inject 0.3 mLs (0.3 mg total) into the muscle once as needed for up to 1 dose (anaphalaxis). 01/09/18   Julianne Rice, MD  estradiol (ESTRACE) 2 MG tablet Take 2 mg by mouth daily. 11/30/18   [provider]  famotidine (PEPCID) 20 MG tablet Take 1 tablet (20 mg total) by mouth 2 (two) times daily for 6 days. 12/29/18 01/04/19  Albrizze, Kaitlyn E, PA-C  FLUoxetine (PROZAC) 20 MG capsule Take 60 mg by mouth daily.    [provider]  hydrOXYzine (VISTARIL) 50 MG capsule Take 1 capsule (50 mg total) by mouth 4 (four) times daily as needed for itching or vomiting. 12/29/18   Albrizze, Verline Lema E, PA-C  ibuprofen (ADVIL,MOTRIN) 600 MG tablet Take 1 tablet (600 mg total) by mouth every 6 (six) hours as needed (mild pain). Patient not taking: Reported on 12/29/2018 03/12/18   Meisinger, Sherren Mocha, MD  ondansetron (ZOFRAN ODT) 4 MG disintegrating tablet Take 1 tablet (4 mg total) by mouth every 8 (eight) hours as needed for nausea or vomiting. 12/29/18   Albrizze, Verline Lema E, PA-C  oxyCODONE (ROXICODONE) 5 MG immediate release tablet Take 1 tablet (5 mg total) by mouth every 4 (four) hours as needed for severe pain. Patient not taking: Reported on 12/29/2018 03/12/18   Cheri Fowler, MD    Family History Family History  Problem Relation Age of Onset  . Hypertension Mother   . Heart disease Father   . Heart disease Maternal Grandmother   . Hypertension Maternal Grandmother   . Heart disease Maternal Grandfather   . Hypertension Maternal Grandfather   . Heart disease Paternal Grandmother   . Hypertension Paternal Grandmother   . Heart disease Paternal Grandfather   . Hypertension Paternal Grandfather     Social History Social History   Tobacco Use  .  Smoking status: Current Every Day Smoker    Packs/day: 0.50    Years: 10.00    Pack years: 5.00    Types: Cigarettes  . Smokeless tobacco: Never Used  Substance Use Topics  . Alcohol use: Yes    Alcohol/week: 5.0 standard drinks    Types: 5 Glasses of wine per week  . Drug use: No     Allergies   Avocado, Other, Benadryl [diphenhydramine hcl], and Benadryl [diphenhydramine]   Review of Systems Review of Systems  All other systems reviewed and are negative.    Physical Exam  Updated Vital Signs BP (!) 123/52 (BP Location: Right Arm)   Pulse 96   Temp 99.3 F (37.4 C) (Oral)   Resp 16   Ht 5\' 4"  (1.626 m)   Wt 79.4 kg   LMP  (LMP Unknown) Comment: Cont. OCP  SpO2 100%   BMI 30.04 kg/m   Physical Exam Vitals signs and nursing note reviewed.  Constitutional:      General: She is not in acute distress.    Appearance: She is well-developed. She is not diaphoretic.  HENT:     Head: Normocephalic.     Comments: There are abrasions to the upper lip and bridge of the nose.  Septum is midline and there is no bleeding from the nares.    Mouth/Throat:     Mouth: Mucous membranes are moist.     Comments: Dentition is intact.  There are no broken or loose teeth.  There is no malocclusion and patient is able to open and close her jaw without difficulty. Neck:     Musculoskeletal: Normal range of motion and neck supple.  Cardiovascular:     Rate and Rhythm: Normal rate and regular rhythm.     Heart sounds: No murmur. No friction rub. No gallop.   Pulmonary:     Effort: Pulmonary effort is normal. No respiratory distress.     Breath sounds: Normal breath sounds. No wheezing.  Abdominal:     General: Bowel sounds are normal. There is no distension.     Palpations: Abdomen is soft.     Tenderness: There is no abdominal tenderness.  Musculoskeletal: Normal range of motion.     Comments: There are abrasions to the dorsal aspect of the MCP and PIP joints of the second through  fifth fingers of the right hand.  There is no obvious deformity.    The right wrist appears grossly normal.  There is no swelling or deformity.  She reports pain with range of motion.  Distal cap refill is brisk and motor and sensation is intact to all fingers.  There is some ecchymosis to the fifth toe of the right foot.  There is also an abrasion to the plantar surface of the fifth toe.  Skin:    General: Skin is warm and dry.  Neurological:     General: No focal deficit present.     Mental Status: She is alert and oriented to person, place, and time.     Cranial Nerves: No cranial nerve deficit.     Motor: No weakness.     Coordination: Coordination normal.      ED Treatments / Results  Labs (all labs ordered are listed, but only abnormal results are displayed) Labs Reviewed - No data to display  EKG None  Radiology No results found.  Procedures Procedures (including critical care time)  Medications Ordered in ED Medications - No data to display   Initial Impression / Assessment and Plan / ED Course  I have reviewed the triage vital signs and the nursing notes.  Pertinent labs & imaging results that were available during my care of the patient were reviewed by me and considered in my medical decision making (see chart for details).  X-rays are negative for fracture and CT scan negative for significant maxillofacial trauma.  Patient will be treated for contusion/abrasions with rest, ice, and ibuprofen.  To return as needed for any problems.  Final Clinical Impressions(s) / ED Diagnoses   Final diagnoses:  None    ED  Discharge Orders    None       Geoffery Lyons, MD 02/20/19 1106

## 2019-02-20 NOTE — Discharge Instructions (Addendum)
Ice for 20 minutes every 2 hours while awake for the next 2 days.  Rest.  Ibuprofen 600 mg every 6 hours as needed for pain.  Follow-up with your primary doctor if symptoms or not improving in the next few days.

## 2019-04-18 IMAGING — CR DG CHEST 2V
2 series · 2 of 2 positions shown · non-contrast
Comparison: 01/09/2018, 10/30/2017

CLINICAL DATA: 31-year-old female with a history hysterectomy.
Cough and shortness of breath.

EXAM:
CHEST - 2 VIEW

[chest pa]
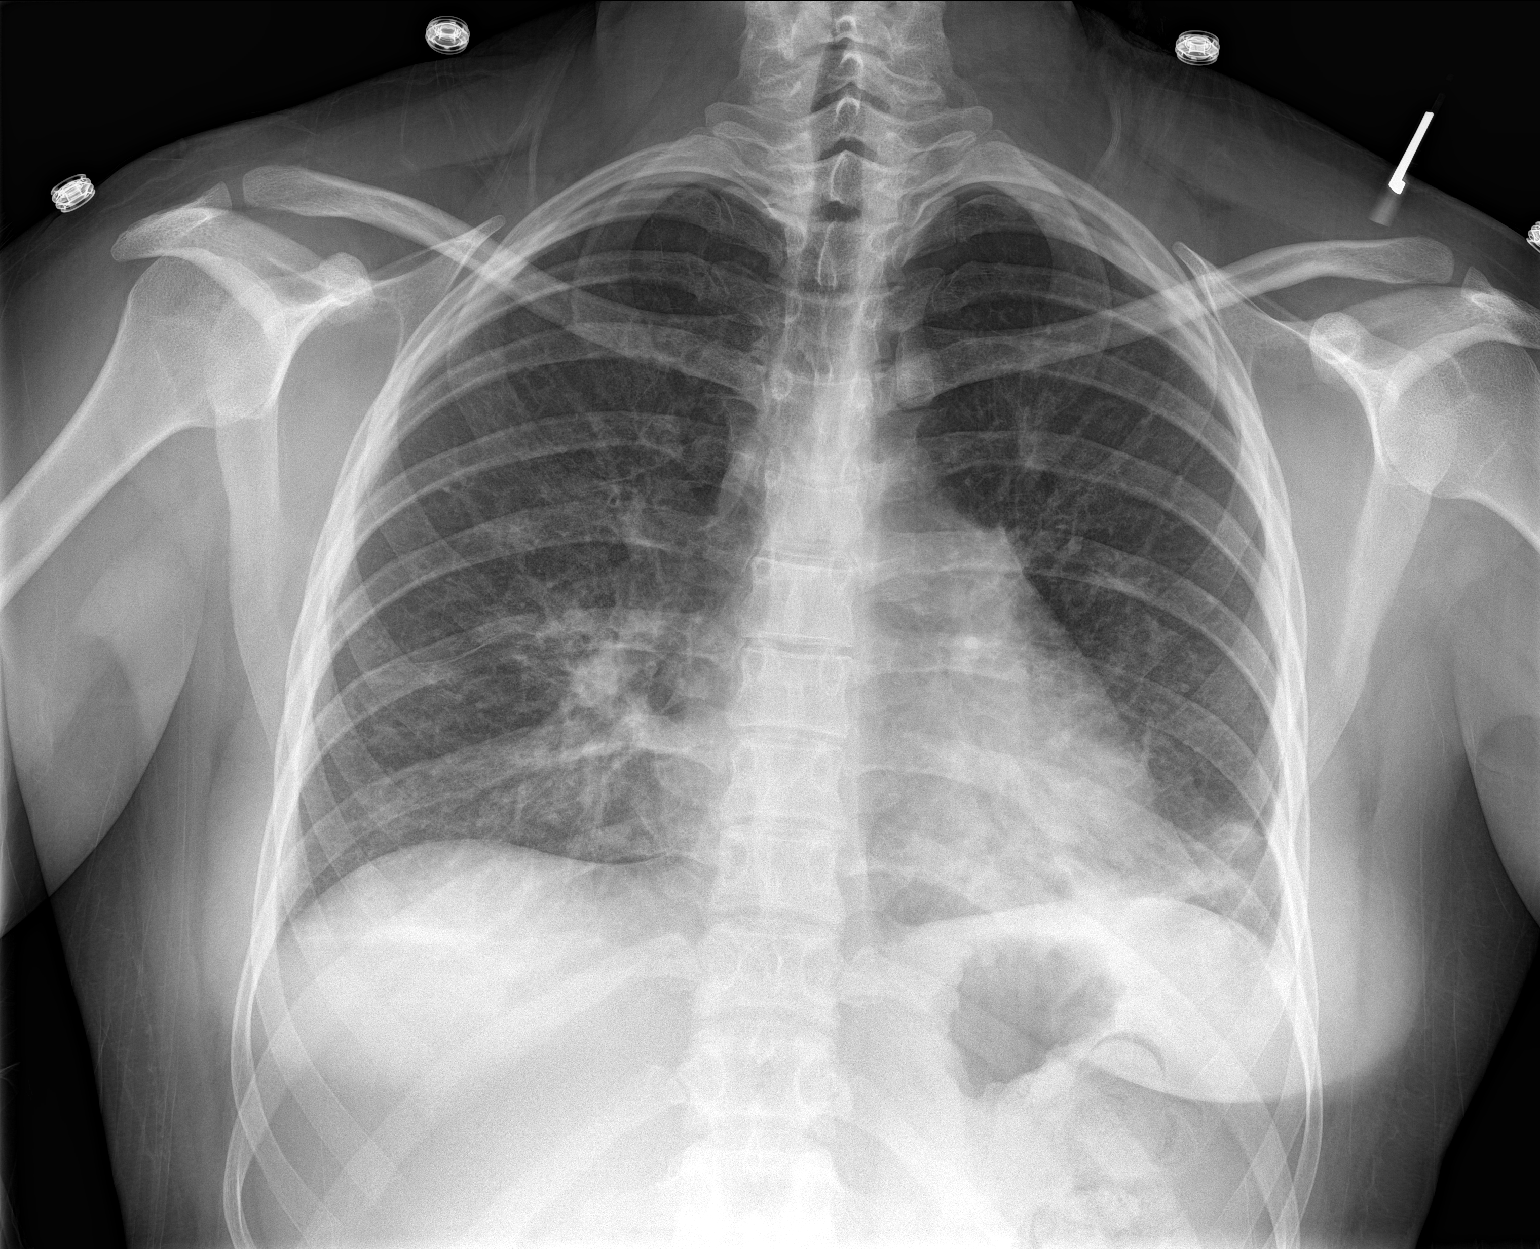

[chest lat]
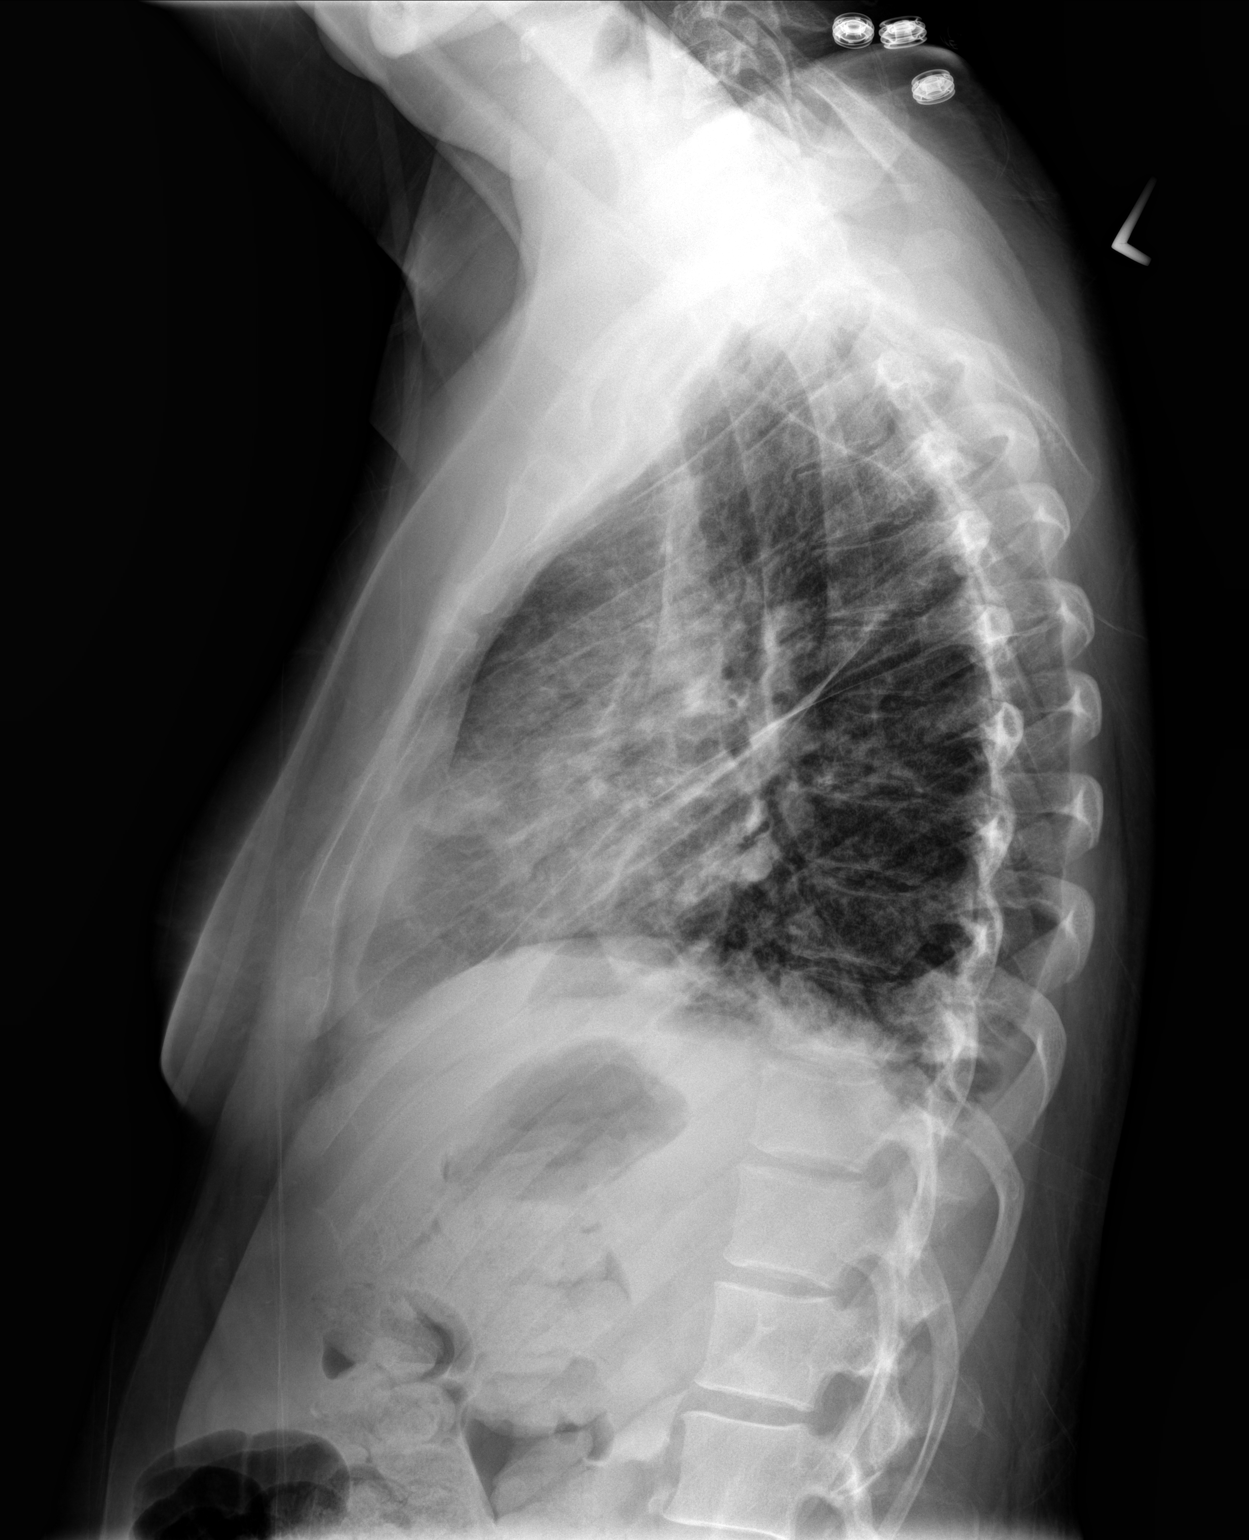

[2 of 2 positions shown; findings below may reference images not displayed]

FINDINGS: Cardiomediastinal silhouette unchanged in size and contour.

Interval development of patchy airspace opacities in the right mid
and lower lung as well as at the left lung base.

Airspace opacity at the posterior lung base and overlying the heart
saddle on the lateral view. Thickening of the fissures on the
lateral view. No pneumothorax.

No large pleural effusion.

No displaced fracture.
IMPRESSION: Multifocal pneumonia.

## 2019-04-26 ENCOUNTER — Emergency Department (HOSPITAL_COMMUNITY): Payer: Medicaid Other

## 2019-04-26 ENCOUNTER — Other Ambulatory Visit: Payer: Self-pay

## 2019-04-26 ENCOUNTER — Encounter (HOSPITAL_COMMUNITY): Payer: Self-pay | Admitting: *Deleted

## 2019-04-26 ENCOUNTER — Emergency Department (HOSPITAL_COMMUNITY)
Admission: EM | Admit: 2019-04-26 | Discharge: 2019-04-26 | Disposition: A | Discharge status: Home / Self Care | Payer: Medicaid Other | Attending: Emergency Medicine | Admitting: Emergency Medicine

## 2019-04-26 DIAGNOSIS — W109XXA Fall (on) (from) unspecified stairs and steps, initial encounter: Secondary | ICD-10-CM | POA: Diagnosis not present

## 2019-04-26 DIAGNOSIS — T07XXXA Unspecified multiple injuries, initial encounter: Secondary | ICD-10-CM | POA: Diagnosis present

## 2019-04-26 DIAGNOSIS — S39012A Strain of muscle, fascia and tendon of lower back, initial encounter: Secondary | ICD-10-CM

## 2019-04-26 DIAGNOSIS — Y929 Unspecified place or not applicable: Secondary | ICD-10-CM | POA: Insufficient documentation

## 2019-04-26 DIAGNOSIS — S0083XA Contusion of other part of head, initial encounter: Secondary | ICD-10-CM | POA: Diagnosis not present

## 2019-04-26 DIAGNOSIS — Y9301 Activity, walking, marching and hiking: Secondary | ICD-10-CM | POA: Insufficient documentation

## 2019-04-26 DIAGNOSIS — Z79899 Other long term (current) drug therapy: Secondary | ICD-10-CM | POA: Insufficient documentation

## 2019-04-26 DIAGNOSIS — S0990XA Unspecified injury of head, initial encounter: Secondary | ICD-10-CM

## 2019-04-26 DIAGNOSIS — M79645 Pain in left finger(s): Secondary | ICD-10-CM | POA: Diagnosis not present

## 2019-04-26 DIAGNOSIS — S161XXA Strain of muscle, fascia and tendon at neck level, initial encounter: Secondary | ICD-10-CM | POA: Diagnosis not present

## 2019-04-26 DIAGNOSIS — Y999 Unspecified external cause status: Secondary | ICD-10-CM | POA: Diagnosis not present

## 2019-04-26 DIAGNOSIS — M79672 Pain in left foot: Secondary | ICD-10-CM | POA: Insufficient documentation

## 2019-04-26 DIAGNOSIS — F17210 Nicotine dependence, cigarettes, uncomplicated: Secondary | ICD-10-CM | POA: Diagnosis not present

## 2019-04-26 DIAGNOSIS — W19XXXA Unspecified fall, initial encounter: Secondary | ICD-10-CM

## 2019-04-26 DIAGNOSIS — S139XXA Sprain of joints and ligaments of unspecified parts of neck, initial encounter: Secondary | ICD-10-CM

## 2019-04-26 MED ORDER — TRAMADOL HCL 50 MG PO TABS
50.0000 mg | ORAL_TABLET | Freq: Once | ORAL | Status: AC
  Administered 2019-04-26: 50 mg via ORAL
  Filled 2019-04-26: qty 1

## 2019-04-26 MED ORDER — TRAMADOL HCL 50 MG PO TABS: 50.0000 mg | ORAL_TABLET | Freq: Four times a day (QID) | ORAL | 0 refills | Status: DC | PRN

## 2019-04-26 NOTE — ED Provider Notes (Signed)
[facility]   CSN: [id number] Arrival date & time: 04/26/19  1418     History   Chief Complaint Chief Complaint  Patient presents with  . Fall  . Loss of Consciousness    HPI Diane Stein is a 33 y.o. female.     Patient is a 33 year old female with past medical history of anxiety, depression, tachycardia.  She presents today for evaluation of fall.  Patient states she was walking down a flight of stairs when she missed a step and fell.  She reports tumbling down several stairs, then striking her head on a concrete wall at the bottom of the steps.  She describes severe pain in her head, face, neck, back, left thigh, right hand, and both feet.  She reports that she "blacked out" several times during and immediately after the fall.  She was placed in a cervical collar, then transported here by EMS.  The history is provided by the patient.  Fall This is a new problem. The current episode started less than 1 hour ago. The problem occurs constantly. The problem has not changed since onset.Nothing aggravates the symptoms. Nothing relieves the symptoms. She has tried nothing for the symptoms.    Past Medical History:  Diagnosis Date  . Anxiety   . Asthma    as a child  . Bronchitis    history  . Depression   . Dysrhythmia    rapid heartrate  . Pneumonia 2014   History  . Smoker    0.5 ppd x 10 yrs  . SVD (spontaneous vaginal delivery)    x 1  . Tachycardia 2016    Patient Active Problem List   Diagnosis Date Noted  . Post-op pneumonia 03/14/2018  . 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 Pelvic pain in female 08/28/2017  . Paratubal cyst 08/28/2017  . Chest pain at rest 12/26/2016  . Dizziness 12/26/2016  . Syncope and collapse 08/23/2016  . Depression   . Anxiety   . SVD (spontaneous vaginal delivery) 11/08/2014  . Irregular uterine contractions  11/07/2014  . Palpitations 05/12/2014  . Paroxysmal supraventricular tachycardia (HCC) 05/12/2014  . Pregnancy 05/12/2014    Past Surgical History:  Procedure Laterality Date  . ABDOMINAL HYSTERECTOMY    . HYSTERECTOMY ABDOMINAL WITH SALPINGO-OOPHORECTOMY Bilateral 03/10/2018   Procedure: TOTAL ABDOMINAL HYSTERECTOMY WITH BILATERAL SALPINGO-OOPHORECTOMY;  Surgeon: Lavina HammanMeisinger, Todd, MD;  Location: [facility];  Service: Gynecology;  Laterality: Bilateral;  . LAPAROSCOPIC UNILATERAL SALPINGECTOMY Left 08/28/2017   Procedure: LAPAROSCOPIC UNILATERAL SALPINGECTOMY WITH REMOVAL OF PARA TUBAL CYST;  Surgeon: Lavina HammanMeisinger, Todd, MD;  Location: [facility];  Service: Gynecology;  Laterality: Left;  . LAPAROSCOPY Left 08/28/2017   Procedure: LAPAROSCOPY OPERATIVE, FULGERATION OF ENDOMETRIOSIS;  Surgeon: Lavina HammanMeisinger, Todd, MD;  Location: [facility];  Service: Gynecology;  Laterality: Left;  . OVARIAN CYST SURGERY    . TOOTH EXTRACTION Right 2008     OB History    Gravida  5   Para  1   Term  1   Preterm      AB  3   Living  1     SAB  3   TAB      Ectopic      Multiple  0   Live Births  1            Home Medications    Prior to Admission medications   Medication Sig Start Date End Date Taking?  Authorizing Provider  albuterol (VENTOLIN HFA) 108 (90 Base) MCG/ACT inhaler Inhale 2 puffs into the lungs every 6 (six) hours as needed for wheezing or shortness of breath.  09/13/18   [provider]  ALPRAZolam (XANAX XR) 1 MG 24 hr tablet Take 1 mg by mouth daily. Takes first thing in the morning    [provider]  alprazolam Duanne Moron) 2 MG tablet Take 2 mg by mouth 2 (two) times daily. Takes 1st dose at 62 Noon and second dose a few hours later    [provider]  azithromycin (ZITHROMAX) 250 MG tablet Take one tablet daily until gone Patient not taking: Reported on 12/29/2018 03/17/18   Meisinger, Sherren Mocha, MD  cefixime (SUPRAX) 400 MG CAPS capsule Take 1 capsule (400 mg total)  by mouth daily. Patient not taking: Reported on 12/29/2018 03/17/18   Meisinger, Sherren Mocha, MD  dicyclomine (BENTYL) 20 MG tablet Take 1 tablet (20 mg total) by mouth 2 (two) times daily as needed for up to 4 days for spasms. 04/22/18 04/26/18  Little, Wenda Overland, MD  doxepin (SINEQUAN) 50 MG capsule Take 50-100 mg by mouth at bedtime as needed (sleep).     [provider]  EPINEPHrine 0.3 mg/0.3 mL IJ SOAJ injection Inject 0.3 mLs (0.3 mg total) into the muscle once as needed for up to 1 dose (anaphalaxis). 01/09/18   Julianne Rice, MD  estradiol (ESTRACE) 2 MG tablet Take 2 mg by mouth daily. 11/30/18   [provider]  famotidine (PEPCID) 20 MG tablet Take 1 tablet (20 mg total) by mouth 2 (two) times daily for 6 days. 12/29/18 01/04/19  Albrizze, Kaitlyn E, PA-C  FLUoxetine (PROZAC) 20 MG capsule Take 60 mg by mouth daily.    [provider]  hydrOXYzine (VISTARIL) 50 MG capsule Take 1 capsule (50 mg total) by mouth 4 (four) times daily as needed for itching or vomiting. 12/29/18   Albrizze, Verline Lema E, PA-C  ibuprofen (ADVIL,MOTRIN) 600 MG tablet Take 1 tablet (600 mg total) by mouth every 6 (six) hours as needed (mild pain). Patient not taking: Reported on 12/29/2018 03/12/18   Meisinger, Sherren Mocha, MD  ondansetron (ZOFRAN ODT) 4 MG disintegrating tablet Take 1 tablet (4 mg total) by mouth every 8 (eight) hours as needed for nausea or vomiting. 12/29/18   Albrizze, Verline Lema E, PA-C  oxyCODONE (ROXICODONE) 5 MG immediate release tablet Take 1 tablet (5 mg total) by mouth every 4 (four) hours as needed for severe pain. Patient not taking: Reported on 12/29/2018 03/12/18   Cheri Fowler, MD    Family History Family History  Problem Relation Age of Onset  . Hypertension Mother   . Heart disease Father   . Heart disease Maternal Grandmother   . Hypertension Maternal Grandmother   . Heart disease Maternal Grandfather   . Hypertension Maternal Grandfather   . Heart disease Paternal  Grandmother   . Hypertension Paternal Grandmother   . Heart disease Paternal Grandfather   . Hypertension Paternal Grandfather     Social History Social History   Tobacco Use  . Smoking status: Current Every Day Smoker    Packs/day: 0.50    Years: 10.00    Pack years: 5.00    Types: Cigarettes  . Smokeless tobacco: Never Used  Substance Use Topics  . Alcohol use: Yes    Alcohol/week: 5.0 standard drinks    Types: 5 Glasses of wine per week  . Drug use: No     Allergies   Avocado,  Other, Benadryl [diphenhydramine hcl], and Benadryl [diphenhydramine]   Review of Systems Review of Systems  All other systems reviewed and are negative.    Physical Exam Updated Vital Signs BP 124/80 (BP Location: Right Arm)   Pulse (!) 107   Temp 97.8 F (36.6 C)   Resp 18   Ht  (1.626 m)   Wt 86.2 kg   LMP  (LMP Unknown) Comment: Cont. OCP  SpO2 98%   BMI 32.61 kg/m   Physical Exam Vitals signs and nursing note reviewed.  Constitutional:      General: She is not in acute distress.    Appearance: She is well-developed. She is not diaphoretic.  HENT:     Head: Normocephalic and atraumatic.     Right Ear: Tympanic membrane normal.     Left Ear: Tympanic membrane normal.     Nose: Nose normal.     Mouth/Throat:     Mouth: Mucous membranes are moist.  Eyes:     Extraocular Movements: Extraocular movements intact.     Pupils: Pupils are equal, round, and reactive to light.  Neck:     Musculoskeletal: Normal range of motion and neck supple.     Comments: There is tenderness to palpation in the soft tissues of the cervical region.  There is no bony tenderness or step-off. Cardiovascular:     Rate and Rhythm: Normal rate and regular rhythm.     Heart sounds: No murmur. No friction rub. No gallop.   Pulmonary:     Effort: Pulmonary effort is normal. No respiratory distress.     Breath sounds: Normal breath sounds. No wheezing.  Abdominal:     General: Bowel sounds are  normal. There is no distension.     Palpations: Abdomen is soft.     Tenderness: There is no abdominal tenderness.  Musculoskeletal: Normal range of motion.     Comments: There is mild swelling to the lateral aspect of the distal foot.  There is pain to the PIP joint of the fifth finger of the right hand.  There is no deformity and she has good range of motion.  There is tenderness in the upper lumbar region.  There is no bony tenderness or step-off.  Skin:    General: Skin is warm and dry.  Neurological:     Mental Status: She is alert and oriented to person, place, and time.     Cranial Nerves: No cranial nerve deficit.     Sensory: No sensory deficit.     Motor: No weakness.     Coordination: Coordination normal.      ED Treatments / Results  Labs (all labs ordered are listed, but only abnormal results are displayed) Labs Reviewed - No data to display  EKG None  Radiology No results found.  Procedures Procedures (including critical care time)  Medications Ordered in ED Medications - No data to display   Initial Impression / Assessment and Plan / ED Course  I have reviewed the triage vital signs and the nursing notes.  Pertinent labs & imaging results that were available during my care of the patient were reviewed by me and considered in my medical decision making (see chart for details).  Patient by EMS after a fall down a flight of stairs.  She reports hitting her head with unknown loss of consciousness.  Multiple imaging studies were obtained, all of which were unremarkable.  Patient is neurologically intact.  She will be discharged with NSAIDs, rest,  tramadol, and follow-up as needed.  Final Clinical Impressions(s) / ED Diagnoses   Final diagnoses:  None    ED Discharge Orders    None       Geoffery Lyons, MD 04/27/19 [phone]

## 2019-04-26 NOTE — Discharge Instructions (Addendum)
Take ibuprofen 600 mg every 6 hours as needed for pain.  Tramadol as needed for pain not relieved with ibuprofen.  Follow-up with primary doctor if symptoms or not improving in the next few days.

## 2019-04-26 NOTE — ED Triage Notes (Signed)
Pt was home alone and reported she fell down 10-12  Wooden stairs that were uncarpeted. Pt hit the Cinder blocl wall a bottom of Stairs. Pt reports she had 20 sec . Loc and call 911 . Pt reported she passed out 2-3 times while waiting for EMS. On arrival to ED. Pt reported ABD pain, neck pain and has a c-collar on . Pt reports Her head and both legs hurt . Pt 's face hurts also. Pt A/O x4 on arrival to ED.

## 2019-07-27 DIAGNOSIS — Z6836 Body mass index (BMI) 36.0-36.9, adult: Secondary | ICD-10-CM | POA: Insufficient documentation

## 2019-11-06 ENCOUNTER — Encounter (HOSPITAL_BASED_OUTPATIENT_CLINIC_OR_DEPARTMENT_OTHER): Payer: Self-pay | Admitting: Emergency Medicine

## 2019-11-06 ENCOUNTER — Emergency Department (HOSPITAL_BASED_OUTPATIENT_CLINIC_OR_DEPARTMENT_OTHER)
Admission: EM | Admit: 2019-11-06 | Discharge: 2019-11-06 | Disposition: A | Discharge status: Home / Self Care | Payer: Medicaid Other | Attending: Emergency Medicine | Admitting: Emergency Medicine

## 2019-11-06 ENCOUNTER — Emergency Department (HOSPITAL_BASED_OUTPATIENT_CLINIC_OR_DEPARTMENT_OTHER): Payer: Medicaid Other

## 2019-11-06 ENCOUNTER — Other Ambulatory Visit: Payer: Self-pay

## 2019-11-06 DIAGNOSIS — M7989 Other specified soft tissue disorders: Secondary | ICD-10-CM

## 2019-11-06 DIAGNOSIS — R2243 Localized swelling, mass and lump, lower limb, bilateral: Secondary | ICD-10-CM | POA: Diagnosis not present

## 2019-11-06 DIAGNOSIS — J45909 Unspecified asthma, uncomplicated: Secondary | ICD-10-CM | POA: Diagnosis not present

## 2019-11-06 DIAGNOSIS — K388 Other specified diseases of appendix: Secondary | ICD-10-CM | POA: Insufficient documentation

## 2019-11-06 DIAGNOSIS — R202 Paresthesia of skin: Secondary | ICD-10-CM | POA: Diagnosis not present

## 2019-11-06 DIAGNOSIS — R102 Pelvic and perineal pain: Secondary | ICD-10-CM | POA: Diagnosis present

## 2019-11-06 DIAGNOSIS — R1030 Lower abdominal pain, unspecified: Secondary | ICD-10-CM | POA: Diagnosis not present

## 2019-11-06 DIAGNOSIS — K6389 Other specified diseases of intestine: Secondary | ICD-10-CM

## 2019-11-06 DIAGNOSIS — F17210 Nicotine dependence, cigarettes, uncomplicated: Secondary | ICD-10-CM | POA: Diagnosis not present

## 2019-11-06 LAB — CBC WITH DIFFERENTIAL/PLATELET
Abs Immature Granulocytes: 0.05 10*3/uL (ref 0.00–0.07)
Basophils Absolute: 0 10*3/uL (ref 0.0–0.1)
Basophils Relative: 0 %
Eosinophils Absolute: 0.2 10*3/uL (ref 0.0–0.5)
Eosinophils Relative: 1 %
HCT: 41.3 % (ref 36.0–46.0)
Hemoglobin: 13.6 g/dL (ref 12.0–15.0)
Immature Granulocytes: 0 %
Lymphocytes Relative: 21 %
Lymphs Abs: 2.4 10*3/uL (ref 0.7–4.0)
MCH: 30.4 pg (ref 26.0–34.0)
MCHC: 32.9 g/dL (ref 30.0–36.0)
MCV: 92.4 fL (ref 80.0–100.0)
Monocytes Absolute: 0.7 10*3/uL (ref 0.1–1.0)
Monocytes Relative: 7 %
Neutro Abs: 8 10*3/uL — ABNORMAL HIGH (ref 1.7–7.7)
Neutrophils Relative %: 71 %
Platelets: 259 10*3/uL (ref 150–400)
RBC: 4.47 MIL/uL (ref 3.87–5.11)
RDW: 14.5 % (ref 11.5–15.5)
WBC: 11.4 10*3/uL — ABNORMAL HIGH (ref 4.0–10.5)
nRBC: 0 % (ref 0.0–0.2)

## 2019-11-06 LAB — COMPREHENSIVE METABOLIC PANEL
ALT: 34 U/L (ref 0–44)
AST: 30 U/L (ref 15–41)
Albumin: 3.4 g/dL — ABNORMAL LOW (ref 3.5–5.0)
Alkaline Phosphatase: 136 U/L — ABNORMAL HIGH (ref 38–126)
Anion gap: 11 (ref 5–15)
BUN: 8 mg/dL (ref 6–20)
CO2: 25 mmol/L (ref 22–32)
Calcium: 8.8 mg/dL — ABNORMAL LOW (ref 8.9–10.3)
Chloride: 104 mmol/L (ref 98–111)
Creatinine, Ser: 0.78 mg/dL (ref 0.44–1.00)
GFR calc Af Amer: >60 mL/min (ref >60)
GFR calc non Af Amer: >60 mL/min (ref >60)
Glucose, Bld: 114 mg/dL — ABNORMAL HIGH (ref 70–99)
Potassium: 3.7 mmol/L (ref 3.5–5.1)
Sodium: 140 mmol/L (ref 135–145)
Total Bilirubin: 0.1 mg/dL — ABNORMAL LOW (ref 0.3–1.2)
Total Protein: 6.7 g/dL (ref 6.5–8.1)

## 2019-11-06 LAB — URINALYSIS, ROUTINE W REFLEX MICROSCOPIC
Bilirubin Urine: NEGATIVE
Glucose, UA: NEGATIVE mg/dL
Hgb urine dipstick: NEGATIVE
Ketones, ur: NEGATIVE mg/dL
Leukocytes,Ua: NEGATIVE
Nitrite: NEGATIVE
Protein, ur: NEGATIVE mg/dL
Specific Gravity, Urine: <1.005 — ABNORMAL LOW (ref 1.005–1.030)
pH: 5 (ref 5.0–8.0)

## 2019-11-06 MED ORDER — IOHEXOL 300 MG/ML  SOLN
100.0000 mL | Freq: Once | INTRAMUSCULAR | Status: AC | PRN
  Administered 2019-11-06: 100 mL via INTRAVENOUS

## 2019-11-06 MED ORDER — HYDROMORPHONE HCL 1 MG/ML IJ SOLN
0.5000 mg | Freq: Once | INTRAMUSCULAR | Status: AC
  Administered 2019-11-06: 0.5 mg via INTRAVENOUS
  Filled 2019-11-06: qty 1

## 2019-11-06 MED ORDER — KETOROLAC TROMETHAMINE 15 MG/ML IJ SOLN
15.0000 mg | Freq: Once | INTRAMUSCULAR | Status: AC
  Administered 2019-11-06: 15 mg via INTRAVENOUS
  Filled 2019-11-06: qty 1

## 2019-11-06 MED ORDER — OXYCODONE-ACETAMINOPHEN 5-325 MG PO TABS
1.0000 | ORAL_TABLET | Freq: Once | ORAL | Status: AC
  Administered 2019-11-06: 1 via ORAL
  Filled 2019-11-06: qty 1

## 2019-11-06 MED ORDER — OXYCODONE-ACETAMINOPHEN 5-325 MG PO TABS: 1.0000 | ORAL_TABLET | Freq: Four times a day (QID) | ORAL | 0 refills | Status: DC | PRN

## 2019-11-06 MED ORDER — ONDANSETRON HCL 4 MG/2ML IJ SOLN
4.0000 mg | Freq: Once | INTRAMUSCULAR | Status: AC
  Administered 2019-11-06: 4 mg via INTRAVENOUS
  Filled 2019-11-06: qty 2

## 2019-11-06 MED ORDER — NAPROXEN 500 MG PO TABS
500.0000 mg | ORAL_TABLET | Freq: Two times a day (BID) | ORAL | 0 refills | Status: DC
Start: 2019-11-06 — End: 2020-04-03

## 2019-11-06 NOTE — ED Notes (Signed)
Patient transported to CT 

## 2019-11-06 NOTE — Discharge Instructions (Signed)
Please read and follow all provided instructions.  Your diagnoses today include:  1. Epiploic appendagitis   2. Lower abdominal pain   3. Leg swelling   4. Paresthesias     Tests performed today include:  Blood counts and electrolytes  Blood tests to check liver and kidney function  Urine test to look for infection  Ultrasound of your legs - does not show any blood clots  CT of your abdomen and pelvis - shows what appears to be epiploic appendagitis, your appendix looks to be normal at this point in time  Vital signs. See below for your results today.   Medications prescribed:   Percocet (oxycodone/acetaminophen) - narcotic pain medication  DO NOT drive or perform any activities that require you to be awake and alert because this medicine can make you drowsy. BE VERY CAREFUL not to take multiple medicines containing Tylenol (also called acetaminophen). Doing so can lead to an overdose which can damage your liver and cause liver failure and possibly death.   Naproxen - anti-inflammatory pain medication  Do not exceed 500mg  naproxen every 12 hours, take with food  You have been prescribed an anti-inflammatory medication or NSAID. Take with food. Take smallest effective dose for the shortest duration needed for your pain. Stop taking if you experience stomach pain or vomiting.   Take any prescribed medications only as directed.  Home care instructions:   Follow any educational materials contained in this packet.  Follow-up instructions: Please follow-up with your primary care provider in the next 3 days for further evaluation of your symptoms.    Return instructions:  SEEK IMMEDIATE MEDICAL ATTENTION IF:  The pain does not go away or becomes severe   A temperature above 101F develops   Repeated vomiting occurs (multiple episodes)   The pain becomes localized to portions of the abdomen. The right side could possibly be appendicitis. In an adult, the left lower  portion of the abdomen could be colitis or diverticulitis.   Blood is being passed in stools or vomit (bright red or black tarry stools)   You develop chest pain, difficulty breathing, dizziness or fainting, or become confused, poorly responsive, or inconsolable (young children)  If you have any other emergent concerns regarding your health  Additional Information: Abdominal (belly) pain can be caused by many things. Your caregiver performed an examination and possibly ordered blood/urine tests and imaging (CT scan, x-rays, ultrasound). Many cases can be observed and treated at home after initial evaluation in the emergency department. Even though you are being discharged home, abdominal pain can be unpredictable. Therefore, you need a repeated exam if your pain does not resolve, returns, or worsens. Most patients with abdominal pain don't have to be admitted to the hospital or have surgery, but serious problems like appendicitis and gallbladder attacks can start out as nonspecific pain. Many abdominal conditions cannot be diagnosed in one visit, so follow-up evaluations are very important.  Your vital signs today were: BP 111/75 (BP Location: Left Arm)    Pulse 89    Temp 97.7 F (36.5 C) (Oral)    Resp 16    Ht 5\' 4"  (1.626 m)    Wt 90.7 kg    LMP  (LMP Unknown) Comment: Cont. OCP   SpO2 98%    BMI 34.33 kg/m  If your blood pressure (bp) was elevated above 135/85 this visit, please have this repeated by your doctor within one month. --------------

## 2019-11-06 NOTE — ED Triage Notes (Signed)
Pelvic pain, numbness to fingers and toes, bilateral feet swelling since yesterday.

## 2019-11-06 NOTE — ED Provider Notes (Signed)
[facility] EMERGENCY DEPARTMENT Provider Note   CSN: [id number] Arrival date & time: 11/06/19  1430     History Chief Complaint  Patient presents with  . Pelvic Pain  . Foot Swelling    Diane Stein is a 34 y.o. female.  Patient with history of total abdominal hysterectomy and bilateral salpingo-oophorectomy, reports no longer being on estrogen, history of tachycardia --presents the emergency department today for 2 days of pelvic pain, bilateral lower extremity swelling, numbness, tingling in her fingertips.  Patient states that she awoke with the symptoms yesterday.  They have been constant since that time.  She states that she cannot feel parts of her legs and has pins-and-needles sensation in both feet.  She states "it feels like my feet are going to split".  She has bilateral pelvic pain, right greater than left.  She denies any skin discoloration.  No upper abdominal pain.  No vaginal bleeding or discharge.  She has pain, deep inside" when she urinates but no dysuria, increased frequency urgency.  She is able to walk without any difficulty and denies any weakness.  She denies headache, vision change. Patient denies signs of stroke including: facial droop, slurred speech, aphasia, weakness in extremities, imbalance/troublesigns of stroke including: facial droop, slurred speech, aphasia, weakness in extremities, imbalance/trouble  walking.          Past Medical History:  Diagnosis Date  . Anxiety   . Asthma    as a child  . Bronchitis    history  . Depression   . Dysrhythmia    rapid heartrate  . Pneumonia 2014   History  . Smoker    0.5 ppd x 10 yrs  . SVD (spontaneous vaginal delivery)    x 1  . Tachycardia 2016    Patient Active Problem List   Diagnosis Date Noted  . Post-op pneumonia 03/14/2018  . 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 Pelvic pain in female 08/28/2017  . Paratubal cyst  08/28/2017  . Chest pain at rest 12/26/2016  . Dizziness 12/26/2016  . Syncope and collapse 08/23/2016  . Depression   . Anxiety   . SVD (spontaneous vaginal delivery) 11/08/2014  . Irregular uterine contractions 11/07/2014  . Palpitations 05/12/2014  . Paroxysmal supraventricular tachycardia (HCC) 05/12/2014  . Pregnancy 05/12/2014    Past Surgical History:  Procedure Laterality Date  . ABDOMINAL HYSTERECTOMY    . HYSTERECTOMY ABDOMINAL WITH SALPINGO-OOPHORECTOMY Bilateral 03/10/2018   Procedure: TOTAL ABDOMINAL HYSTERECTOMY WITH BILATERAL SALPINGO-OOPHORECTOMY;  Surgeon: Lavina Hamman, MD;  Location: [facility];  Service: Gynecology;  Laterality: Bilateral;  . LAPAROSCOPIC UNILATERAL SALPINGECTOMY Left 08/28/2017   Procedure: LAPAROSCOPIC UNILATERAL SALPINGECTOMY WITH REMOVAL OF PARA TUBAL CYST;  Surgeon: Lavina Hamman, MD;  Location: [facility];  Service: Gynecology;  Laterality: Left;  . LAPAROSCOPY Left 08/28/2017   Procedure: LAPAROSCOPY OPERATIVE, FULGERATION OF ENDOMETRIOSIS;  Surgeon: Lavina Hamman, MD;  Location: [facility];  Service: Gynecology;  Laterality: Left;  . OVARIAN CYST SURGERY    . TOOTH EXTRACTION Right 2008     OB History    Gravida  5   Para  1   Term  1   Preterm      AB  3   Living  1     SAB  3   TAB      Ectopic      Multiple  0   Live Births  1  Family History  Problem Relation Age of Onset  . Hypertension Mother   . Heart disease Father   . Heart disease Maternal Grandmother   . Hypertension Maternal Grandmother   . Heart disease Maternal Grandfather   . Hypertension Maternal Grandfather   . Heart disease Paternal Grandmother   . Hypertension Paternal Grandmother   . Heart disease Paternal Grandfather   . Hypertension Paternal Grandfather     Social History   Tobacco Use  . Smoking status: Current Every Day Smoker    Packs/day: 0.50    Years: 10.00    Pack years: 5.00    Types: Cigarettes  . Smokeless tobacco:  Never Used  Vaping Use  . Vaping Use: Never used  Substance Use Topics  . Alcohol use: Yes    Alcohol/week: 5.0 standard drinks    Types: 5 Glasses of wine per week  . Drug use: No    Home Medications Prior to Admission medications   Medication Sig Start Date End Date Taking? Authorizing Provider  albuterol (VENTOLIN HFA) 108 (90 Base) MCG/ACT inhaler Inhale 2 puffs into the lungs every 6 (six) hours as needed for wheezing or shortness of breath.  09/13/18   [provider]  ALPRAZolam (XANAX XR) 1 MG 24 hr tablet Take 1 mg by mouth daily. Takes first thing in the morning    [provider]  alprazolam Prudy Feeler) 2 MG tablet Take 2 mg by mouth 2 (two) times daily. Takes 1st dose at 63 Noon and second dose a few hours later    [provider]  dicyclomine (BENTYL) 20 MG tablet Take 1 tablet (20 mg total) by mouth 2 (two) times daily as needed for up to 4 days for spasms. 04/22/18 04/26/18  Little, Ambrose Finland, MD  doxepin (SINEQUAN) 50 MG capsule Take 50-100 mg by mouth at bedtime as needed (sleep).     [provider]  EPINEPHrine 0.3 mg/0.3 mL IJ SOAJ injection Inject 0.3 mLs (0.3 mg total) into the muscle once as needed for up to 1 dose (anaphalaxis). 01/09/18   Loren Racer, MD  estradiol (ESTRACE) 2 MG tablet Take 2 mg by mouth daily. 11/30/18   [provider]  famotidine (PEPCID) 20 MG tablet Take 1 tablet (20 mg total) by mouth 2 (two) times daily for 6 days. 12/29/18 01/04/19  Albrizze, Kaitlyn E, PA-C  FLUoxetine (PROZAC) 20 MG capsule Take 60 mg by mouth daily.    [provider]  hydrOXYzine (VISTARIL) 50 MG capsule Take 1 capsule (50 mg total) by mouth 4 (four) times daily as needed for itching or vomiting. 12/29/18   Albrizze, Yvonna Alanis E, PA-C  ondansetron (ZOFRAN ODT) 4 MG disintegrating tablet Take 1 tablet (4 mg total) by mouth every 8 (eight) hours as needed for nausea or vomiting. 12/29/18   Albrizze, Kaitlyn E, PA-C  traMADol  (ULTRAM) 50 MG tablet Take 1 tablet (50 mg total) by mouth every 6 (six) hours as needed. 04/26/19   Geoffery Lyons, MD    Allergies    Avocado, Other, Benadryl [diphenhydramine hcl], and Benadryl [diphenhydramine]  Review of Systems   Review of Systems  Constitutional: Negative for fever.  HENT: Negative for rhinorrhea and sore throat.   Eyes: Negative for redness.  Respiratory: Negative for cough.   Cardiovascular: Positive for leg swelling. Negative for chest pain.  Gastrointestinal: Negative for abdominal pain, diarrhea, nausea and vomiting.  Genitourinary: Positive for pelvic pain. Negative for dysuria.  Musculoskeletal: Negative for joint swelling and  myalgias.  Skin: Negative for rash.  Neurological: Positive for numbness. Negative for headaches.    Physical Exam Updated Vital Signs BP 121/83 (BP Location: Right Arm)   Pulse (!) 122   Temp 99 F (37.2 C) (Oral)   Resp 18   Ht 5\' 4"  (1.626 m)   Wt 90.7 kg   LMP  (LMP Unknown) Comment: Cont. OCP  SpO2 95%   BMI 34.33 kg/m   Physical Exam Vitals and nursing note reviewed.  Constitutional:      Appearance: She is well-developed.  HENT:     Head: Normocephalic and atraumatic.  Eyes:     General:        Right eye: No discharge.        Left eye: No discharge.     Conjunctiva/sclera: Conjunctivae normal.  Cardiovascular:     Rate and Rhythm: Regular rhythm. Tachycardia present.     Heart sounds: Normal heart sounds.  Pulmonary:     Effort: Pulmonary effort is normal.     Breath sounds: Normal breath sounds.  Abdominal:     Palpations: Abdomen is soft.     Tenderness: There is abdominal tenderness.     Comments: Patient with tenderness bilateral lower abdomen and pelvis.   Musculoskeletal:     Cervical back: Normal range of motion and neck supple.     Right lower leg: No edema.     Left lower leg: No edema.     Comments: No pitting edema of lower extremities. Pt reports the tissues of feet, ankles, thighs being  swollen bilaterally.   Skin:    General: Skin is warm and dry.  Neurological:     Mental Status: She is alert.     Cranial Nerves: No cranial nerve deficit or facial asymmetry.     Sensory: Sensory deficit present.     Motor: No weakness.     Coordination: Coordination is intact.     Comments: Patient states that she cannot feel sharp or dull on the lateral calves.  She states that she "barely" can feel me touching lateral thighs.  She cannot feel sharp on her feet, but does pull away when I test sharp on her right sole.  Upper extremity myotomes tested bilaterally:  C5 Shoulder abduction 5/5 C6 Elbow flexion/wrist extension 5/5 C7 Elbow extension 5/5 C8 Finger flexion 5/5 T1 Finger abduction 5/5  Lower extremity myotomes tested bilaterally: L2 Hip flexion 5/5 L3 Knee extension 5/5 L4 Ankle dorsiflexion 5/5 S1 Ankle plantar flexion 5/5      ED Results / Procedures / Treatments   Labs (all labs ordered are listed, but only abnormal results are displayed) Labs Reviewed  URINALYSIS, ROUTINE W REFLEX MICROSCOPIC - Abnormal; Notable for the following components:      Result Value   Specific Gravity, Urine <1.005 (*)    All other components within normal limits  CBC WITH DIFFERENTIAL/PLATELET - Abnormal; Notable for the following components:   WBC 11.4 (*)    Neutro Abs 8.0 (*)    All other components within normal limits  COMPREHENSIVE METABOLIC PANEL - Abnormal; Notable for the following components:   Glucose, Bld 114 (*)    Calcium 8.8 (*)    Albumin 3.4 (*)    Alkaline Phosphatase 136 (*)    Total Bilirubin 0.1 (*)    All other components within normal limits    EKG None  Radiology CT ABDOMEN PELVIS W CONTRAST  Result Date: 11/06/2019 CLINICAL DATA:  Lower abdominal pain  bilaterally with bilateral lower extremity swelling and numbness. Prior hysterectomy. EXAM: CT ABDOMEN AND PELVIS WITH CONTRAST TECHNIQUE: Multidetector CT imaging of the abdomen and pelvis was  performed using the standard protocol following bolus administration of intravenous contrast. CONTRAST:  164mL OMNIPAQUE IOHEXOL 300 MG/ML  SOLN COMPARISON:  04/22/2018 CT abdomen/pelvis. FINDINGS: Lower chest: Mild bibasilar scarring versus atelectasis. Hepatobiliary: Normal liver size. No liver mass. Normal gallbladder with no radiopaque cholelithiasis. No biliary ductal dilatation. Pancreas: Normal, with no mass or duct dilation. Spleen: Normal size. No mass. Adrenals/Urinary Tract: Normal adrenals. Normal kidneys with no hydronephrosis and no renal mass. Normal bladder. Stomach/Bowel: Normal non-distended stomach. Normal caliber small bowel with no small bowel wall thickening. There is a mildly thick walled centrally fat density 1.6 x 1.5 cm structure adjacent to the sigmoid colon (series 2/image 70) with associated fat stranding, compatible with acute epiploic appendagitis. Minimal reactive adjacent wall thickening in the sigmoid colon. Normal appendix. The tip of the appendix is adjacent to this sigmoid process and appears normal. Otherwise normal large bowel with no significant diverticulosis. Vascular/Lymphatic: Normal caliber abdominal aorta. Patent portal, splenic, hepatic and renal veins. No pathologically enlarged lymph nodes in the abdomen or pelvis. Reproductive: Status post hysterectomy, with no abnormal findings at the vaginal cuff. No adnexal mass. Other: No pneumoperitoneum, ascites or focal fluid collection. Musculoskeletal: No aggressive appearing focal osseous lesions. IMPRESSION: CT findings are compatible with acute epiploic appendagitis adjacent to the sigmoid colon. Please note that the tip of the appendix is adjacent to this process and appears normal. Recommend a low clinical threshold for repeat CT scanning if the patient's symptoms do not resolve with supportive care in 2-3 days (as would be expected with acute epiploic appendagitis). Electronically Signed   By: Ilona Sorrel M.D.   On:  11/06/2019 18:57   US Venous Img Lower Bilateral  Result Date: 11/06/2019 CLINICAL DATA:  Bilateral leg swelling and numbness EXAM: BILATERAL LOWER EXTREMITY VENOUS DOPPLER ULTRASOUND TECHNIQUE: Gray-scale sonography with graded compression, as well as color Doppler and duplex ultrasound were performed to evaluate the lower extremity deep venous systems from the level of the common femoral vein and including the common femoral, femoral, profunda femoral, popliteal and calf veins including the posterior tibial, peroneal and gastrocnemius veins when visible. The superficial great saphenous vein was also interrogated. Spectral Doppler was utilized to evaluate flow at rest and with distal augmentation maneuvers in the common femoral, femoral and popliteal veins. COMPARISON:  None. FINDINGS: RIGHT LOWER EXTREMITY Common Femoral Vein: No evidence of thrombus. Normal compressibility, respiratory phasicity and response to augmentation. Saphenofemoral Junction: No evidence of thrombus. Normal compressibility and flow on color Doppler imaging. Profunda Femoral Vein: No evidence of thrombus. Normal compressibility and flow on color Doppler imaging. Femoral Vein: No evidence of thrombus. Normal compressibility, respiratory phasicity and response to augmentation. Popliteal Vein: No evidence of thrombus. Normal compressibility, respiratory phasicity and response to augmentation. Calf Veins: No evidence of thrombus. Normal compressibility and flow on color Doppler imaging. Superficial Great Saphenous Vein: No evidence of thrombus. Normal compressibility. Venous Reflux:  None. Other Findings:  None. LEFT LOWER EXTREMITY Common Femoral Vein: No evidence of thrombus. Normal compressibility, respiratory phasicity and response to augmentation. Saphenofemoral Junction: No evidence of thrombus. Normal compressibility and flow on color Doppler imaging. Profunda Femoral Vein: No evidence of thrombus. Normal compressibility and flow on  color Doppler imaging. Femoral Vein: No evidence of thrombus. Normal compressibility, respiratory phasicity and response to augmentation. Popliteal Vein: No evidence of thrombus.  Normal compressibility, respiratory phasicity and response to augmentation. Calf Veins: No evidence of thrombus. Normal compressibility and flow on color Doppler imaging. Superficial Great Saphenous Vein: No evidence of thrombus. Normal compressibility. Venous Reflux:  None. Other Findings:  None. IMPRESSION: No evidence of deep venous thrombosis in either lower extremity. Electronically Signed   By: Alcide Clever M.D.   On: 11/06/2019 15:57    Procedures Procedures (including critical care time)  Medications Ordered in ED Medications - No data to display  ED Course  I have reviewed the triage vital signs and the nursing notes.  Pertinent labs & imaging results that were available during my care of the patient were reviewed by me and considered in my medical decision making (see chart for details).  Patient seen and examined. I am uncertain as to what is causing the patient's constellation of symptoms. Will start with labs, UA, doppler to r/o clot in the leg causing pelvic pain and leg swelling.   Vital signs reviewed and are as follows: BP 121/83 (BP Location: Right Arm)   Pulse (!) 122   Temp 99 F (37.2 C) (Oral)   Resp 18   Ht 5\' 4"  (1.626 m)   Wt 90.7 kg   LMP  (LMP Unknown) Comment: Cont. OCP  SpO2 95%   BMI 34.33 kg/m   Doppler was negative.  Discussed CT imaging with patient and she agrees to proceed.  CT demonstrates epiploic appendagitis.  Patient discussed with Dr. who has seen patient.  Agrees no indication for emergent MRI of the back given her paresthesias.    I discussed findings tonight with the patient.  We discussed use of pain medication and NSAIDs for epiploic appendagitis.  Discussed that it appears the appendix is normal, however no test is 100% accurate.  If she develops worsening  pain, fevers, persistent vomiting --she needs to return or see your doctor as soon as possible for recheck.  Patient counseled on use of narcotic pain medications. Counseled not to combine these medications with others containing tylenol. Urged not to drink alcohol, drive, or perform any other activities that requires focus while taking these medications. The patient verbalizes understanding and agrees with the plan.  We do not have a great reason for her leg swelling or paresthesias/numbness tonight.  She does not have any weakness and has not developed any progressive symptoms which would necessitate MRI of the spinal cord at this point.  We discussed that if she develops weakness in the arms or the legs, difficulty walking --these symptoms would be much more concerning and she should return immediately.  Patient verbalizes understanding agrees with plan.     MDM Rules/Calculators/A&P                          Pelvic pain: Vitals are stable, no fever. Labs slight leukocytosis. Imaging CT with signs of epiploic appendagitis, normal-appearing appendix. No signs of dehydration, patient is tolerating PO's. Lungs are clear and no signs suggestive of PNA. Low concern for appendicitis, cholecystitis, pancreatitis, ruptured viscus, UTI, kidney stone, aortic dissection, aortic aneurysm or other emergent abdominal etiology. Supportive therapy indicated with return if symptoms worsen.   Leg swelling: Unclear etiology.  Evaluated with Doppler which does not demonstrate DVT.  No compressive lesions in the abdomen.  No signs of liver failure, heart failure, nephrotic syndrome on lab work-up.  Paresthesias: Patient has decreased sensation in her legs.  No weakness.  Unclear etiology.  Potentially  related to leg swelling.  At this point she does not have any symptoms which would be concerning for stroke, MS, transverse myelitis.  Symptoms are atypical.  We will have her monitor closely and follow-up with her doctor  to ensure improvement, or for further testing if they persist.    Final Clinical Impression(s) / ED Diagnoses Final diagnoses:  Epiploic appendagitis  Lower abdominal pain  Leg swelling  Paresthesias    Rx / DC Orders ED Discharge Orders    None       Renne CriglerGeiple, Tnya Ades, Cordelia Poche-C 11/06/19 2133    Maia PlanLong, Sherma Vanmetre G, MD 11/08/19 [phone]

## 2019-11-08 ENCOUNTER — Emergency Department (HOSPITAL_BASED_OUTPATIENT_CLINIC_OR_DEPARTMENT_OTHER): Payer: Medicaid Other

## 2019-11-08 ENCOUNTER — Encounter (HOSPITAL_BASED_OUTPATIENT_CLINIC_OR_DEPARTMENT_OTHER): Payer: Self-pay

## 2019-11-08 ENCOUNTER — Emergency Department (HOSPITAL_BASED_OUTPATIENT_CLINIC_OR_DEPARTMENT_OTHER)
Admission: EM | Admit: 2019-11-08 | Discharge: 2019-11-08 | Disposition: A | Discharge status: Home / Self Care | Payer: Medicaid Other | Attending: Emergency Medicine | Admitting: Emergency Medicine

## 2019-11-08 ENCOUNTER — Other Ambulatory Visit: Payer: Self-pay

## 2019-11-08 DIAGNOSIS — K6389 Other specified diseases of intestine: Secondary | ICD-10-CM

## 2019-11-08 DIAGNOSIS — R109 Unspecified abdominal pain: Secondary | ICD-10-CM | POA: Diagnosis present

## 2019-11-08 DIAGNOSIS — K650 Generalized (acute) peritonitis: Secondary | ICD-10-CM | POA: Insufficient documentation

## 2019-11-08 DIAGNOSIS — R0602 Shortness of breath: Secondary | ICD-10-CM | POA: Diagnosis not present

## 2019-11-08 DIAGNOSIS — R202 Paresthesia of skin: Secondary | ICD-10-CM | POA: Diagnosis not present

## 2019-11-08 DIAGNOSIS — J45909 Unspecified asthma, uncomplicated: Secondary | ICD-10-CM | POA: Diagnosis not present

## 2019-11-08 DIAGNOSIS — F17210 Nicotine dependence, cigarettes, uncomplicated: Secondary | ICD-10-CM | POA: Diagnosis not present

## 2019-11-08 DIAGNOSIS — R609 Edema, unspecified: Secondary | ICD-10-CM

## 2019-11-08 DIAGNOSIS — R600 Localized edema: Secondary | ICD-10-CM

## 2019-11-08 DIAGNOSIS — R350 Frequency of micturition: Secondary | ICD-10-CM | POA: Diagnosis not present

## 2019-11-08 LAB — URINALYSIS, ROUTINE W REFLEX MICROSCOPIC
Bilirubin Urine: NEGATIVE
Glucose, UA: NEGATIVE mg/dL
Hgb urine dipstick: NEGATIVE
Ketones, ur: NEGATIVE mg/dL
Leukocytes,Ua: NEGATIVE
Nitrite: NEGATIVE
Protein, ur: NEGATIVE mg/dL
Specific Gravity, Urine: >1.03 — ABNORMAL HIGH (ref 1.005–1.030)
pH: 5.5 (ref 5.0–8.0)

## 2019-11-08 LAB — CBC WITH DIFFERENTIAL/PLATELET
Abs Immature Granulocytes: 0.04 10*3/uL (ref 0.00–0.07)
Basophils Absolute: 0 10*3/uL (ref 0.0–0.1)
Basophils Relative: 0 %
Eosinophils Absolute: 0.2 10*3/uL (ref 0.0–0.5)
Eosinophils Relative: 2 %
HCT: 39.6 % (ref 36.0–46.0)
Hemoglobin: 12.7 g/dL (ref 12.0–15.0)
Immature Granulocytes: 0 %
Lymphocytes Relative: 23 %
Lymphs Abs: 2.2 10*3/uL (ref 0.7–4.0)
MCH: 30 pg (ref 26.0–34.0)
MCHC: 32.1 g/dL (ref 30.0–36.0)
MCV: 93.6 fL (ref 80.0–100.0)
Monocytes Absolute: 0.6 10*3/uL (ref 0.1–1.0)
Monocytes Relative: 6 %
Neutro Abs: 6.8 10*3/uL (ref 1.7–7.7)
Neutrophils Relative %: 69 %
Platelets: 253 10*3/uL (ref 150–400)
RBC: 4.23 MIL/uL (ref 3.87–5.11)
RDW: 14.6 % (ref 11.5–15.5)
WBC: 9.8 10*3/uL (ref 4.0–10.5)
nRBC: 0 % (ref 0.0–0.2)

## 2019-11-08 LAB — COMPREHENSIVE METABOLIC PANEL
ALT: 34 U/L (ref 0–44)
AST: 31 U/L (ref 15–41)
Albumin: 3.4 g/dL — ABNORMAL LOW (ref 3.5–5.0)
Alkaline Phosphatase: 116 U/L (ref 38–126)
Anion gap: 10 (ref 5–15)
BUN: 13 mg/dL (ref 6–20)
CO2: 22 mmol/L (ref 22–32)
Calcium: 8.4 mg/dL — ABNORMAL LOW (ref 8.9–10.3)
Chloride: 104 mmol/L (ref 98–111)
Creatinine, Ser: 0.79 mg/dL (ref 0.44–1.00)
GFR calc Af Amer: >60 mL/min (ref >60)
GFR calc non Af Amer: >60 mL/min (ref >60)
Glucose, Bld: 102 mg/dL — ABNORMAL HIGH (ref 70–99)
Potassium: 3.8 mmol/L (ref 3.5–5.1)
Sodium: 136 mmol/L (ref 135–145)
Total Bilirubin: 0.4 mg/dL (ref 0.3–1.2)
Total Protein: 6.8 g/dL (ref 6.5–8.1)

## 2019-11-08 LAB — BRAIN NATRIURETIC PEPTIDE: B Natriuretic Peptide: 82.4 pg/mL (ref 0.0–100.0)

## 2019-11-08 LAB — TROPONIN I (HIGH SENSITIVITY): Troponin I (High Sensitivity): <2 ng/L (ref <18)

## 2019-11-08 MED ORDER — OXYCODONE-ACETAMINOPHEN 5-325 MG PO TABS: 1.0000 | ORAL_TABLET | Freq: Four times a day (QID) | ORAL | 0 refills | Status: DC | PRN

## 2019-11-08 MED ORDER — FUROSEMIDE 20 MG PO TABS
20.0000 mg | ORAL_TABLET | Freq: Every day | ORAL | 0 refills | Status: DC
Start: 2019-11-08 — End: 2020-01-04

## 2019-11-08 MED ORDER — KETOROLAC TROMETHAMINE 30 MG/ML IJ SOLN
30.0000 mg | Freq: Once | INTRAMUSCULAR | Status: AC
  Administered 2019-11-08: 30 mg via INTRAVENOUS
  Filled 2019-11-08: qty 1

## 2019-11-08 MED ORDER — IOHEXOL 300 MG/ML  SOLN
100.0000 mL | Freq: Once | INTRAMUSCULAR | Status: AC | PRN
  Administered 2019-11-08: 100 mL via INTRAVENOUS

## 2019-11-08 MED ORDER — HYDROMORPHONE HCL 1 MG/ML IJ SOLN
1.0000 mg | Freq: Once | INTRAMUSCULAR | Status: AC
  Administered 2019-11-08: 1 mg via INTRAVENOUS
  Filled 2019-11-08: qty 1

## 2019-11-08 NOTE — ED Provider Notes (Signed)
[facility] EMERGENCY DEPARTMENT Provider Note   CSN: [id number] Arrival date & time: 11/08/19  1515     History Chief Complaint  Patient presents with  . Abdominal Pain    Diane Stein is a 34 y.o. female.  HPI Patient presents with worsening abdominal pain. Was seen in the ER 2 days ago diagnosed with epiploic appendagitis at that time. Had had some numbness and swelling in her feet of the time but states that the abdominal pain has gotten worse and that the swelling in her legs gotten worse. States she normally has skinny legs and now she has thick legs. No fevers. Has had some nausea. States the pain is getting worse both in the legs and her abdomen. States she is also having more shortness of breath. Is having trouble walking although she thinks it could be due to the swelling and pain but also feels more short of breath. Had a negative ultrasound of her lower extremities 2 days ago. Patient had her second Covid vaccine around 11 days ago. The tenderness of her abdomen began around 3 days ago. Is in the right lower abdomen.    Past Medical History:  Diagnosis Date  . Anxiety   . Asthma    as a child  . Bronchitis    history  . Depression   . Dysrhythmia    rapid heartrate  . Pneumonia 2014   History  . Smoker    0.5 ppd x 10 yrs  . SVD (spontaneous vaginal delivery)    x 1  . Tachycardia 2016    Patient Active Problem List   Diagnosis Date Noted  . Post-op pneumonia 03/14/2018  . 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 Pelvic pain in female 08/28/2017  . Paratubal cyst 08/28/2017  . Chest pain at rest 12/26/2016  . Dizziness 12/26/2016  . Syncope and collapse 08/23/2016  . Depression   . Anxiety   . SVD (spontaneous vaginal delivery) 11/08/2014  . Irregular uterine contractions 11/07/2014  . Palpitations 05/12/2014  . Paroxysmal supraventricular tachycardia ([place]) 05/12/2014  . Pregnancy  05/12/2014    Past Surgical History:  Procedure Laterality Date  . ABDOMINAL HYSTERECTOMY    . HYSTERECTOMY ABDOMINAL WITH SALPINGO-OOPHORECTOMY Bilateral 03/10/2018   Procedure: TOTAL ABDOMINAL HYSTERECTOMY WITH BILATERAL SALPINGO-OOPHORECTOMY;  Surgeon: Cheri Fowler, MD;  Location: [facility];  Service: Gynecology;  Laterality: Bilateral;  . LAPAROSCOPIC UNILATERAL SALPINGECTOMY Left 08/28/2017   Procedure: LAPAROSCOPIC UNILATERAL SALPINGECTOMY WITH REMOVAL OF PARA TUBAL CYST;  Surgeon: Cheri Fowler, MD;  Location: [facility];  Service: Gynecology;  Laterality: Left;  . LAPAROSCOPY Left 08/28/2017   Procedure: LAPAROSCOPY OPERATIVE, FULGERATION OF ENDOMETRIOSIS;  Surgeon: Cheri Fowler, MD;  Location: [facility];  Service: Gynecology;  Laterality: Left;  . OVARIAN CYST SURGERY    . TOOTH EXTRACTION Right 2008     OB History    Gravida  5   Para  1   Term  1   Preterm      AB  3   Living  1     SAB  3   TAB      Ectopic      Multiple  0   Live Births  1           Family History  Problem Relation Age of Onset  . Hypertension Mother   . Heart disease Father   . Heart disease Maternal Grandmother   . Hypertension Maternal Grandmother   . Heart  disease Maternal Grandfather   . Hypertension Maternal Grandfather   . Heart disease Paternal Grandmother   . Hypertension Paternal Grandmother   . Heart disease Paternal Grandfather   . Hypertension Paternal Grandfather     Social History   Tobacco Use  . Smoking status: Current Every Day Smoker    Packs/day: 0.50    Years: 10.00    Pack years: 5.00    Types: Cigarettes  . Smokeless tobacco: Never Used  Vaping Use  . Vaping Use: Never used  Substance Use Topics  . Alcohol use: Yes    Alcohol/week: 5.0 standard drinks    Types: 5 Glasses of wine per week  . Drug use: No    Home Medications Prior to Admission medications   Medication Sig Start Date End Date Taking? Authorizing Provider  albuterol (VENTOLIN  HFA) 108 (90 Base) MCG/ACT inhaler Inhale 2 puffs into the lungs every 6 (six) hours as needed for wheezing or shortness of breath.  09/13/18   [provider]  ALPRAZolam (XANAX XR) 1 MG 24 hr tablet Take 1 mg by mouth daily. Takes first thing in the morning    [provider]  alprazolam Prudy Feeler) 2 MG tablet Take 2 mg by mouth 2 (two) times daily. Takes 1st dose at 78 Noon and second dose a few hours later    [provider]  dicyclomine (BENTYL) 20 MG tablet Take 1 tablet (20 mg total) by mouth 2 (two) times daily as needed for up to 4 days for spasms. 04/22/18 04/26/18  Little, Ambrose Finland, MD  doxepin (SINEQUAN) 50 MG capsule Take 50-100 mg by mouth at bedtime as needed (sleep).     [provider]  EPINEPHrine 0.3 mg/0.3 mL IJ SOAJ injection Inject 0.3 mLs (0.3 mg total) into the muscle once as needed for up to 1 dose (anaphalaxis). 01/09/18   Loren Racer, MD  estradiol (ESTRACE) 2 MG tablet Take 2 mg by mouth daily. 11/30/18   [provider]  famotidine (PEPCID) 20 MG tablet Take 1 tablet (20 mg total) by mouth 2 (two) times daily for 6 days. 12/29/18 01/04/19  Albrizze, Kaitlyn E, PA-C  FLUoxetine (PROZAC) 20 MG capsule Take 60 mg by mouth daily.    [provider]  furosemide (LASIX) 20 MG tablet Take 1 tablet (20 mg total) by mouth daily. 11/08/19   Benjiman Core, MD  hydrOXYzine (VISTARIL) 50 MG capsule Take 1 capsule (50 mg total) by mouth 4 (four) times daily as needed for itching or vomiting. 12/29/18   Albrizze, Yvonna Alanis E, PA-C  naproxen (NAPROSYN) 500 MG tablet Take 1 tablet (500 mg total) by mouth 2 (two) times daily. 11/06/19   Renne Crigler, PA-C  ondansetron (ZOFRAN ODT) 4 MG disintegrating tablet Take 1 tablet (4 mg total) by mouth every 8 (eight) hours as needed for nausea or vomiting. 12/29/18   Albrizze, Caroleen Hamman, PA-C  oxyCODONE-acetaminophen (PERCOCET/ROXICET) 5-325 MG tablet Take 1 tablet by mouth every 6 (six) hours as  needed for severe pain. 11/08/19   Benjiman Core, MD  traMADol (ULTRAM) 50 MG tablet Take 1 tablet (50 mg total) by mouth every 6 (six) hours as needed. 04/26/19   Geoffery Lyons, MD    Allergies    Avocado, Other, Benadryl [diphenhydramine hcl], and Benadryl [diphenhydramine]  Review of Systems   Review of Systems  Constitutional: Positive for appetite change.  HENT: Negative for congestion.   Respiratory: Positive for shortness of breath. Negative for wheezing.   Cardiovascular:  Positive for leg swelling.  Gastrointestinal: Positive for abdominal pain.  Genitourinary: Positive for frequency. Negative for flank pain.  Musculoskeletal: Negative for back pain.  Skin: Negative for rash.  Neurological: Positive for numbness.  Psychiatric/Behavioral: Negative for confusion.    Physical Exam Updated Vital Signs BP 122/81 (BP Location: Right Arm)   Pulse 90   Temp 98.6 F (37 C)   Resp 20   Ht 5\' 4"  (1.626 m)   Wt 90.7 kg   LMP  (LMP Unknown) Comment: Cont. OCP  SpO2 98%   BMI 34.33 kg/m   Physical Exam Vitals and nursing note reviewed.  HENT:     Head: Normocephalic.  Cardiovascular:     Rate and Rhythm: Normal rate and regular rhythm.  Pulmonary:     Comments: Some harsh breath sounds bilateral bases. Abdominal:     Comments: Moderate right lower quadrant tenderness.  Skin:    General: Skin is warm.     Comments: Pitting edema bilateral lower legs ankles and feet. Moderate.  Neurological:     Mental Status: She is alert.     ED Results / Procedures / Treatments   Labs (all labs ordered are listed, but only abnormal results are displayed) Labs Reviewed  COMPREHENSIVE METABOLIC PANEL - Abnormal; Notable for the following components:      Result Value   Glucose, Bld 102 (*)    Calcium 8.4 (*)    Albumin 3.4 (*)    All other components within normal limits  URINALYSIS, ROUTINE W REFLEX MICROSCOPIC - Abnormal; Notable for the following components:   Specific  Gravity, Urine >1.030 (*)    All other components within normal limits  CBC WITH DIFFERENTIAL/PLATELET  BRAIN NATRIURETIC PEPTIDE  TROPONIN I (HIGH SENSITIVITY)    EKG EKG Interpretation  Date/Time:  Tuesday November 08 2019 15:54:09 EDT Ventricular Rate:  99 PR Interval:    QRS Duration: 88 QT Interval:  349 QTC Calculation: 448 R Axis:   77 Text Interpretation: Sinus rhythm No significant change since last tracing Confirmed by [id number] [phone]) on 11/08/2019 3:58:06 PM   Radiology CT ABDOMEN PELVIS W CONTRAST  Result Date: 11/08/2019 CLINICAL DATA:  Worsening abdominal pain for several days, lower extremity edema, nausea EXAM: CT ABDOMEN AND PELVIS WITH CONTRAST TECHNIQUE: Multidetector CT imaging of the abdomen and pelvis was performed using the standard protocol following bolus administration of intravenous contrast. CONTRAST:  11/10/2019 OMNIPAQUE IOHEXOL 300 MG/ML  SOLN COMPARISON:  11/06/2019 FINDINGS: Lower chest: Hypoventilatory changes are seen at the lung bases. No acute pleural or parenchymal lung disease. Hepatobiliary: No focal liver abnormality is seen. No gallstones, gallbladder wall thickening, or biliary dilatation. Pancreas: Unremarkable. No pancreatic ductal dilatation or surrounding inflammatory changes. Spleen: Normal in size without focal abnormality. Adrenals/Urinary Tract: Adrenal glands are unremarkable. Kidneys are normal, without renal calculi, focal lesion, or hydronephrosis. Bladder is unremarkable. Stomach/Bowel: No bowel obstruction or ileus. Normal caliber appendix is seen in the right lower quadrant, with gas seen throughout the appendiceal loop. There is no appendiceal wall thickening or abnormal enhancement. Once again, arising from the region of the mid sigmoid colon, there are inflammatory changes within the pericolonic fat. The adjacent: Appears normal. Findings are most consistent with persistent epiploic appendagitis. There is no significant change since  prior exam. No fluid collection or abscess. Vascular/Lymphatic: No significant vascular findings are present. No enlarged abdominal or pelvic lymph nodes. Reproductive: Status post hysterectomy. No adnexal masses. Other: No free fluid or free gas.  No  abdominal wall hernia. Musculoskeletal: No acute or destructive bony lesions. Reconstructed images demonstrate no additional findings. IMPRESSION: 1. Stable inflammatory changes within the mesentery adjacent to the sigmoid colon, consistent with persistent stable epiploic appendagitis. The adjacent sigmoid colon and distal margin of the appendix appear unremarkable without signs of bowel inflammation or appendicitis. Electronically Signed   By: Sharlet Salina M.D.   On: 11/08/2019 17:20   DG Chest Portable 1 View  Result Date: 11/08/2019 CLINICAL DATA:  Increasing abdominal pain over the past few days. EXAM: PORTABLE CHEST 1 VIEW COMPARISON:  PA and lateral chest 12/29/2018. FINDINGS: Lungs clear. Heart size normal. No pneumothorax or pleural fluid. No acute or focal bony abnormality. IMPRESSION: Negative chest. Electronically Signed   By: Drusilla Kanner M.D.   On: 11/08/2019 16:17    Procedures Procedures (including critical care time)  Medications Ordered in ED Medications  HYDROmorphone (DILAUDID) injection 1 mg (1 mg Intravenous Given 11/08/19 1608)  iohexol (OMNIPAQUE) 300 MG/ML solution 100 mL (100 mLs Intravenous Contrast Given 11/08/19 1658)  ketorolac (TORADOL) 30 MG/ML injection 30 mg (30 mg Intravenous Given 11/08/19 1747)    ED Course  I have reviewed the triage vital signs and the nursing notes.  Pertinent labs & imaging results that were available during my care of the patient were reviewed by me and considered in my medical decision making (see chart for details).    MDM Rules/Calculators/A&P                          Patient with leg swelling.  Recent work-up showed negative Doppler.  Good renal function and good liver function.   Albumin only slightly below normal.  Does have known epiploic appendagitis.  With continued pain followed up with CT as suggested a few days ago on the scan.  CT scan shows stable findings.  Will discharge home.  Will give pain medicine and short course of Lasix.  PCP follow-up. Final Clinical Impression(s) / ED Diagnoses Final diagnoses:  Epiploic appendagitis  Peripheral edema    Rx / DC Orders ED Discharge Orders         Ordered    oxyCODONE-acetaminophen (PERCOCET/ROXICET) 5-325 MG tablet  Every 6 hours PRN     Discontinue  Reprint     11/08/19 1846    furosemide (LASIX) 20 MG tablet  Daily     Discontinue  Reprint     11/08/19 1846           Benjiman Core, MD 11/08/19 1848

## 2019-11-08 NOTE — ED Notes (Signed)
Provider bedside.

## 2019-11-08 NOTE — Discharge Instructions (Addendum)
Follow-up with your doctor for the leg swelling and continued abdominal pain.

## 2019-11-08 NOTE — ED Notes (Signed)
Pt ambulatory with steady gait to provide urine specimen with stand by assist

## 2019-11-08 NOTE — ED Triage Notes (Signed)
Pt arrives with worsening abdominal pain since last visit a few days ago with increased pain and swelling to feet. Pt also c/o nausea.

## 2019-11-08 NOTE — ED Notes (Signed)
Pharmacy and medications updated with patient, requests printed script at discharge.

## 2019-12-16 ENCOUNTER — Encounter (HOSPITAL_BASED_OUTPATIENT_CLINIC_OR_DEPARTMENT_OTHER): Payer: Self-pay

## 2019-12-16 ENCOUNTER — Emergency Department (HOSPITAL_BASED_OUTPATIENT_CLINIC_OR_DEPARTMENT_OTHER)
Admission: EM | Admit: 2019-12-16 | Discharge: 2019-12-16 | Disposition: A | Discharge status: Home / Self Care | Payer: Medicaid Other | Attending: Emergency Medicine | Admitting: Emergency Medicine

## 2019-12-16 ENCOUNTER — Other Ambulatory Visit: Payer: Self-pay

## 2019-12-16 ENCOUNTER — Emergency Department (HOSPITAL_BASED_OUTPATIENT_CLINIC_OR_DEPARTMENT_OTHER): Payer: Medicaid Other

## 2019-12-16 DIAGNOSIS — R109 Unspecified abdominal pain: Secondary | ICD-10-CM | POA: Diagnosis present

## 2019-12-16 DIAGNOSIS — M25561 Pain in right knee: Secondary | ICD-10-CM | POA: Diagnosis not present

## 2019-12-16 DIAGNOSIS — Z79899 Other long term (current) drug therapy: Secondary | ICD-10-CM | POA: Insufficient documentation

## 2019-12-16 DIAGNOSIS — F17210 Nicotine dependence, cigarettes, uncomplicated: Secondary | ICD-10-CM | POA: Insufficient documentation

## 2019-12-16 DIAGNOSIS — J45909 Unspecified asthma, uncomplicated: Secondary | ICD-10-CM | POA: Insufficient documentation

## 2019-12-16 LAB — COMPREHENSIVE METABOLIC PANEL
ALT: 26 U/L (ref 0–44)
AST: 20 U/L (ref 15–41)
Albumin: 4 g/dL (ref 3.5–5.0)
Alkaline Phosphatase: 116 U/L (ref 38–126)
Anion gap: 13 (ref 5–15)
BUN: 11 mg/dL (ref 6–20)
CO2: 23 mmol/L (ref 22–32)
Calcium: 8.8 mg/dL — ABNORMAL LOW (ref 8.9–10.3)
Chloride: 103 mmol/L (ref 98–111)
Creatinine, Ser: 0.74 mg/dL (ref 0.44–1.00)
GFR calc Af Amer: >60 mL/min (ref >60)
GFR calc non Af Amer: >60 mL/min (ref >60)
Glucose, Bld: 101 mg/dL — ABNORMAL HIGH (ref 70–99)
Potassium: 3.8 mmol/L (ref 3.5–5.1)
Sodium: 139 mmol/L (ref 135–145)
Total Bilirubin: 0.4 mg/dL (ref 0.3–1.2)
Total Protein: 7.8 g/dL (ref 6.5–8.1)

## 2019-12-16 LAB — CBC WITH DIFFERENTIAL/PLATELET
Abs Immature Granulocytes: 0.06 10*3/uL (ref 0.00–0.07)
Basophils Absolute: 0 10*3/uL (ref 0.0–0.1)
Basophils Relative: 0 %
Eosinophils Absolute: 0.1 10*3/uL (ref 0.0–0.5)
Eosinophils Relative: 1 %
HCT: 44.4 % (ref 36.0–46.0)
Hemoglobin: 14.5 g/dL (ref 12.0–15.0)
Immature Granulocytes: 1 %
Lymphocytes Relative: 24 %
Lymphs Abs: 3.1 10*3/uL (ref 0.7–4.0)
MCH: 30.1 pg (ref 26.0–34.0)
MCHC: 32.7 g/dL (ref 30.0–36.0)
MCV: 92.3 fL (ref 80.0–100.0)
Monocytes Absolute: 0.6 10*3/uL (ref 0.1–1.0)
Monocytes Relative: 4 %
Neutro Abs: 8.9 10*3/uL — ABNORMAL HIGH (ref 1.7–7.7)
Neutrophils Relative %: 70 %
Platelets: 288 10*3/uL (ref 150–400)
RBC: 4.81 MIL/uL (ref 3.87–5.11)
RDW: 14.2 % (ref 11.5–15.5)
WBC: 12.7 10*3/uL — ABNORMAL HIGH (ref 4.0–10.5)
nRBC: 0 % (ref 0.0–0.2)

## 2019-12-16 LAB — LIPASE, BLOOD: Lipase: 29 U/L (ref 11–51)

## 2019-12-16 MED ORDER — IOHEXOL 300 MG/ML  SOLN
100.0000 mL | Freq: Once | INTRAMUSCULAR | Status: AC | PRN
  Administered 2019-12-16: 100 mL via INTRAVENOUS

## 2019-12-16 MED ORDER — HYDROCODONE-ACETAMINOPHEN 5-325 MG PO TABS: 1.0000 | ORAL_TABLET | Freq: Once | ORAL | Status: DC

## 2019-12-16 MED ORDER — MORPHINE SULFATE (PF) 4 MG/ML IV SOLN
4.0000 mg | Freq: Once | INTRAVENOUS | Status: AC
  Administered 2019-12-16: 4 mg via INTRAVENOUS
  Filled 2019-12-16: qty 1

## 2019-12-16 MED ORDER — SODIUM CHLORIDE 0.9 % IV BOLUS
1000.0000 mL | Freq: Once | INTRAVENOUS | Status: AC
  Administered 2019-12-16: 1000 mL via INTRAVENOUS

## 2019-12-16 MED ORDER — TRAMADOL HCL 50 MG PO TABS
50.0000 mg | ORAL_TABLET | Freq: Once | ORAL | Status: AC
  Administered 2019-12-16: 50 mg via ORAL
  Filled 2019-12-16: qty 1

## 2019-12-16 NOTE — ED Provider Notes (Signed)
[facility] EMERGENCY DEPARTMENT Provider Note   CSN: [id number] Arrival date & time: 12/16/19  1445     History Chief Complaint  Patient presents with  . Abdominal Pain    Diane Stein is a 34 y.o. female.  Patient arrives with multiple complaints.  First complaint of left shoulder pain with some paresthesias on the left arm.  Denies any specific trauma.  Nothing makes it worse or better.  Patient also with bilateral knee pain somewhat chronic in nature.  Fell on the right knee earlier today.  Patient also with some right-sided abdominal pain also somewhat chronic in nature but worse over the last several days.  Multiple CT scans recently that showed epiploic appendagitis.  Patient denies any nausea or vomiting.  No fever chills.  History of tachycardia and states that she takes Xanax for that.  The history is provided by the patient.  Abdominal Pain Pain location:  RUQ and RLQ Pain quality: aching   Pain radiates to:  Does not radiate Pain severity:  Mild Onset quality:  Gradual Timing:  Intermittent Progression:  Waxing and waning Chronicity:  Recurrent Relieved by:  Nothing Worsened by:  Nothing Associated symptoms: no chest pain, no chills, no cough, no dysuria, no fever, no hematuria, no shortness of breath, no sore throat and no vomiting        Past Medical History:  Diagnosis Date  . Anxiety   . Asthma    as a child  . Bronchitis    history  . Depression   . Dysrhythmia    rapid heartrate  . Pneumonia 2014   History  . Smoker    0.5 ppd x 10 yrs  . SVD (spontaneous vaginal delivery)    x 1  . Tachycardia 2016    Patient Active Problem List   Diagnosis Date Noted  . Post-op pneumonia 03/14/2018  . 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 Pelvic pain in female 08/28/2017  . Paratubal cyst 08/28/2017  . Chest pain at rest 12/26/2016  . Dizziness 12/26/2016  . Syncope and collapse  08/23/2016  . Depression   . Anxiety   . SVD (spontaneous vaginal delivery) 11/08/2014  . Irregular uterine contractions 11/07/2014  . Palpitations 05/12/2014  . Paroxysmal supraventricular tachycardia (HCC) 05/12/2014  . Pregnancy 05/12/2014    Past Surgical History:  Procedure Laterality Date  . ABDOMINAL HYSTERECTOMY    . HYSTERECTOMY ABDOMINAL WITH SALPINGO-OOPHORECTOMY Bilateral 03/10/2018   Procedure: TOTAL ABDOMINAL HYSTERECTOMY WITH BILATERAL SALPINGO-OOPHORECTOMY;  Surgeon: Lavina HammanMeisinger, Todd, MD;  Location: [facility];  Service: Gynecology;  Laterality: Bilateral;  . LAPAROSCOPIC UNILATERAL SALPINGECTOMY Left 08/28/2017   Procedure: LAPAROSCOPIC UNILATERAL SALPINGECTOMY WITH REMOVAL OF PARA TUBAL CYST;  Surgeon: Lavina HammanMeisinger, Todd, MD;  Location: [facility];  Service: Gynecology;  Laterality: Left;  . LAPAROSCOPY Left 08/28/2017   Procedure: LAPAROSCOPY OPERATIVE, FULGERATION OF ENDOMETRIOSIS;  Surgeon: Lavina HammanMeisinger, Todd, MD;  Location: [facility];  Service: Gynecology;  Laterality: Left;  . OVARIAN CYST SURGERY    . TOOTH EXTRACTION Right 2008     OB History    Gravida  5   Para  1   Term  1   Preterm      AB  3   Living  1     SAB  3   TAB      Ectopic      Multiple  0   Live Births  1  Family History  Problem Relation Age of Onset  . Hypertension Mother   . Heart disease Father   . Heart disease Maternal Grandmother   . Hypertension Maternal Grandmother   . Heart disease Maternal Grandfather   . Hypertension Maternal Grandfather   . Heart disease Paternal Grandmother   . Hypertension Paternal Grandmother   . Heart disease Paternal Grandfather   . Hypertension Paternal Grandfather     Social History   Tobacco Use  . Smoking status: Current Every Day Smoker    Packs/day: 0.50    Years: 10.00    Pack years: 5.00    Types: Cigarettes  . Smokeless tobacco: Never Used  Vaping Use  . Vaping Use: Never used  Substance Use Topics  . Alcohol use: Yes      Alcohol/week: 5.0 standard drinks    Types: 5 Glasses of wine per week  . Drug use: No    Home Medications Prior to Admission medications   Medication Sig Start Date End Date Taking? Authorizing Provider  albuterol (VENTOLIN HFA) 108 (90 Base) MCG/ACT inhaler Inhale 2 puffs into the lungs every 6 (six) hours as needed for wheezing or shortness of breath.  09/13/18   [provider]  ALPRAZolam (XANAX XR) 1 MG 24 hr tablet Take 1 mg by mouth daily. Takes first thing in the morning    [provider]  alprazolam Prudy Feeler) 2 MG tablet Take 2 mg by mouth 2 (two) times daily. Takes 1st dose at 68 Noon and second dose a few hours later    [provider]  dicyclomine (BENTYL) 20 MG tablet Take 1 tablet (20 mg total) by mouth 2 (two) times daily as needed for up to 4 days for spasms. 04/22/18 04/26/18  Little, Ambrose Finland, MD  doxepin (SINEQUAN) 50 MG capsule Take 50-100 mg by mouth at bedtime as needed (sleep).     [provider]  EPINEPHrine 0.3 mg/0.3 mL IJ SOAJ injection Inject 0.3 mLs (0.3 mg total) into the muscle once as needed for up to 1 dose (anaphalaxis). 01/09/18   Loren Racer, MD  estradiol (ESTRACE) 2 MG tablet Take 2 mg by mouth daily. 11/30/18   [provider]  famotidine (PEPCID) 20 MG tablet Take 1 tablet (20 mg total) by mouth 2 (two) times daily for 6 days. 12/29/18 01/04/19  Albrizze, Kaitlyn E, PA-C  FLUoxetine (PROZAC) 20 MG capsule Take 60 mg by mouth daily.    [provider]  furosemide (LASIX) 20 MG tablet Take 1 tablet (20 mg total) by mouth daily. 11/08/19   Benjiman Core, MD  hydrOXYzine (VISTARIL) 50 MG capsule Take 1 capsule (50 mg total) by mouth 4 (four) times daily as needed for itching or vomiting. 12/29/18   Albrizze, Yvonna Alanis E, PA-C  naproxen (NAPROSYN) 500 MG tablet Take 1 tablet (500 mg total) by mouth 2 (two) times daily. 11/06/19   Renne Crigler, PA-C  ondansetron (ZOFRAN ODT) 4 MG disintegrating tablet  Take 1 tablet (4 mg total) by mouth every 8 (eight) hours as needed for nausea or vomiting. 12/29/18   Albrizze, Caroleen Hamman, PA-C  oxyCODONE-acetaminophen (PERCOCET/ROXICET) 5-325 MG tablet Take 1 tablet by mouth every 6 (six) hours as needed for severe pain. 11/08/19   Benjiman Core, MD  traMADol (ULTRAM) 50 MG tablet Take 1 tablet (50 mg total) by mouth every 6 (six) hours as needed. 04/26/19   Geoffery Lyons, MD    Allergies    Avocado, Other, Benadryl [diphenhydramine hcl], and  Benadryl [diphenhydramine]  Review of Systems   Review of Systems  Constitutional: Negative for chills and fever.  HENT: Negative for ear pain and sore throat.   Eyes: Negative for pain and visual disturbance.  Respiratory: Negative for cough and shortness of breath.   Cardiovascular: Negative for chest pain and palpitations.  Gastrointestinal: Positive for abdominal pain. Negative for vomiting.  Genitourinary: Negative for dysuria and hematuria.  Musculoskeletal: Positive for arthralgias and gait problem. Negative for back pain.  Skin: Negative for color change and rash.  Neurological: Negative for dizziness, tremors, seizures, syncope, facial asymmetry, weakness, light-headedness, numbness and headaches.  All other systems reviewed and are negative.   Physical Exam Updated Vital Signs  ED Triage Vitals  Enc Vitals Group     BP 12/16/19 1510 109/73     Pulse Rate 12/16/19 1510 (!) 134     Resp 12/16/19 1510 20     Temp 12/16/19 1510 99.3 F (37.4 C)     Temp Source 12/16/19 1510 Oral     SpO2 12/16/19 1510 99 %     Weight 12/16/19 1511 190 lb (86.2 kg)     Height 12/16/19 1511  (1.626 m)     Head Circumference --      Peak Flow --      Pain Score 12/16/19 1511 10     Pain Loc --      Pain Edu? --      Excl. in GC? --     Physical Exam Vitals and nursing note reviewed.  Constitutional:      General: She is not in acute distress.    Appearance: She is well-developed.  HENT:     Head:  Normocephalic and atraumatic.  Eyes:     Extraocular Movements: Extraocular movements intact.     Conjunctiva/sclera: Conjunctivae normal.     Pupils: Pupils are equal, round, and reactive to light.  Cardiovascular:     Rate and Rhythm: Normal rate and regular rhythm.     Heart sounds: Normal heart sounds. No murmur heard.   Pulmonary:     Effort: Pulmonary effort is normal. No respiratory distress.     Breath sounds: Normal breath sounds.  Abdominal:     General: Abdomen is flat. There is no distension.     Palpations: Abdomen is soft.     Tenderness: There is abdominal tenderness in the right upper quadrant, right lower quadrant, periumbilical area and suprapubic area.  Musculoskeletal:     Cervical back: Neck supple.     Comments: Tenderness to right knee with small abrasion, some tenderness in the left trapezius area  Skin:    General: Skin is warm and dry.  Neurological:     Mental Status: She is alert.     ED Results / Procedures / Treatments   Labs (all labs ordered are listed, but only abnormal results are displayed) Labs Reviewed  CBC WITH DIFFERENTIAL/PLATELET - Abnormal; Notable for the following components:      Result Value   WBC 12.7 (*)    Neutro Abs 8.9 (*)    All other components within normal limits  COMPREHENSIVE METABOLIC PANEL - Abnormal; Notable for the following components:   Glucose, Bld 101 (*)    Calcium 8.8 (*)    All other components within normal limits  LIPASE, BLOOD    EKG EKG Interpretation  Date/Time:  Friday December 16 2019 15:12:16 EDT Ventricular Rate:  139 PR Interval:    QRS Duration: 72  QT Interval:  368 QTC Calculation: 559 R Axis:   93 Text Interpretation: Sinus tachycardia Rightward axis Borderline ECG Confirmed by Virgina Norfolk [phone]) on 12/16/2019 3:34:40 PM   Radiology DG Chest 2 View  Result Date: 12/16/2019 CLINICAL DATA:  Shortness of breath. EXAM: CHEST - 2 VIEW COMPARISON:  November 08, 2019 FINDINGS: There is no  evidence of acute infiltrate, pleural effusion or pneumothorax. The heart size and mediastinal contours are within normal limits. The visualized skeletal structures are unremarkable. IMPRESSION: No active cardiopulmonary disease. Electronically Signed   By: Aram Candela M.D.   On: 12/16/2019 16:12   CT ABDOMEN PELVIS W CONTRAST  Result Date: 12/16/2019 CLINICAL DATA:  Right lower quadrant abdominal pain, EXAM: CT ABDOMEN AND PELVIS WITH CONTRAST TECHNIQUE: Multidetector CT imaging of the abdomen and pelvis was performed using the standard protocol following bolus administration of intravenous contrast. CONTRAST:  OMNIPAQUE IOHEXOL 300 MG/ML  SOLN COMPARISON:  None. FINDINGS: Lower chest: The visualized lung bases are clear bilaterally. The visualized heart and pericardium are unremarkable. Hepatobiliary: A focal area of low attenuation is seen adjacent to the right hepatic vein within segment 4 of the liver measuring 2.9 x 1.4 x 1.9 cm on axial image # 17 and coronal image # 41. This was present on prior examination of 11/08/2019, but was not present on remote prior examination of 03/14/2018. While this may simply represent an area of focal fatty infiltration, this is not well characterized on this examination. The liver is otherwise unremarkable. Gallbladder unremarkable. No intra or extrahepatic biliary ductal dilation. Pancreas: Unremarkable Spleen: Unremarkable Adrenals/Urinary Tract: The adrenal glands and kidneys are unremarkable. The bladder is unremarkable. Stomach/Bowel: The large and small bowel are unremarkable. Stomach is unremarkable. Appendix normal. No free intraperitoneal gas or fluid. Small broad-based fat containing umbilical hernia. Vascular/Lymphatic: No pathologic adenopathy within the abdomen and pelvis. The abdominal vasculature is normal. Reproductive: Uterus absent. The ovaries are absent bilaterally. No adnexal masses are seen. Other: Rectum unremarkable Musculoskeletal: No  lytic or blastic bone lesions. IMPRESSION: No radiographic explanation for the patient's reported right lower quadrant abdominal pain. Normal appendix. Interval development of a indeterminate low-attenuation lesion within segment 4 of the liver since remote prior examination of 03/14/2018. While this may simply represent focal fatty hepatic infiltration, this is indeterminate on this examination. If indicated, contrast enhanced MRI examination could be used for definitive characterization. Electronically Signed   By: Helyn Numbers MD   On: 12/16/2019 19:18   DG Knee Complete 4 Views Right  Result Date: 12/16/2019 CLINICAL DATA:  Status post fall with subsequent anterior right knee pain. EXAM: RIGHT KNEE - COMPLETE 4+ VIEW COMPARISON:  None. FINDINGS: No evidence of fracture, dislocation, or joint effusion. No evidence of arthropathy or other focal bone abnormality. Soft tissues are unremarkable. IMPRESSION: Negative. Electronically Signed   By: Aram Candela M.D.   On: 12/16/2019 16:13    Procedures Procedures (including critical care time)  Medications Ordered in ED Medications  sodium chloride 0.9 % bolus 1,000 mL (1,000 mLs Intravenous New Bag/Given 12/16/19 1807)  morphine 4 MG/ML injection 4 mg (4 mg Intravenous Given 12/16/19 1824)  iohexol (OMNIPAQUE) 300 MG/ML solution 100 mL (100 mLs Intravenous Contrast Given 12/16/19 1849)    ED Course  I have reviewed the triage vital signs and the nursing notes.  Pertinent labs & imaging results that were available during my care of the patient were reviewed by me and considered in my medical decision making (see chart for  details).    MDM Rules/Calculators/A&P                          Catrinia Racicot is a 34 year old female with history of tachycardia, anxiety presents the ED with abdominal pain.  Also with right knee pain, left shoulder pain.  Patient with unremarkable vitals except for tachycardia.  History of tachycardia and takes  anxiety meds for this.  Right-sided abdominal pain on and off for the last several weeks.  Was diagnosed with epiploic appendagitis recently.  Also has some right knee pain after a fall on the right knee.  Has been having some left shoulder pain.  X-ray of the right knee is normal and doubt any acute process.  History of possibly arthritis in this area.  Patient would like crutches and knee immobilizer that will be given.  Left shoulder pain appears muscular in nature.  Possibly spasm.  Right-sided abdominal pain concerning for possible appendicitis.  Possible UTI.  Will get lab work and CT imaging to further evaluate.  Patient given IV fluids and IV narcotics.  Lab work normal.  No concern for biliary process.  Liver function normal.  Nonspecific lesion found on liver on CT scan of abdomen and pelvis patient with primary care doctor to discuss possible MRI imaging.  Otherwise no appendicitis.  No acute findings in the abdomen.  No significant leukocytosis, anemia, electrolyte abnormality.  Knee immobilizer and crutches given.  Recommend continued use of Tylenol Motrin and ice.  Discharged in good condition.  This chart was dictated using voice recognition software.  Despite best efforts to proofread,  errors can occur which can change the documentation meaning.    Final Clinical Impression(s) / ED Diagnoses Final diagnoses:  Abdominal pain, unspecified abdominal location  Acute pain of right knee    Rx / DC Orders ED Discharge Orders    None       Virgina Norfolk, DO 12/16/19 1942

## 2019-12-16 NOTE — Discharge Instructions (Addendum)
Follow-up with your primary care doctor to discuss need for possible further imaging of your liver as discussed.  Nonspecific findings to the liver today but sometimes MRI imaging is needed on outpatient basis.  Recommend continued ice, Tylenol, Motrin, rest for right knee pain.  Likely contusion and bruise.

## 2019-12-16 NOTE — ED Notes (Signed)
Patient stated to this nurse at discharge that mother and sister were waiting for patient in parking lot.

## 2019-12-16 NOTE — ED Triage Notes (Addendum)
PT c/o L neck/shoulder pain and L knee pain x 1 week. Today pt started having some cough, ShOB, sore throat. Pt fully vaccinated for COVID. Pt is tachycardic in triage, but pt states she has a hx of same and that it is normal for her.

## 2019-12-20 ENCOUNTER — Emergency Department (HOSPITAL_COMMUNITY)
Admission: EM | Admit: 2019-12-20 | Discharge: 2019-12-21 | Disposition: A | Discharge status: Home / Self Care | Payer: Medicaid Other | Attending: Emergency Medicine | Admitting: Emergency Medicine

## 2019-12-20 ENCOUNTER — Encounter (HOSPITAL_COMMUNITY): Payer: Self-pay | Admitting: *Deleted

## 2019-12-20 ENCOUNTER — Other Ambulatory Visit: Payer: Self-pay

## 2019-12-20 DIAGNOSIS — M87051 Idiopathic aseptic necrosis of right femur: Secondary | ICD-10-CM | POA: Insufficient documentation

## 2019-12-20 DIAGNOSIS — R109 Unspecified abdominal pain: Secondary | ICD-10-CM | POA: Insufficient documentation

## 2019-12-20 DIAGNOSIS — Z7951 Long term (current) use of inhaled steroids: Secondary | ICD-10-CM | POA: Diagnosis not present

## 2019-12-20 DIAGNOSIS — F17210 Nicotine dependence, cigarettes, uncomplicated: Secondary | ICD-10-CM | POA: Insufficient documentation

## 2019-12-20 DIAGNOSIS — R600 Localized edema: Secondary | ICD-10-CM | POA: Diagnosis not present

## 2019-12-20 DIAGNOSIS — J45909 Unspecified asthma, uncomplicated: Secondary | ICD-10-CM | POA: Diagnosis not present

## 2019-12-20 LAB — URINALYSIS, ROUTINE W REFLEX MICROSCOPIC
Glucose, UA: NEGATIVE mg/dL
Hgb urine dipstick: NEGATIVE
Ketones, ur: 5 mg/dL — AB
Leukocytes,Ua: NEGATIVE
Nitrite: NEGATIVE
Protein, ur: NEGATIVE mg/dL
Specific Gravity, Urine: 1.031 — ABNORMAL HIGH (ref 1.005–1.030)
pH: 5 (ref 5.0–8.0)

## 2019-12-20 LAB — COMPREHENSIVE METABOLIC PANEL
ALT: 20 U/L (ref 0–44)
AST: 17 U/L (ref 15–41)
Albumin: 3.5 g/dL (ref 3.5–5.0)
Alkaline Phosphatase: 102 U/L (ref 38–126)
Anion gap: 9 (ref 5–15)
BUN: 7 mg/dL (ref 6–20)
CO2: 25 mmol/L (ref 22–32)
Calcium: 9.2 mg/dL (ref 8.9–10.3)
Chloride: 105 mmol/L (ref 98–111)
Creatinine, Ser: 0.79 mg/dL (ref 0.44–1.00)
GFR calc Af Amer: >60 mL/min (ref >60)
GFR calc non Af Amer: >60 mL/min (ref >60)
Glucose, Bld: 96 mg/dL (ref 70–99)
Potassium: 3.7 mmol/L (ref 3.5–5.1)
Sodium: 139 mmol/L (ref 135–145)
Total Bilirubin: 0.6 mg/dL (ref 0.3–1.2)
Total Protein: 6.9 g/dL (ref 6.5–8.1)

## 2019-12-20 LAB — CBC
HCT: 43.8 % (ref 36.0–46.0)
Hemoglobin: 13.7 g/dL (ref 12.0–15.0)
MCH: 29.5 pg (ref 26.0–34.0)
MCHC: 31.3 g/dL (ref 30.0–36.0)
MCV: 94.2 fL (ref 80.0–100.0)
Platelets: 303 10*3/uL (ref 150–400)
RBC: 4.65 MIL/uL (ref 3.87–5.11)
RDW: 14.3 % (ref 11.5–15.5)
WBC: 12.9 10*3/uL — ABNORMAL HIGH (ref 4.0–10.5)
nRBC: 0 % (ref 0.0–0.2)

## 2019-12-20 LAB — I-STAT BETA HCG BLOOD, ED (MC, WL, AP ONLY): I-stat hCG, quantitative: 7.8 m[IU]/mL — ABNORMAL HIGH (ref <5)

## 2019-12-20 LAB — LIPASE, BLOOD: Lipase: 32 U/L (ref 11–51)

## 2019-12-20 MED ORDER — SODIUM CHLORIDE 0.9% FLUSH: 3.0000 mL | Freq: Once | INTRAVENOUS | Status: DC

## 2019-12-20 NOTE — ED Triage Notes (Signed)
Pt reports abdominal pain for "a couple of months" states she has a lesion on her liver. Bilateral leg swelling about 1 hour PTA. Right knee pain for two weeks.

## 2019-12-20 NOTE — ED Notes (Signed)
Beta HCG 7.8

## 2019-12-20 NOTE — ED Triage Notes (Signed)
Per EMS report, pt has Right sided abdominal pain for 2 months, today 10/10. Bilateral swelling in ankles for 1.5 hours. Right knee pain. Also having chest discomfort while en route, EKG WNL. Pt says she felt like discomfort was due to her anxiety. VSS. 120/78, hr 102, 98% RA, 97.8 temp.

## 2019-12-21 ENCOUNTER — Emergency Department (HOSPITAL_COMMUNITY): Payer: Medicaid Other

## 2019-12-21 ENCOUNTER — Other Ambulatory Visit: Payer: Self-pay

## 2019-12-21 MED ORDER — KETOROLAC TROMETHAMINE 60 MG/2ML IM SOLN
60.0000 mg | Freq: Once | INTRAMUSCULAR | Status: AC
  Administered 2019-12-21: 60 mg via INTRAMUSCULAR
  Filled 2019-12-21: qty 2

## 2019-12-21 NOTE — Discharge Instructions (Addendum)
Follow up with primary doctor to schedule MRI of liver/ abdominal area as previously seen on CT scan.     Were compression stockings, avoid packaged/processed foods to minimize salt intake, increase exercise as tolerated.  Follow-up with primary care doctor to review your recent medical visits to different specialists and coordinate your care.-  Discussed physical therapy with your primary doctor and/or orthopedic/sports medicine.

## 2019-12-21 NOTE — ED Provider Notes (Signed)
[facility] EMERGENCY DEPARTMENT Provider Note   CSN: [id number] Arrival date & time: 12/20/19  2020     History Chief Complaint  Patient presents with  . Abdominal Pain    Diane Stein is a 34 y.o. female.  Patient with history of anxiety, depression, cigarette smoking presents with multiple different concerns.  Patient's had intermittent right abdominal pain for weeks no specific association.  Patient has had multiple CT scans for this and was told there is lesion on her liver.  Patient's had swelling bilateral lower extremities and has had ultrasound performed which showed no blood clots.  Patient also had right knee pain without injury recalled for the past few weeks.  Patient is seen gastroenterology, orthopedics, primary doctor, emergency medicine.  Patient feels her symptoms are not getting better she was told she needed an MRI of her liver and her knee.  Patient was told she needs physical therapy and possible surgery for her knee by [facility].        Past Medical History:  Diagnosis Date  . Anxiety   . Asthma    as a child  . Bronchitis    history  . Depression   . Dysrhythmia    rapid heartrate  . Pneumonia 2014   History  . Smoker    0.5 ppd x 10 yrs  . SVD (spontaneous vaginal delivery)    x 1  . Tachycardia 2016    Patient Active Problem List   Diagnosis Date Noted  . Post-op pneumonia 03/14/2018  . 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 Pelvic pain in female 08/28/2017  . Paratubal cyst 08/28/2017  . Chest pain at rest 12/26/2016  . Dizziness 12/26/2016  . Syncope and collapse 08/23/2016  . Depression   . Anxiety   . SVD (spontaneous vaginal delivery) 11/08/2014  . Irregular uterine contractions 11/07/2014  . Palpitations 05/12/2014  . Paroxysmal supraventricular tachycardia (HCC) 05/12/2014  . Pregnancy 05/12/2014    Past Surgical History:  Procedure  Laterality Date  . ABDOMINAL HYSTERECTOMY    . HYSTERECTOMY ABDOMINAL WITH SALPINGO-OOPHORECTOMY Bilateral 03/10/2018   Procedure: TOTAL ABDOMINAL HYSTERECTOMY WITH BILATERAL SALPINGO-OOPHORECTOMY;  Surgeon: Lavina Hamman, MD;  Location: [facility];  Service: Gynecology;  Laterality: Bilateral;  . LAPAROSCOPIC UNILATERAL SALPINGECTOMY Left 08/28/2017   Procedure: LAPAROSCOPIC UNILATERAL SALPINGECTOMY WITH REMOVAL OF PARA TUBAL CYST;  Surgeon: Lavina Hamman, MD;  Location: [facility];  Service: Gynecology;  Laterality: Left;  . LAPAROSCOPY Left 08/28/2017   Procedure: LAPAROSCOPY OPERATIVE, FULGERATION OF ENDOMETRIOSIS;  Surgeon: Lavina Hamman, MD;  Location: [facility];  Service: Gynecology;  Laterality: Left;  . OVARIAN CYST SURGERY    . TOOTH EXTRACTION Right 2008     OB History    Gravida  5   Para  1   Term  1   Preterm      AB  3   Living  1     SAB  3   TAB      Ectopic      Multiple  0   Live Births  1           Family History  Problem Relation Age of Onset  . Hypertension Mother   . Heart disease Father   . Heart disease Maternal Grandmother   . Hypertension Maternal Grandmother   . Heart disease Maternal Grandfather   . Hypertension Maternal Grandfather   . Heart disease Paternal Grandmother   . Hypertension Paternal Grandmother   .  Heart disease Paternal Grandfather   . Hypertension Paternal Grandfather     Social History   Tobacco Use  . Smoking status: Current Every Day Smoker    Packs/day: 0.50    Years: 10.00    Pack years: 5.00    Types: Cigarettes  . Smokeless tobacco: Never Used  Vaping Use  . Vaping Use: Never used  Substance Use Topics  . Alcohol use: Yes    Alcohol/week: 5.0 standard drinks    Types: 5 Glasses of wine per week  . Drug use: No    Home Medications Prior to Admission medications   Medication Sig Start Date End Date Taking? Authorizing Provider  albuterol (VENTOLIN HFA) 108 (90 Base) MCG/ACT inhaler Inhale 2 puffs into  the lungs every 6 (six) hours as needed for wheezing or shortness of breath.  09/13/18   [provider]  ALPRAZolam (XANAX XR) 1 MG 24 hr tablet Take 1 mg by mouth daily. Takes first thing in the morning    [provider]  alprazolam Prudy Feeler) 2 MG tablet Take 2 mg by mouth 2 (two) times daily. Takes 1st dose at 42 Noon and second dose a few hours later    [provider]  celecoxib (CELEBREX) 100 MG capsule Take 100 mg by mouth 2 (two) times daily as needed. 11/10/19   [provider]  dicyclomine (BENTYL) 20 MG tablet Take 1 tablet (20 mg total) by mouth 2 (two) times daily as needed for up to 4 days for spasms. 04/22/18 04/26/18  Little, Ambrose Finland, MD  doxepin (SINEQUAN) 50 MG capsule Take 50-100 mg by mouth at bedtime as needed (sleep).     [provider]  EPINEPHrine 0.3 mg/0.3 mL IJ SOAJ injection Inject 0.3 mLs (0.3 mg total) into the muscle once as needed for up to 1 dose (anaphalaxis). 01/09/18   Loren Racer, MD  estradiol (ESTRACE) 2 MG tablet Take 2 mg by mouth daily. 11/30/18   [provider]  famotidine (PEPCID) 20 MG tablet Take 1 tablet (20 mg total) by mouth 2 (two) times daily for 6 days. 12/29/18 01/04/19  Albrizze, Kaitlyn E, PA-C  FLUoxetine (PROZAC) 20 MG capsule Take 60 mg by mouth daily.    [provider]  furosemide (LASIX) 20 MG tablet Take 1 tablet (20 mg total) by mouth daily. 11/08/19   Benjiman Core, MD  HYDROcodone-acetaminophen (NORCO/VICODIN) 5-325 MG tablet Take 1 tablet by mouth every 4 (four) hours as needed. 12/06/19   [provider]  hydrOXYzine (VISTARIL) 50 MG capsule Take 1 capsule (50 mg total) by mouth 4 (four) times daily as needed for itching or vomiting. 12/29/18   Albrizze, Yvonna Alanis E, PA-C  naproxen (NAPROSYN) 500 MG tablet Take 1 tablet (500 mg total) by mouth 2 (two) times daily. 11/06/19   Renne Crigler, PA-C  ondansetron (ZOFRAN ODT) 4 MG disintegrating tablet Take 1 tablet (4  mg total) by mouth every 8 (eight) hours as needed for nausea or vomiting. 12/29/18   Albrizze, Caroleen Hamman, PA-C  oxyCODONE-acetaminophen (PERCOCET/ROXICET) 5-325 MG tablet Take 1 tablet by mouth every 6 (six) hours as needed for severe pain. 11/08/19   Benjiman Core, MD  pantoprazole (PROTONIX) 40 MG tablet Take 40 mg by mouth daily. 11/16/19   [provider]  traMADol (ULTRAM) 50 MG tablet Take 1 tablet (50 mg total) by mouth every 6 (six) hours as needed. 04/26/19   Geoffery Lyons, MD    Allergies    Avocado, Other, Benadryl [  diphenhydramine hcl], and Benadryl [diphenhydramine]  Review of Systems   Review of Systems  Constitutional: Negative for chills and fever.  HENT: Negative for congestion.   Eyes: Negative for visual disturbance.  Respiratory: Negative for shortness of breath.   Cardiovascular: Negative for chest pain.  Gastrointestinal: Positive for abdominal pain and nausea. Negative for vomiting.  Genitourinary: Negative for dysuria and flank pain.  Musculoskeletal: Positive for joint swelling. Negative for back pain, neck pain and neck stiffness.  Skin: Negative for rash.  Neurological: Negative for light-headedness and headaches.    Physical Exam Updated Vital Signs BP (!) 111/63   Pulse 93   Temp 97.9 F (36.6 C) (Oral)   Resp 15   LMP  (LMP Unknown) Comment: Cont. OCP  SpO2 96%   Physical Exam Vitals and nursing note reviewed.  Constitutional:      Appearance: She is well-developed.  HENT:     Head: Normocephalic and atraumatic.  Eyes:     General:        Right eye: No discharge.        Left eye: No discharge.     Conjunctiva/sclera: Conjunctivae normal.  Neck:     Trachea: No tracheal deviation.  Cardiovascular:     Rate and Rhythm: Normal rate and regular rhythm.  Pulmonary:     Effort: Pulmonary effort is normal.     Breath sounds: Normal breath sounds.  Abdominal:     General: There is no distension.     Palpations: Abdomen is soft.      Tenderness: There is abdominal tenderness (mild right mid abdomen). There is no guarding.  Musculoskeletal:     Cervical back: Normal range of motion and neck supple.     Comments: Patient has mild effusion, tenderness and ecchymosis medial aspect of right knee.  Patient has full range of motion flexion extension however has discomfort worse with flexion.  No tenderness to left knee.  Skin:    General: Skin is warm.     Comments: Patient has mild edema bilateral lower extremities minimal pitting.  Neurological:     Mental Status: She is alert and oriented to person, place, and time.     ED Results / Procedures / Treatments   Labs (all labs ordered are listed, but only abnormal results are displayed) Labs Reviewed  CBC - Abnormal; Notable for the following components:      Result Value   WBC 12.9 (*)    All other components within normal limits  URINALYSIS, ROUTINE W REFLEX MICROSCOPIC - Abnormal; Notable for the following components:   Color, Urine AMBER (*)    Specific Gravity, Urine 1.031 (*)    Bilirubin Urine SMALL (*)    Ketones, ur 5 (*)    All other components within normal limits  I-STAT BETA HCG BLOOD, ED ([facility], [facility], [facility] ONLY) - Abnormal; Notable for the following components:   I-stat hCG, quantitative 7.8 (*)    All other components within normal limits  LIPASE, BLOOD  COMPREHENSIVE METABOLIC PANEL    EKG None EKG reviewed showing heart rate 95, normal QT, no ST elevation or concerning T wave inversions. Radiology DG Chest Portable 1 View  Result Date: 12/21/2019 CLINICAL DATA:  Provided history: Shortness of breath. Additional history provided: Patient reports abdominal pain for "a couple of months, bilateral leg swelling, shortness of breath. EXAM: PORTABLE CHEST 1 VIEW COMPARISON:  CT abdomen/pelvis 12/16/2019, chest radiograph 12/16/2019 FINDINGS: Heart size within normal limits. There is no appreciable airspace  consolidation. No evidence of pleural effusion or  pneumothorax. No acute bony abnormality identified. IMPRESSION: No evidence of acute cardiopulmonary abnormality. Electronically Signed   By: Jackey Loge DO   On: 12/21/2019 08:16    Procedures Procedures (including critical care time)  Medications Ordered in ED Medications  sodium chloride flush (NS) 0.9 % injection 3 mL (3 mLs Intravenous Not Given 12/21/19 0814)  ketorolac (TORADOL) injection 60 mg (60 mg Intramuscular Given 12/21/19 2440)    ED Course  I have reviewed the triage vital signs and the nursing notes.  Pertinent labs & imaging results that were available during my care of the patient were reviewed by me and considered in my medical decision making (see chart for details).    MDM Rules/Calculators/A&P                          Patient presents with recurrent symptoms.  Reviewed medical records including care everywhere and [facility] in [place].  Patient's had multiple visits to the emergency department, specialists and primary care doctor.  The patient and I had a long discussion on what is new, how we can help her in the emergency department and what her follow-up plans are. Patient does request a referral to orthopedics for further evaluation and second opinion.  Reviewed medical records at [facility] and patient was diagnosed based on CT scan with avascular necrosis.  Patient says she does not have a specific follow-up for this but was told she needs physical therapy and possibly surgery.  Patient per her report was told she needs an MRI of her knee I discussed we do not perform joint MRIs in the ER and there would be no emergent indication today however she can discuss this with orthopedic and primary doctor.  Toradol shot given for pain. Patient's had persistent leg edema reviewed records showing 2 separate ultrasounds in the past month showing negative DVT.  Patient currently has no risk factors for DVT.  Discussed compression stockings, elevating the leg, more activity  etc. Discussed liver lesion that was seen on last CT scan and that she needs an MRI outpatient and monitoring to determine if she would need a biopsy. EKG reviewed and chest x-ray for mild shortness of breath that was transient she feels it was related to anxiety and it completely resolved.  Chest x-ray no acute findings EKG no ischemic findings.  Blood work reviewed showing normal lipase, normal liver function, minimally elevated white blood cell count 12.9, urinalysis showed mild elevated specific gravity.  Normal hemoglobin. Patient stable for continued outpatient follow-up.  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

## 2020-01-04 ENCOUNTER — Encounter (HOSPITAL_BASED_OUTPATIENT_CLINIC_OR_DEPARTMENT_OTHER): Payer: Self-pay | Admitting: Orthopedic Surgery

## 2020-01-04 ENCOUNTER — Other Ambulatory Visit: Payer: Self-pay

## 2020-01-06 ENCOUNTER — Other Ambulatory Visit (HOSPITAL_COMMUNITY)
Admission: RE | Admit: 2020-01-06 | Discharge: 2020-01-06 | Disposition: A | Discharge status: Home / Self Care | Payer: Medicaid Other | Source: Ambulatory Visit | Attending: Orthopedic Surgery | Admitting: Orthopedic Surgery

## 2020-01-06 DIAGNOSIS — Z20822 Contact with and (suspected) exposure to covid-19: Secondary | ICD-10-CM | POA: Insufficient documentation

## 2020-01-06 DIAGNOSIS — Z01812 Encounter for preprocedural laboratory examination: Secondary | ICD-10-CM | POA: Insufficient documentation

## 2020-01-06 LAB — SARS CORONAVIRUS 2 (TAT 6-24 HRS): SARS Coronavirus 2: NEGATIVE

## 2020-01-06 NOTE — H&P (Signed)
33 yof presents with b/l knee pain. She has been seen in a few emergency rooms. A CT scan was done in the [facility] system as well as X-rays which did demonstrate some AVN at her right knee primarily at the lateral weight bearing condyle, some in the posterior of both medial and lateral condyles. She did not have any imaging of the left, the left has been painful though.    Past medical history: No contributory.   Surgical history: None  Allergies: Benadryl   Medications: None  Social history: Smoker Denies: Vaping, EtOH, IDU  Family history: Non-contributory   Objective  BP 111/63 Pulse 93  Gen: Well appearing, NAD Resp: CTA B/L  Card: RRR, No MRG Abd: Soft, NT,ND Ext: B/L knee pain She has tenderness at the anterior knee and bilateral joint lines.  Limited range of motion and pain acton the right knee.  Neuro: NVI, Cranial nerves grossly intact  Imaging: MRI shows b/l Knee AVN  A/P will plan for Right knee arthroscopy with chondroplasty This was d/w pt. Risks, benefits, alternatives  were discussed. Pt. Understands and has decided to proceed.

## 2020-01-09 ENCOUNTER — Encounter (HOSPITAL_BASED_OUTPATIENT_CLINIC_OR_DEPARTMENT_OTHER)
Admission: RE | Admit: 2020-01-09 | Discharge: 2020-01-09 | Disposition: A | Discharge status: Home / Self Care | Payer: Medicaid Other | Source: Ambulatory Visit | Attending: Orthopedic Surgery | Admitting: Orthopedic Surgery

## 2020-01-09 DIAGNOSIS — M879 Osteonecrosis, unspecified: Secondary | ICD-10-CM | POA: Diagnosis present

## 2020-01-09 DIAGNOSIS — F17200 Nicotine dependence, unspecified, uncomplicated: Secondary | ICD-10-CM | POA: Diagnosis not present

## 2020-01-09 DIAGNOSIS — Z888 Allergy status to other drugs, medicaments and biological substances status: Secondary | ICD-10-CM | POA: Diagnosis not present

## 2020-01-09 DIAGNOSIS — J45909 Unspecified asthma, uncomplicated: Secondary | ICD-10-CM | POA: Diagnosis not present

## 2020-01-09 LAB — BASIC METABOLIC PANEL
Anion gap: 12 (ref 5–15)
BUN: 8 mg/dL (ref 6–20)
CO2: 22 mmol/L (ref 22–32)
Calcium: 9.5 mg/dL (ref 8.9–10.3)
Chloride: 105 mmol/L (ref 98–111)
Creatinine, Ser: 0.76 mg/dL (ref 0.44–1.00)
GFR calc Af Amer: >60 mL/min (ref >60)
GFR calc non Af Amer: >60 mL/min (ref >60)
Glucose, Bld: 99 mg/dL (ref 70–99)
Potassium: 4.6 mmol/L (ref 3.5–5.1)
Sodium: 139 mmol/L (ref 135–145)

## 2020-01-09 LAB — CBC
HCT: 46.6 % — ABNORMAL HIGH (ref 36.0–46.0)
Hemoglobin: 15 g/dL (ref 12.0–15.0)
MCH: 29.4 pg (ref 26.0–34.0)
MCHC: 32.2 g/dL (ref 30.0–36.0)
MCV: 91.2 fL (ref 80.0–100.0)
Platelets: 305 10*3/uL (ref 150–400)
RBC: 5.11 MIL/uL (ref 3.87–5.11)
RDW: 13.8 % (ref 11.5–15.5)
WBC: 12.4 10*3/uL — ABNORMAL HIGH (ref 4.0–10.5)
nRBC: 0 % (ref 0.0–0.2)

## 2020-01-09 NOTE — Progress Notes (Signed)

## 2020-01-10 ENCOUNTER — Ambulatory Visit (HOSPITAL_BASED_OUTPATIENT_CLINIC_OR_DEPARTMENT_OTHER)
Admission: RE | Admit: 2020-01-10 | Discharge: 2020-01-10 | Disposition: A | Discharge status: Home / Self Care | Payer: Medicaid Other | Attending: Orthopedic Surgery | Admitting: Orthopedic Surgery

## 2020-01-10 ENCOUNTER — Encounter (HOSPITAL_BASED_OUTPATIENT_CLINIC_OR_DEPARTMENT_OTHER)
Admission: RE | Disposition: A | Discharge status: Home / Self Care | Payer: Self-pay | Source: Home / Self Care | Attending: Orthopedic Surgery

## 2020-01-10 ENCOUNTER — Ambulatory Visit (HOSPITAL_BASED_OUTPATIENT_CLINIC_OR_DEPARTMENT_OTHER): Payer: Medicaid Other | Admitting: Certified Registered Nurse Anesthetist

## 2020-01-10 ENCOUNTER — Other Ambulatory Visit: Payer: Self-pay

## 2020-01-10 ENCOUNTER — Encounter (HOSPITAL_BASED_OUTPATIENT_CLINIC_OR_DEPARTMENT_OTHER): Payer: Self-pay | Admitting: Orthopedic Surgery

## 2020-01-10 DIAGNOSIS — J45909 Unspecified asthma, uncomplicated: Secondary | ICD-10-CM | POA: Insufficient documentation

## 2020-01-10 DIAGNOSIS — M879 Osteonecrosis, unspecified: Secondary | ICD-10-CM | POA: Diagnosis not present

## 2020-01-10 DIAGNOSIS — Z888 Allergy status to other drugs, medicaments and biological substances status: Secondary | ICD-10-CM | POA: Insufficient documentation

## 2020-01-10 DIAGNOSIS — F17200 Nicotine dependence, unspecified, uncomplicated: Secondary | ICD-10-CM | POA: Insufficient documentation

## 2020-01-10 HISTORY — PX: KNEE ARTHROSCOPY: SHX127

## 2020-01-10 HISTORY — PX: KNEE ARTHROSCOPY WITH SUBCHONDROPLASTY: SHX6732

## 2020-01-10 HISTORY — PX: BONE MARROW BIOPSY: SHX1253

## 2020-01-10 LAB — POCT PREGNANCY, URINE: Preg Test, Ur: NEGATIVE

## 2020-01-10 SURGERY — ARTHROSCOPY, KNEE
Anesthesia: Regional | Site: Knee | Laterality: Right

## 2020-01-10 MED ORDER — FENTANYL CITRATE (PF) 100 MCG/2ML IJ SOLN
INTRAMUSCULAR | Status: AC
  Filled 2020-01-10: qty 2

## 2020-01-10 MED ORDER — DEXAMETHASONE SODIUM PHOSPHATE 10 MG/ML IJ SOLN
INTRAMUSCULAR | Status: AC
  Filled 2020-01-10: qty 1

## 2020-01-10 MED ORDER — ACETAMINOPHEN 10 MG/ML IV SOLN
1000.0000 mg | Freq: Once | INTRAVENOUS | Status: AC
  Administered 2020-01-10: 1000 mg via INTRAVENOUS

## 2020-01-10 MED ORDER — CEFAZOLIN SODIUM-DEXTROSE 2-4 GM/100ML-% IV SOLN
INTRAVENOUS | Status: AC
  Filled 2020-01-10: qty 100

## 2020-01-10 MED ORDER — SODIUM CHLORIDE (PF) 0.9 % IJ SOLN
INTRAMUSCULAR | Status: AC
  Filled 2020-01-10: qty 10

## 2020-01-10 MED ORDER — SODIUM CHLORIDE (PF) 0.9 % IJ SOLN
INTRAMUSCULAR | Status: DC | PRN
  Administered 2020-01-10: 9 mL

## 2020-01-10 MED ORDER — FENTANYL CITRATE (PF) 100 MCG/2ML IJ SOLN
INTRAMUSCULAR | Status: DC | PRN
  Administered 2020-01-10: 100 ug via INTRAVENOUS

## 2020-01-10 MED ORDER — GLYCOPYRROLATE 0.2 MG/ML IJ SOLN
INTRAMUSCULAR | Status: DC | PRN
  Administered 2020-01-10: .2 mg via INTRAVENOUS

## 2020-01-10 MED ORDER — BUPIVACAINE HCL (PF) 0.5 % IJ SOLN
INTRAMUSCULAR | Status: AC
  Filled 2020-01-10: qty 30

## 2020-01-10 MED ORDER — DEXMEDETOMIDINE (PRECEDEX) IN NS 20 MCG/5ML (4 MCG/ML) IV SYRINGE
PREFILLED_SYRINGE | INTRAVENOUS | Status: AC
  Filled 2020-01-10: qty 5

## 2020-01-10 MED ORDER — ROPIVACAINE HCL 7.5 MG/ML IJ SOLN
INTRAMUSCULAR | Status: DC | PRN
  Administered 2020-01-10 (×4): 5 mL via PERINEURAL

## 2020-01-10 MED ORDER — MIDAZOLAM HCL 2 MG/2ML IJ SOLN
2.0000 mg | Freq: Once | INTRAMUSCULAR | Status: AC
  Administered 2020-01-10: 2 mg via INTRAVENOUS

## 2020-01-10 MED ORDER — LACTATED RINGERS IV SOLN: INTRAVENOUS | Status: DC

## 2020-01-10 MED ORDER — LIDOCAINE HCL (CARDIAC) PF 100 MG/5ML IV SOSY
PREFILLED_SYRINGE | INTRAVENOUS | Status: DC | PRN
  Administered 2020-01-10: 100 mg via INTRAVENOUS

## 2020-01-10 MED ORDER — CEFAZOLIN SODIUM-DEXTROSE 2-4 GM/100ML-% IV SOLN
2.0000 g | INTRAVENOUS | Status: AC
  Administered 2020-01-10: 2 g via INTRAVENOUS

## 2020-01-10 MED ORDER — LACTATED RINGERS IV BOLUS: 500.0000 mL | Freq: Once | INTRAVENOUS | Status: DC

## 2020-01-10 MED ORDER — ACETAMINOPHEN 10 MG/ML IV SOLN
INTRAVENOUS | Status: AC
  Filled 2020-01-10: qty 100

## 2020-01-10 MED ORDER — DEXMEDETOMIDINE (PRECEDEX) IN NS 20 MCG/5ML (4 MCG/ML) IV SYRINGE
PREFILLED_SYRINGE | INTRAVENOUS | Status: DC | PRN
  Administered 2020-01-10 (×10): 8 ug via INTRAVENOUS

## 2020-01-10 MED ORDER — MIDAZOLAM HCL 2 MG/2ML IJ SOLN
INTRAMUSCULAR | Status: AC
  Filled 2020-01-10: qty 2

## 2020-01-10 MED ORDER — GLYCOPYRROLATE 0.2 MG/ML IJ SOLN
INTRAMUSCULAR | Status: AC
  Filled 2020-01-10: qty 3

## 2020-01-10 MED ORDER — SODIUM CHLORIDE 0.9 % IR SOLN
Status: DC | PRN
  Administered 2020-01-10: 2000 mL

## 2020-01-10 MED ORDER — ONDANSETRON HCL 4 MG/2ML IJ SOLN: 4.0000 mg | Freq: Once | INTRAMUSCULAR | Status: DC | PRN

## 2020-01-10 MED ORDER — ONDANSETRON HCL 4 MG/2ML IJ SOLN
INTRAMUSCULAR | Status: AC
  Filled 2020-01-10: qty 2

## 2020-01-10 MED ORDER — FENTANYL CITRATE (PF) 100 MCG/2ML IJ SOLN
100.0000 ug | Freq: Once | INTRAMUSCULAR | Status: AC
  Administered 2020-01-10: 100 ug via INTRAVENOUS

## 2020-01-10 MED ORDER — ASPIRIN EC 81 MG PO TBEC
81.0000 mg | DELAYED_RELEASE_TABLET | Freq: Two times a day (BID) | ORAL | 0 refills | Status: AC
Start: 2020-01-10 — End: 2020-02-09

## 2020-01-10 MED ORDER — PROPOFOL 10 MG/ML IV BOLUS
INTRAVENOUS | Status: AC
  Filled 2020-01-10: qty 20

## 2020-01-10 MED ORDER — HEPARIN SODIUM (PORCINE) 1000 UNIT/ML IJ SOLN
INTRAMUSCULAR | Status: AC
  Filled 2020-01-10: qty 1

## 2020-01-10 MED ORDER — DEXAMETHASONE SODIUM PHOSPHATE 10 MG/ML IJ SOLN
INTRAMUSCULAR | Status: DC | PRN
  Administered 2020-01-10: 5 mg via INTRAVENOUS

## 2020-01-10 MED ORDER — MIDAZOLAM HCL 2 MG/2ML IJ SOLN
INTRAMUSCULAR | Status: DC | PRN
  Administered 2020-01-10 (×2): 1 mg via INTRAVENOUS

## 2020-01-10 MED ORDER — HYDROMORPHONE HCL 2 MG PO TABS: 2.0000 mg | ORAL_TABLET | Freq: Four times a day (QID) | ORAL | 0 refills | Status: AC | PRN

## 2020-01-10 MED ORDER — FENTANYL CITRATE (PF) 100 MCG/2ML IJ SOLN
25.0000 ug | INTRAMUSCULAR | Status: DC | PRN
  Administered 2020-01-10 (×3): 50 ug via INTRAVENOUS

## 2020-01-10 MED ORDER — OXYCODONE HCL 5 MG PO TABS
ORAL_TABLET | ORAL | Status: AC
  Filled 2020-01-10: qty 1

## 2020-01-10 MED ORDER — METHYLPREDNISOLONE ACETATE 80 MG/ML IJ SUSP
INTRAMUSCULAR | Status: AC
  Filled 2020-01-10: qty 1

## 2020-01-10 MED ORDER — ARTIFICIAL TEARS OPHTHALMIC OINT
TOPICAL_OINTMENT | OPHTHALMIC | Status: DC | PRN
  Administered 2020-01-10: 1 via OPHTHALMIC

## 2020-01-10 MED ORDER — PROPOFOL 10 MG/ML IV BOLUS
INTRAVENOUS | Status: DC | PRN
  Administered 2020-01-10: 200 mg via INTRAVENOUS

## 2020-01-10 MED ORDER — BUPIVACAINE HCL (PF) 0.25 % IJ SOLN
INTRAMUSCULAR | Status: AC
  Filled 2020-01-10: qty 30

## 2020-01-10 MED ORDER — CLONIDINE HCL (ANALGESIA) 100 MCG/ML EP SOLN
EPIDURAL | Status: DC | PRN
  Administered 2020-01-10: 100 ug

## 2020-01-10 MED ORDER — HEPARIN SODIUM (PORCINE) 5000 UNIT/ML IJ SOLN
INTRAMUSCULAR | Status: DC | PRN
  Administered 2020-01-10: 5000 [IU]

## 2020-01-10 MED ORDER — HEPARIN SODIUM (PORCINE) 5000 UNIT/ML IJ SOLN
INTRAMUSCULAR | Status: AC
  Filled 2020-01-10: qty 1

## 2020-01-10 MED ORDER — DEXMEDETOMIDINE (PRECEDEX) IN NS 20 MCG/5ML (4 MCG/ML) IV SYRINGE
PREFILLED_SYRINGE | INTRAVENOUS | Status: AC
  Filled 2020-01-10: qty 15

## 2020-01-10 MED ORDER — KETOROLAC TROMETHAMINE 30 MG/ML IJ SOLN: 30.0000 mg | Freq: Once | INTRAMUSCULAR | Status: DC | PRN

## 2020-01-10 MED ORDER — ONDANSETRON HCL 4 MG/2ML IJ SOLN
INTRAMUSCULAR | Status: DC | PRN
  Administered 2020-01-10: 4 mg via INTRAVENOUS

## 2020-01-10 MED ORDER — LIDOCAINE 2% (20 MG/ML) 5 ML SYRINGE
INTRAMUSCULAR | Status: AC
  Filled 2020-01-10: qty 5

## 2020-01-10 MED ORDER — ROPIVACAINE HCL 5 MG/ML IJ SOLN
INTRAMUSCULAR | Status: DC | PRN
  Administered 2020-01-10 (×2): 5 mL via PERINEURAL

## 2020-01-10 MED ORDER — OXYCODONE HCL 5 MG PO TABS
5.0000 mg | ORAL_TABLET | Freq: Once | ORAL | Status: AC
  Administered 2020-01-10: 5 mg via ORAL

## 2020-01-10 MED ORDER — ARTIFICIAL TEARS OPHTHALMIC OINT
TOPICAL_OINTMENT | OPHTHALMIC | Status: AC
  Filled 2020-01-10: qty 3.5

## 2020-01-10 MED ORDER — MEPERIDINE HCL 25 MG/ML IJ SOLN: 6.2500 mg | INTRAMUSCULAR | Status: DC | PRN

## 2020-01-10 SURGICAL SUPPLY — 61 items
APL PRP STRL LF DISP 70% ISPRP (MISCELLANEOUS) ×6
BLADE SURG 15 STRL LF DISP TIS (BLADE) IMPLANT
BLADE SURG 15 STRL SS (BLADE) ×8
BNDG ELASTIC 6X5.8 VLCR STR LF (GAUZE/BANDAGES/DRESSINGS) ×4 IMPLANT
CHLORAPREP W/TINT 26 (MISCELLANEOUS) ×5 IMPLANT
CLSR STERI-STRIP ANTIMIC 1/2X4 (GAUZE/BANDAGES/DRESSINGS) ×1 IMPLANT
COVER BACK TABLE 80X110 HD (DRAPES) ×1 IMPLANT
COVER WAND RF STERILE (DRAPES) IMPLANT
CUFF TOURN SGL QUICK 34 (TOURNIQUET CUFF)
CUFF TRNQT CYL 34X4.125X (TOURNIQUET CUFF) ×1 IMPLANT
DISSECTOR  3.8MM X 13CM (MISCELLANEOUS) ×4
DISSECTOR 3.8MM X 13CM (MISCELLANEOUS) ×3 IMPLANT
DISSECTOR 4.0MM X 13CM (MISCELLANEOUS) IMPLANT
DRAPE ARTHROSCOPY W/POUCH 90 (DRAPES) ×4 IMPLANT
DRAPE IMP U-DRAPE 54X76 (DRAPES) ×4 IMPLANT
DRAPE INCISE IOBAN 66X45 STRL (DRAPES) ×2 IMPLANT
DRAPE OEC MINIVIEW 54X84 (DRAPES) ×1 IMPLANT
DRAPE U-SHAPE 47X51 STRL (DRAPES) ×3 IMPLANT
DRSG EMULSION OIL 3X3 NADH (GAUZE/BANDAGES/DRESSINGS) ×4 IMPLANT
ELECT REM PT RETURN 9FT ADLT (ELECTROSURGICAL) ×4
ELECTRODE REM PT RTRN 9FT ADLT (ELECTROSURGICAL) ×3 IMPLANT
EXCALIBUR 3.8MM X 13CM (MISCELLANEOUS) IMPLANT
GAUZE SPONGE 4X4 12PLY STRL (GAUZE/BANDAGES/DRESSINGS) ×4 IMPLANT
GLOVE BIO SURGEON STRL SZ7.5 (GLOVE) ×12 IMPLANT
GLOVE BIOGEL PI IND STRL 6.5 (GLOVE) IMPLANT
GLOVE BIOGEL PI IND STRL 7.5 (GLOVE) ×3 IMPLANT
GLOVE BIOGEL PI IND STRL 8 (GLOVE) ×6 IMPLANT
GLOVE BIOGEL PI IND STRL 8.5 (GLOVE) IMPLANT
GLOVE BIOGEL PI INDICATOR 6.5 (GLOVE) ×3
GLOVE BIOGEL PI INDICATOR 7.5 (GLOVE) ×1
GLOVE BIOGEL PI INDICATOR 8 (GLOVE) ×2
GLOVE BIOGEL PI INDICATOR 8.5 (GLOVE)
GLOVE ECLIPSE 6.5 STRL STRAW (GLOVE) ×3 IMPLANT
GOWN STRL REUS W/ TWL LRG LVL3 (GOWN DISPOSABLE) ×7 IMPLANT
GOWN STRL REUS W/ TWL XL LVL3 (GOWN DISPOSABLE) ×3 IMPLANT
GOWN STRL REUS W/TWL LRG LVL3 (GOWN DISPOSABLE) ×8
GOWN STRL REUS W/TWL XL LVL3 (GOWN DISPOSABLE) ×8
IV NS IRRIG 3000ML ARTHROMATIC (IV SOLUTION) ×4 IMPLANT
KIT BONE MRW ASP ANGEL CPRP (KITS) ×4 IMPLANT
KIT IOBP KNEE OPEN TIP (KITS) ×2 IMPLANT
KNEE WRAP E Z 3 GEL PACK (MISCELLANEOUS) ×4 IMPLANT
MANIFOLD NEPTUNE II (INSTRUMENTS) ×4 IMPLANT
NS IRRIG 1000ML POUR BTL (IV SOLUTION) ×4 IMPLANT
PACK ARTHROSCOPY DSU (CUSTOM PROCEDURE TRAY) ×4 IMPLANT
PACK BASIN DAY SURGERY FS (CUSTOM PROCEDURE TRAY) ×4 IMPLANT
PENCIL SMOKE EVAC W/HOLSTER (ELECTROSURGICAL) ×1 IMPLANT
PORT APPOLLO RF 90DEGREE MULTI (SURGICAL WAND) IMPLANT
PUTTY DBM ALLOSYNC PURE 10CC (Putty) ×1 IMPLANT
SLEEVE SCD COMPRESS KNEE MED (MISCELLANEOUS) ×1 IMPLANT
SPONGE LAP 4X18 RFD (DISPOSABLE) ×4 IMPLANT
SUCTION FRAZIER HANDLE 10FR (MISCELLANEOUS) ×4
SUCTION TUBE FRAZIER 10FR DISP (MISCELLANEOUS) ×3 IMPLANT
SUT ETHILON 3 0 PS 1 (SUTURE) ×4 IMPLANT
SYR 20ML LL LF (SYRINGE) ×1 IMPLANT
SYR 3ML 23GX1 SAFETY (SYRINGE) ×1 IMPLANT
SYR 5ML LL (SYRINGE) ×3 IMPLANT
SYR BULB EAR ULCER 2OZ BL STRL (SYRINGE) ×2 IMPLANT
TAPE CLOTH 3X10 TAN LF (GAUZE/BANDAGES/DRESSINGS) IMPLANT
TOWEL GREEN STERILE FF (TOWEL DISPOSABLE) ×5 IMPLANT
TUBING ARTHROSCOPY IRRIG 16FT (MISCELLANEOUS) ×4 IMPLANT
WATER STERILE IRR 1000ML POUR (IV SOLUTION) ×1 IMPLANT

## 2020-01-10 NOTE — Anesthesia Preprocedure Evaluation (Signed)
Anesthesia Evaluation  Patient identified by MRN, date of birth, ID band Patient awake    Reviewed: Allergy & Precautions, NPO status , Patient's Chart, lab work & pertinent test results  Airway Mallampati: I       Dental no notable dental hx. (+) Teeth Intact   Pulmonary asthma , Current Smoker and Patient abstained from smoking.,    Pulmonary exam normal breath sounds clear to auscultation       Cardiovascular Normal cardiovascular exam Rhythm:Regular Rate:Normal     Neuro/Psych PSYCHIATRIC DISORDERS Anxiety Depression negative neurological ROS     GI/Hepatic Neg liver ROS, GERD  Medicated,  Endo/Other  negative endocrine ROS  Renal/GU negative Renal ROS  negative genitourinary   Musculoskeletal   Abdominal (+) + obese,   Peds  Hematology negative hematology ROS (+)   Anesthesia Other Findings   Reproductive/Obstetrics                             Anesthesia Physical Anesthesia Plan  ASA: II  Anesthesia Plan: General   Post-op Pain Management:  Regional for Post-op pain   Induction:   PONV Risk Score and Plan: 2 and Ondansetron, Dexamethasone and Midazolam  Airway Management Planned: LMA  Additional Equipment: None  Intra-op Plan:   Post-operative Plan: Extubation in OR  Informed Consent: I have reviewed the patients History and Physical, chart, labs and discussed the procedure including the risks, benefits and alternatives for the proposed anesthesia with the patient or authorized representative who has indicated his/her understanding and acceptance.     Dental advisory given  Plan Discussed with: CRNA  Anesthesia Plan Comments:         Anesthesia Quick Evaluation

## 2020-01-10 NOTE — Anesthesia Procedure Notes (Signed)
Anesthesia Regional Block: Adductor canal block   Pre-Anesthetic Checklist: ,, timeout performed, Correct Patient, Correct Site, Correct Laterality, Correct Procedure, Correct Position, site marked, Risks and benefits discussed,  Surgical consent,  Pre-op evaluation,  At surgeon's request and post-op pain management  Laterality: Lower and Right  Prep: chloraprep       Needles:  Injection technique: Single-shot  Needle Type: Echogenic Stimulator Needle     Needle Length: 9cm  Needle Gauge: 20   Needle insertion depth: 3 cm   Additional Needles:   Procedures:,,,, ultrasound used (permanent image in chart),,,,  Narrative:  Start time: 01/10/2020 8:10 AM End time: 01/10/2020 8:20 AM Injection made incrementally with aspirations every 5 mL.  Performed by: Personally  Anesthesiologist: Leilani Able, MD

## 2020-01-10 NOTE — Progress Notes (Signed)
Assisted Dr. Hatchett with right, ultrasound guided, adductor canal block. Side rails up, monitors on throughout procedure. See vital signs in flow sheet. Tolerated Procedure well.  

## 2020-01-10 NOTE — Transfer of Care (Signed)
Immediate Anesthesia Transfer of Care Note  Patient: Diane Stein  Procedure(s) Performed: ARTHROSCOPY KNEE (Right Knee) KNEE ARTHROSCOPY WITH SUBCHONDROPLASTY (Knee) BONE MARROW ASPIRATION (Right Hip)  Patient Location: PACU  Anesthesia Type:General and Regional  Level of Consciousness: awake, alert  and patient cooperative  Airway & Oxygen Therapy: Patient Spontanous Breathing and Patient connected to face mask oxygen  Post-op Assessment: Report given to RN, Post -op Vital signs reviewed and stable, Patient moving all extremities X 4 and Patient able to stick tongue midline  Post vital signs: Reviewed and stable  Last Vitals:  Vitals Value Taken Time  BP 92/51 01/10/20 1136  Temp    Pulse 83 01/10/20 1140  Resp 13 01/10/20 1140  SpO2 99 % 01/10/20 1140  Vitals shown include unvalidated device data.  Last Pain:  Vitals:   01/10/20 0746  TempSrc: Oral  PainSc: 8       Patients Stated Pain Goal: 5 (01/10/20 0746)  Complications: No complications documented.

## 2020-01-10 NOTE — Interval H&P Note (Signed)
History and Physical Interval Note:  01/10/2020 8:02 AM  Diane Stein  has presented today for surgery, with the diagnosis of RIGHT KNEE AVASCULAR NECROSIS.  The various methods of treatment have been discussed with the patient and family. After consideration of risks, benefits and other options for treatment, the patient has consented to  Procedure(s): ARTHROSCOPY KNEE (Right) as a surgical intervention.  The patient's history has been reviewed, patient examined, no change in status, stable for surgery.  I have reviewed the patient's chart and labs.  Questions were answered to the patient's satisfaction.     Sheral Apley

## 2020-01-10 NOTE — Anesthesia Postprocedure Evaluation (Signed)
Anesthesia Post Note  Patient: Diane Stein  Procedure(s) Performed: DIAGNOSTIC ARTHROSCOPY KNEE (Right Knee) KNEE ARTHROSCOPY WITH SUBCHONDROPLASTY (Knee) BONE MARROW ASPIRATION (Right Hip)     Patient location during evaluation: PACU Anesthesia Type: General Level of consciousness: awake Pain management: pain level controlled Vital Signs Assessment: post-procedure vital signs reviewed and stable Respiratory status: spontaneous breathing Cardiovascular status: stable Postop Assessment: no apparent nausea or vomiting Anesthetic complications: no   No complications documented.  Last Vitals:  Vitals:   01/10/20 1245 01/10/20 1311  BP: (!) 98/52 (!) 105/58  Pulse: 78 80  Resp: 19 18  Temp:  36.5 C  SpO2: 95% 94%    Last Pain:  Vitals:   01/10/20 1305  TempSrc:   PainSc: 6                  John F Scharlene Corn

## 2020-01-10 NOTE — Anesthesia Procedure Notes (Signed)
Procedure Name: LMA Insertion Date/Time: 01/10/2020 10:09 AM Performed by: Salomon Mast, CRNA Pre-anesthesia Checklist: Patient identified, Emergency Drugs available, Suction available and Patient being monitored Patient Re-evaluated:Patient Re-evaluated prior to induction Oxygen Delivery Method: Circle system utilized Preoxygenation: Pre-oxygenation with 100% oxygen Induction Type: IV induction Ventilation: Mask ventilation with difficulty and Nasal airway inserted- appropriate to patient size LMA: LMA inserted LMA Size: 4.0 Number of attempts: 1 Placement Confirmation: positive ETCO2 and breath sounds checked- equal and bilateral Tube secured with: Tape Dental Injury: Teeth and Oropharynx as per pre-operative assessment

## 2020-01-10 NOTE — Discharge Instructions (Signed)
Keep leg elevated with ice to reduce pain and swelling. You may increase pain medication up to 2 tablets every 4 hours for the first few days post op if needed.  Stop as needed pain medication as soon as you are able.  Diet: As you were doing prior to hospitalization   Dressing:  You may remove dressings and shower over incisions 3 days after surgery.  Place clean Band-Aid over incisions.  Continue to use ace wrap for compression to reduce swelling.   Activity:  Increase activity slowly as tolerated, but follow the weight bearing instructions below.  No driving for 6 weeks.  Weight Bearing: No Weight Bearing to Right Leg until after follow up.   Post Anesthesia Home Care Instructions  Activity: Get plenty of rest for the remainder of the day. A responsible individual must stay with you for 24 hours following the procedure.  For the next 24 hours, DO NOT: -Drive a car -Advertising copywriter -Drink alcoholic beverages -Take any medication unless instructed by your physician -Make any legal decisions or sign important papers.  Meals: Start with liquid foods such as gelatin or soup. Progress to regular foods as tolerated. Avoid greasy, spicy, heavy foods. If nausea and/or vomiting occur, drink only clear liquids until the nausea and/or vomiting subsides. Call your physician if vomiting continues.  Special Instructions/Symptoms: Your throat may feel dry or sore from the anesthesia or the breathing tube placed in your throat during surgery. If this causes discomfort, gargle with warm salt water. The discomfort should disappear within 24 hours.  If you had a scopolamine patch placed behind your ear for the management of post- operative nausea and/or vomiting:  1. The medication in the patch is effective for 72 hours, after which it should be removed.  Wrap patch in a tissue and discard in the trash. Wash hands thoroughly with soap and water. 2. You may remove the patch earlier than 72 hours if  you experience unpleasant side effects which may include dry mouth, dizziness or visual disturbances. 3. Avoid touching the patch. Wash your hands with soap and water after contact with the patch.    Regional Anesthesia Blocks  1. Numbness or the inability to move the "blocked" extremity may last from 3-48 hours after placement. The length of time depends on the medication injected and your individual response to the medication. If the numbness is not going away after 48 hours, call your surgeon.  2. The extremity that is blocked will need to be protected until the numbness is gone and the  Strength has returned. Because you cannot feel it, you will need to take extra care to avoid injury. Because it may be weak, you may have difficulty moving it or using it. You may not know what position it is in without looking at it while the block is in effect.  3. For blocks in the legs and feet, returning to weight bearing and walking needs to be done carefully. You will need to wait until the numbness is entirely gone and the strength has returned. You should be able to move your leg and foot normally before you try and bear weight or walk. You will need someone to be with you when you first try to ensure you do not fall and possibly risk injury.  4. Bruising and tenderness at the needle site are common side effects and will resolve in a few days.  5. Persistent numbness or new problems with movement should be communicated to the  surgeon or the [facility] [phone] [facility] [phone]). To prevent constipation: you may use a stool softener such as -  Colace (over the counter) 100 mg by mouth twice a day  Drink plenty of fluids (prune juice may be helpful) and high fiber foods Miralax (over the counter) for constipation as needed.    Itching:  If you experience itching with your medications, try taking only a single pain pill, or even half a pain pill at a time.  You  can also use benadryl over the counter for itching or also to help with sleep.   Precautions:  If you experience chest pain or shortness of breath - call 911 immediately for transfer to the hospital emergency department!!  If you develop a fever greater that 101 F, purulent drainage from wound, increased redness or drainage from wound, or calf pain -- Call the office at [phone]                                                 Follow- Up Appointment:  Please call for an appointment to be seen in 2 weeks [place] - [phone]

## 2020-01-12 ENCOUNTER — Encounter (HOSPITAL_BASED_OUTPATIENT_CLINIC_OR_DEPARTMENT_OTHER): Payer: Self-pay | Admitting: Orthopedic Surgery

## 2020-01-17 NOTE — Op Note (Signed)
01/10/2020  6:41 AM  PATIENT:  Diane Stein    PRE-OPERATIVE DIAGNOSIS:  RIGHT KNEE AVASCULAR NECROSIS  POST-OPERATIVE DIAGNOSIS:  Same  PROCEDURE:  DIAGNOSTIC ARTHROSCOPY KNEE, KNEE ARTHROSCOPY WITH SUBCHONDROPLASTY, BONE MARROW ASPIRATION  SURGEON:  Sheral Apley, MD  ASSISTANT: Daun Peacock, PA-C, he was present and scrubbed throughout the case, critical for completion in a timely fashion, and for retraction, instrumentation, and closure.   ANESTHESIA:   General  BLOOD LOSS: min  COMPLICATIONS: None   PREOPERATIVE INDICATIONS:  Diane Stein is a  34 y.o. female with a diagnosis of RIGHT KNEE AVASCULAR NECROSIS who failed conservative measures and elected for surgical management.    The risks benefits and alternatives were discussed with the patient preoperatively including but not limited to the risks of infection, bleeding, nerve injury, cardiopulmonary complications, the need for revision surgery, among others, and the patient was willing to proceed.  OPERATIVE IMPLANTS: biologic bone graft to med and lat fem chondyles  OPERATIVE FINDINGS: Examination under anesthesia: stable Diagnostic Arthroscopy:  articular cartilage:stable Medial meniscus:stable Lateral meniscus:stable Anterior cruciate ligament/PCL: intact Loose bodies: none    OPERATIVE PROCEDURE:  Patient was identified in the preoperative holding area and site was marked by me female was transported to the operating theater and placed on the table in supine position taking care to pad all bony prominences. After a preincinduction time out anesthesia was induced.  female received ancef for preoperative antibiotics. The right lower extremity was prepped and draped in normal sterile fashion and a pre-incision timeout was performed.   The illiac crest was draped out and prepped prior to placing the extremity drape. This was then covered with an ioban.  I made a small incision over the crest. I  placed a percutaneous canula into the iliac crest posterior to the asis and was able to harvest marrow and bone graft. This was then taken to the back table  I irrigated and closed this incision.  Centrifuge was used to spin this graft down and combined with allograft bone graft.   A small stab incision was made in the anterolateral portal position. The arthroscope was introduced in the joint. A medial portal was then established under direct visualization just above the anterior horn of the medial meniscus. Diagnostic arthroscopy was then carried out with findings as described above.  Cartilage over the lesions was intact   I then used 4+ views of fluoroscopy to place a percutaneous canula into the lateral fem chondyle. I injected bioplasty material into the insuficciency lesions aiding in fixation here of these impending fracture sites of osteochondral bone.   I then repeated this for the lesion in the medial femoral chondyle.   I reinserted the arthroscope into the knee and confirmed no articular penetration.   The arthroscopic equipment was removed from the joint and the portals were closed with 3-0 nylon in an interrupted fashion. The knee was infiltrated with marcaine.  Sterile dressings were then applied including Xeroform 4 x 4's ABDs an ACE bandage.  The patient was then allowed to awaken from general anesthesia, transferred to the stretcher and taken to the recovery room in stable condition.  POSTOPERATIVE PLAN: The patient will be discharged home today and will followup in one week for suture removal and wound check.  VTE prophylaxis: mobilize and ASA

## 2020-02-06 IMAGING — CR LUMBAR SPINE - COMPLETE 4+ VIEW
5 series · 5 of 5 positions shown · non-contrast
Comparison: None.

CLINICAL DATA: Pain after fall

EXAM:
LUMBAR SPINE - COMPLETE 4+ VIEW

[t l-spine a.p.]
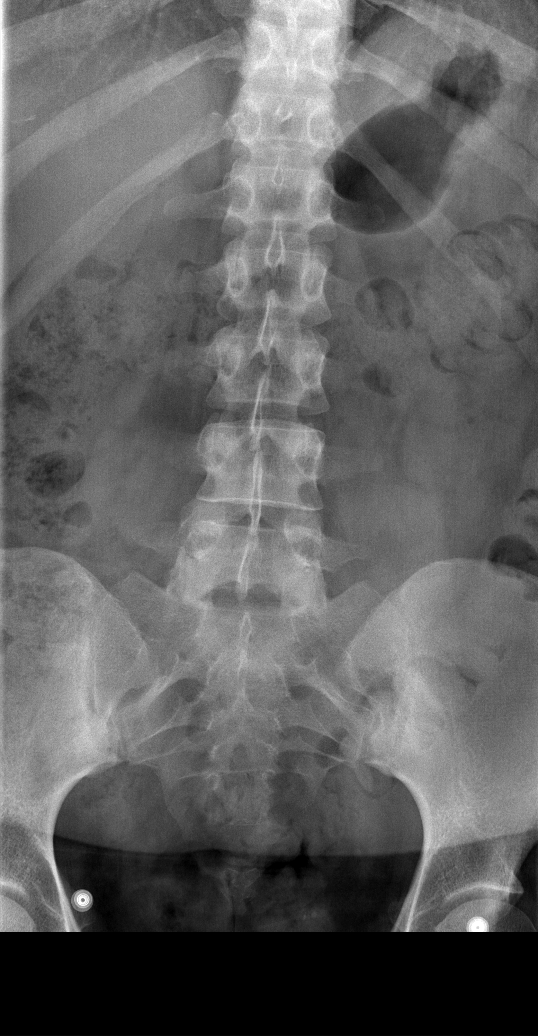

[t l-spine oblique exposure (1 of 2)]
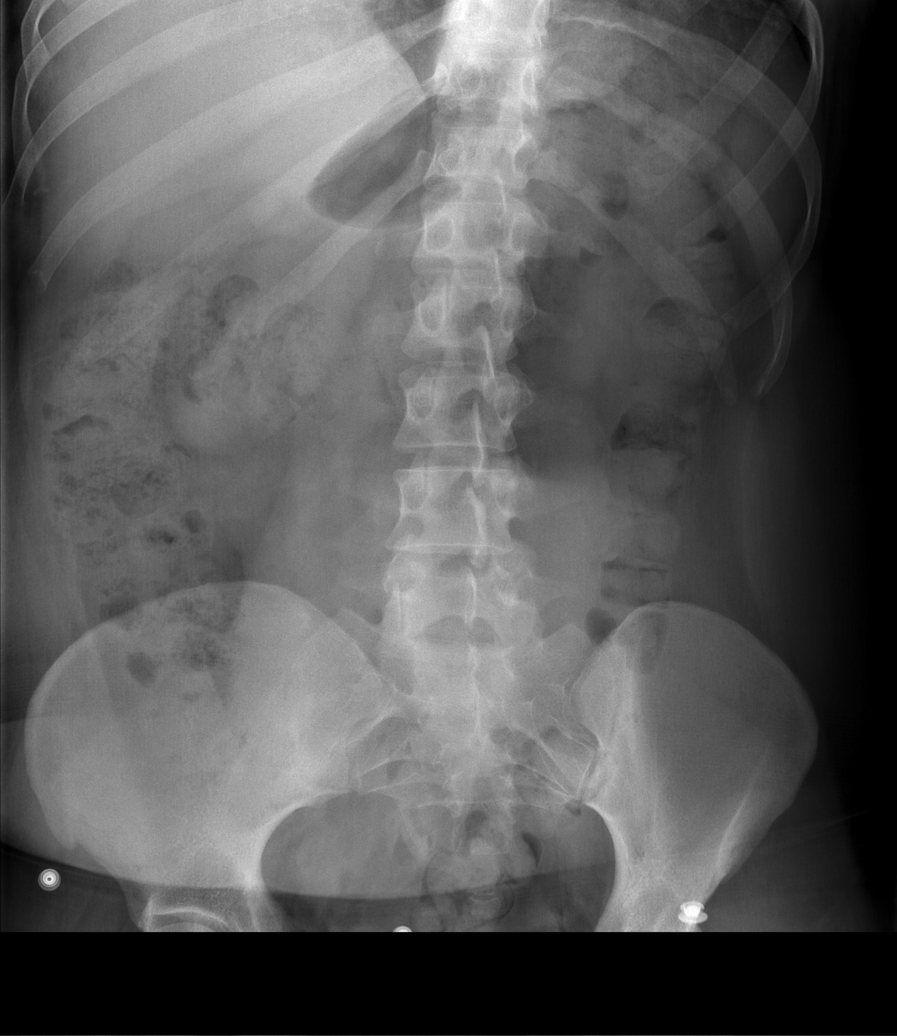

[t l-spine oblique exposure (2 of 2)]
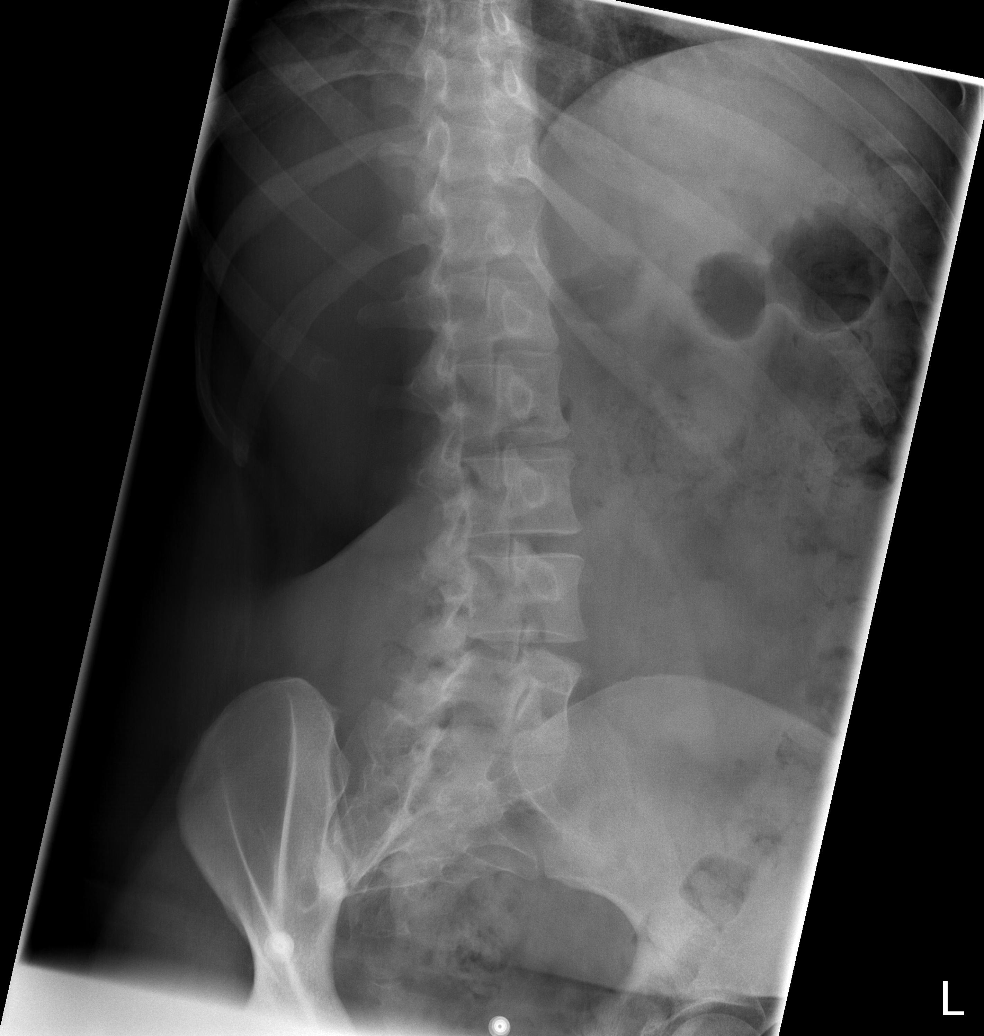

[t l-spine lat]
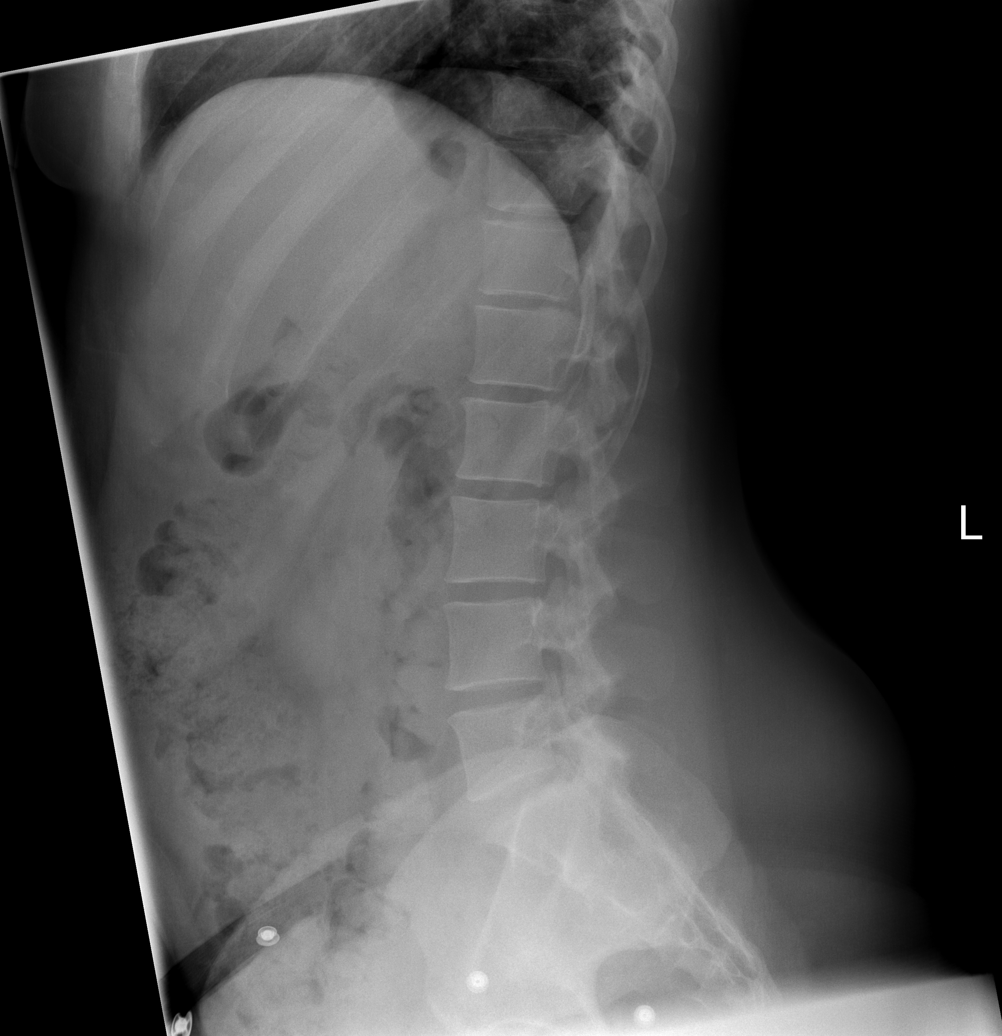

[t l-spine l5-s1 spot]
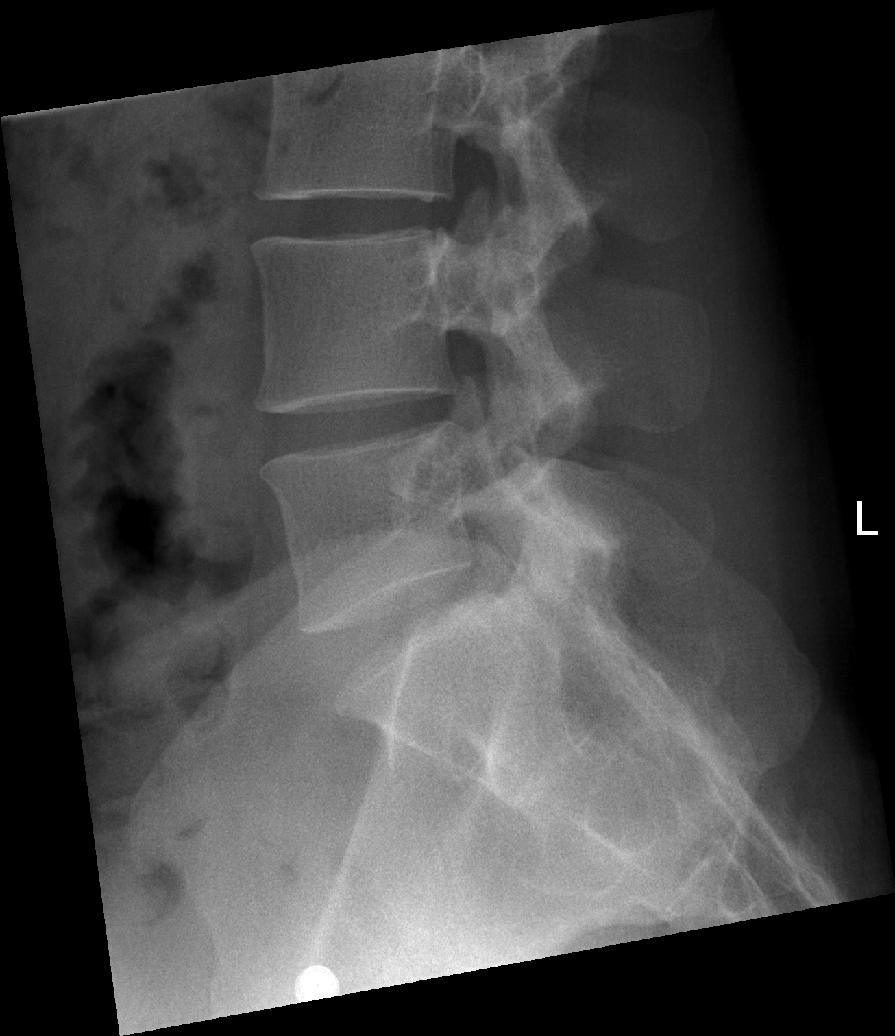

[5 of 5 positions shown; findings below may reference images not displayed]

FINDINGS: There is no evidence of lumbar spine fracture. Alignment is normal.
Intervertebral disc spaces are maintained.
IMPRESSION: Negative.

## 2020-04-03 ENCOUNTER — Other Ambulatory Visit: Payer: Self-pay

## 2020-04-03 ENCOUNTER — Encounter (HOSPITAL_BASED_OUTPATIENT_CLINIC_OR_DEPARTMENT_OTHER): Payer: Self-pay | Admitting: Orthopedic Surgery

## 2020-04-03 NOTE — Progress Notes (Addendum)
Spoke w/ via phone for pre-op interview---pt Lab needs dos----  none             Lab results------chest xray 12-21-2019 epic COVID test ------04-06-2020 900 Arrive at -------930 am 04-10-2020 NPO after MN NO Solid Food.  Clear liquids from MN until---830 am then npo Medications to take morning of surgery -----alprazolam, bring albuterol inhaler, fluoxetine Diabetic medication -----n/a Patient Special Instructions -----none Pre-Op special Istructions -----none Patient verbalized understanding of instructions that were given at this phone interview. Patient denies shortness of breath, chest pain, fever, cough at this phone interview.

## 2020-04-03 NOTE — H&P (Signed)
HPI:  4 yof with history of b/l AVN of the femoral condyles presents with b/l knee pain. She had this same procedure done on the right knee with good results. She would like to proceed with this on the left knee now.  Past Medical History:  Diagnosis Date  . Anxiety   . Asthma    well controlled  . Avascular necrosis (HCC)    left knee  . Bronchitis    history  . Depression   . Dysrhythmia    rapid heart rate, released by cardiology 2016  . Pneumonia 2014, 2019   History  . Smoker    0.5 ppd x 10 yrs  . SVD (spontaneous vaginal delivery)    x 1  . Tachycardia 2016     Past Surgical History:  Procedure Laterality Date  . ABDOMINAL HYSTERECTOMY    . BONE MARROW BIOPSY Right 01/10/2020   Procedure: BONE MARROW ASPIRATION;  Surgeon: Sheral Apley, MD;  Location: [facility];  Service: Orthopedics;  Laterality: Right;  . HYSTERECTOMY ABDOMINAL WITH SALPINGO-OOPHORECTOMY Bilateral 03/10/2018   Procedure: TOTAL ABDOMINAL HYSTERECTOMY WITH BILATERAL SALPINGO-OOPHORECTOMY;  Surgeon: Lavina Hamman, MD;  Location: [facility];  Service: Gynecology;  Laterality: Bilateral;  . KNEE ARTHROSCOPY Right 01/10/2020   Procedure: DIAGNOSTIC ARTHROSCOPY KNEE;  Surgeon: Sheral Apley, MD;  Location: [facility];  Service: Orthopedics;  Laterality: Right;  . KNEE ARTHROSCOPY WITH SUBCHONDROPLASTY  01/10/2020   Procedure: KNEE ARTHROSCOPY WITH SUBCHONDROPLASTY;  Surgeon: Sheral Apley, MD;  Location: [facility];  Service: Orthopedics;;  . LAPAROSCOPIC UNILATERAL SALPINGECTOMY Left 08/28/2017   Procedure: LAPAROSCOPIC UNILATERAL SALPINGECTOMY WITH REMOVAL OF PARA TUBAL CYST;  Surgeon: Lavina Hamman, MD;  Location: [facility];  Service: Gynecology;  Laterality: Left;  . LAPAROSCOPY Left 08/28/2017   Procedure: LAPAROSCOPY OPERATIVE, FULGERATION OF ENDOMETRIOSIS;  Surgeon: Lavina Hamman, MD;  Location: [facility];  Service: Gynecology;  Laterality: Left;  . TOOTH  EXTRACTION Right 2008     Family History  Problem Relation Age of Onset  . Hypertension Mother   . Heart disease Father   . Heart disease Maternal Grandmother   . Hypertension Maternal Grandmother   . Heart disease Maternal Grandfather   . Hypertension Maternal Grandfather   . Heart disease Paternal Grandmother   . Hypertension Paternal Grandmother   . Heart disease Paternal Grandfather   . Hypertension Paternal Grandfather      Current Medications No current facility-administered medications for this encounter.   Marland Kitchen acetaminophen (TYLENOL) 500 MG tablet  . albuterol (VENTOLIN HFA) 108 (90 Base) MCG/ACT inhaler  . alprazolam (XANAX) 2 MG tablet  . doxepin (SINEQUAN) 50 MG capsule  . EPINEPHrine 0.3 mg/0.3 mL IJ SOAJ injection  . FLUoxetine (PROZAC) 20 MG capsule  . levocetirizine (XYZAL) 5 MG tablet  . melatonin 3 MG TABS tablet     Allergies Avocado, Other, and Benadryl [diphenhydramine]  Social History  reports that she has been smoking cigarettes. She has a 2.50 pack-year smoking history. She has never used smokeless tobacco. She reports current alcohol use of about 5.0 standard drinks of alcohol per week. She reports that she does not use drugs.    Physical Exam General: Well appearing, in NAD Mental status: Alert and Oriented x3 Lungs: CTA b/l anterior and posterior without crackles or wheeze Heart: RRR, no m/g/r appreciated Abdomen: +BS, soft, nt, nd, no masses, hernias, or organomegaly appreciated Neurological: Speech Clear and organized, no gross focal findings or movement disorder appreciated  Musculoskeletal: no gross joint deformity or swelling.  Observed gait normal Extremities: Left knee joint is TTP Other: Warm and well perfused w/o edema Skin: Warm and dry    Assessment/Plan She had Right knee arthroplasty with subchondroplasty with bone graft form iliac crest with very good results. Now she would like to proceed with the same on the right.   Risks/benifts/alternatives were discussed at length and she wishes to proceed.

## 2020-04-06 ENCOUNTER — Other Ambulatory Visit (HOSPITAL_COMMUNITY)
Admission: RE | Admit: 2020-04-06 | Discharge: 2020-04-06 | Disposition: A | Discharge status: Home / Self Care | Payer: Medicaid Other | Source: Ambulatory Visit | Attending: Orthopedic Surgery | Admitting: Orthopedic Surgery

## 2020-04-06 DIAGNOSIS — Z01812 Encounter for preprocedural laboratory examination: Secondary | ICD-10-CM | POA: Diagnosis present

## 2020-04-06 DIAGNOSIS — Z20822 Contact with and (suspected) exposure to covid-19: Secondary | ICD-10-CM | POA: Insufficient documentation

## 2020-04-06 LAB — SARS CORONAVIRUS 2 (TAT 6-24 HRS): SARS Coronavirus 2: NEGATIVE

## 2020-04-10 ENCOUNTER — Ambulatory Visit (HOSPITAL_BASED_OUTPATIENT_CLINIC_OR_DEPARTMENT_OTHER): Payer: Medicaid Other | Admitting: Anesthesiology

## 2020-04-10 ENCOUNTER — Encounter (HOSPITAL_BASED_OUTPATIENT_CLINIC_OR_DEPARTMENT_OTHER): Payer: Self-pay | Admitting: Orthopedic Surgery

## 2020-04-10 ENCOUNTER — Ambulatory Visit (HOSPITAL_BASED_OUTPATIENT_CLINIC_OR_DEPARTMENT_OTHER)
Admission: RE | Admit: 2020-04-10 | Discharge: 2020-04-10 | Disposition: A | Discharge status: Home / Self Care | Payer: Medicaid Other | Attending: Orthopedic Surgery | Admitting: Orthopedic Surgery

## 2020-04-10 ENCOUNTER — Other Ambulatory Visit: Payer: Self-pay

## 2020-04-10 ENCOUNTER — Encounter (HOSPITAL_BASED_OUTPATIENT_CLINIC_OR_DEPARTMENT_OTHER)
Admission: RE | Disposition: A | Discharge status: Home / Self Care | Payer: Self-pay | Source: Home / Self Care | Attending: Orthopedic Surgery

## 2020-04-10 DIAGNOSIS — F17200 Nicotine dependence, unspecified, uncomplicated: Secondary | ICD-10-CM | POA: Insufficient documentation

## 2020-04-10 DIAGNOSIS — M879 Osteonecrosis, unspecified: Secondary | ICD-10-CM | POA: Insufficient documentation

## 2020-04-10 DIAGNOSIS — Z888 Allergy status to other drugs, medicaments and biological substances status: Secondary | ICD-10-CM | POA: Diagnosis not present

## 2020-04-10 HISTORY — DX: Idiopathic aseptic necrosis of unspecified bone: M87.00

## 2020-04-10 HISTORY — PX: KNEE ARTHROSCOPY WITH SUBCHONDROPLASTY: SHX6732

## 2020-04-10 SURGERY — ARTHROSCOPY, KNEE, WITH SUBCHONDROPLASTY
Anesthesia: Regional | Site: Knee | Laterality: Left

## 2020-04-10 MED ORDER — CEFAZOLIN SODIUM-DEXTROSE 2-4 GM/100ML-% IV SOLN
INTRAVENOUS | Status: AC
  Filled 2020-04-10: qty 100

## 2020-04-10 MED ORDER — ACETAMINOPHEN 160 MG/5ML PO SOLN: 1000.0000 mg | Freq: Once | ORAL | Status: DC | PRN

## 2020-04-10 MED ORDER — ONDANSETRON HCL 4 MG PO TABS: 4.0000 mg | ORAL_TABLET | Freq: Four times a day (QID) | ORAL | Status: DC | PRN

## 2020-04-10 MED ORDER — OXYCODONE HCL 5 MG PO TABS: 10.0000 mg | ORAL_TABLET | ORAL | Status: DC | PRN

## 2020-04-10 MED ORDER — CELECOXIB 200 MG PO CAPS: 200.0000 mg | ORAL_CAPSULE | Freq: Two times a day (BID) | ORAL | 0 refills | Status: AC

## 2020-04-10 MED ORDER — PROPOFOL 10 MG/ML IV BOLUS
INTRAVENOUS | Status: DC | PRN
  Administered 2020-04-10: 200 mg via INTRAVENOUS

## 2020-04-10 MED ORDER — FENTANYL CITRATE (PF) 100 MCG/2ML IJ SOLN
INTRAMUSCULAR | Status: AC
  Filled 2020-04-10: qty 2

## 2020-04-10 MED ORDER — OXYCODONE HCL 5 MG PO TABS
5.0000 mg | ORAL_TABLET | Freq: Once | ORAL | Status: AC | PRN
  Administered 2020-04-10: 5 mg via ORAL

## 2020-04-10 MED ORDER — ACETAMINOPHEN 325 MG PO TABS: 325.0000 mg | ORAL_TABLET | Freq: Four times a day (QID) | ORAL | Status: DC | PRN

## 2020-04-10 MED ORDER — FENTANYL CITRATE (PF) 100 MCG/2ML IJ SOLN
INTRAMUSCULAR | Status: DC | PRN
  Administered 2020-04-10: 25 ug via INTRAVENOUS
  Administered 2020-04-10: 50 ug via INTRAVENOUS
  Administered 2020-04-10: 25 ug via INTRAVENOUS
  Administered 2020-04-10 (×2): 50 ug via INTRAVENOUS

## 2020-04-10 MED ORDER — ONDANSETRON HCL 4 MG/2ML IJ SOLN
INTRAMUSCULAR | Status: DC | PRN
  Administered 2020-04-10: 4 mg via INTRAVENOUS

## 2020-04-10 MED ORDER — CEFAZOLIN SODIUM-DEXTROSE 2-4 GM/100ML-% IV SOLN
2.0000 g | INTRAVENOUS | Status: AC
  Administered 2020-04-10: 2 g via INTRAVENOUS

## 2020-04-10 MED ORDER — LACTATED RINGERS IV SOLN: INTRAVENOUS | Status: DC

## 2020-04-10 MED ORDER — FENTANYL CITRATE (PF) 100 MCG/2ML IJ SOLN
100.0000 ug | Freq: Once | INTRAMUSCULAR | Status: AC
  Administered 2020-04-10: 100 ug via INTRAVENOUS

## 2020-04-10 MED ORDER — SENNOSIDES-DOCUSATE SODIUM 8.6-50 MG PO TABS
1.0000 | ORAL_TABLET | Freq: Every evening | ORAL | Status: DC | PRN
  Filled 2020-04-10: qty 1

## 2020-04-10 MED ORDER — OXYCODONE HCL 5 MG/5ML PO SOLN: 5.0000 mg | Freq: Once | ORAL | Status: AC | PRN

## 2020-04-10 MED ORDER — PROPOFOL 10 MG/ML IV BOLUS
INTRAVENOUS | Status: AC
  Filled 2020-04-10: qty 20

## 2020-04-10 MED ORDER — HYDROMORPHONE HCL 1 MG/ML IJ SOLN: 0.5000 mg | INTRAMUSCULAR | Status: DC | PRN

## 2020-04-10 MED ORDER — MIDAZOLAM HCL 2 MG/2ML IJ SOLN
2.0000 mg | Freq: Once | INTRAMUSCULAR | Status: AC
  Administered 2020-04-10: 2 mg via INTRAVENOUS

## 2020-04-10 MED ORDER — HYDROMORPHONE HCL 2 MG PO TABS: 2.0000 mg | ORAL_TABLET | ORAL | 0 refills | Status: AC | PRN

## 2020-04-10 MED ORDER — METHOCARBAMOL 500 MG PO TABS: 500.0000 mg | ORAL_TABLET | Freq: Three times a day (TID) | ORAL | 0 refills | Status: AC | PRN

## 2020-04-10 MED ORDER — OXYCODONE HCL 5 MG PO TABS: 5.0000 mg | ORAL_TABLET | ORAL | Status: DC | PRN

## 2020-04-10 MED ORDER — SODIUM CHLORIDE (PF) 0.9 % IJ SOLN
INTRAMUSCULAR | Status: DC | PRN
  Administered 2020-04-10: 10 mL

## 2020-04-10 MED ORDER — HEPARIN SOD (PORK) LOCK FLUSH 100 UNIT/ML IV SOLN
INTRAVENOUS | Status: DC | PRN
  Administered 2020-04-10: 500 [IU] via INTRAVENOUS

## 2020-04-10 MED ORDER — SODIUM CHLORIDE 0.9 % IR SOLN
Status: DC | PRN
  Administered 2020-04-10: 6000 mL

## 2020-04-10 MED ORDER — DEXAMETHASONE SODIUM PHOSPHATE 10 MG/ML IJ SOLN
INTRAMUSCULAR | Status: AC
  Filled 2020-04-10: qty 1

## 2020-04-10 MED ORDER — ONDANSETRON HCL 4 MG/2ML IJ SOLN: 4.0000 mg | Freq: Four times a day (QID) | INTRAMUSCULAR | Status: DC | PRN

## 2020-04-10 MED ORDER — DOCUSATE SODIUM 100 MG PO CAPS: 100.0000 mg | ORAL_CAPSULE | Freq: Two times a day (BID) | ORAL | Status: DC

## 2020-04-10 MED ORDER — METOCLOPRAMIDE HCL 5 MG PO TABS
5.0000 mg | ORAL_TABLET | Freq: Three times a day (TID) | ORAL | Status: DC | PRN
  Filled 2020-04-10: qty 2

## 2020-04-10 MED ORDER — ACETAMINOPHEN 500 MG PO TABS
ORAL_TABLET | ORAL | Status: AC
  Filled 2020-04-10: qty 2

## 2020-04-10 MED ORDER — METOCLOPRAMIDE HCL 5 MG/ML IJ SOLN: 5.0000 mg | Freq: Three times a day (TID) | INTRAMUSCULAR | Status: DC | PRN

## 2020-04-10 MED ORDER — NAPROXEN 250 MG PO TABS
250.0000 mg | ORAL_TABLET | Freq: Two times a day (BID) | ORAL | Status: DC
  Filled 2020-04-10: qty 1

## 2020-04-10 MED ORDER — ACETAMINOPHEN 500 MG PO TABS: 1000.0000 mg | ORAL_TABLET | Freq: Once | ORAL | Status: DC | PRN

## 2020-04-10 MED ORDER — LIDOCAINE 2% (20 MG/ML) 5 ML SYRINGE
INTRAMUSCULAR | Status: AC
  Filled 2020-04-10: qty 5

## 2020-04-10 MED ORDER — ACETAMINOPHEN 500 MG PO TABS
1000.0000 mg | ORAL_TABLET | Freq: Once | ORAL | Status: AC
  Administered 2020-04-10: 1000 mg via ORAL

## 2020-04-10 MED ORDER — LIDOCAINE 2% (20 MG/ML) 5 ML SYRINGE
INTRAMUSCULAR | Status: DC | PRN
  Administered 2020-04-10: 80 mg via INTRAVENOUS

## 2020-04-10 MED ORDER — ACETAMINOPHEN 10 MG/ML IV SOLN: 1000.0000 mg | Freq: Once | INTRAVENOUS | Status: DC | PRN

## 2020-04-10 MED ORDER — MIDAZOLAM HCL 2 MG/2ML IJ SOLN
INTRAMUSCULAR | Status: AC
  Filled 2020-04-10: qty 2

## 2020-04-10 MED ORDER — MIDAZOLAM HCL 5 MG/5ML IJ SOLN
INTRAMUSCULAR | Status: DC | PRN
  Administered 2020-04-10 (×2): 1 mg via INTRAVENOUS

## 2020-04-10 MED ORDER — ASPIRIN EC 81 MG PO TBEC: 81.0000 mg | DELAYED_RELEASE_TABLET | Freq: Two times a day (BID) | ORAL | 0 refills | Status: AC

## 2020-04-10 MED ORDER — FENTANYL CITRATE (PF) 100 MCG/2ML IJ SOLN
25.0000 ug | INTRAMUSCULAR | Status: DC | PRN
  Administered 2020-04-10 (×3): 50 ug via INTRAVENOUS

## 2020-04-10 MED ORDER — OXYCODONE HCL 5 MG PO TABS
ORAL_TABLET | ORAL | Status: AC
  Filled 2020-04-10: qty 1

## 2020-04-10 MED ORDER — BUPIVACAINE-EPINEPHRINE (PF) 0.5% -1:200000 IJ SOLN
INTRAMUSCULAR | Status: DC | PRN
  Administered 2020-04-10: 25 mL via PERINEURAL

## 2020-04-10 MED ORDER — CEFAZOLIN SODIUM-DEXTROSE 2-4 GM/100ML-% IV SOLN: 2.0000 g | Freq: Four times a day (QID) | INTRAVENOUS | Status: DC

## 2020-04-10 MED ORDER — SODIUM CHLORIDE (PF) 0.9 % IJ SOLN
INTRAMUSCULAR | Status: AC
  Filled 2020-04-10: qty 10

## 2020-04-10 MED ORDER — BISACODYL 10 MG RE SUPP: 10.0000 mg | Freq: Every day | RECTAL | Status: DC | PRN

## 2020-04-10 MED ORDER — ONDANSETRON HCL 4 MG/2ML IJ SOLN
INTRAMUSCULAR | Status: AC
  Filled 2020-04-10: qty 2

## 2020-04-10 SURGICAL SUPPLY — 51 items
APL PRP STRL LF DISP 70% ISPRP (MISCELLANEOUS) ×1
BNDG CMPR MED 15X6 ELC VLCR LF (GAUZE/BANDAGES/DRESSINGS) ×1
BNDG ELASTIC 6X15 VLCR STRL LF (GAUZE/BANDAGES/DRESSINGS) ×3 IMPLANT
BNDG ELASTIC 6X5.8 VLCR STR LF (GAUZE/BANDAGES/DRESSINGS) ×3 IMPLANT
CHLORAPREP W/TINT 26 (MISCELLANEOUS) ×3 IMPLANT
CLOSURE WOUND 1/2 X4 (GAUZE/BANDAGES/DRESSINGS) ×2
COVER WAND RF STERILE (DRAPES) ×3 IMPLANT
CUFF TOURN SGL QUICK 34 (TOURNIQUET CUFF) ×3
CUFF TRNQT CYL 34X4.125X (TOURNIQUET CUFF) ×1 IMPLANT
DISSECTOR  3.8MM X 13CM (MISCELLANEOUS) ×3
DISSECTOR 3.8MM X 13CM (MISCELLANEOUS) ×1 IMPLANT
DRAPE ARTHROSCOPY W/POUCH 114 (DRAPES) ×3 IMPLANT
DRAPE IMP U-DRAPE 54X76 (DRAPES) ×3 IMPLANT
DRAPE INCISE IOBAN 66X45 STRL (DRAPES) ×2 IMPLANT
DRAPE OEC MINIVIEW 54X84 (DRAPES) ×2 IMPLANT
DRAPE U-SHAPE 47X51 STRL (DRAPES) ×3 IMPLANT
DRAPE UTILITY 15X26 TOWEL STRL (DRAPES) ×2 IMPLANT
DRSG EMULSION OIL 3X3 NADH (GAUZE/BANDAGES/DRESSINGS) ×3 IMPLANT
DRSG OPSITE POSTOP 3X4 (GAUZE/BANDAGES/DRESSINGS) ×4 IMPLANT
DRSG PAD ABDOMINAL 8X10 ST (GAUZE/BANDAGES/DRESSINGS) ×2 IMPLANT
DRSG TEGADERM 4X4.75 (GAUZE/BANDAGES/DRESSINGS) ×2 IMPLANT
EXCALIBUR 3.8MM X 13CM (MISCELLANEOUS) IMPLANT
GAUZE SPONGE 4X4 12PLY STRL (GAUZE/BANDAGES/DRESSINGS) ×3 IMPLANT
GLOVE BIO SURGEON STRL SZ7.5 (GLOVE) ×8 IMPLANT
GLOVE BIOGEL PI IND STRL 7.5 (GLOVE) IMPLANT
GLOVE BIOGEL PI IND STRL 8 (GLOVE) ×2 IMPLANT
GLOVE BIOGEL PI INDICATOR 7.5 (GLOVE) ×4
GLOVE BIOGEL PI INDICATOR 8 (GLOVE) ×4
GOWN STRL REUS W/TWL LRG LVL3 (GOWN DISPOSABLE) ×12 IMPLANT
IMMOBILIZER KNEE 22 UNIV (SOFTGOODS) ×2 IMPLANT
IV NS 500ML (IV SOLUTION) ×3
IV NS 500ML BAXH (IV SOLUTION) IMPLANT
IV NS IRRIG 3000ML ARTHROMATIC (IV SOLUTION) ×6 IMPLANT
KIT BONE MRW ASP ANGEL CPRP (KITS) ×2 IMPLANT
KIT IOBP KNEE OPEN TIP (KITS) ×2 IMPLANT
KIT TURNOVER CYSTO (KITS) ×3 IMPLANT
MANIFOLD NEPTUNE II (INSTRUMENTS) ×3 IMPLANT
PACK ARTHROSCOPY DSU (CUSTOM PROCEDURE TRAY) ×3 IMPLANT
PACK BASIN DAY SURGERY FS (CUSTOM PROCEDURE TRAY) ×3 IMPLANT
PADDING CAST COTTON 6X4 STRL (CAST SUPPLIES) ×2 IMPLANT
PORT APPOLLO RF 90DEGREE MULTI (SURGICAL WAND) IMPLANT
PUTTY DBM ALLOSYNC PURE 2.5CC (Putty) ×2 IMPLANT
PUTTY DBM ALLOSYNC PURE 5CC (Putty) ×2 IMPLANT
STRIP CLOSURE SKIN 1/2X4 (GAUZE/BANDAGES/DRESSINGS) ×2 IMPLANT
SUT ETHILON 3 0 PS 1 (SUTURE) ×3 IMPLANT
SUT MNCRL AB 3-0 PS2 27 (SUTURE) ×3 IMPLANT
TAPE CLOTH 3X10 TAN LF (GAUZE/BANDAGES/DRESSINGS) IMPLANT
TOWEL OR 17X26 10 PK STRL BLUE (TOWEL DISPOSABLE) ×3 IMPLANT
TUBING ARTHROSCOPY IRRIG 16FT (MISCELLANEOUS) ×3 IMPLANT
WATER STERILE IRR 1000ML POUR (IV SOLUTION) ×1 IMPLANT
WRAP KNEE MAXI GEL POST OP (GAUZE/BANDAGES/DRESSINGS) ×2 IMPLANT

## 2020-04-10 NOTE — Anesthesia Postprocedure Evaluation (Signed)
Anesthesia Post Note  Patient: Domingo Sep  Procedure(s) Performed: KNEE ARTHROSCOPY WITH SUBCHONDROPLASTY, HARVEST OF BONE GRAFT FROM ILIAC CREST (Left Knee)     Patient location during evaluation: PACU Anesthesia Type: General Level of consciousness: awake and alert Pain management: pain level controlled Vital Signs Assessment: post-procedure vital signs reviewed and stable Respiratory status: spontaneous breathing, nonlabored ventilation, respiratory function stable and patient connected to nasal cannula oxygen Cardiovascular status: blood pressure returned to baseline and stable Postop Assessment: no apparent nausea or vomiting Anesthetic complications: no   No complications documented.  Last Vitals:  Vitals:   04/10/20 1245 04/10/20 1340  BP: 120/68 125/75  Pulse: 84 89  Resp: 16 16  Temp:  (!) 36.4 C  SpO2: 93% 95%    Last Pain:  Vitals:   04/10/20 1340  TempSrc:   PainSc: 4                  Maxen Rowland S

## 2020-04-10 NOTE — Anesthesia Procedure Notes (Signed)
Anesthesia Regional Block: Adductor canal block   Pre-Anesthetic Checklist: ,, timeout performed, Correct Patient, Correct Site, Correct Laterality, Correct Procedure, Correct Position, site marked, Risks and benefits discussed,  Surgical consent,  Pre-op evaluation,  At surgeon's request and post-op pain management  Laterality: Left and Lower  Prep: chloraprep       Needles:  Injection technique: Single-shot     Needle Length: 9cm  Needle Gauge: 22     Additional Needles: Arrow StimuQuik ECHO Echogenic Stimulating PNB Needle  Procedures:,,,, ultrasound used (permanent image in chart),,,,  Narrative:  Start time: 04/10/2020 10:11 AM End time: 04/10/2020 10:16 AM Injection made incrementally with aspirations every 5 mL.  Performed by: Personally  Anesthesiologist: Val Eagle, MD

## 2020-04-10 NOTE — Interval H&P Note (Signed)
History and Physical Interval Note:  04/10/2020 9:11 AM  Diane Stein  has presented today for surgery, with the diagnosis of LEFT KNEE AVASCULAR NECROSIS.  The various methods of treatment have been discussed with the patient and family. After consideration of risks, benefits and other options for treatment, the patient has consented to  Procedure(s): KNEE ARTHROSCOPY WITH SUBCHONDROPLASTY (Left) as a surgical intervention.  The patient's history has been reviewed, patient examined, no change in status, stable for surgery.  I have reviewed the patient's chart and labs.  Questions were answered to the patient's satisfaction.     Sheral Apley

## 2020-04-10 NOTE — Discharge Instructions (Signed)
Elevate leg and apply ice to reduce pain and swelling.   If needed, you may increase pain medication for the first few days post op to 2 tablets every 4 hours.  Diet: As you were doing prior to surgery.   Shower:  May shower but keep the wounds dry, use an occlusive plastic wrap, NO SOAKING IN TUB.  If the bandage gets wet, change with a clean dry gauze.   Dressing:  Keep dressings on and dry until follow up.  Activity:  Increase activity slowly as tolerated, but follow the weight bearing instructions below.  The rules on driving is that you can not be taking narcotics while you drive, and you must feel in control of the vehicle.    Weight Bearing:   As tolerated only when in knee immobilizer (long leg wrap with many Velcro straps).   To prevent constipation: you may use a stool softener such as -  Colace (over the counter) 100 mg by mouth twice a day  Drink plenty of fluids (prune juice may be helpful) and high fiber foods Miralax (over the counter) for constipation as needed.    Itching:  If you experience itching with your medications, try taking only a single pain pill, or even half a pain pill at a time.  You can also use benadryl over the counter for itching or also to help with sleep.   Precautions:  If you experience chest pain or shortness of breath - call 911 immediately for transfer to the hospital emergency department!!  If you develop a fever greater that 101 F, purulent drainage from wound, increased redness or drainage from wound, or calf pain -- Call the office at [phone]                                                 Follow- Up Appointment:  Please call for an appointment to be seen in 1-2 weeks Diane Stein - [phone]    Bone Grafting, Adult, Care After This sheet gives you information about how to care for yourself after your procedure. Your health care provider may also give you more specific instructions. If you have problems or questions, contact your  health care provider. What can I expect after the procedure? After the procedure, it is common to have:  Pain.  Bruising.  Swelling.  Stiffness. Follow these instructions at home: If you have a splint or brace:  Wear the splint or brace as told by your health care provider. Remove it only as told by your health care provider.  Loosen it if your fingers or toes tingle, become numb, or turn cold and blue.  Keep it clean and dry. If you have a cast:  Do not stick anything inside the cast to scratch your skin. Doing that increases your risk of infection.  Check the skin around the cast every day. Tell your health care provider about any concerns.  You may put lotion on dry skin around the edges of the cast. Do not put lotion on the skin underneath the cast.  Keep it clean and dry. Bathing  Do not take baths, swim, or use a hot tub until your health care provider approves. Ask your health care provider if you may take showers. You may only be allowed to take sponge baths.  If you have a cast, splint, or  brace that is not waterproof: ? Do not let it get wet. ? Cover it with a watertight covering when you take a bath or shower.  Keep the bandage (dressing) dry until your health care provider says it can be removed. Incision care   Follow instructions from your health care provider about how to take care of your incision. Make sure you: ? Wash your hands with soap and water before and after you change your dressing. If soap and water are not available, use hand sanitizer. ? Change your dressing as told by your health care provider. ? Leave stitches (sutures), skin glue, or adhesive strips in place. These skin closures may need to stay in place for 2 weeks or longer. If adhesive strip edges start to loosen and curl up, you may trim the loose edges. Do not remove adhesive strips completely unless your health care provider tells you to do that.  Check your incision area every day  for signs of infection. Check for: ? More redness, swelling, or pain. ? Fluid or blood. ? Warmth. ? Pus or a bad smell. Managing pain, stiffness, and swelling   If directed, put ice on the injured area. To do this: ? If you have a removable splint or brace, remove it as told by your health care provider. ? Put ice in a plastic bag. ? Place a towel between your skin and the bag or between your cast and the bag. ? Leave the ice on for 20 minutes, 2-3 times a day.  Move your fingers or toes often to reduce stiffness and swelling.  Raise (elevate) the injured area above the level of your heart while you are sitting or lying down. Driving  Do not drive for 24 hours if you were given a sedative during your procedure.  Ask your health care provider when it is safe to drive if you have a cast, splint, or brace on your arm, hand, foot, or leg. Medicines  Take over-the-counter and prescription medicines only as told by your health care provider.  Ask your health care provider if the medicine prescribed to you: ? Requires you to avoid driving or using heavy machinery. ? Can cause constipation. You may need to take these actions to prevent or treat constipation:  Drink enough fluid to keep your urine pale yellow.  Take over-the-counter or prescription medicines.  Eat foods that are high in fiber, such as beans, whole grains, and fresh fruits and vegetables.  Limit foods that are high in fat and processed sugars, such as fried or sweet foods. Activity  Rest as told by your health care provider.  Avoid sitting for a long time without moving. If told by your health care provider, get up to take short walks every 1-2 hours. This is important to improve blood flow and breathing. Ask for help if you feel weak or unsteady.  Return to your normal activities as told by your health care provider. Ask your health care provider what activities are safe for you.  Do not use the injured limb to  support your body weight until your health care provider says that you can. General instructions  Do not put pressure on any part of the cast or splint until it is fully hardened. This may take several hours.  Do not use any products that contain nicotine or tobacco, such as cigarettes, e-cigarettes, and chewing tobacco. These can delay healing after surgery. If you need help quitting, ask your health care provider.  Keep  all follow-up visits as told by your health care provider. This is important. Contact a health care provider if:  You have chills or a fever.  Your pain medicine is not helping.  You have more redness, swelling, or pain around the incision.  You have fluid or blood coming from the incision.  Your incision feels warm to the touch.  You have pus or a bad smell coming from the incision.  Your cast, splint, or brace: ? Is too loose or too tight. ? Gets damaged. Get help right away if:  You have pain or swelling that gets worse.  Your calf area gets red, warm, painful, or swollen.  You have chest pain.  You have trouble breathing.  You have numbness or tingling.  Your fingers or toes remain blue, cold, or numb after loosening your splint or brace. Summary  Follow instructions from your health care provider about how to take care of your incision.  Do not use the injured limb to support your body weight until your health care provider says that you can. Ask your health care provider what activities are safe for you.  Ask your health care provider when it is safe for you to drive.  Keep all follow-up visits as told by your health care provider. This information is not intended to replace advice given to you by your health care provider. Make sure you discuss any questions you have with your health care provider. Document Revised: 09/28/2018 Document Reviewed: 09/28/2018 Elsevier Patient Education  2020 [employer].    Post Anesthesia Home Care  Instructions  Activity: Get plenty of rest for the remainder of the day. A responsible individual must stay with you for 24 hours following the procedure.  For the next 24 hours, DO NOT: -Drive a car -Advertising copywriter -Drink alcoholic beverages -Take any medication unless instructed by your physician -Make any legal decisions or sign important papers.  Meals: Start with liquid foods such as gelatin or soup. Progress to regular foods as tolerated. Avoid greasy, spicy, heavy foods. If nausea and/or vomiting occur, drink only clear liquids until the nausea and/or vomiting subsides. Call your physician if vomiting continues.  Special Instructions/Symptoms: Your throat may feel dry or sore from the anesthesia or the breathing tube placed in your throat during surgery. If this causes discomfort, gargle with warm salt water. The discomfort should disappear within 24 hours.

## 2020-04-10 NOTE — Transfer of Care (Signed)
Immediate Anesthesia Transfer of Care Note  Patient: Domingo Sep  Procedure(s) Performed: KNEE ARTHROSCOPY WITH SUBCHONDROPLASTY, HARVEST OF BONE GRAFT FROM ILIAC CREST (Left Knee)  Patient Location: PACU  Anesthesia Type:General and Regional  Level of Consciousness: awake, alert , oriented and patient cooperative  Airway & Oxygen Therapy: Patient Spontanous Breathing and Patient connected to nasal cannula oxygen  Post-op Assessment: Report given to RN and Post -op Vital signs reviewed and stable  Post vital signs: Reviewed and stable  Last Vitals:  Vitals Value Taken Time  BP 117/76 04/10/20 1203  Temp    Pulse 99 04/10/20 1205  Resp 12 04/10/20 1205  SpO2 93 % 04/10/20 1205  Vitals shown include unvalidated device data.  Last Pain:  Vitals:   04/10/20 0945  TempSrc: Oral  PainSc: 6       Patients Stated Pain Goal: 4 (04/10/20 0945)  Complications: No complications documented.

## 2020-04-10 NOTE — Anesthesia Procedure Notes (Signed)
Procedure Name: LMA Insertion Date/Time: 04/10/2020 10:20 AM Performed by: Bishop Limbo, CRNA Pre-anesthesia Checklist: Patient identified, Emergency Drugs available, Suction available and Patient being monitored Patient Re-evaluated:Patient Re-evaluated prior to induction Oxygen Delivery Method: Circle System Utilized Preoxygenation: Pre-oxygenation with 100% oxygen Induction Type: IV induction Ventilation: Mask ventilation without difficulty LMA: LMA inserted LMA Size: 4.0 Number of attempts: 1 Placement Confirmation: positive ETCO2 Tube secured with: Tape Dental Injury: Teeth and Oropharynx as per pre-operative assessment

## 2020-04-10 NOTE — Progress Notes (Signed)
Assisted Dr. Moser with left, ultrasound guided, adductor canal block. Side rails up, monitors on throughout procedure. See vital signs in flow sheet. Tolerated Procedure well.  

## 2020-04-10 NOTE — Anesthesia Preprocedure Evaluation (Addendum)
Anesthesia Evaluation  Patient identified by MRN, date of birth, ID band Patient awake    Reviewed: Allergy & Precautions, NPO status , Patient's Chart, lab work & pertinent test results  History of Anesthesia Complications Negative for: history of anesthetic complications  Airway Mallampati: II  TM Distance: >3 FB Neck ROM: Full    Dental  (+) Dental Advisory Given, Teeth Intact   Pulmonary Current Smoker,    breath sounds clear to auscultation       Cardiovascular negative cardio ROS   Rhythm:Regular     Neuro/Psych PSYCHIATRIC DISORDERS Anxiety Depression negative neurological ROS     GI/Hepatic negative GI ROS, Neg liver ROS,   Endo/Other  negative endocrine ROS  Renal/GU negative Renal ROSLab Results      Component                Value               Date                      CREATININE               0.76                01/09/2020           Lab Results      Component                Value               Date                      K                        4.6                 01/09/2020                Musculoskeletal LEFT KNEE AVASCULAR NECROSIS   Abdominal   Peds  Hematology negative hematology ROS (+) Lab Results      Component                Value               Date                      WBC                      12.4 (H)            01/09/2020                HGB                      15.0                01/09/2020                HCT                      46.6 (H)            01/09/2020                MCV  91.2                01/09/2020                PLT                      305                 01/09/2020              Anesthesia Other Findings   Reproductive/Obstetrics S/p hysterectomy                             Anesthesia Physical Anesthesia Plan  ASA: II  Anesthesia Plan: General and Regional   Post-op Pain Management:  Regional for Post-op pain    Induction: Intravenous  PONV Risk Score and Plan: 2 and Ondansetron and Dexamethasone  Airway Management Planned: LMA  Additional Equipment: None  Intra-op Plan:   Post-operative Plan: Extubation in OR  Informed Consent: I have reviewed the patients History and Physical, chart, labs and discussed the procedure including the risks, benefits and alternatives for the proposed anesthesia with the patient or authorized representative who has indicated his/her understanding and acceptance.     Dental advisory given  Plan Discussed with: CRNA and Surgeon  Anesthesia Plan Comments:         Anesthesia Quick Evaluation

## 2020-04-11 ENCOUNTER — Encounter (HOSPITAL_BASED_OUTPATIENT_CLINIC_OR_DEPARTMENT_OTHER): Payer: Self-pay | Admitting: Orthopedic Surgery

## 2020-04-11 NOTE — Op Note (Signed)
PATIENT:  Diane Stein    PRE-OPERATIVE DIAGNOSIS:  RIGHT KNEE AVASCULAR NECROSIS  POST-OPERATIVE DIAGNOSIS:  Same  PROCEDURE:  DIAGNOSTIC ARTHROSCOPY KNEE, KNEE ARTHROSCOPY WITH SUBCHONDROPLASTY, BONE MARROW ASPIRATION  SURGEON:  Sheral Apley, MD  ASSISTANT: Daun Peacock, PA-C, he was present and scrubbed throughout the case, critical for completion in a timely fashion, and for retraction, instrumentation, and closure.   ANESTHESIA:   General  BLOOD LOSS: min  COMPLICATIONS: None   PREOPERATIVE INDICATIONS:  Diane Stein is a  34 y.o. female with a diagnosis of LEFT KNEE AVASCULAR NECROSIS who failed conservative measures and elected for surgical management.    The risks benefits and alternatives were discussed with the patient preoperatively including but not limited to the risks of infection, bleeding, nerve injury, cardiopulmonary complications, the need for revision surgery, among others, and the patient was willing to proceed.  OPERATIVE IMPLANTS: biologic bone graft to med and lat fem chondyles  OPERATIVE FINDINGS: Examination under anesthesia: stable Diagnostic Arthroscopy:  articular cartilage:stable, grade `1 chondromalacia LFC Medial meniscus:stable Lateral meniscus:stable Anterior cruciate ligament/PCL: intact Loose bodies: none    OPERATIVE PROCEDURE:  Patient was identified in the preoperative holding area and site was marked by me female was transported to the operating theater and placed on the table in supine position taking care to pad all bony prominences. After a preincinduction time out anesthesia was induced.  female received ancef for preoperative antibiotics. The left lower extremity was prepped and draped in normal sterile fashion and a pre-incision timeout was performed.   The illiac crest was draped out and prepped prior to placing the extremity drape. This was then covered with an ioban.  I made a small incision  over the crest. I placed a percutaneous canula into the iliac crest posterior to the asis and was able to harvest marrow and bone graft. This was then taken to the back table  I irrigated and closed this incision.  Centrifuge was used to spin this graft down and combined with allograft bone graft.   A small stab incision was made in the anterolateral portal position. The arthroscope was introduced in the joint. A medial portal was then established under direct visualization just above the anterior horn of the medial meniscus. Diagnostic arthroscopy was then carried out with findings as described above.  Cartilage over the lesions was intact   I then used 4+ views of fluoroscopy to place a percutaneous canula into the lateral fem chondyle. I injected bioplasty material into the insuficciency lesions aiding in fixation here of these impending fracture sites of osteochondral bone. I did this in the ant and post LFC. This percutaneous bone grafting and fixing of the impending fracture was then repeated on the medial femoral chondyle.    I reinserted the arthroscope into the knee and confirmed no articular penetration.   The arthroscopic equipment was removed from the joint and the portals were closed with 3-0 nylon in an interrupted fashion. The knee was infiltrated with marcaine.  Sterile dressings were then applied including Xeroform 4 x 4's ABDs an ACE bandage.  The patient was then allowed to awaken from general anesthesia, transferred to the stretcher and taken to the recovery room in stable condition.  POSTOPERATIVE PLAN: The patient will be discharged home today and will followup in one week for suture removal and wound check.  VTE prophylaxis: mobilize and ASA

## 2020-05-24 ENCOUNTER — Ambulatory Visit (HOSPITAL_COMMUNITY)
Admission: RE | Admit: 2020-05-24 | Discharge: 2020-05-24 | Disposition: A | Discharge status: Home / Self Care | Payer: Medicaid Other | Source: Ambulatory Visit | Attending: Cardiology | Admitting: Cardiology

## 2020-05-24 ENCOUNTER — Other Ambulatory Visit: Payer: Self-pay

## 2020-05-24 ENCOUNTER — Other Ambulatory Visit (HOSPITAL_COMMUNITY): Payer: Self-pay | Admitting: Orthopedic Surgery

## 2020-05-24 DIAGNOSIS — M79661 Pain in right lower leg: Secondary | ICD-10-CM | POA: Insufficient documentation

## 2020-05-24 DIAGNOSIS — M7989 Other specified soft tissue disorders: Secondary | ICD-10-CM | POA: Insufficient documentation

## 2020-05-24 DIAGNOSIS — M79662 Pain in left lower leg: Secondary | ICD-10-CM

## 2020-07-13 ENCOUNTER — Other Ambulatory Visit: Payer: Self-pay

## 2020-07-13 ENCOUNTER — Encounter (HOSPITAL_BASED_OUTPATIENT_CLINIC_OR_DEPARTMENT_OTHER): Payer: Self-pay | Admitting: *Deleted

## 2020-07-13 ENCOUNTER — Emergency Department (HOSPITAL_BASED_OUTPATIENT_CLINIC_OR_DEPARTMENT_OTHER)
Admission: EM | Admit: 2020-07-13 | Discharge: 2020-07-13 | Disposition: A | Discharge status: Home / Self Care | Payer: Medicaid Other | Attending: Emergency Medicine | Admitting: Emergency Medicine

## 2020-07-13 ENCOUNTER — Emergency Department (HOSPITAL_BASED_OUTPATIENT_CLINIC_OR_DEPARTMENT_OTHER): Payer: Medicaid Other

## 2020-07-13 DIAGNOSIS — F17210 Nicotine dependence, cigarettes, uncomplicated: Secondary | ICD-10-CM | POA: Insufficient documentation

## 2020-07-13 DIAGNOSIS — M25562 Pain in left knee: Secondary | ICD-10-CM | POA: Insufficient documentation

## 2020-07-13 DIAGNOSIS — J45909 Unspecified asthma, uncomplicated: Secondary | ICD-10-CM | POA: Insufficient documentation

## 2020-07-13 MED ORDER — IBUPROFEN 400 MG PO TABS
600.0000 mg | ORAL_TABLET | Freq: Once | ORAL | Status: AC
  Administered 2020-07-13: 600 mg via ORAL
  Filled 2020-07-13: qty 1

## 2020-07-13 NOTE — ED Triage Notes (Signed)
Left knee injury while getting out of shower tonight. She heard a "pop." ambulatory.

## 2020-07-13 NOTE — ED Provider Notes (Signed)
[facility] EMERGENCY DEPARTMENT Provider Note   CSN: [id number] Arrival date & time: 07/13/20  1953     History Chief Complaint  Patient presents with  . Knee Injury    Diane Stein is a 35 y.o. female.  Patient is a 35 year old female who presents with pain in her left knee.  She states that she was getting out of the shower and putting her underwear her on when she felt a pop in her left knee.  She did not have a specific injury.  She did not really feel like she twisted it or stepped on it wrong.  She felt sudden pain in her knee.  This happened just prior to arrival.  She has had constant throbbing pain since that time.  She has not taking anything at home for the pain.  She denies any pain in her hip or ankle.  She has had prior knee issues in both of her knees and has had bilateral arthroscopies.  She is followed by Dr. Eulah Pont.        Past Medical History:  Diagnosis Date  . Anxiety   . Asthma    well controlled  . Avascular necrosis (HCC)    left knee  . Bronchitis    history  . Depression   . Dysrhythmia    rapid heart rate, released by cardiology 2016  . Pneumonia 2014, 2019   History  . Smoker    0.5 ppd x 10 yrs  . SVD (spontaneous vaginal delivery)    x 1  . Tachycardia 2016    Patient Active Problem List   Diagnosis Date Noted  . Post-op pneumonia 03/14/2018  . 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 Pelvic pain in female 08/28/2017  . Paratubal cyst 08/28/2017  . Chest pain at rest 12/26/2016  . Dizziness 12/26/2016  . Syncope and collapse 08/23/2016  . Depression   . Anxiety   . SVD (spontaneous vaginal delivery) 11/08/2014  . Irregular uterine contractions 11/07/2014  . Palpitations 05/12/2014  . Paroxysmal supraventricular tachycardia (HCC) 05/12/2014  . Pregnancy 05/12/2014    Past Surgical History:  Procedure Laterality Date  . ABDOMINAL HYSTERECTOMY    . BONE  MARROW BIOPSY Right 01/10/2020   Procedure: BONE MARROW ASPIRATION;  Surgeon: Sheral Apley, MD;  Location: [facility];  Service: Orthopedics;  Laterality: Right;  . HYSTERECTOMY ABDOMINAL WITH SALPINGO-OOPHORECTOMY Bilateral 03/10/2018   Procedure: TOTAL ABDOMINAL HYSTERECTOMY WITH BILATERAL SALPINGO-OOPHORECTOMY;  Surgeon: Lavina Hamman, MD;  Location: [facility];  Service: Gynecology;  Laterality: Bilateral;  . KNEE ARTHROSCOPY Right 01/10/2020   Procedure: DIAGNOSTIC ARTHROSCOPY KNEE;  Surgeon: Sheral Apley, MD;  Location: [facility];  Service: Orthopedics;  Laterality: Right;  . KNEE ARTHROSCOPY WITH SUBCHONDROPLASTY  01/10/2020   Procedure: KNEE ARTHROSCOPY WITH SUBCHONDROPLASTY;  Surgeon: Sheral Apley, MD;  Location: [facility];  Service: Orthopedics;;  . KNEE ARTHROSCOPY WITH SUBCHONDROPLASTY Left 04/10/2020   Procedure: KNEE ARTHROSCOPY WITH SUBCHONDROPLASTY, HARVEST OF BONE GRAFT FROM ILIAC CREST;  Surgeon: Sheral Apley, MD;  Location: [facility];  Service: Orthopedics;  Laterality: Left;  . LAPAROSCOPIC UNILATERAL SALPINGECTOMY Left 08/28/2017   Procedure: LAPAROSCOPIC UNILATERAL SALPINGECTOMY WITH REMOVAL OF PARA TUBAL CYST;  Surgeon: Lavina Hamman, MD;  Location: [facility];  Service: Gynecology;  Laterality: Left;  . LAPAROSCOPY Left 08/28/2017   Procedure: LAPAROSCOPY OPERATIVE, FULGERATION OF ENDOMETRIOSIS;  Surgeon: Lavina Hamman, MD;  Location: [facility];  Service: Gynecology;  Laterality: Left;  . TOOTH EXTRACTION Right 2008     OB History    Gravida  5   Para  1   Term  1   Preterm      AB  3   Living  1     SAB  3   IAB      Ectopic      Multiple  0   Live Births  1           Family History  Problem Relation Age of Onset  . Hypertension Mother   . Heart disease Father   . Heart disease Maternal Grandmother   . Hypertension Maternal Grandmother   . Heart disease Maternal  Grandfather   . Hypertension Maternal Grandfather   . Heart disease Paternal Grandmother   . Hypertension Paternal Grandmother   . Heart disease Paternal Grandfather   . Hypertension Paternal Grandfather     Social History   Tobacco Use  . Smoking status: Current Every Day Smoker    Packs/day: 0.25    Years: 10.00    Pack years: 2.50    Types: Cigarettes  . Smokeless tobacco: Never Used  Vaping Use  . Vaping Use: Never used  Substance Use Topics  . Alcohol use: Never  . Drug use: No    Home Medications Prior to Admission medications   Medication Sig Start Date End Date Taking? Authorizing Provider  acetaminophen (TYLENOL) 500 MG tablet Take 500 mg by mouth every 6 (six) hours as needed.   Yes [provider]  alprazolam Prudy Feeler) 2 MG tablet Take 2 mg by mouth 3 (three) times daily. Takes 1st dose at 79 Noon and second dose a few hours later   Yes [provider]  doxepin (SINEQUAN) 50 MG capsule Take 50-100 mg by mouth at bedtime as needed (sleep).    Yes [provider]  FLUoxetine (PROZAC) 20 MG capsule Take 60 mg by mouth daily.   Yes [provider]  melatonin 3 MG TABS tablet Take 3 mg by mouth at bedtime as needed.    Yes [provider]  albuterol (VENTOLIN HFA) 108 (90 Base) MCG/ACT inhaler Inhale 2 puffs into the lungs every 6 (six) hours as needed for wheezing or shortness of breath.  09/13/18   [provider]  EPINEPHrine 0.3 mg/0.3 mL IJ SOAJ injection Inject 0.3 mLs (0.3 mg total) into the muscle once as needed for up to 1 dose (anaphalaxis). 01/09/18   Loren Racer, MD  levocetirizine (XYZAL) 5 MG tablet Take 5 mg by mouth every evening.    [provider]    Allergies    Avocado, Other, and Benadryl [diphenhydramine]  Review of Systems   Review of Systems  Constitutional: Negative for fever.  Gastrointestinal: Negative for nausea and vomiting.  Musculoskeletal: Positive for arthralgias and  joint swelling. Negative for back pain and neck pain.  Skin: Negative for wound.  Neurological: Negative for weakness, numbness and headaches.    Physical Exam Updated Vital Signs BP 129/79   Pulse (!) 113   Temp 97.6 F (36.4 C) (Oral)   Ht 5\' 4"  (1.626 m)   Wt 94.6 kg   LMP  (LMP Unknown)   SpO2 96%   BMI 35.80 kg/m   Physical Exam Constitutional:      Appearance: She is well-developed and well-nourished.  HENT:     Head: Normocephalic and atraumatic.  Cardiovascular:     Rate and Rhythm:  Normal rate.  Pulmonary:     Effort: Pulmonary effort is normal.  Musculoskeletal:        General: Tenderness and edema present.     Cervical back: Normal range of motion and neck supple.     Comments: Patient has diffuse tenderness primarily over the anterior left knee.  There are some mild swelling and effusion noted.  She is able do a straight leg raise.  patellar and quadriceps tendon appear to be intact.  I am unable to get an adequate ligament exam due to her discomfort although I do not appreciate any gross instability.  She has no pain to the hip or ankle.  She is neurovascularly intact distally.  No wounds are noted.  No warmth or erythema is noted.  Skin:    General: Skin is warm and dry.  Neurological:     Mental Status: She is alert and oriented to person, place, and time.  Psychiatric:        Mood and Affect: Mood and affect normal.     ED Results / Procedures / Treatments   Labs (all labs ordered are listed, but only abnormal results are displayed) Labs Reviewed - No data to display  EKG None  Radiology DG Knee Complete 4 Views Left  Result Date: 07/13/2020 CLINICAL DATA:  Marthe Patch a loud pop in her left knee tonight with subsequent left knee pain. EXAM: LEFT KNEE - COMPLETE 4+ VIEW COMPARISON:  May 28, 2020 FINDINGS: No evidence of acute fracture or dislocation. No evidence of arthropathy or other focal bone abnormality. A small, stable joint effusion is noted.  IMPRESSION: Small, stable joint effusion without acute bony abnormality. Electronically Signed   By: Aram Candela M.D.   On: 07/13/2020 21:00    Procedures Procedures   Medications Ordered in ED Medications  ibuprofen (ADVIL) tablet 600 mg (has no administration in time range)    ED Course  I have reviewed the triage vital signs and the nursing notes.  Pertinent labs & imaging results that were available during my care of the patient were reviewed by me and considered in my medical decision making (see chart for details).    MDM Rules/Calculators/A&P                          Patient had x-rays which show no acute bony abnormality.  No fracture is noted.  There a some small effusion noted.  She was placed in a knee sleeve.  She was advised on ice and elevation.  She was advised to use ibuprofen or Tylenol for symptomatically.  She will follow-up with her orthopedist.  Return precautions were given.  Final Clinical Impression(s) / ED Diagnoses Final diagnoses:  Acute pain of left knee    Rx / DC Orders ED Discharge Orders    None       Rolan Bucco, MD 07/13/20 2232

## 2020-07-25 DIAGNOSIS — M8700 Idiopathic aseptic necrosis of unspecified bone: Secondary | ICD-10-CM | POA: Diagnosis present

## 2020-07-25 DIAGNOSIS — M87052 Idiopathic aseptic necrosis of left femur: Secondary | ICD-10-CM | POA: Diagnosis present

## 2020-07-25 NOTE — H&P (Signed)
KNEE ARTHROPLASTY ADMISSION H&P  Patient ID: Diane Stein MRN: [id number] DOB/AGE: [birth year] 34 y.o.  Chief Complaint: left knee pain.  Planned Procedure Date: 08/07/20  Medical Clearance by Bradly Bienenstock NP     HPI: Diane Stein is a 35 y.o. female who presents for evaluation of avascular necrosis of the LEFT KNEE. The patient has a history of pain and functional disability in the left knee due to avascular necrosis and has failed non-surgical conservative treatments for greater than 12 weeks to include NSAID's and/or analgesics, use of assistive devices, activity modification and surgery.  Onset of symptoms was abrupt, starting 1 years ago with rapidlly worsening course since that time. The patient noted prior procedures on the knee to include  arthroscopy on the left knee.  Patient currently rates pain at 9 out of 10 with activity. Patient has night pain, worsening of pain with activity and weight bearing, pain that interferes with activities of daily living, pain with passive range of motion and joint swelling.  Patient has evidence of avascular necrosis to the medial and lateral femoral condyles of the left femur by imaging studies.  There is no active infection.  Past Medical History:  Diagnosis Date  . Anxiety   . Asthma    well controlled  . Avascular necrosis (HCC)    left knee  . Bronchitis    history  . Depression   . Dysrhythmia    rapid heart rate, released by cardiology 2016  . Pneumonia 2014, 2019   History  . Smoker    0.5 ppd x 10 yrs  . SVD (spontaneous vaginal delivery)    x 1  . Tachycardia 2016   Past Surgical History:  Procedure Laterality Date  . ABDOMINAL HYSTERECTOMY    . BONE MARROW BIOPSY Right 01/10/2020   Procedure: BONE MARROW ASPIRATION;  Surgeon: Renette Butters, MD;  Location: [place];  Service: Orthopedics;  Laterality: Right;  . HYSTERECTOMY ABDOMINAL WITH SALPINGO-OOPHORECTOMY Bilateral 03/10/2018    Procedure: TOTAL ABDOMINAL HYSTERECTOMY WITH BILATERAL SALPINGO-OOPHORECTOMY;  Surgeon: Cheri Fowler, MD;  Location: [facility];  Service: Gynecology;  Laterality: Bilateral;  . KNEE ARTHROSCOPY Right 01/10/2020   Procedure: DIAGNOSTIC ARTHROSCOPY KNEE;  Surgeon: Renette Butters, MD;  Location: [facility];  Service: Orthopedics;  Laterality: Right;  . KNEE ARTHROSCOPY WITH SUBCHONDROPLASTY  01/10/2020   Procedure: KNEE ARTHROSCOPY WITH SUBCHONDROPLASTY;  Surgeon: Renette Butters, MD;  Location: [place];  Service: Orthopedics;;  . KNEE ARTHROSCOPY WITH SUBCHONDROPLASTY Left 04/10/2020   Procedure: KNEE ARTHROSCOPY WITH SUBCHONDROPLASTY, HARVEST OF BONE GRAFT FROM ILIAC CREST;  Surgeon: Renette Butters, MD;  Location: [place];  Service: Orthopedics;  Laterality: Left;  . LAPAROSCOPIC UNILATERAL SALPINGECTOMY Left 08/28/2017   Procedure: LAPAROSCOPIC UNILATERAL SALPINGECTOMY WITH REMOVAL OF PARA TUBAL CYST;  Surgeon: Cheri Fowler, MD;  Location: [facility];  Service: Gynecology;  Laterality: Left;  . LAPAROSCOPY Left 08/28/2017   Procedure: LAPAROSCOPY OPERATIVE, FULGERATION OF ENDOMETRIOSIS;  Surgeon: Cheri Fowler, MD;  Location: [facility];  Service: Gynecology;  Laterality: Left;  . TOOTH EXTRACTION Right 2008   Allergies  Allergen Reactions  . Avocado Anaphylaxis and Shortness Of Breath    dizziness  . Other Hives    Pine Nuts = HIVES   . Benadryl [Diphenhydramine]     Restless leg syndrome   Prior to Admission medications   Medication Sig Start Date End Date Taking? Authorizing Provider  acetaminophen (TYLENOL) 500 MG tablet Take  1,000 mg by mouth every 6 (six) hours as needed for moderate pain or headache.   Yes [provider]  alprazolam Duanne Moron) 2 MG tablet Take 2 mg by mouth 3 (three) times daily as needed for anxiety.   Yes [provider]  doxepin (SINEQUAN) 50 MG capsule Take 50-100 mg by mouth at bedtime as needed  (sleep).    Yes [provider]  EPINEPHrine 0.3 mg/0.3 mL IJ SOAJ injection Inject 0.3 mLs (0.3 mg total) into the muscle once as needed for up to 1 dose (anaphalaxis). 01/09/18  Yes Julianne Rice, MD  FLUoxetine (PROZAC) 20 MG capsule Take 60 mg by mouth daily.   Yes [provider]  hydrOXYzine (ATARAX/VISTARIL) 10 MG tablet Take 10 mg by mouth See admin instructions. Take 10 mg daily, may take a second 10 mg dose as needed for anxiety   Yes [provider]  levocetirizine (XYZAL) 5 MG tablet Take 5 mg by mouth every evening.   Yes [provider]  melatonin 3 MG TABS tablet Take 3 mg by mouth at bedtime as needed (sleep).   Yes [provider]  ondansetron (ZOFRAN) 4 MG tablet Take 4 mg by mouth every 8 (eight) hours as needed for nausea/vomiting. 04/16/20  Yes [provider]  rosuvastatin (CRESTOR) 10 MG tablet Take 10 mg by mouth at bedtime.   Yes [provider]  diclofenac (VOLTAREN) 75 MG EC tablet Take 75 mg by mouth 2 (two) times daily.    [provider]  HYDROcodone-acetaminophen (NORCO/VICODIN) 5-325 MG tablet Take 1-2 tablets by mouth every 6 (six) hours as needed for pain. 07/11/20   [provider]   Social History   Socioeconomic History  . Marital status: Single    Spouse name: Not on file  . Number of children: 1  . Years of education: Not on file  . Highest education level: Not on file  Occupational History  . Not on file  Tobacco Use  . Smoking status: Current Every Day Smoker    Packs/day: 0.25    Years: 10.00    Pack years: 2.50    Types: Cigarettes  . Smokeless tobacco: Never Used  Vaping Use  . Vaping Use: Never used  Substance and Sexual Activity  . Alcohol use: Never  . Drug use: No  . Sexual activity: Yes    Birth control/protection: Surgical  Other Topics Concern  . Not on file  Social History Narrative  . Not on file   Social Determinants of Health   Financial  Resource Strain: Not on file  Food Insecurity: Not on file  Transportation Needs: Not on file  Physical Activity: Not on file  Stress: Not on file  Social Connections: Not on file   Family History  Problem Relation Age of Onset  . Hypertension Mother   . Heart disease Father   . Heart disease Maternal Grandmother   . Hypertension Maternal Grandmother   . Heart disease Maternal Grandfather   . Hypertension Maternal Grandfather   . Heart disease Paternal Grandmother   . Hypertension Paternal Grandmother   . Heart disease Paternal Grandfather   . Hypertension Paternal Grandfather     ROS: Currently denies lightheadedness, dizziness, Fever, chills, CP, SOB, nausea, or vomiting.   No personal history of DVT, PE, MI, or CVA.  No loose teeth or dentures. All other systems have been reviewed and were otherwise currently negative with the exception of those mentioned in the HPI and as  above.  Objective: Vitals: Ht: 5'5" Wt: 210.2 lbs Temp: 98.1 BP: 131/83 Pulse: 126 O2 96% on room air.   Physical Exam: General: Alert, NAD.  Antalgic Gait  HEENT: EOMI, Good Neck Extension, Trachea is midline. Head is normocephalic, atraumatic. Pulm: No increased work of breathing.  Clear B/L A/P w/o crackle or wheeze.  CV: RRR, No m/g/r appreciated  GI: soft, NT, ND. BS in all 4 quadrants. Neuro: Neuro without gross focal deficit.  Sensation intact distally Skin: No lesions in the area of chief complaint MSK/Surgical Site: left knee w/o redness or effusion.  Medial and lateral JLT. ROM 10-90 degrees.  3/5 strength in extension and flexion.  +EHL/FHL.  NVI.     Imaging Review Plain radiographs demonstrate advanced avascular necrosis of the left knee.   The overall alignment ismild valgus. The bone quality appears to be good for age and reported activity level.  Preoperative templating of the joint replacement has been completed, documented, and submitted to the Operating Room personnel in order to  optimize intra-operative equipment management.  Assessment: DEGENERATIVE JOINT DISEASE  LEFT KNEE Active Problems:   Avascular necrosis of left femur (HCC)   Plan: Plan for Procedure(s): TOTAL KNEE ARTHROPLASTY  The patient history, physical exam, clinical judgement of the provider and imaging are consistent with avascular necrosis and left joint arthroplasty is deemed medically necessary. The treatment options including medical management, injection therapy, and arthroplasty were discussed at length. The risks and benefits of Procedure(s): TOTAL KNEE ARTHROPLASTY were presented and reviewed.  The risks of nonoperative treatment, versus surgical intervention including but not limited to continued pain, aseptic loosening, stiffness, dislocation/subluxation, infection, bleeding, nerve injury, blood clots, cardiopulmonary complications, morbidity, mortality, among others were discussed. The patient verbalizes understanding and wishes to proceed with the plan.   Patient is being admitted to [facility] for inpatient treatment for surgery, pain control, PT, prophylactic antibiotics, VTE prophylaxis, progressive ambulation, ADL's and discharge planning. She may need to stay the night in observation.   Dental prophylaxis discussed and recommended for 2 years postoperatively.   The patient does meet the criteria for TXA which will be used perioperatively.    ASA 81 mg BID will be used postoperatively for DVT prophylaxis in addition to SCDs, and early ambulation.  Plan for Tylenol, Celebrex, and Oxycodone for pain.  Robaxin for muscle spasms.  Zofran for nausea and vomiting. Omeprazole for gastric protection.  The patient is planning to be discharged home with OPPT in care of her mother Mardene Celeste and sister [place]. Mardene Celeste can be reached at [phone]. [place] can be reached at [phone].   Follow up appointment is scheduled for 07/18/20 at 4:15pm.   Has a rolling walker at home to use  post-operatively.    Patient's anticipated LOS is less than 2 midnights, meeting these requirements: - Younger than 49 - Lives within 1 hour of care - Has a competent adult at home to recover with post-op recover - NO history of  - Chronic pain requiring opiods  - Diabetes  - Coronary Artery Disease  - Heart failure  - Heart attack  - Stroke  - DVT/VTE  - Cardiac arrhythmia  - Respiratory Failure/COPD  - Renal failure  - Anemia  - Advanced Liver disease      Alisa Graff Office [phone] 08/01/2020 9:02 AM

## 2020-07-30 ENCOUNTER — Other Ambulatory Visit: Payer: Self-pay

## 2020-07-30 ENCOUNTER — Encounter (HOSPITAL_COMMUNITY): Payer: Self-pay

## 2020-07-30 ENCOUNTER — Ambulatory Visit (HOSPITAL_COMMUNITY): Admission: RE | Admit: 2020-07-30 | Payer: Medicaid Other | Source: Ambulatory Visit

## 2020-07-30 ENCOUNTER — Encounter (HOSPITAL_COMMUNITY)
Admission: RE | Admit: 2020-07-30 | Discharge: 2020-07-30 | Disposition: A | Discharge status: Home / Self Care | Payer: Medicaid Other | Source: Ambulatory Visit | Attending: Orthopedic Surgery | Admitting: Orthopedic Surgery

## 2020-07-30 DIAGNOSIS — Z96652 Presence of left artificial knee joint: Secondary | ICD-10-CM | POA: Diagnosis not present

## 2020-07-30 DIAGNOSIS — Z01818 Encounter for other preprocedural examination: Secondary | ICD-10-CM | POA: Diagnosis present

## 2020-07-30 HISTORY — DX: Unspecified osteoarthritis, unspecified site: M19.90

## 2020-07-30 LAB — BASIC METABOLIC PANEL
Anion gap: 9 (ref 5–15)
BUN: 14 mg/dL (ref 6–20)
CO2: 24 mmol/L (ref 22–32)
Calcium: 9.2 mg/dL (ref 8.9–10.3)
Chloride: 108 mmol/L (ref 98–111)
Creatinine, Ser: 0.69 mg/dL (ref 0.44–1.00)
GFR, Estimated: >60 mL/min (ref >60)
Glucose, Bld: 94 mg/dL (ref 70–99)
Potassium: 4 mmol/L (ref 3.5–5.1)
Sodium: 141 mmol/L (ref 135–145)

## 2020-07-30 LAB — CBC
HCT: 44.3 % (ref 36.0–46.0)
Hemoglobin: 14 g/dL (ref 12.0–15.0)
MCH: 29.4 pg (ref 26.0–34.0)
MCHC: 31.6 g/dL (ref 30.0–36.0)
MCV: 92.9 fL (ref 80.0–100.0)
Platelets: 310 10*3/uL (ref 150–400)
RBC: 4.77 MIL/uL (ref 3.87–5.11)
RDW: 15.3 % (ref 11.5–15.5)
WBC: 16.9 10*3/uL — ABNORMAL HIGH (ref 4.0–10.5)
nRBC: 0 % (ref 0.0–0.2)

## 2020-07-30 LAB — SURGICAL PCR SCREEN
MRSA, PCR: NEGATIVE
Staphylococcus aureus: NEGATIVE

## 2020-07-30 NOTE — Progress Notes (Addendum)
Anesthesia Review:  PCP: DR Kathlen Brunswick - 05/29/20 preop exam in Care Everywhere  Cardiologist : Chest x-ray : 12/21/19- One View  EKG :05/29/20 - on chart  Echo : Stress test: Cardiac Cath :  Activity level: can do a flight of stairs without difficulty  Sleep Study/ CPAP : no  Fasting Blood Sugar :      / Checks Blood Sugar -- times a day:   Blood Thinner/ Instructions /Last Dose: ASA / Instructions/ Last Dose :  CBC done 07/30/2020 routed to DR Margarita Rana.

## 2020-07-30 NOTE — Progress Notes (Signed)
DUE TO COVID-19 ONLY ONE VISITOR IS ALLOWED TO COME WITH YOU AND STAY IN THE WAITING ROOM ONLY DURING PRE OP AND PROCEDURE DAY OF SURGERY. THE 1 VISITOR  MAY VISIT WITH YOU AFTER SURGERY IN YOUR PRIVATE ROOM DURING VISITING HOURS ONLY!  YOU NEED TO HAVE A COVID 19 TEST ON___3/02/2021 ____ @_______ , THIS TEST MUST BE DONE BEFORE SURGERY,  COVID TESTING SITE [address] [place] Diane Stein , IT IS ON THE RIGHT GOING OUT [address] APPROXIMATELY  2 MINUTES PAST [employer] ON THE RIGHT. ONCE YOUR COVID TEST IS COMPLETED,  PLEASE BEGIN THE QUARANTINE INSTRUCTIONS AS OUTLINED IN YOUR HANDOUT.                Diane Stein  07/30/2020   Your procedure is scheduled on:  08/07/2020   Report to [facility] Main  Entrance   Report to admitting at    0530 AM     Call this number if you have problems the morning of surgery [phone]    REMEMBER: NO  SOLID FOOD CANDY OR GUM AFTER MIDNIGHT. CLEAR LIQUIDS UNTIL 0430am      . NOTHING BY MOUTH EXCEPT CLEAR LIQUIDS UNTIL    . PLEASE FINISH ENSURE DRINK PER SURGEON ORDER  WHICH NEEDS TO BE COMPLETED AT    0430 am   .      CLEAR LIQUID DIET   Foods Allowed                                                                    Coffee and tea, regular and decaf                            Fruit ices (not with fruit pulp)                                      Iced Popsicles                                    Carbonated beverages, regular and diet                                    Cranberry, grape and apple juices Sports drinks like Gatorade Lightly seasoned clear broth or consume(fat free) Sugar, honey syrup ___________________________________________________________________      BRUSH YOUR TEETH MORNING OF SURGERY AND RINSE YOUR MOUTH OUT, NO CHEWING GUM CANDY OR MINTS.     Take these medicines the morning of surgery with A SIP OF WATER:  prozac  DO NOT TAKE ANY DIABETIC MEDICATIONS DAY OF YOUR SURGERY                                You may not have any metal on your body including hair pins and              piercings  Do not wear jewelry,  make-up, lotions, powders or perfumes, deodorant             Do not wear nail polish on your fingernails.  Do not shave  48 hours prior to surgery.              Men may shave face and neck.   Do not bring valuables to the hospital. [place].  Contacts, dentures or bridgework may not be worn into surgery.  Leave suitcase in the car. After surgery it may be brought to your room.     Patients discharged the day of surgery will not be allowed to drive home. IF YOU ARE HAVING SURGERY AND GOING HOME THE SAME DAY, YOU MUST HAVE AN ADULT TO DRIVE YOU HOME AND BE WITH YOU FOR 24 HOURS. YOU MAY GO HOME BY TAXI OR UBER OR ORTHERWISE, BUT AN ADULT MUST ACCOMPANY YOU HOME AND STAY WITH YOU FOR 24 HOURS.  Name and phone number of your driver:  Special Instructions: N/A              Please read over the following fact sheets you were given: _____________________________________________________________________  [facility] - Preparing for Surgery Before surgery, you can play an important role.  Because skin is not sterile, your skin needs to be as free of germs as possible.  You can reduce the number of germs on your skin by washing with CHG (chlorahexidine gluconate) soap before surgery.  CHG is an antiseptic cleaner which kills germs and bonds with the skin to continue killing germs even after washing. Please DO NOT use if you have an allergy to CHG or antibacterial soaps.  If your skin becomes reddened/irritated stop using the CHG and inform your nurse when you arrive at Short Stay. Do not shave (including legs and underarms) for at least 48 hours prior to the first CHG shower.  You may shave your face/neck. Please follow these instructions carefully:  1.  Shower with CHG Soap the night before surgery and the  morning of  Surgery.  2.  If you choose to wash your hair, wash your hair first as usual with your  normal  shampoo.  3.  After you shampoo, rinse your hair and body thoroughly to remove the  shampoo.                           4.  Use CHG as you would any other liquid soap.  You can apply chg directly  to the skin and wash                       Gently with a scrungie or clean washcloth.  5.  Apply the CHG Soap to your body ONLY FROM THE NECK DOWN.   Do not use on face/ open                           Wound or open sores. Avoid contact with eyes, ears mouth and genitals (private parts).                       Wash face,  Genitals (private parts) with your normal soap.             6.  Wash thoroughly, paying special attention  to the area where your surgery  will be performed.  7.  Thoroughly rinse your body with warm water from the neck down.  8.  DO NOT shower/wash with your normal soap after using and rinsing off  the CHG Soap.                9.  Pat yourself dry with a clean towel.            10.  Wear clean pajamas.            11.  Place clean sheets on your bed the night of your first shower and do not  sleep with pets. Day of Surgery : Do not apply any lotions/deodorants the morning of surgery.  Please wear clean clothes to the hospital/surgery center.  FAILURE TO FOLLOW THESE INSTRUCTIONS MAY RESULT IN THE CANCELLATION OF YOUR SURGERY PATIENT SIGNATURE_________________________________  NURSE SIGNATURE__________________________________  ________________________________________________________________________

## 2020-08-03 ENCOUNTER — Other Ambulatory Visit (HOSPITAL_COMMUNITY)
Admission: RE | Admit: 2020-08-03 | Discharge: 2020-08-03 | Disposition: A | Discharge status: Home / Self Care | Payer: Medicaid Other | Source: Ambulatory Visit | Attending: Orthopedic Surgery | Admitting: Orthopedic Surgery

## 2020-08-03 DIAGNOSIS — Z01812 Encounter for preprocedural laboratory examination: Secondary | ICD-10-CM | POA: Diagnosis present

## 2020-08-03 DIAGNOSIS — Z20822 Contact with and (suspected) exposure to covid-19: Secondary | ICD-10-CM | POA: Insufficient documentation

## 2020-08-03 LAB — SARS CORONAVIRUS 2 (TAT 6-24 HRS): SARS Coronavirus 2: NEGATIVE

## 2020-08-07 ENCOUNTER — Encounter (HOSPITAL_COMMUNITY): Payer: Self-pay | Admitting: Orthopedic Surgery

## 2020-08-07 ENCOUNTER — Other Ambulatory Visit: Payer: Self-pay

## 2020-08-07 ENCOUNTER — Encounter (HOSPITAL_COMMUNITY)
Admission: RE | Disposition: A | Discharge status: Home / Self Care | Payer: Self-pay | Source: Home / Self Care | Attending: Orthopedic Surgery

## 2020-08-07 ENCOUNTER — Ambulatory Visit (HOSPITAL_COMMUNITY): Payer: Medicaid Other | Admitting: Physician Assistant

## 2020-08-07 ENCOUNTER — Observation Stay (HOSPITAL_COMMUNITY)
Admission: RE | Admit: 2020-08-07 | Discharge: 2020-08-08 | Disposition: A | Discharge status: Home / Self Care | Payer: Medicaid Other | Attending: Orthopedic Surgery | Admitting: Orthopedic Surgery

## 2020-08-07 ENCOUNTER — Observation Stay (HOSPITAL_COMMUNITY): Payer: Medicaid Other

## 2020-08-07 ENCOUNTER — Ambulatory Visit (HOSPITAL_COMMUNITY): Payer: Medicaid Other | Admitting: Certified Registered"

## 2020-08-07 DIAGNOSIS — Z9889 Other specified postprocedural states: Secondary | ICD-10-CM

## 2020-08-07 DIAGNOSIS — Z79899 Other long term (current) drug therapy: Secondary | ICD-10-CM | POA: Insufficient documentation

## 2020-08-07 DIAGNOSIS — J45909 Unspecified asthma, uncomplicated: Secondary | ICD-10-CM | POA: Insufficient documentation

## 2020-08-07 DIAGNOSIS — M87052 Idiopathic aseptic necrosis of left femur: Secondary | ICD-10-CM | POA: Diagnosis present

## 2020-08-07 DIAGNOSIS — F17210 Nicotine dependence, cigarettes, uncomplicated: Secondary | ICD-10-CM | POA: Diagnosis not present

## 2020-08-07 DIAGNOSIS — M1712 Unilateral primary osteoarthritis, left knee: Secondary | ICD-10-CM | POA: Diagnosis present

## 2020-08-07 DIAGNOSIS — Z96652 Presence of left artificial knee joint: Secondary | ICD-10-CM

## 2020-08-07 DIAGNOSIS — M8700 Idiopathic aseptic necrosis of unspecified bone: Secondary | ICD-10-CM | POA: Diagnosis present

## 2020-08-07 HISTORY — PX: TOTAL KNEE ARTHROPLASTY: SHX125

## 2020-08-07 SURGERY — ARTHROPLASTY, KNEE, TOTAL
Anesthesia: Spinal | Site: Knee | Laterality: Left

## 2020-08-07 MED ORDER — HYDROMORPHONE HCL 1 MG/ML IJ SOLN
0.5000 mg | INTRAMUSCULAR | Status: DC | PRN
  Administered 2020-08-07: 1 mg via INTRAVENOUS
  Filled 2020-08-07: qty 1

## 2020-08-07 MED ORDER — METOCLOPRAMIDE HCL 5 MG/ML IJ SOLN: 5.0000 mg | Freq: Three times a day (TID) | INTRAMUSCULAR | Status: DC | PRN

## 2020-08-07 MED ORDER — ROSUVASTATIN CALCIUM 10 MG PO TABS
10.0000 mg | ORAL_TABLET | Freq: Every day | ORAL | Status: DC
  Administered 2020-08-07: 10 mg via ORAL
  Filled 2020-08-07: qty 1

## 2020-08-07 MED ORDER — SODIUM CHLORIDE 0.9 % IR SOLN
Status: DC | PRN
  Administered 2020-08-07: 3000 mL

## 2020-08-07 MED ORDER — HYDROXYZINE HCL 10 MG PO TABS: 10.0000 mg | ORAL_TABLET | ORAL | Status: DC

## 2020-08-07 MED ORDER — ACETAMINOPHEN 325 MG PO TABS
325.0000 mg | ORAL_TABLET | Freq: Four times a day (QID) | ORAL | Status: DC | PRN
Start: 2020-08-08 — End: 2020-08-08

## 2020-08-07 MED ORDER — MENTHOL 3 MG MT LOZG: 1.0000 | LOZENGE | OROMUCOSAL | Status: DC | PRN

## 2020-08-07 MED ORDER — FENTANYL CITRATE (PF) 100 MCG/2ML IJ SOLN
25.0000 ug | INTRAMUSCULAR | Status: DC | PRN
Start: 2020-08-07 — End: 2020-08-07

## 2020-08-07 MED ORDER — BUPIVACAINE LIPOSOME 1.3 % IJ SUSP
20.0000 mL | Freq: Once | INTRAMUSCULAR | Status: AC
  Administered 2020-08-07: 20 mL
  Filled 2020-08-07: qty 20

## 2020-08-07 MED ORDER — ORAL CARE MOUTH RINSE: 15.0000 mL | Freq: Once | OROMUCOSAL | Status: AC

## 2020-08-07 MED ORDER — METHOCARBAMOL 500 MG IVPB - SIMPLE MED
INTRAVENOUS | Status: AC
  Filled 2020-08-07: qty 50

## 2020-08-07 MED ORDER — DIPHENHYDRAMINE HCL 12.5 MG/5ML PO ELIX: 12.5000 mg | ORAL_SOLUTION | ORAL | Status: DC | PRN

## 2020-08-07 MED ORDER — HYDROXYZINE HCL 10 MG PO TABS
10.0000 mg | ORAL_TABLET | Freq: Every day | ORAL | Status: DC
  Administered 2020-08-08: 10 mg via ORAL
  Filled 2020-08-07: qty 1

## 2020-08-07 MED ORDER — BISACODYL 10 MG RE SUPP: 10.0000 mg | Freq: Every day | RECTAL | Status: DC | PRN

## 2020-08-07 MED ORDER — ALPRAZOLAM 1 MG PO TABS: 2.0000 mg | ORAL_TABLET | Freq: Three times a day (TID) | ORAL | Status: DC | PRN

## 2020-08-07 MED ORDER — METHOCARBAMOL 500 MG IVPB - SIMPLE MED
500.0000 mg | Freq: Four times a day (QID) | INTRAVENOUS | Status: DC | PRN
  Administered 2020-08-07: 500 mg via INTRAVENOUS
  Filled 2020-08-07: qty 50

## 2020-08-07 MED ORDER — CEFAZOLIN SODIUM-DEXTROSE 2-4 GM/100ML-% IV SOLN
2.0000 g | INTRAVENOUS | Status: AC
  Administered 2020-08-07: 2 g via INTRAVENOUS
  Filled 2020-08-07: qty 100

## 2020-08-07 MED ORDER — OXYCODONE HCL 5 MG PO TABS
ORAL_TABLET | ORAL | Status: AC
  Administered 2020-08-07: 5 mg via ORAL
  Filled 2020-08-07: qty 1

## 2020-08-07 MED ORDER — PHENYLEPHRINE HCL-NACL 20-0.9 MG/250ML-% IV SOLN
INTRAVENOUS | Status: DC | PRN
  Administered 2020-08-07: 25 ug/min via INTRAVENOUS

## 2020-08-07 MED ORDER — PHENYLEPHRINE HCL (PRESSORS) 10 MG/ML IV SOLN
INTRAVENOUS | Status: AC
  Filled 2020-08-07: qty 2

## 2020-08-07 MED ORDER — ROPIVACAINE HCL 7.5 MG/ML IJ SOLN
INTRAMUSCULAR | Status: DC | PRN
  Administered 2020-08-07: 20 mL via PERINEURAL

## 2020-08-07 MED ORDER — HYDROXYZINE HCL 10 MG PO TABS
10.0000 mg | ORAL_TABLET | Freq: Every day | ORAL | Status: DC | PRN
  Administered 2020-08-07 – 2020-08-08 (×2): 10 mg via ORAL
  Filled 2020-08-07 (×5): qty 1

## 2020-08-07 MED ORDER — 0.9 % SODIUM CHLORIDE (POUR BTL) OPTIME
TOPICAL | Status: DC | PRN
  Administered 2020-08-07: 1000 mL

## 2020-08-07 MED ORDER — OXYCODONE HCL 5 MG/5ML PO SOLN: 5.0000 mg | Freq: Once | ORAL | Status: AC | PRN

## 2020-08-07 MED ORDER — LACTATED RINGERS IV SOLN: INTRAVENOUS | Status: DC

## 2020-08-07 MED ORDER — ONDANSETRON HCL 4 MG/2ML IJ SOLN
INTRAMUSCULAR | Status: AC
  Filled 2020-08-07: qty 2

## 2020-08-07 MED ORDER — ACETAMINOPHEN 500 MG PO TABS
1000.0000 mg | ORAL_TABLET | Freq: Four times a day (QID) | ORAL | Status: AC
  Administered 2020-08-07 – 2020-08-08 (×4): 1000 mg via ORAL
  Filled 2020-08-07 (×4): qty 2

## 2020-08-07 MED ORDER — PROMETHAZINE HCL 25 MG/ML IJ SOLN
6.2500 mg | INTRAMUSCULAR | Status: DC | PRN
Start: 2020-08-07 — End: 2020-08-07

## 2020-08-07 MED ORDER — PHENOL 1.4 % MT LIQD: 1.0000 | OROMUCOSAL | Status: DC | PRN

## 2020-08-07 MED ORDER — PHENYLEPHRINE 40 MCG/ML (10ML) SYRINGE FOR IV PUSH (FOR BLOOD PRESSURE SUPPORT)
PREFILLED_SYRINGE | INTRAVENOUS | Status: DC | PRN
  Administered 2020-08-07 (×4): 80 ug via INTRAVENOUS

## 2020-08-07 MED ORDER — DEXAMETHASONE SODIUM PHOSPHATE 10 MG/ML IJ SOLN
8.0000 mg | Freq: Once | INTRAMUSCULAR | Status: AC
  Administered 2020-08-07: 10 mg via INTRAVENOUS

## 2020-08-07 MED ORDER — PANTOPRAZOLE SODIUM 40 MG PO TBEC
40.0000 mg | DELAYED_RELEASE_TABLET | Freq: Every day | ORAL | Status: DC
  Administered 2020-08-07 – 2020-08-08 (×2): 40 mg via ORAL
  Filled 2020-08-07 (×2): qty 1

## 2020-08-07 MED ORDER — ONDANSETRON HCL 4 MG/2ML IJ SOLN
INTRAMUSCULAR | Status: DC | PRN
  Administered 2020-08-07: 4 mg via INTRAVENOUS

## 2020-08-07 MED ORDER — ONDANSETRON HCL 4 MG PO TABS: 4.0000 mg | ORAL_TABLET | Freq: Four times a day (QID) | ORAL | Status: DC | PRN

## 2020-08-07 MED ORDER — CHLORHEXIDINE GLUCONATE CLOTH 2 % EX PADS: 6.0000 | MEDICATED_PAD | Freq: Every day | CUTANEOUS | Status: DC

## 2020-08-07 MED ORDER — OXYCODONE HCL 5 MG PO TABS
5.0000 mg | ORAL_TABLET | Freq: Once | ORAL | Status: AC | PRN
Start: 2020-08-07 — End: 2020-08-07

## 2020-08-07 MED ORDER — BUPIVACAINE IN DEXTROSE 0.75-8.25 % IT SOLN
INTRATHECAL | Status: DC | PRN
  Administered 2020-08-07: 1.8 mL via INTRATHECAL

## 2020-08-07 MED ORDER — DEXAMETHASONE SODIUM PHOSPHATE 10 MG/ML IJ SOLN
INTRAMUSCULAR | Status: AC
  Filled 2020-08-07: qty 1

## 2020-08-07 MED ORDER — CEFAZOLIN SODIUM-DEXTROSE 2-4 GM/100ML-% IV SOLN
2.0000 g | Freq: Four times a day (QID) | INTRAVENOUS | Status: AC
  Administered 2020-08-07 (×2): 2 g via INTRAVENOUS
  Filled 2020-08-07 (×2): qty 100

## 2020-08-07 MED ORDER — CHLORHEXIDINE GLUCONATE 0.12 % MT SOLN
15.0000 mL | Freq: Once | OROMUCOSAL | Status: AC
  Administered 2020-08-07: 15 mL via OROMUCOSAL

## 2020-08-07 MED ORDER — MIDAZOLAM HCL 5 MG/5ML IJ SOLN
INTRAMUSCULAR | Status: DC | PRN
  Administered 2020-08-07: 2 mg via INTRAVENOUS

## 2020-08-07 MED ORDER — POLYETHYLENE GLYCOL 3350 17 G PO PACK: 17.0000 g | PACK | Freq: Every day | ORAL | Status: DC | PRN

## 2020-08-07 MED ORDER — OXYCODONE HCL 5 MG PO TABS
10.0000 mg | ORAL_TABLET | ORAL | Status: DC | PRN
  Administered 2020-08-07 (×2): 10 mg via ORAL
  Administered 2020-08-08: 15 mg via ORAL
  Administered 2020-08-08 (×2): 10 mg via ORAL
  Filled 2020-08-07: qty 3
  Filled 2020-08-07 (×2): qty 2
  Filled 2020-08-07 (×2): qty 3

## 2020-08-07 MED ORDER — ASPIRIN 81 MG PO CHEW
81.0000 mg | CHEWABLE_TABLET | Freq: Two times a day (BID) | ORAL | Status: DC
  Administered 2020-08-07 – 2020-08-08 (×2): 81 mg via ORAL
  Filled 2020-08-07 (×2): qty 1

## 2020-08-07 MED ORDER — CELECOXIB 200 MG PO CAPS
400.0000 mg | ORAL_CAPSULE | Freq: Once | ORAL | Status: AC
  Administered 2020-08-07: 400 mg via ORAL
  Filled 2020-08-07: qty 2

## 2020-08-07 MED ORDER — FENTANYL CITRATE (PF) 100 MCG/2ML IJ SOLN
INTRAMUSCULAR | Status: DC | PRN
  Administered 2020-08-07 (×2): 50 ug via INTRAVENOUS

## 2020-08-07 MED ORDER — ACETAMINOPHEN 500 MG PO TABS
1000.0000 mg | ORAL_TABLET | Freq: Once | ORAL | Status: AC
  Administered 2020-08-07: 1000 mg via ORAL
  Filled 2020-08-07: qty 2

## 2020-08-07 MED ORDER — TRANEXAMIC ACID-NACL 1000-0.7 MG/100ML-% IV SOLN
1000.0000 mg | INTRAVENOUS | Status: DC
  Filled 2020-08-07: qty 100

## 2020-08-07 MED ORDER — ONDANSETRON HCL 4 MG/2ML IJ SOLN: 4.0000 mg | Freq: Four times a day (QID) | INTRAMUSCULAR | Status: DC | PRN

## 2020-08-07 MED ORDER — DOCUSATE SODIUM 100 MG PO CAPS
100.0000 mg | ORAL_CAPSULE | Freq: Two times a day (BID) | ORAL | Status: DC
  Administered 2020-08-07 – 2020-08-08 (×3): 100 mg via ORAL
  Filled 2020-08-07 (×3): qty 1

## 2020-08-07 MED ORDER — DOXEPIN HCL 50 MG PO CAPS
50.0000 mg | ORAL_CAPSULE | Freq: Every evening | ORAL | Status: DC | PRN
Start: 2020-08-07 — End: 2020-08-08
  Filled 2020-08-07: qty 2

## 2020-08-07 MED ORDER — ALUM & MAG HYDROXIDE-SIMETH 200-200-20 MG/5ML PO SUSP: 30.0000 mL | ORAL | Status: DC | PRN

## 2020-08-07 MED ORDER — METHOCARBAMOL 500 MG PO TABS
500.0000 mg | ORAL_TABLET | Freq: Four times a day (QID) | ORAL | Status: DC | PRN
  Administered 2020-08-07 – 2020-08-08 (×4): 500 mg via ORAL
  Filled 2020-08-07 (×4): qty 1

## 2020-08-07 MED ORDER — PROPOFOL 500 MG/50ML IV EMUL
INTRAVENOUS | Status: DC | PRN
  Administered 2020-08-07: 75 ug/kg/min via INTRAVENOUS

## 2020-08-07 MED ORDER — METOCLOPRAMIDE HCL 5 MG PO TABS
5.0000 mg | ORAL_TABLET | Freq: Three times a day (TID) | ORAL | Status: DC | PRN
Start: 2020-08-07 — End: 2020-08-08

## 2020-08-07 MED ORDER — MIDAZOLAM HCL 2 MG/2ML IJ SOLN
INTRAMUSCULAR | Status: AC
  Filled 2020-08-07: qty 2

## 2020-08-07 MED ORDER — TRAMADOL HCL 50 MG PO TABS
50.0000 mg | ORAL_TABLET | Freq: Four times a day (QID) | ORAL | Status: DC
  Administered 2020-08-07 – 2020-08-08 (×4): 50 mg via ORAL
  Filled 2020-08-07 (×4): qty 1

## 2020-08-07 MED ORDER — OXYCODONE HCL 5 MG PO TABS
5.0000 mg | ORAL_TABLET | ORAL | Status: DC | PRN
Start: 2020-08-07 — End: 2020-08-08
  Administered 2020-08-07: 5 mg via ORAL
  Filled 2020-08-07: qty 1

## 2020-08-07 MED ORDER — DEXAMETHASONE SODIUM PHOSPHATE 10 MG/ML IJ SOLN
10.0000 mg | Freq: Once | INTRAMUSCULAR | Status: AC
  Administered 2020-08-08: 10 mg via INTRAVENOUS
  Filled 2020-08-07: qty 1

## 2020-08-07 MED ORDER — LORATADINE 10 MG PO TABS: 10.0000 mg | ORAL_TABLET | Freq: Every evening | ORAL | Status: DC

## 2020-08-07 MED ORDER — TRANEXAMIC ACID-NACL 1000-0.7 MG/100ML-% IV SOLN
1000.0000 mg | Freq: Once | INTRAVENOUS | Status: AC
  Administered 2020-08-07: 1000 mg via INTRAVENOUS
  Filled 2020-08-07: qty 100

## 2020-08-07 MED ORDER — SODIUM CHLORIDE (PF) 0.9 % IJ SOLN
INTRAMUSCULAR | Status: AC
  Filled 2020-08-07: qty 10

## 2020-08-07 MED ORDER — FENTANYL CITRATE (PF) 100 MCG/2ML IJ SOLN
INTRAMUSCULAR | Status: AC
  Filled 2020-08-07: qty 2

## 2020-08-07 MED ORDER — MAGNESIUM CITRATE PO SOLN: 1.0000 | Freq: Once | ORAL | Status: DC | PRN

## 2020-08-07 MED ORDER — ACETAMINOPHEN 500 MG PO TABS: 1000.0000 mg | ORAL_TABLET | Freq: Four times a day (QID) | ORAL | Status: DC | PRN

## 2020-08-07 SURGICAL SUPPLY — 45 items
BLADE HEX COATED 2.75 (ELECTRODE) ×2 IMPLANT
BLADE SAG 18X100X1.27 (BLADE) ×2 IMPLANT
BLADE SAGITTAL 25.0X1.37X90 (BLADE) ×2 IMPLANT
BLADE SURG 15 STRL LF DISP TIS (BLADE) ×1 IMPLANT
BLADE SURG 15 STRL SS (BLADE) ×2
BLADE SURG SZ10 CARB STEEL (BLADE) ×4 IMPLANT
BNDG CMPR MED 10X6 ELC LF (GAUZE/BANDAGES/DRESSINGS) ×1
BNDG ELASTIC 6X10 VLCR STRL LF (GAUZE/BANDAGES/DRESSINGS) ×2 IMPLANT
BSPLAT TIB 3 KN TRITANIUM (Knees) ×1 IMPLANT
CLSR STERI-STRIP ANTIMIC 1/2X4 (GAUZE/BANDAGES/DRESSINGS) ×2 IMPLANT
COMP FEMORAL CEMNTLESS SZ2 TRI (Joint) ×2 IMPLANT
COMPONENT FEMRL CMNTLS SZ2 TRI (Joint) IMPLANT
COVER SURGICAL LIGHT HANDLE (MISCELLANEOUS) ×2 IMPLANT
CUFF TOURN SGL QUICK 34 (TOURNIQUET CUFF) ×2
CUFF TRNQT CYL 34X4.125X (TOURNIQUET CUFF) ×1 IMPLANT
DECANTER SPIKE VIAL GLASS SM (MISCELLANEOUS) ×2 IMPLANT
DRAPE U-SHAPE 47X51 STRL (DRAPES) ×2 IMPLANT
DRSG AQUACEL AG ADV 3.5X10 (GAUZE/BANDAGES/DRESSINGS) ×2 IMPLANT
DRSG MEPILEX BORDER 4X8 (GAUZE/BANDAGES/DRESSINGS) ×1 IMPLANT
DURAPREP 26ML APPLICATOR (WOUND CARE) ×4 IMPLANT
GLOVE SRG 8 PF TXTR STRL LF DI (GLOVE) ×1 IMPLANT
GLOVE SURG ENC MOIS LTX SZ7.5 (GLOVE) ×4 IMPLANT
GLOVE SURG UNDER POLY LF SZ7.5 (GLOVE) ×2 IMPLANT
GLOVE SURG UNDER POLY LF SZ8 (GLOVE) ×2
GOWN STRL REUS W/ TWL LRG LVL3 (GOWN DISPOSABLE) ×2 IMPLANT
GOWN STRL REUS W/TWL LRG LVL3 (GOWN DISPOSABLE) ×4
HANDPIECE INTERPULSE COAX TIP (DISPOSABLE) ×2
IMMOBILIZER KNEE 22 UNIV (SOFTGOODS) ×2 IMPLANT
INSERT TIB CS TRIATH X3 9 (Insert) ×2 IMPLANT
KIT TURNOVER KIT A (KITS) ×2 IMPLANT
KNEE PATELLA ASYMMETRIC 9X29 (Knees) ×2 IMPLANT
KNEE TIBIAL COMPONENT SZ3 (Knees) ×1 IMPLANT
MANIFOLD NEPTUNE II (INSTRUMENTS) ×2 IMPLANT
NS IRRIG 1000ML POUR BTL (IV SOLUTION) ×2 IMPLANT
PACK ICE MAXI GEL EZY WRAP (MISCELLANEOUS) ×2 IMPLANT
PACK TOTAL KNEE CUSTOM (KITS) ×2 IMPLANT
PIN FLUTED HEDLESS FIX 3.5X1/8 (PIN) ×1 IMPLANT
PROTECTOR NERVE ULNAR (MISCELLANEOUS) ×2 IMPLANT
SET HNDPC FAN SPRY TIP SCT (DISPOSABLE) ×1 IMPLANT
SUT MNCRL AB 3-0 PS2 18 (SUTURE) ×2 IMPLANT
SUT VIC AB 0 CT1 36 (SUTURE) ×2 IMPLANT
SUT VIC AB 1 CT1 36 (SUTURE) ×4 IMPLANT
SUT VIC AB 2-0 CT1 27 (SUTURE) ×2
SUT VIC AB 2-0 CT1 TAPERPNT 27 (SUTURE) ×1 IMPLANT
TUBE SUCTION HIGH CAP CLEAR NV (SUCTIONS) ×2 IMPLANT

## 2020-08-07 NOTE — Anesthesia Postprocedure Evaluation (Signed)
Anesthesia Post Note  Patient: Diane Stein  Procedure(s) Performed: TOTAL KNEE ARTHROPLASTY (Left Knee)     Patient location during evaluation: PACU Anesthesia Type: Spinal Level of consciousness: awake and alert Pain management: pain level controlled Vital Signs Assessment: post-procedure vital signs reviewed and stable Respiratory status: spontaneous breathing and respiratory function stable Cardiovascular status: blood pressure returned to baseline and stable Postop Assessment: spinal receding and no apparent nausea or vomiting Anesthetic complications: no   No complications documented.  Last Vitals:  Vitals:   08/07/20 1030 08/07/20 1050  BP: 98/64 115/71  Pulse: 77 94  Resp: 16 15  Temp: 36.5 C 36.6 C  SpO2: 98% 100%    Last Pain:  Vitals:   08/07/20 1050  TempSrc: Oral  PainSc: 5                  Beryle Lathe

## 2020-08-07 NOTE — Anesthesia Procedure Notes (Signed)
Spinal  Patient location during procedure: OR Start time: 08/07/2020 7:28 AM End time: 08/07/2020 7:31 AM Staffing Performed: anesthesiologist  Anesthesiologist: Beryle Lathe, MD Preanesthetic Checklist Completed: patient identified, IV checked, risks and benefits discussed, surgical consent, monitors and equipment checked, pre-op evaluation and timeout performed Spinal Block Patient position: sitting Prep: DuraPrep Patient monitoring: heart rate, cardiac monitor, continuous pulse ox and blood pressure Approach: midline Location: L3-4 Injection technique: single-shot Needle Needle type: Pencan  Needle gauge: 24 G Additional Notes Consent was obtained prior to the procedure with all questions answered and concerns addressed. Risks including, but not limited to, bleeding, infection, nerve damage, paralysis, failed block, inadequate analgesia, allergic reaction, high spinal, itching, and headache were discussed and the patient wished to proceed. Functioning IV was confirmed and monitors were applied. Sterile prep and drape, including hand hygiene, mask, and sterile gloves were used. The patient was positioned and the spine was prepped. The skin was anesthetized with lidocaine. Free flow of clear CSF was obtained prior to injecting local anesthetic into the CSF. The spinal needle aspirated freely following injection. The needle was carefully withdrawn. The patient tolerated the procedure well.   Leslye Peer, MD

## 2020-08-07 NOTE — Anesthesia Procedure Notes (Signed)
Anesthesia Regional Block: Adductor canal block   Pre-Anesthetic Checklist: ,, timeout performed, Correct Patient, Correct Site, Correct Laterality, Correct Procedure, Correct Position, site marked, Risks and benefits discussed,  Surgical consent,  Pre-op evaluation,  At surgeon's request and post-op pain management  Laterality: Left  Prep: chloraprep       Needles:  Injection technique: Single-shot  Needle Type: Echogenic Needle     Needle Length: 10cm  Needle Gauge: 21     Additional Needles:   Narrative:  Start time: 08/07/2020 6:56 AM End time: 08/07/2020 7:00 AM Injection made incrementally with aspirations every 5 mL.  Performed by: Personally  Anesthesiologist: Beryle Lathe, MD  Additional Notes: No pain on injection. No increased resistance to injection. Injection made in 5cc increments. Good needle visualization. Patient tolerated the procedure well.

## 2020-08-07 NOTE — Anesthesia Preprocedure Evaluation (Addendum)
Anesthesia Evaluation  Patient identified by MRN, date of birth, ID band Patient awake    Reviewed: Allergy & Precautions, NPO status , Patient's Chart, lab work & pertinent test results  History of Anesthesia Complications Negative for: history of anesthetic complications  Airway Mallampati: II  TM Distance: >3 FB Neck ROM: Full    Dental  (+) Dental Advisory Given, Teeth Intact   Pulmonary asthma , Current Smoker and Patient abstained from smoking.,    Pulmonary exam normal        Cardiovascular + dysrhythmias Supra Ventricular Tachycardia  Rhythm:Regular Rate:Tachycardia     Neuro/Psych PSYCHIATRIC DISORDERS Anxiety Depression negative neurological ROS     GI/Hepatic negative GI ROS, Neg liver ROS,   Endo/Other   Obesity   Renal/GU negative Renal ROS     Musculoskeletal  (+) Arthritis ,   Abdominal   Peds  Hematology negative hematology ROS (+)  Plt 310k    Anesthesia Other Findings Covid test negative   Reproductive/Obstetrics  s/p hysterectomy                             Anesthesia Physical Anesthesia Plan  ASA: II  Anesthesia Plan: Spinal   Post-op Pain Management:  Regional for Post-op pain   Induction:   PONV Risk Score and Plan: 2 and Treatment may vary due to age or medical condition and Propofol infusion  Airway Management Planned: Natural Airway and Simple Face Mask  Additional Equipment: None  Intra-op Plan:   Post-operative Plan:   Informed Consent: I have reviewed the patients History and Physical, chart, labs and discussed the procedure including the risks, benefits and alternatives for the proposed anesthesia with the patient or authorized representative who has indicated his/her understanding and acceptance.       Plan Discussed with: CRNA and Anesthesiologist  Anesthesia Plan Comments: (. )       Anesthesia Quick Evaluation

## 2020-08-07 NOTE — Discharge Instructions (Signed)
You may bear weight as tolerated. Keep your dressing on and dry until follow up. Take medicine to prevent blood clots as directed. Take pain medicine as needed with the goal of transitioning to over the counter medicines.  If needed, you may increase breakthrough pain medication (oxycodone) for the first few days post op - up to 2 tablets every 4 hours.  Stop this medication as soon as you are able.  INSTRUCTIONS AFTER JOINT REPLACEMENT   o Remove items at home which could result in a fall. This includes throw rugs or furniture in walking pathways o ICE to the affected joint every three hours while awake for 30 minutes at a time, for at least the first 3-5 days, and then as needed for pain and swelling.  Continue to use ice for pain and swelling. You may notice swelling that will progress down to the foot and ankle.  This is normal after surgery.  Elevate your leg when you are not up walking on it.   o Continue to use the breathing machine you got in the hospital (incentive spirometer) which will help keep your temperature down.  It is common for your temperature to cycle up and down following surgery, especially at night when you are not up moving around and exerting yourself.  The breathing machine keeps your lungs expanded and your temperature down.   DIET:  As you were doing prior to hospitalization, we recommend a well-balanced diet.  DRESSING / WOUND CARE / SHOWERING  You may shower 3 days after surgery, but keep the dressing dry during showering.  You may use an occlusive plastic wrap (Press'n Seal for example) with blue painter's tape at edges to cover the dressing, NO SOAKING/SUBMERGING IN THE BATHTUB.  If the bandage gets wet, change with a clean dry gauze.  If the incision gets wet, pat the wound dry with a clean towel and call the office.  ACTIVITY  o Increase activity slowly as tolerated, but follow the weight bearing instructions below.   o No driving for 6 weeks or until further  direction given by your physician.  You cannot drive while taking narcotics.  o No lifting or carrying greater than 10 lbs. until further directed by your surgeon. o Avoid periods of inactivity such as sitting longer than an hour when not asleep. This helps prevent blood clots.  o You may return to work once you are authorized by your doctor.     WEIGHT BEARING   Weight bearing as tolerated with assist device (walker, cane, etc) as directed, use it as long as suggested by your surgeon or therapist, typically at least 4-6 weeks.   EXERCISES  Results after joint replacement surgery are often greatly improved when you follow the exercise, range of motion and muscle strengthening exercises prescribed by your doctor. Safety measures are also important to protect the joint from further injury. Any time any of these exercises cause you to have increased pain or swelling, decrease what you are doing until you are comfortable again and then slowly increase them. If you have problems or questions, call your caregiver or physical therapist for advice.   Rehabilitation is important following a joint replacement. After just a few days of immobilization, the muscles of the leg can become weakened and shrink (atrophy).  These exercises are designed to build up the tone and strength of the thigh and leg muscles and to improve motion. Often times heat used for twenty to thirty minutes before working out  will loosen up your tissues and help with improving the range of motion but do not use heat for the first two weeks following surgery (sometimes heat can increase post-operative swelling).   These exercises can be done on a training (exercise) mat, on the floor, on a table or on a bed. Use whatever works the best and is most comfortable for you.    Use music or television while you are exercising so that the exercises are a pleasant break in your day. This will make your life better with the exercises acting as a  break in your routine that you can look forward to.   Perform all exercises about fifteen times, three times per day or as directed.  You should exercise both the operative leg and the other leg as well.  Exercises include:   . Quad Sets - Tighten up the muscle on the front of the thigh (Quad) and hold for 5-10 seconds.   . Straight Leg Raises - With your knee straight (if you were given a brace, keep it on), lift the leg to 60 degrees, hold for 3 seconds, and slowly lower the leg.  Perform this exercise against resistance later as your leg gets stronger.  . Leg Slides: Lying on your back, slowly slide your foot toward your buttocks, bending your knee up off the floor (only go as far as is comfortable). Then slowly slide your foot back down until your leg is flat on the floor again.  . Angel Wings: Lying on your back spread your legs to the side as far apart as you can without causing discomfort.  . Hamstring Strength:  Lying on your back, push your heel against the floor with your leg straight by tightening up the muscles of your buttocks.  Repeat, but this time bend your knee to a comfortable angle, and push your heel against the floor.  You may put a pillow under the heel to make it more comfortable if necessary.   A rehabilitation program following joint replacement surgery can speed recovery and prevent re-injury in the future due to weakened muscles. Contact your doctor or a physical therapist for more information on knee rehabilitation.    CONSTIPATION  Constipation is defined medically as fewer than three stools per week and severe constipation as less than one stool per week.  Even if you have a regular bowel pattern at home, your normal regimen is likely to be disrupted due to multiple reasons following surgery.  Combination of anesthesia, postoperative narcotics, change in appetite and fluid intake all can affect your bowels.   YOU MUST use at least one of the following options; they are  listed in order of increasing strength to get the job done.  They are all available over the counter, and you may need to use some, POSSIBLY even all of these options:    Drink plenty of fluids (prune juice may be helpful) and high fiber foods Colace 100 mg by mouth twice a day  Senokot for constipation as directed and as needed Dulcolax (bisacodyl), take with full glass of water  Miralax (polyethylene glycol) once or twice a day as needed.  If you have tried all these things and are unable to have a bowel movement in the first 3-4 days after surgery call either your surgeon or your primary doctor.    If you experience loose stools or diarrhea, hold the medications until you stool forms back up.  If your symptoms do not get better   within 1 week or if they get worse, check with your doctor.  If you experience "the worst abdominal pain ever" or develop nausea or vomiting, please contact the office immediately for further recommendations for treatment.   ITCHING:  If you experience itching with your medications, try taking only a single pain pill, or even half a pain pill at a time.  You can also use Benadryl over the counter for itching or also to help with sleep.   TED HOSE STOCKINGS:  Use stockings on both legs until for at least 2 weeks or as directed by physician office. They may be removed at night for sleeping.  MEDICATIONS:  See your medication summary on the "After Visit Summary" that nursing will review with you.  You may have some home medications which will be placed on hold until you complete the course of blood thinner medication.  It is important for you to complete the blood thinner medication as prescribed.  - Tylenol is for mild to moderate pain.  - Oxycodone is for severe pain.  - Celebrex is for pain and inflammation.  - Robaxin is for muscle spasms.  - Zofran is for nausea and vomiting.  - Omeprazole is for gastric protection while taking pain medicines.   - Aspirin is to  prevent blood clots after surgery.   PRECAUTIONS:  If you experience chest pain or shortness of breath - call 911 immediately for transfer to the hospital emergency department.   If you develop a fever greater that 101 F, purulent drainage from wound, increased redness or drainage from wound, foul odor from the wound/dressing, or calf pain - CONTACT YOUR SURGEON.                                                   FOLLOW-UP APPOINTMENTS:  If you do not already have a post-op appointment, please call the office for an appointment to be seen by your surgeon in 7-10 days.    OTHER INSTRUCTIONS:  Dental Antibiotics:  Contact your surgeon for an antibiotic prescription, prior to your dental procedure for 2 years after surgery.   MAKE SURE YOU:  . Understand these instructions.  . Get help right away if you are not doing well or get worse.    Thank you for letting us be a part of your medical care team.  It is a privilege we respect greatly.  We hope these instructions will help you stay on track for a fast and full recovery!

## 2020-08-07 NOTE — Op Note (Signed)
DATE OF SURGERY:  08/07/2020 TIME: 8:47 AM  PATIENT NAME:  Diane Stein   AGE: 35 y.o.    PRE-OPERATIVE DIAGNOSIS:  DEGENERATIVE JOINT DISEASE  LEFT KNEE  POST-OPERATIVE DIAGNOSIS:  Same  PROCEDURE:  Procedure(s): TOTAL KNEE ARTHROPLASTY   SURGEON:  Sheral Apley, MD   ASSISTANT:  Levester Fresh, PA-C, he was present and scrubbed throughout the case, critical for completion in a timely fashion, and for retraction, instrumentation, and closure.    OPERATIVE IMPLANTS: [employer] Triathlon CR. Press fit knee  Femur size 2, Tibia size 3, Patella size 29 3-peg oval button, with a 9 mm polyethylene insert.   PREOPERATIVE INDICATIONS:  Diane Stein is a 35 y.o. year old female with end stage bone on bone degenerative arthritis of the knee who failed conservative treatment, including injections, antiinflammatories, activity modification, and assistive devices, and had significant impairment of their activities of daily living, and elected for Total Knee Arthroplasty.   The risks, benefits, and alternatives were discussed at length including but not limited to the risks of infection, bleeding, nerve injury, stiffness, blood clots, the need for revision surgery, cardiopulmonary complications, among others, and they were willing to proceed.   OPERATIVE DESCRIPTION:  The patient was brought to the operative room and placed in a supine position.  General anesthesia was administered.  IV antibiotics were given.  The lower extremity was prepped and draped in the usual sterile fashion.  Time out was performed.  The leg was elevated and exsanguinated and the tourniquet was inflated.  Anterior approach was performed.  The patella was everted and osteophytes were removed.  The anterior horn of the medial and lateral meniscus was removed.   The distal femur was opened with the drill and the intramedullary distal femoral cutting jig was utilized, set at 5 degrees resecting 8 mm off the  distal femur.  Care was taken to protect the collateral ligaments.  The distal femoral sizing jig was applied, taking care to avoid notching.  Then the 4-in-1 cutting jig was applied and the anterior and posterior femur was cut, along with the chamfer cuts.  All posterior osteophytes were removed.  The flexion gap was then measured and was symmetric with the extension gap.  Then the extramedullary tibial cutting jig was utilized making the appropriate cut using the anterior tibial crest as a reference building in appropriate posterior slope.  Care was taken during the cut to protect the medial and collateral ligaments.  The proximal tibia was removed along with the posterior horns of the menisci.  The PCL was sacrificed.    The extensor gap was measured and was approximately 37mm.    I completed the distal femoral preparation using the appropriate jig to prepare the box.  The patella was then measured, and cut with the saw.    The proximal tibia sized and prepared accordingly with the reamer and the punch, and then all components were trialed with the above sized poly insert.  The knee was found to have excellent balance and full motion.    The above named components were then impacted into place and Poly tibial piece and patella were inserted.  I was very happy with his stability and ROM  I performed a periarticular injection with marcaine and toradol  The knee was easily taken through a range of motion and the patella tracked well and the knee irrigated copiously and the parapatellar and subcutaneous tissue closed with vicryl, and monocryl with steri strips for the  skin.  The incision was dressed with sterile gauze and the tourniquet released and the patient was awakened and returned to the PACU in stable and satisfactory condition.  There were no complications.  Total tourniquet time was roughly 75 minutes.   POSTOPERATIVE PLAN: post op Abx, DVT px: SCD's, TED's, Early ambulation and  chemical px

## 2020-08-07 NOTE — Evaluation (Signed)
Physical Therapy Evaluation Patient Details Name: Diane Stein MRN: [id number] DOB: [birth year] Today's Date: 08/07/2020   History of Present Illness  Patient is 35 y.o. female s/p Lt TKA on 08/07/20 with PMH significant for tachycardia, depression, anxiety, OA, AVN of Lt knee.  Clinical Impression  Pt is a 34y.o. female s/p Lt TKA POD 0. Pt reports that she is independent with mobility at baseline. Pt required MIN guard with cues for safe hand placement for sit to stand transfer. Pt required MIN assist progressing to MIN guard for safety with ambulation ~100ft with verbal cues for RW management and step to gait pattern with no LOB. PT reviewed therapeutic intervention for promotion of DVT prevention, pt demonstrated understanding. Pt will have assistance from her sister and parents upon discharge. Pt will benefit from skilled PT to increase independence and safety with mobility. Acute therapy to follow up during stay to progress functional mobility as able to ensure safe discharge home.       Follow Up Recommendations Outpatient PT;Follow surgeon's recommendation for DC plan and follow-up therapies    Equipment Recommendations  None recommended by PT (pt owns RW)    Recommendations for Other Services       Precautions / Restrictions Precautions Precautions: Fall Restrictions Weight Bearing Restrictions: No Other Position/Activity Restrictions: WBAT      Mobility  Bed Mobility Overal bed mobility: Needs Assistance Bed Mobility: Supine to Sit     Supine to sit: Supervision;HOB elevated     General bed mobility comments: use of B UEs to scoot to EOB wtih supervision for safety    Transfers Overall transfer level: Needs assistance Equipment used: Rolling walker (2 wheeled) Transfers: Sit to/from Stand Sit to Stand: Min guard         General transfer comment: MIN guard for safety with cues for safe hand placement  Ambulation/Gait Ambulation/Gait assistance: Min  assist;Min guard Gait Distance (Feet): 44 Feet Assistive device: Rolling walker (2 wheeled) Gait Pattern/deviations: Step-to pattern;Decreased stride length;Decreased weight shift to left     General Gait Details: Pt perfomed pre gait marching with use of B UEs on RW with no knee buckling. MIN assist progressing to MIN gaurd for safety with cues for step to gait pattern with no LOB.  Stairs            Wheelchair Mobility    Modified Rankin (Stroke Patients Only)       Balance Overall balance assessment: Needs assistance Sitting-balance support: Feet supported Sitting balance-Leahy Scale: Good     Standing balance support: Bilateral upper extremity supported;During functional activity Standing balance-Leahy Scale: Poor Standing balance comment: use of RW for standing balance                             Pertinent Vitals/Pain Pain Assessment: 0-10 Pain Score: 6  Pain Location: Lt knee Pain Descriptors / Indicators: Burning;Throbbing Pain Intervention(s): Limited activity within patient's tolerance;Monitored during session;Repositioned;Ice applied    Home Living Family/patient expects to be discharged to:: Private residence Living Arrangements: Parent;Other relatives Available Help at Discharge: Family Type of Home: House Home Access: Stairs to enter Entrance Stairs-Rails: None Entrance Stairs-Number of Steps: 3 Home Layout: One level Home Equipment: Walker - 2 wheels;Shower seat;Bedside commode Additional Comments: pt will have assist from her sister and parents    Prior Function Level of Independence: Independent               Hand Dominance  Dominant Hand: Right    Extremity/Trunk Assessment   Upper Extremity Assessment Upper Extremity Assessment: Overall WFL for tasks assessed    Lower Extremity Assessment Lower Extremity Assessment: LLE deficits/detail LLE Deficits / Details: pt with good quad set strength and 4+/5 B dorsi/plantar  flexion strength. Pt able to complete full SLR with no extesnor lag LLE Sensation: WNL LLE Coordination: WNL    Cervical / Trunk Assessment Cervical / Trunk Assessment: Normal  Communication   Communication: No difficulties  Cognition Arousal/Alertness: Awake/alert Behavior During Therapy: WFL for tasks assessed/performed Overall Cognitive Status: Within Functional Limits for tasks assessed                                        General Comments      Exercises Total Joint Exercises Ankle Circles/Pumps: AROM;Both;20 reps;Seated   Assessment/Plan    PT Assessment Patient needs continued PT services  PT Problem List Decreased strength;Decreased range of motion;Decreased activity tolerance;Decreased balance;Decreased mobility;Decreased knowledge of use of DME;Pain       PT Treatment Interventions DME instruction;Gait training;Stair training;Functional mobility training;Therapeutic activities;Therapeutic exercise;Balance training;Patient/family education    PT Goals (Current goals can be found in the Care Plan section)  Acute Rehab PT Goals Patient Stated Goal: play on trampoline with daughter PT Goal Formulation: With patient Time For Goal Achievement: 08/14/20 Potential to Achieve Goals: Good    Frequency 7X/week   Barriers to discharge        Co-evaluation               AM-PAC PT "6 Clicks" Mobility  Outcome Measure Help needed turning from your back to your side while in a flat bed without using bedrails?: None Help needed moving from lying on your back to sitting on the side of a flat bed without using bedrails?: None Help needed moving to and from a bed to a chair (including a wheelchair)?: A Little Help needed standing up from a chair using your arms (e.g., wheelchair or bedside chair)?: A Little Help needed to walk in hospital room?: A Little Help needed climbing 3-5 steps with a railing? : A Lot 6 Click Score: 19    End of Session  Equipment Utilized During Treatment: Gait belt Activity Tolerance: Patient tolerated treatment well;Patient limited by pain Patient left: in chair;with call bell/phone within reach;with chair alarm set Nurse Communication: Mobility status PT Visit Diagnosis: Unsteadiness on feet (R26.81);Muscle weakness (generalized) (M62.81);Pain Pain - Right/Left: Left Pain - part of body: Knee    Time: 1237-1259 PT Time Calculation (min) (ACUTE ONLY): 22 min   Charges:            Diane Stein, SPT  Acute rehab    Diane Stein 08/07/2020, 1:43 PM

## 2020-08-07 NOTE — Interval H&P Note (Signed)
History and Physical Interval Note:  08/07/2020 7:14 AM  Diane Stein  has presented today for surgery, with the diagnosis of DEGENERATIVE JOINT DISEASE  LEFT KNEE.  The various methods of treatment have been discussed with the patient and family. After consideration of risks, benefits and other options for treatment, the patient has consented to  Procedure(s): TOTAL KNEE ARTHROPLASTY (Left) as a surgical intervention.  The patient's history has been reviewed, patient examined, no change in status, stable for surgery.  I have reviewed the patient's chart and labs.  Questions were answered to the patient's satisfaction.     Sheral Apley

## 2020-08-07 NOTE — Transfer of Care (Signed)
Immediate Anesthesia Transfer of Care Note  Patient: Diane Stein  Procedure(s) Performed: TOTAL KNEE ARTHROPLASTY (Left Knee)  Patient Location: PACU  Anesthesia Type:Regional, Spinal and MAC combined with regional for post-op pain  Level of Consciousness: awake, alert , oriented and patient cooperative  Airway & Oxygen Therapy: Patient Spontanous Breathing and Patient connected to face mask oxygen  Post-op Assessment: Report given to RN and Post -op Vital signs reviewed and stable  Post vital signs: stable  Last Vitals:  Vitals Value Taken Time  BP 99/52 08/07/20 0933  Temp    Pulse 87 08/07/20 0935  Resp 13 08/07/20 0935  SpO2 100 % 08/07/20 0935  Vitals shown include unvalidated device data.  Last Pain:  Vitals:   08/07/20 0648  TempSrc: Oral         Complications: No complications documented.

## 2020-08-08 ENCOUNTER — Encounter (HOSPITAL_COMMUNITY): Payer: Self-pay | Admitting: Orthopedic Surgery

## 2020-08-08 DIAGNOSIS — M1712 Unilateral primary osteoarthritis, left knee: Secondary | ICD-10-CM | POA: Diagnosis not present

## 2020-08-08 MED ORDER — ASPIRIN EC 81 MG PO TBEC: 81.0000 mg | DELAYED_RELEASE_TABLET | Freq: Two times a day (BID) | ORAL | 0 refills | Status: DC

## 2020-08-08 MED ORDER — OXYCODONE HCL 5 MG PO TABS: 5.0000 mg | ORAL_TABLET | Freq: Four times a day (QID) | ORAL | 0 refills | Status: DC | PRN

## 2020-08-08 MED ORDER — OMEPRAZOLE MAGNESIUM 20 MG PO TBEC: 20.0000 mg | DELAYED_RELEASE_TABLET | Freq: Every day | ORAL | 0 refills | Status: AC

## 2020-08-08 MED ORDER — METHOCARBAMOL 500 MG PO TABS: 500.0000 mg | ORAL_TABLET | Freq: Three times a day (TID) | ORAL | 0 refills | Status: DC | PRN

## 2020-08-08 MED ORDER — ACETAMINOPHEN 500 MG PO TABS: 500.0000 mg | ORAL_TABLET | Freq: Four times a day (QID) | ORAL | 0 refills | Status: AC | PRN

## 2020-08-08 MED ORDER — ONDANSETRON HCL 4 MG PO TABS: 4.0000 mg | ORAL_TABLET | Freq: Every day | ORAL | 0 refills | Status: DC | PRN

## 2020-08-08 MED ORDER — CELECOXIB 100 MG PO CAPS: 100.0000 mg | ORAL_CAPSULE | Freq: Two times a day (BID) | ORAL | 0 refills | Status: AC

## 2020-08-08 NOTE — Progress Notes (Signed)
Reviewed written d/c instructions w pt and all questions answered. Pt verb. understanding. D/c via w/c w all belongings in stable condition.

## 2020-08-08 NOTE — TOC Transition Note (Signed)
Transition of Care [facility]) - CM/SW Discharge Note  Patient Details  Name: Diane Stein MRN: [id number] Date of Birth: 12/14/1985  Transition of Care [facility]) CM/SW Contact:  Ewing Schlein, LCSW Phone Number: 08/08/2020, 11:49 AM  Clinical Narrative: Patient ready for discharge. CSW spoke with patient and confirmed she does not have any DME needs as she has a rolling walker at home. Patient to complete OPPT. TOC signing off.  Final next level of care: OP Rehab Barriers to Discharge: Barriers Resolved  Patient Goals and CMS Choice Patient states their goals for this hospitalization and ongoing recovery are:: Discharge home CMS Medicare.gov Compare Post Acute Care list provided to:: Patient Choice offered to / list presented to : Patient  Discharge Plan and Services        DME Arranged: N/A DME Agency: NA  Readmission Risk Interventions No flowsheet data found.

## 2020-08-08 NOTE — Evaluation (Signed)
Occupational Therapy Evaluation Patient Details Name: Diane Stein MRN: [id number] DOB: July 19, 1985 Today's Date: 08/08/2020    History of Present Illness Patient is 35 y.o. female s/p Lt TKA on 08/07/20 with PMH significant for tachycardia, depression, anxiety, OA, AVN of Lt knee.   Clinical Impression   Patient lives with family in single level house with 3 steps to enter. Patient fully I at baseline. Patient demonstrates functional ambulation with walker, toilet transfer, UB/LB dressing without any physical assistance just min cue for sequencing LB dressing. Patient reports she is supposed to go home soon and has no further concerns re: self care at home. Will discontinue acute OT services at this time. Please re-consult if new needs arise.      Follow Up Recommendations  No OT follow up    Equipment Recommendations  None recommended by OT       Precautions / Restrictions Precautions Precautions: Fall Restrictions Weight Bearing Restrictions: No Other Position/Activity Restrictions: WBAT      Mobility Bed Mobility Overal bed mobility: Modified Independent             General bed mobility comments: in recliner    Transfers Overall transfer level: Modified independent Equipment used: Rolling walker (2 wheeled) Transfers: Sit to/from Stand Sit to Stand: Modified independent (Device/Increase time)         General transfer comment: BUE assisting to power up with LLE extended, good steadiness upon rising    Balance Overall balance assessment: Needs assistance Sitting-balance support: Feet supported Sitting balance-Leahy Scale: Good     Standing balance support: During functional activity;No upper extremity supported Standing balance-Leahy Scale: Fair Standing balance comment: static without UE support, dynamic with RW                           ADL either performed or assessed with clinical judgement   ADL Overall ADL's : Modified  independent;At baseline                                       General ADL Comments: patient able to ambulate to bathroom with walker, perform clothing management and peri care as well as UB dressing/LB dressing without physical assistance. Cued patient to thread L LE first when dressing, pt return demo with adequate technique.                  Pertinent Vitals/Pain Pain Assessment: Faces Pain Score: 7  Faces Pain Scale: Hurts little more Pain Location: Lt knee Pain Descriptors / Indicators: Aching Pain Intervention(s): Monitored during session     Hand Dominance Right   Extremity/Trunk Assessment Upper Extremity Assessment Upper Extremity Assessment: Overall WFL for tasks assessed   Lower Extremity Assessment Lower Extremity Assessment: Defer to PT evaluation   Cervical / Trunk Assessment Cervical / Trunk Assessment: Normal   Communication Communication Communication: No difficulties   Cognition Arousal/Alertness: Awake/alert Behavior During Therapy: WFL for tasks assessed/performed Overall Cognitive Status: Within Functional Limits for tasks assessed                                                Home Living Family/patient expects to be discharged to:: Private residence Living Arrangements: Parent;Other relatives Available Help at Discharge: Family Type  of Home: House Home Access: Stairs to enter [employer] of Steps: 3 Entrance Stairs-Rails: None Home Layout: One level     Bathroom Shower/Tub: Chief Strategy Officer: Standard Bathroom Accessibility: Yes   Home Equipment: Environmental consultant - 2 wheels;Shower seat;Bedside commode;Hand held shower head   Additional Comments: pt will have assist from her sister and parents      Prior Functioning/Environment Level of Independence: Independent                 OT Problem List: Pain         OT Goals(Current goals can be found in the care plan section)  Acute Rehab OT Goals Patient Stated Goal: play on trampoline with daughter OT Goal Formulation: All assessment and education complete, DC therapy   AM-PAC OT "6 Clicks" Daily Activity     Outcome Measure Help from another person eating meals?: None Help from another person taking care of personal grooming?: None Help from another person toileting, which includes using toliet, bedpan, or urinal?: None Help from another person bathing (including washing, rinsing, drying)?: None Help from another person to put on and taking off regular upper body clothing?: None Help from another person to put on and taking off regular lower body clothing?: None 6 Click Score: 24   End of Session Equipment Utilized During Treatment: Rolling walker Nurse Communication: Mobility status  Activity Tolerance: Patient tolerated treatment well Patient left: in chair;with call bell/phone within reach  OT Visit Diagnosis: Pain Pain - Right/Left: Left Pain - part of body: Knee                Time: 6237-6283 OT Time Calculation (min): 17 min Charges:  OT General Charges $OT Visit: 1 Visit OT Evaluation $OT Eval Low Complexity: 1 Low  Diane Stein OT OT pager: [phone]  Diane Stein 08/08/2020, 10:49 AM

## 2020-08-08 NOTE — Discharge Summary (Signed)
Physician Discharge Summary  Patient ID: Diane Stein MRN: [id number] DOB/AGE: 1985/10/02 35 y.o.  Admit date: 08/07/2020 Discharge date: 08/08/2020  Admission Diagnoses: left knee avascular necrosis  Discharge Diagnoses:  Active Problems:   Avascular necrosis of left femur (HCC)   S/P total knee arthroplasty, left   Discharged Condition: fair  Hospital Course: Patient had a left total knee arthroplasty by Dr. Eulah Pont on 08/07/20 and spent the night to optimize pain control and work on mobilization with PT. She has passed her PT evals and is ready to go home.   Consults: None  Significant Diagnostic Studies: none  Treatments: IV hydration, analgesia: acetaminophen, Dilaudid, Morphine and oxycodone, anticoagulation: ASA and surgery: left total knee replacement  Discharge Exam: Blood pressure 128/82, pulse 92, temperature 98.2 F (36.8 C), temperature source Oral, resp. rate 18, height 5\' 4"  (1.626 m), weight 92.1 kg, SpO2 94 %. General appearance: alert, cooperative and no distress Eyes: conjunctivae/corneas clear. PERRL, EOM's intact. Fundi benign. Resp: clear to auscultation bilaterally Cardio: regular rate and rhythm, S1, S2 normal, no murmur, click, rub or gallop GI: soft, non-tender; bowel sounds normal; no masses,  no organomegaly Extremities: no edema, redness or tenderness in the calves or thighs Pulses: 2+ and symmetric Neurologic: Alert and oriented X 3, normal strength and tone. Normal symmetric reflexes. Normal coordination and gait Incision/Wound: covered with ACE bandage and knee immoblizer MSK: LLE NVI, TTP around knee and ankle, dorsiflexion/plantarflexion intact, wiggles toes, able to do straight leg raise  Disposition: Discharge disposition: 01-Home or Self Care       Discharge Instructions    Call MD / Call 911   Complete by: As directed    If you experience chest pain or shortness of breath, CALL 911 and be transported to the hospital emergency  room.  If you develope a fever above 101 F, pus (white drainage) or increased drainage or redness at the wound, or calf pain, call your surgeon's office.   Diet - low sodium heart healthy   Complete by: As directed    Discharge instructions   Complete by: As directed    You may bear weight as tolerated. Keep your dressing on and dry until follow up. Take medicine to prevent blood clots as directed. Take pain medicine as needed with the goal of transitioning to over the counter medicines.  If needed, you may increase breakthrough pain medication (oxycodone) for the first few days post op - up to 2 tablets every 4 hours.  Stop this medication as soon as you are able.  INSTRUCTIONS AFTER JOINT REPLACEMENT   Remove items at home which could result in a fall. This includes throw rugs or furniture in walking pathways ICE to the affected joint every three hours while awake for 30 minutes at a time, for at least the first 3-5 days, and then as needed for pain and swelling.  Continue to use ice for pain and swelling. You may notice swelling that will progress down to the foot and ankle.  This is normal after surgery.  Elevate your leg when you are not up walking on it.   Continue to use the breathing machine you got in the hospital (incentive spirometer) which will help keep your temperature down.  It is common for your temperature to cycle up and down following surgery, especially at night when you are not up moving around and exerting yourself.  The breathing machine keeps your lungs expanded and your temperature down.   DIET:  As  you were doing prior to hospitalization, we recommend a well-balanced diet.  DRESSING / WOUND CARE / SHOWERING  You may shower 3 days after surgery, but keep the dressing dry during showering.  You may use an occlusive plastic wrap (Press'n Seal for example) with blue painter's tape at edges to cover the dressing, NO SOAKING/SUBMERGING IN THE BATHTUB.  If the bandage gets  wet, change with a clean dry gauze.  If the incision gets wet, pat the wound dry with a clean towel and call the office.  ACTIVITY  Increase activity slowly as tolerated, but follow the weight bearing instructions below.   No driving for 6 weeks or until further direction given by your physician.  You cannot drive while taking narcotics.  No lifting or carrying greater than 10 lbs. until further directed by your surgeon. Avoid periods of inactivity such as sitting longer than an hour when not asleep. This helps prevent blood clots.  You may return to work once you are authorized by your doctor.     WEIGHT BEARING   Weight bearing as tolerated with assist device (walker, cane, etc) as directed, use it as long as suggested by your surgeon or therapist, typically at least 4-6 weeks.   EXERCISES  Results after joint replacement surgery are often greatly improved when you follow the exercise, range of motion and muscle strengthening exercises prescribed by your doctor. Safety measures are also important to protect the joint from further injury. Any time any of these exercises cause you to have increased pain or swelling, decrease what you are doing until you are comfortable again and then slowly increase them. If you have problems or questions, call your caregiver or physical therapist for advice.   Rehabilitation is important following a joint replacement. After just a few days of immobilization, the muscles of the leg can become weakened and shrink (atrophy).  These exercises are designed to build up the tone and strength of the thigh and leg muscles and to improve motion. Often times heat used for twenty to thirty minutes before working out will loosen up your tissues and help with improving the range of motion but do not use heat for the first two weeks following surgery (sometimes heat can increase post-operative swelling).   These exercises can be done on a training (exercise) mat, on the  floor, on a table or on a bed. Use whatever works the best and is most comfortable for you.    Use music or television while you are exercising so that the exercises are a pleasant break in your day. This will make your life better with the exercises acting as a break in your routine that you can look forward to.   Perform all exercises about fifteen times, three times per day or as directed.  You should exercise both the operative leg and the other leg as well.  Exercises include:   Quad Sets - Tighten up the muscle on the front of the thigh (Quad) and hold for 5-10 seconds.   Straight Leg Raises - With your knee straight (if you were given a brace, keep it on), lift the leg to 60 degrees, hold for 3 seconds, and slowly lower the leg.  Perform this exercise against resistance later as your leg gets stronger.  Leg Slides: Lying on your back, slowly slide your foot toward your buttocks, bending your knee up off the floor (only go as far as is comfortable). Then slowly slide your foot back down until  your leg is flat on the floor again.  Angel Wings: Lying on your back spread your legs to the side as far apart as you can without causing discomfort.  Hamstring Strength:  Lying on your back, push your heel against the floor with your leg straight by tightening up the muscles of your buttocks.  Repeat, but this time bend your knee to a comfortable angle, and push your heel against the floor.  You may put a pillow under the heel to make it more comfortable if necessary.   A rehabilitation program following joint replacement surgery can speed recovery and prevent re-injury in the future due to weakened muscles. Contact your doctor or a physical therapist for more information on knee rehabilitation.    CONSTIPATION  Constipation is defined medically as fewer than three stools per week and severe constipation as less than one stool per week.  Even if you have a regular bowel pattern at home, your normal  regimen is likely to be disrupted due to multiple reasons following surgery.  Combination of anesthesia, postoperative narcotics, change in appetite and fluid intake all can affect your bowels.   YOU MUST use at least one of the following options; they are listed in order of increasing strength to get the job done.  They are all available over the counter, and you may need to use some, POSSIBLY even all of these options:    Drink plenty of fluids (prune juice may be helpful) and high fiber foods Colace 100 mg by mouth twice a day  Senokot for constipation as directed and as needed Dulcolax (bisacodyl), take with full glass of water  Miralax (polyethylene glycol) once or twice a day as needed.  If you have tried all these things and are unable to have a bowel movement in the first 3-4 days after surgery call either your surgeon or your primary doctor.    If you experience loose stools or diarrhea, hold the medications until you stool forms back up.  If your symptoms do not get better within 1 week or if they get worse, check with your doctor.  If you experience "the worst abdominal pain ever" or develop nausea or vomiting, please contact the office immediately for further recommendations for treatment.   ITCHING:  If you experience itching with your medications, try taking only a single pain pill, or even half a pain pill at a time.  You can also use Benadryl over the counter for itching or also to help with sleep.   TED HOSE STOCKINGS:  Use stockings on both legs until for at least 2 weeks or as directed by physician office. They may be removed at night for sleeping.  MEDICATIONS:  See your medication summary on the "After Visit Summary" that nursing will review with you.  You may have some home medications which will be placed on hold until you complete the course of blood thinner medication.  It is important for you to complete the blood thinner medication as prescribed.  - Tylenol is for mild  to moderate pain.  - Oxycodone is for severe pain.  - Celebrex is for pain and inflammation.  - Robaxin is for muscle spasms.  - Zofran is for nausea and vomiting.  - Omeprazole is for gastric protection while taking pain medicines.  - Aspirin is to prevent blood clots after surgery.  PRECAUTIONS:  If you experience chest pain or shortness of breath - call 911 immediately for transfer to the hospital emergency department.  If you develop a fever greater that 101 F, purulent drainage from wound, increased redness or drainage from wound, foul odor from the wound/dressing, or calf pain - CONTACT YOUR SURGEON.                                                   FOLLOW-UP APPOINTMENTS:  If you do not already have a post-op appointment, please call the office for an appointment to be seen by your surgeon in 7-10 days  OTHER INSTRUCTIONS:  Dental Antibiotics:  Contact your surgeon for an antibiotic prescription prior to your dental procedure for up to 2 years post-op.   MAKE SURE YOU:  Understand these instructions.  Get help right away if you are not doing well or get worse.    Thank you for letting us be a part of your medical care team.  It is a privilege we respect greatly.  We hope these instructions will help you stay on track for a fast and full recovery!       Follow-up Information    Schedule an appointment as soon as possible for a visit with Sheral Apley, MD.   Specialty: Orthopedic Surgery Why: to follow up in the office in 7-10 days Contact information: [address] [place] [address] [phone]               Signed: Marzetta Board 08/08/2020, 11:41 AM

## 2020-08-08 NOTE — Progress Notes (Signed)
    Subjective: Patient reports pain as moderate. Medicines help but don't last long she says. Nauseous yesterday but better today. Had trouble sleeping. Tolerating diet. Urinating. No CP, SOB.  Has mobilized OOB with PT which went well.   Objective:   VITALS:   Vitals:   08/07/20 2233 08/08/20 0153 08/08/20 0612 08/08/20 0747  BP: 126/67 102/60 (!) 111/55   Pulse: 84 82 82   Resp: 16 15 16    Temp: 98 F (36.7 C) 97.9 F (36.6 C) 97.8 F (36.6 C)   TempSrc: Oral Oral Oral   SpO2: 95% 96% 96% 95%  Weight:      Height:       CBC Latest Ref Rng & Units 07/30/2020 01/09/2020 12/20/2019  WBC 4.0 - 10.5 K/uL 16.9(H) 12.4(H) 12.9(H)  Hemoglobin 12.0 - 15.0 g/dL 12/22/2019 00.8 67.6  Hematocrit 36.0 - 46.0 % 44.3 46.6(H) 43.8  Platelets 150 - 400 K/uL 310 305 303   BMP Latest Ref Rng & Units 07/30/2020 01/09/2020 12/20/2019  Glucose 70 - 99 mg/dL 94 99 96  BUN 6 - 20 mg/dL 14 8 7   Creatinine 0.44 - 1.00 mg/dL 12/22/2019 0.93  Sodium 135 - 145 mmol/L 141 139 139  Potassium 3.5 - 5.1 mmol/L 4.0 4.6 3.7  Chloride 98 - 111 mmol/L 108 105 105  CO2 22 - 32 mmol/L 24 22 25   Calcium 8.9 - 10.3 mg/dL 9.2 9.5 9.2   Intake/Output      03/15 0701 03/16 0700 03/16 0701 03/17 0700   P.O. 1560    I.V. (mL/kg) 1100 (11.9) 0 (0)   IV Piggyback 350    Total Intake(mL/kg) 3010 (32.7) 0 (0)   Urine (mL/kg/hr) 1950 (0.9)    Blood 50    Total Output 2000    Net +1010 0           Physical Exam: General: NAD.  Sitting up in bed comfortably. Resp: No increased wob Cardio: regular rate and rhythm ABD soft Neurologically intact MSK Neurovascularly intact Sensation intact distally Intact pulses distally Dorsiflexion/Plantar flexion intact Incision: dressing C/D/I Knee immobilizer    Assessment: 1 Day Post-Op  S/P Procedure(s) (LRB): TOTAL KNEE ARTHROPLASTY (Left) by Dr. 4/16. 4/16 on 08/07/20  Active Problems:   Avascular necrosis of left femur (HCC)   S/P total knee arthroplasty,  left   Plan: Try to better control pain today Advance diet Up with therapy Incentive Spirometry Elevate and Apply ice  Weightbearing: WBAT LLE Insicional and dressing care: Dressings left intact until follow-up and Reinforce dressings as needed Orthopedic device(s): knee immobilizer Showering: on POD 3, Keep dressing dry VTE prophylaxis: Aspirin 81mg  BID for 30 days post op, SCDs, ambulation Pain control: Continue current regimen Follow - up plan: 1 week after d/c Contact information:  Jewel Baize MD, Eulah Pont PA-C  Dispo: Home hopefully later today but possibly not until tomorrow. Pending PT evals today and better pain control   08/09/20 Office [phone] 08/08/2020, 8:47 AM

## 2020-08-08 NOTE — Progress Notes (Addendum)
Physical Therapy Treatment Patient Details Name: Diane Stein MRN: [id number] DOB: 1986/03/18 Today's Date: 08/08/2020    History of Present Illness Patient is 35 y.o. female s/p Lt TKA on 08/07/20 with PMH significant for tachycardia, depression, anxiety, OA, AVN of Lt knee.    PT Comments    Pt able to demonstrate ankle pumps appropriately while supine in bed. Pt using bil hands to assist LLE over to EOB with increased time. Pt rises to stand with UE assisting and LLE extended. PT educated pt on step to gait pattern, maitaining RW on ground with progression, and heel initial contact to improve knee ROM. PT demonstrates stair training with RW and no HR, correct sequencing, and RW Management. Pt able to perform stairs with min guard and therapist stabilizing RW for safety. Pt with 7/10 pain throughout session, minimal increase to 8/10 after ambulation and stair training. Therapist completed all educated on mobility and provided written illustrated sheet for stair training at home. Pt with good family support at home, all questions answered, all education completed.   Follow Up Recommendations  Outpatient PT;Follow surgeon's recommendation for DC plan and follow-up therapies     Equipment Recommendations  None recommended by PT (pt owns RW)    Recommendations for Other Services       Precautions / Restrictions Precautions Precautions: Fall Restrictions Weight Bearing Restrictions: No Other Position/Activity Restrictions: WBAT    Mobility  Bed Mobility Overal bed mobility: Modified Independent  General bed mobility comments: increased time, UE lightly assisting LLE over to EOB    Transfers Overall transfer level: Needs assistance Equipment used: Rolling walker (2 wheeled) Transfers: Sit to/from Stand Sit to Stand: Supervision  General transfer comment: BUE assisting to power up with LLE extended, good steadiness upon rising  Ambulation/Gait Ambulation/Gait assistance:  Supervision Gait Distance (Feet): 90 Feet Assistive device: Rolling walker (2 wheeled) Gait Pattern/deviations: Step-to pattern;Decreased stride length;Decreased weight shift to left Gait velocity: decreased   General Gait Details: step to pattern, cues to maintain RW on ground with progression, increased time with turns, no knee buckling or LOB   Stairs Stairs: Yes Stairs assistance: Min guard Stair Management: Backwards;Forwards;With walker Number of Stairs: 3 General stair comments: pt ascends 3 steps with RW backwards, descends 3 steps with RW forward, cued for sequencing with good carryover and therapist stablizing RW due to no handrails   Wheelchair Mobility    Modified Rankin (Stroke Patients Only)       Balance Overall balance assessment: Needs assistance Sitting-balance support: Feet supported Sitting balance-Leahy Scale: Good  Standing balance support: During functional activity Standing balance-Leahy Scale: Fair Standing balance comment: static without UE support, dynamic with RW     Cognition Arousal/Alertness: Awake/alert Behavior During Therapy: WFL for tasks assessed/performed Overall Cognitive Status: Within Functional Limits for tasks assessed     Exercises      General Comments        Pertinent Vitals/Pain Pain Assessment: 0-10 Pain Score: 7  Pain Location: Lt knee Pain Descriptors / Indicators: Aching Pain Intervention(s): Limited activity within patient's tolerance;Monitored during session;Premedicated before session;Repositioned;Ice applied    Home Living                      Prior Function            PT Goals (current goals can now be found in the care plan section) Acute Rehab PT Goals Patient Stated Goal: play on trampoline with daughter PT Goal Formulation: With patient  Time For Goal Achievement: 08/14/20 Potential to Achieve Goals: Good Progress towards PT goals: Progressing toward goals    Frequency     7X/week      PT Plan Current plan remains appropriate    Co-evaluation              AM-PAC PT "6 Clicks" Mobility   Outcome Measure  Help needed turning from your back to your side while in a flat bed without using bedrails?: None Help needed moving from lying on your back to sitting on the side of a flat bed without using bedrails?: None Help needed moving to and from a bed to a chair (including a wheelchair)?: None Help needed standing up from a chair using your arms (e.g., wheelchair or bedside chair)?: None Help needed to walk in hospital room?: None Help needed climbing 3-5 steps with a railing? : A Little 6 Click Score: 23    End of Session Equipment Utilized During Treatment: Gait belt Activity Tolerance: Patient tolerated treatment well Patient left: in chair;with call bell/phone within reach Nurse Communication: Mobility status PT Visit Diagnosis: Unsteadiness on feet (R26.81);Muscle weakness (generalized) (M62.81);Pain Pain - Right/Left: Left Pain - part of body: Knee     Time: 0962-8366 PT Time Calculation (min) (ACUTE ONLY): 25 min  Charges:  $Gait Training: 23-37 mins                      Tori Bunnie Lederman PT, DPT 08/08/20, 9:49 AM

## 2022-09-22 ENCOUNTER — Other Ambulatory Visit: Payer: Self-pay

## 2022-09-22 ENCOUNTER — Emergency Department (HOSPITAL_BASED_OUTPATIENT_CLINIC_OR_DEPARTMENT_OTHER): Payer: 59

## 2022-09-22 ENCOUNTER — Encounter (HOSPITAL_BASED_OUTPATIENT_CLINIC_OR_DEPARTMENT_OTHER): Payer: Self-pay | Admitting: Emergency Medicine

## 2022-09-22 ENCOUNTER — Emergency Department (HOSPITAL_BASED_OUTPATIENT_CLINIC_OR_DEPARTMENT_OTHER)
Admission: EM | Admit: 2022-09-22 | Discharge: 2022-09-22 | Disposition: A | Discharge status: Home / Self Care | Payer: 59 | Attending: Emergency Medicine | Admitting: Emergency Medicine

## 2022-09-22 DIAGNOSIS — F17210 Nicotine dependence, cigarettes, uncomplicated: Secondary | ICD-10-CM | POA: Diagnosis not present

## 2022-09-22 DIAGNOSIS — Z20822 Contact with and (suspected) exposure to covid-19: Secondary | ICD-10-CM | POA: Insufficient documentation

## 2022-09-22 DIAGNOSIS — J45909 Unspecified asthma, uncomplicated: Secondary | ICD-10-CM | POA: Insufficient documentation

## 2022-09-22 DIAGNOSIS — R000 Tachycardia, unspecified: Secondary | ICD-10-CM | POA: Insufficient documentation

## 2022-09-22 DIAGNOSIS — Z96652 Presence of left artificial knee joint: Secondary | ICD-10-CM | POA: Diagnosis not present

## 2022-09-22 DIAGNOSIS — J989 Respiratory disorder, unspecified: Secondary | ICD-10-CM | POA: Diagnosis not present

## 2022-09-22 DIAGNOSIS — Z7982 Long term (current) use of aspirin: Secondary | ICD-10-CM | POA: Insufficient documentation

## 2022-09-22 DIAGNOSIS — R509 Fever, unspecified: Secondary | ICD-10-CM | POA: Diagnosis present

## 2022-09-22 LAB — RESP PANEL BY RT-PCR (RSV, FLU A&B, COVID)  RVPGX2
Influenza A by PCR: NEGATIVE
Influenza B by PCR: NEGATIVE
Resp Syncytial Virus by PCR: NEGATIVE
SARS Coronavirus 2 by RT PCR: NEGATIVE

## 2022-09-22 MED ORDER — AZITHROMYCIN 250 MG PO TABS
ORAL_TABLET | ORAL | 0 refills | Status: DC
Start: 2022-09-22 — End: 2022-12-29

## 2022-09-22 MED ORDER — HYDROCOD POLI-CHLORPHE POLI ER 10-8 MG/5ML PO SUER: 5.0000 mL | Freq: Two times a day (BID) | ORAL | 0 refills | Status: DC | PRN

## 2022-09-22 MED ORDER — HYDROCOD POLI-CHLORPHE POLI ER 10-8 MG/5ML PO SUER
5.0000 mL | Freq: Once | ORAL | Status: AC
  Administered 2022-09-22: 5 mL via ORAL
  Filled 2022-09-22: qty 5

## 2022-09-22 NOTE — ED Triage Notes (Signed)
Fever, chest tightness, congestion, cough, body aches since Saturday (4/27). Pt states she took OTC "cold medicine" without relief. Tmax 102.

## 2022-09-22 NOTE — ED Provider Notes (Signed)
[facility] Provider Note: Lowella Dell, MD, FACEP  CSN: [id number] MRN: [id number] ARRIVAL: 09/22/22 at 0426 ROOM: MH05/[facility]05   CHIEF COMPLAINT  Fever   HISTORY OF PRESENT ILLNESS  09/22/22 4:45 AM Diane Stein is a 37 y.o. female with 2 days of flulike symptoms.  Specifically she has had fever to 102, chest tightness, nasal congestion, productive cough, and bodyaches.  She has taken over-the-counter cold medication without relief.  She rates her body aches as an 8 out of 10.  She has not had a sore throat with this.   Past Medical History:  Diagnosis Date   Anxiety    Arthritis    Asthma    well controlled   Avascular necrosis (HCC)    left knee   Bronchitis    history   Depression    Dysrhythmia    rapid heart rate, released by cardiology 2016   Pneumonia 2014, 2019   History   SVD (spontaneous vaginal delivery)    x 1   Tachycardia 2016    Past Surgical History:  Procedure Laterality Date   ABDOMINAL HYSTERECTOMY     BONE MARROW BIOPSY Right 01/10/2020   Procedure: BONE MARROW ASPIRATION;  Surgeon: Sheral Apley, MD;  Location: [facility];  Service: Orthopedics;  Laterality: Right;   HYSTERECTOMY ABDOMINAL WITH SALPINGO-OOPHORECTOMY Bilateral 03/10/2018   Procedure: TOTAL ABDOMINAL HYSTERECTOMY WITH BILATERAL SALPINGO-OOPHORECTOMY;  Surgeon: Lavina Hamman, MD;  Location: [facility];  Service: Gynecology;  Laterality: Bilateral;   KNEE ARTHROSCOPY Right 01/10/2020   Procedure: DIAGNOSTIC ARTHROSCOPY KNEE;  Surgeon: Sheral Apley, MD;  Location: [facility];  Service: Orthopedics;  Laterality: Right;   KNEE ARTHROSCOPY WITH SUBCHONDROPLASTY  01/10/2020   Procedure: KNEE ARTHROSCOPY WITH SUBCHONDROPLASTY;  Surgeon: Sheral Apley, MD;  Location: [facility];  Service: Orthopedics;;   KNEE ARTHROSCOPY WITH SUBCHONDROPLASTY Left 04/10/2020   Procedure: KNEE ARTHROSCOPY WITH SUBCHONDROPLASTY, HARVEST  OF BONE GRAFT FROM ILIAC CREST;  Surgeon: Sheral Apley, MD;  Location: [facility];  Service: Orthopedics;  Laterality: Left;   LAPAROSCOPIC UNILATERAL SALPINGECTOMY Left 08/28/2017   Procedure: LAPAROSCOPIC UNILATERAL SALPINGECTOMY WITH REMOVAL OF PARA TUBAL CYST;  Surgeon: Lavina Hamman, MD;  Location: [facility];  Service: Gynecology;  Laterality: Left;   LAPAROSCOPY Left 08/28/2017   Procedure: LAPAROSCOPY OPERATIVE, FULGERATION OF ENDOMETRIOSIS;  Surgeon: Lavina Hamman, MD;  Location: [facility];  Service: Gynecology;  Laterality: Left;   TOOTH EXTRACTION Right 2008   TOTAL KNEE ARTHROPLASTY Left 08/07/2020   Procedure: TOTAL KNEE ARTHROPLASTY;  Surgeon: Sheral Apley, MD;  Location: [facility];  Service: Orthopedics;  Laterality: Left;    Family History  Problem Relation Age of Onset   Hypertension Mother    Heart disease Father    Heart disease Maternal Grandmother    Hypertension Maternal Grandmother    Heart disease Maternal Grandfather    Hypertension Maternal Grandfather    Heart disease Paternal Grandmother    Hypertension Paternal Grandmother    Heart disease Paternal Grandfather    Hypertension Paternal Grandfather     Social History   Tobacco Use   Smoking status: Every Day    Packs/day: 0.25    Years: 10.00    Additional pack years: 0.00    Total pack years: 2.50    Types: Cigarettes   Smokeless tobacco: Never  Vaping Use   Vaping Use: Some days   Substances: Nicotine  Substance Use Topics   Alcohol  use: Never   Drug use: No    Prior to Admission medications   Medication Sig Start Date End Date Taking? Authorizing Provider  azithromycin (ZITHROMAX) 250 MG tablet Take 2 tablets today, then 1 tablet daily for the next 4 days. 09/22/22  Yes Raden Byington, MD  chlorpheniramine-HYDROcodone (TUSSIONEX) 10-8 MG/5ML Take 5 mLs by mouth every 12 (twelve) hours as needed for cough. 09/22/22  Yes Jackelyne Sayer, MD  alprazolam Prudy Feeler) 2 MG tablet Take 2 mg by  mouth 3 (three) times daily as needed for anxiety.    [provider]  aspirin EC 81 MG tablet Take 1 tablet (81 mg total) by mouth 2 (two) times daily. For DVT prophylaxis for 30 days after surgery. 08/08/20   Jenne Pane, PA-C  doxepin (SINEQUAN) 50 MG capsule Take 50-100 mg by mouth at bedtime as needed (sleep).     [provider]  EPINEPHrine 0.3 mg/0.3 mL IJ SOAJ injection Inject 0.3 mLs (0.3 mg total) into the muscle once as needed for up to 1 dose (anaphalaxis). 01/09/18   Loren Racer, MD  FLUoxetine (PROZAC) 20 MG capsule Take 60 mg by mouth daily.    [provider]  hydrOXYzine (ATARAX/VISTARIL) 10 MG tablet Take 10 mg by mouth See admin instructions. Take 10 mg daily, may take a second 10 mg dose as needed for anxiety    [provider]  levocetirizine (XYZAL) 5 MG tablet Take 5 mg by mouth every evening.    [provider]  melatonin 3 MG TABS tablet Take 3 mg by mouth at bedtime as needed (sleep).    [provider]  methocarbamol (ROBAXIN) 500 MG tablet Take 1 tablet (500 mg total) by mouth every 8 (eight) hours as needed for muscle spasms. 08/08/20   Jenne Pane, PA-C  omeprazole (PRILOSEC OTC) 20 MG tablet Take 1 tablet (20 mg total) by mouth daily. For gastric protection while taking pain medicines. 08/08/20 09/07/20  Jenne Pane, PA-C  ondansetron (ZOFRAN) 4 MG tablet Take 1 tablet (4 mg total) by mouth daily as needed for nausea or vomiting. 08/08/20   Jenne Pane, PA-C  rosuvastatin (CRESTOR) 10 MG tablet Take 10 mg by mouth at bedtime.    [provider]    Allergies Avocado, Other, and Benadryl [diphenhydramine]   REVIEW OF SYSTEMS  Negative except as noted here or in the History of Present Illness.   PHYSICAL EXAMINATION  Initial Vital Signs Blood pressure 113/70, pulse (!) 112, temperature 98.2 F (36.8 C), temperature source Oral, resp. rate 20, height 5\' 4"  (1.626 m), weight 75 kg, SpO2  97 %.  Examination General: Well-developed, well-nourished female in no acute distress; appearance consistent with age of record HENT: normocephalic; atraumatic; nasal congestion Eyes: Appearance Neck: supple Heart: regular rate and rhythm; tachycardia Lungs: clear to auscultation bilaterally Abdomen: soft; nondistended; nontender; bowel sounds present Extremities: No deformity; full range of motion; pulses normal Neurologic: Awake, alert and oriented; motor function intact in all extremities and symmetric; no facial droop Skin: Warm and dry Psychiatric: Normal mood and affect   RESULTS  Summary of this visit's results, reviewed and interpreted by myself:   EKG Interpretation  Date/Time:    Ventricular Rate:    PR Interval:    QRS Duration:   QT Interval:    QTC Calculation:   R Axis:     Text Interpretation:         Laboratory Studies: Results for orders placed or performed  during the hospital encounter of 09/22/22 (from the past 24 hour(s))  Resp panel by RT-PCR (RSV, Flu A&B, Covid) Anterior Nasal Swab     Status: None   Collection Time: 09/22/22  4:52 AM   Specimen: Anterior Nasal Swab  Result Value Ref Range   SARS Coronavirus 2 by RT PCR NEGATIVE NEGATIVE   Influenza A by PCR NEGATIVE NEGATIVE   Influenza B by PCR NEGATIVE NEGATIVE   Resp Syncytial Virus by PCR NEGATIVE NEGATIVE   Imaging Studies: DG Chest 2 View  Result Date: 09/22/2022 CLINICAL DATA:  Cough and fever with body aches EXAM: CHEST - 2 VIEW COMPARISON:  12/21/2019 FINDINGS: Prominent interstitial markings with streaky density over the heart on the lateral view. No lobar consolidation, edema, or effusion. Normal heart size. IMPRESSION: Mild accentuation of lung markings, likely bronchitic with mild atelectasis or bronchopneumonia on the lateral view. Electronically Signed   By: Tiburcio Pea M.D.   On: 09/22/2022 05:04    ED COURSE and MDM  Nursing notes, initial and subsequent vitals signs,  including pulse oximetry, reviewed and interpreted by myself.  Vitals:   09/22/22 0437 09/22/22 0442  BP:  113/70  Pulse:  (!) 112  Resp:  20  Temp:  98.2 F (36.8 C)  TempSrc:  Oral  SpO2:  97%  Weight: 75 kg   Height: 5\' 4"  (1.626 m)    Medications  chlorpheniramine-HYDROcodone (TUSSIONEX) 10-8 MG/5ML suspension 5 mL (has no administration in time range)   The patient's symptoms are most likely due to a viral illness.  Should her lungs sound clear.  She is not short of breath and her oxygen saturation is 97%.  I do not believe an albuterol inhaler is indicated at this time.  Since she does have some changes suggesting bronchopneumonia we will place her on an antibiotic in the meantime.  We will also treat her cough.   PROCEDURES  Procedures   ED DIAGNOSES     ICD-10-CM   1. Respiratory illness with fever  J98.9    R50.9          Criss Bartles, Jonny Ruiz, MD 09/22/22 [phone]

## 2022-10-13 ENCOUNTER — Emergency Department (HOSPITAL_BASED_OUTPATIENT_CLINIC_OR_DEPARTMENT_OTHER): Payer: 59

## 2022-10-13 ENCOUNTER — Emergency Department (HOSPITAL_BASED_OUTPATIENT_CLINIC_OR_DEPARTMENT_OTHER)
Admission: EM | Admit: 2022-10-13 | Discharge: 2022-10-13 | Disposition: A | Discharge status: Home / Self Care | Payer: 59 | Attending: Emergency Medicine | Admitting: Emergency Medicine

## 2022-10-13 ENCOUNTER — Encounter (HOSPITAL_BASED_OUTPATIENT_CLINIC_OR_DEPARTMENT_OTHER): Payer: Self-pay | Admitting: Urology

## 2022-10-13 DIAGNOSIS — M25561 Pain in right knee: Secondary | ICD-10-CM

## 2022-10-13 MED ORDER — IBUPROFEN 800 MG PO TABS
800.0000 mg | ORAL_TABLET | Freq: Once | ORAL | Status: AC
Start: 2022-10-13 — End: 2022-10-13
  Administered 2022-10-13: 800 mg via ORAL
  Filled 2022-10-13: qty 1

## 2022-10-13 MED ORDER — OXYCODONE-ACETAMINOPHEN 5-325 MG PO TABS
1.0000 | ORAL_TABLET | Freq: Once | ORAL | Status: DC
  Filled 2022-10-13: qty 1

## 2022-10-13 MED ORDER — OXYCODONE-ACETAMINOPHEN 5-325 MG PO TABS: 1.0000 | ORAL_TABLET | Freq: Four times a day (QID) | ORAL | 0 refills | Status: DC | PRN

## 2022-10-13 NOTE — ED Notes (Signed)
Asked pt about ride home because I cannot give her narc pain med without a ride and pt has 36 yo child with her, she said her mom was out front but  when she called her mom to come in she did not answer and she told she had gone home , pt states she drove herself here , doctor made aware

## 2022-10-13 NOTE — ED Triage Notes (Signed)
Pt states severe right knee pain that started 2 days ago  Pt ambulatory to triage H/o osteonecrosis with [facility]

## 2022-10-13 NOTE — Discharge Instructions (Signed)
Please call Dr. Eulah Pont for close follow-up.  Knee immobilizer as needed for comfort.  Oxycodone may make you dizzy nauseous constipated so please use caution.

## 2022-10-13 NOTE — ED Provider Notes (Signed)
[facility] [facility] DEPARTMENT AT [facility] Provider Note   CSN: [id number] Arrival date & time: 10/13/22  1108     History  Chief Complaint  Patient presents with   Knee Pain    Diane Stein is a 37 y.o. female.  She is complaining of ongoing right knee pain it has been going on for months.  Worse over the last 4 days.  No trauma.  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  Follows with Dr. Eulah Pont.  No fevers or chills.  Pain is good getting progressively worse and she works on her feet at a dialysis center.  No numbness or weakness.  The history is provided by the patient.  Knee Pain Location:  Knee Time since incident:  4 days Injury: no   Knee location:  R knee Pain details:    Quality:  Aching   Severity:  Severe   Onset quality:  Gradual   Progression:  Worsening Dislocation: no   Relieved by:  Nothing Worsened by:  Bearing weight Ineffective treatments:  None tried Associated symptoms: no fever, no numbness and no tingling   Risk factors: known bone disorder        Home Medications Prior to Admission medications   Medication Sig Start Date End Date Taking? Authorizing Provider  alprazolam Prudy Feeler) 2 MG tablet Take 2 mg by mouth 3 (three) times daily as needed for anxiety.    [provider]  aspirin EC 81 MG tablet Take 1 tablet (81 mg total) by mouth 2 (two) times daily. For DVT prophylaxis for 30 days after surgery. 08/08/20   Jenne Pane, PA-C  azithromycin (ZITHROMAX) 250 MG tablet Take 2 tablets today, then 1 tablet daily for the next 4 days. 09/22/22   Molpus, John, MD  chlorpheniramine-HYDROcodone (TUSSIONEX) 10-8 MG/5ML Take 5 mLs by mouth every 12 (twelve) hours as needed for cough. 09/22/22   Molpus, John, MD  doxepin (SINEQUAN) 50 MG capsule Take 50-100 mg by mouth at bedtime as needed (sleep).     [provider]  EPINEPHrine 0.3 mg/0.3 mL IJ SOAJ injection  Inject 0.3 mLs (0.3 mg total) into the muscle once as needed for up to 1 dose (anaphalaxis). 01/09/18   Loren Racer, MD  FLUoxetine (PROZAC) 20 MG capsule Take 60 mg by mouth daily.    [provider]  hydrOXYzine (ATARAX/VISTARIL) 10 MG tablet Take 10 mg by mouth See admin instructions. Take 10 mg daily, may take a second 10 mg dose as needed for anxiety    [provider]  levocetirizine (XYZAL) 5 MG tablet Take 5 mg by mouth every evening.    [provider]  melatonin 3 MG TABS tablet Take 3 mg by mouth at bedtime as needed (sleep).    [provider]  methocarbamol (ROBAXIN) 500 MG tablet Take 1 tablet (500 mg total) by mouth every 8 (eight) hours as needed for muscle spasms. 08/08/20   Jenne Pane, PA-C  omeprazole (PRILOSEC OTC) 20 MG tablet Take 1 tablet (20 mg total) by mouth daily. For gastric protection while taking pain medicines. 08/08/20 09/07/20  Jenne Pane, PA-C  ondansetron (ZOFRAN) 4 MG tablet Take 1 tablet (4 mg total) by mouth daily as needed for nausea or vomiting. 08/08/20   Jenne Pane, PA-C  rosuvastatin (CRESTOR) 10 MG tablet Take 10 mg by mouth at bedtime.    [provider]  Allergies    Avocado, Other, and Benadryl [diphenhydramine]    Review of Systems   Review of Systems  Constitutional:  Negative for fever.    Physical Exam Updated Vital Signs BP 115/74 (BP Location: Left Arm)   Pulse 98   Temp 98.1 F (36.7 C) (Tympanic)   Resp 18   Ht 5\' 4"  (1.626 m)   Wt 74.8 kg   LMP  (LMP Unknown)   SpO2 100%   BMI 28.32 kg/m  Physical Exam Constitutional:      Appearance: Normal appearance. She is well-developed.  HENT:     Head: Normocephalic and atraumatic.  Eyes:     Conjunctiva/sclera: Conjunctivae normal.  Musculoskeletal:        General: Tenderness present. No deformity.     Cervical back: Neck supple.     Comments: She has diffuse right knee tenderness.  There is no significant effusion  or erythema.  Calf is supple without any cords or swelling.  Distal pulses motor sensation intact.  Skin:    General: Skin is warm and dry.  Neurological:     General: No focal deficit present.     Mental Status: She is alert.     GCS: GCS eye subscore is 4. GCS verbal subscore is 5. GCS motor subscore is 6.     Sensory: No sensory deficit.     Motor: No weakness.     ED Results / Procedures / Treatments   Labs (all labs ordered are listed, but only abnormal results are displayed) Labs Reviewed - No data to display  EKG None  Radiology DG Knee Complete 4 Views Right  Result Date: 10/13/2022 CLINICAL DATA:  Acute right knee pain. EXAM: RIGHT KNEE - COMPLETE 4+ VIEW COMPARISON:  December 16, 2019. FINDINGS: No evidence of fracture, dislocation, or joint effusion. There is noted sclerosis involving the lateral femoral condyle which may represent avascular necrosis. Soft tissues are unremarkable. IMPRESSION: Ill-defined sclerosis involving lateral femoral condyle concerning for possible avascular necrosis. MRI may be performed for further evaluation. No acute fracture or dislocation is noted. Electronically Signed   By: Lupita Raider M.D.   On: 10/13/2022 11:49    Procedures Procedures    Medications Ordered in ED Medications  oxyCODONE-acetaminophen (PERCOCET/ROXICET) 5-325 MG per tablet 1 tablet (has no administration in time range)    ED Course/ Medical Decision Making/ A&P Clinical Course as of 10/13/22 1659  Mon Oct 13, 2022  1207 I reviewed results of x-ray with patient.  She was aware of ongoing knee problems on that side.  She is asking for some pain medication and a knee brace and will follow-up with Dr. Eulah Pont orthopedics. [MB]    Clinical Course User Index [MB] Terrilee Files, MD                             Medical Decision Making Amount and/or Complexity of Data Reviewed Radiology: ordered.  Risk Prescription drug management.   This patient complains of  right knee pain; this involves an extensive number of treatment Options and is a complaint that carries with it a high risk of complications and morbidity. The differential includes arthritis, septic joint, meniscal injury, ligamentous injury, AVN I ordered medication ibuprofen and reviewed PMP when indicated. I ordered imaging studies which included right knee x-ray and I independently    visualized and interpreted imaging which showed sclerotic area rule out AVN Previous records obtained and  reviewed in epic including recent orthopedic notes Social determinants considered, ongoing tobacco use Critical Interventions: None  After the interventions stated above, I reevaluated the patient and found patient be neurovascularly intact Admission and further testing considered, no indications for admission.  Will provide short prescription for narcotics and recommend close follow-up with orthopedics.  Patient said she had a ride waiting for her but when questioned by nurse patient states she was driving.  Will not give narcotic pain medicine here to patient but prescription completed.         Final Clinical Impression(s) / ED Diagnoses Final diagnoses:  Acute pain of right knee    Rx / DC Orders ED Discharge Orders     None         Terrilee Files, MD 10/13/22 1701

## 2022-10-19 ENCOUNTER — Other Ambulatory Visit: Payer: Self-pay

## 2022-10-19 ENCOUNTER — Emergency Department (HOSPITAL_BASED_OUTPATIENT_CLINIC_OR_DEPARTMENT_OTHER): Payer: 59

## 2022-10-19 ENCOUNTER — Emergency Department (HOSPITAL_BASED_OUTPATIENT_CLINIC_OR_DEPARTMENT_OTHER)
Admission: EM | Admit: 2022-10-19 | Discharge: 2022-10-19 | Disposition: A | Discharge status: Home / Self Care | Payer: 59 | Attending: Emergency Medicine | Admitting: Emergency Medicine

## 2022-10-19 ENCOUNTER — Encounter (HOSPITAL_BASED_OUTPATIENT_CLINIC_OR_DEPARTMENT_OTHER): Payer: Self-pay | Admitting: Emergency Medicine

## 2022-10-19 DIAGNOSIS — W19XXXA Unspecified fall, initial encounter: Secondary | ICD-10-CM | POA: Insufficient documentation

## 2022-10-19 DIAGNOSIS — M25561 Pain in right knee: Secondary | ICD-10-CM | POA: Diagnosis not present

## 2022-10-19 DIAGNOSIS — M25531 Pain in right wrist: Secondary | ICD-10-CM | POA: Diagnosis present

## 2022-10-19 DIAGNOSIS — Z7982 Long term (current) use of aspirin: Secondary | ICD-10-CM | POA: Diagnosis not present

## 2022-10-19 DIAGNOSIS — S63501A Unspecified sprain of right wrist, initial encounter: Secondary | ICD-10-CM

## 2022-10-19 MED ORDER — OXYCODONE HCL 5 MG PO TABS
5.0000 mg | ORAL_TABLET | Freq: Once | ORAL | Status: AC
  Administered 2022-10-19: 5 mg via ORAL
  Filled 2022-10-19: qty 1

## 2022-10-19 MED ORDER — OXYCODONE HCL 5 MG PO TABS: 5.0000 mg | ORAL_TABLET | Freq: Four times a day (QID) | ORAL | 0 refills | Status: DC | PRN

## 2022-10-19 MED ORDER — OXYCODONE HCL 5 MG PO TABS
5.0000 mg | ORAL_TABLET | Freq: Once | ORAL | Status: AC
Start: 2022-10-19 — End: 2022-10-19
  Administered 2022-10-19: 5 mg via ORAL
  Filled 2022-10-19: qty 1

## 2022-10-19 MED ORDER — KETOROLAC TROMETHAMINE 60 MG/2ML IM SOLN
30.0000 mg | Freq: Once | INTRAMUSCULAR | Status: AC
  Administered 2022-10-19: 30 mg via INTRAMUSCULAR
  Filled 2022-10-19: qty 2

## 2022-10-19 NOTE — Discharge Instructions (Addendum)
Follow-up with orthopedic.  Recommend 1000 mg of Tylenol every 6 hours as needed for pain.  Recommend 400 mg ibuprofen every 8 hours as needed for pain.  I have written you for narcotic pain medicine for breakthrough pain.  Take that as prescribed.  That medication is sedating so please be careful with its use.  Do not do any dangerous activities including driving if you are taking that medicine.

## 2022-10-19 NOTE — ED Notes (Signed)
Pt A&OX4 ambulatory at d/c with independent steady gait. Pt verbalized understanding of d/c instructions, prescription and follow up care. 

## 2022-10-19 NOTE — ED Provider Notes (Signed)
[place] [facility] Provider Note   CSN: [id number] Arrival date & time: 10/19/22  1438     History  Chief Complaint  Patient presents with   Marletta Lor    Diane Stein is a 37 y.o. female.  Patient here with fall.  Ongoing right knee pain, right wrist pain from a fall.  Has chronic knee issues.  Has been using knee immobilizer with increasing right knee pain here recently.  Fell yesterday while not wearing the knee immobilizer.  Pain to the right knee and right wrist.  Has been ambulatory since.  Nothing makes it worse or better.  Denies hitting her head or losing consciousness.  No numbness or weakness.  The history is provided by the patient.       Home Medications Prior to Admission medications   Medication Sig Start Date End Date Taking? Authorizing Provider  oxyCODONE (ROXICODONE) 5 MG immediate release tablet Take 1 tablet (5 mg total) by mouth every 6 (six) hours as needed for up to 10 doses. 10/19/22  Yes Iliana Hutt, DO  alprazolam Prudy Feeler) 2 MG tablet Take 2 mg by mouth 3 (three) times daily as needed for anxiety.    [provider]  aspirin EC 81 MG tablet Take 1 tablet (81 mg total) by mouth 2 (two) times daily. For DVT prophylaxis for 30 days after surgery. 08/08/20   Jenne Pane, PA-C  azithromycin (ZITHROMAX) 250 MG tablet Take 2 tablets today, then 1 tablet daily for the next 4 days. 09/22/22   Molpus, John, MD  chlorpheniramine-HYDROcodone (TUSSIONEX) 10-8 MG/5ML Take 5 mLs by mouth every 12 (twelve) hours as needed for cough. 09/22/22   Molpus, John, MD  doxepin (SINEQUAN) 50 MG capsule Take 50-100 mg by mouth at bedtime as needed (sleep).     [provider]  EPINEPHrine 0.3 mg/0.3 mL IJ SOAJ injection Inject 0.3 mLs (0.3 mg total) into the muscle once as needed for up to 1 dose (anaphalaxis). 01/09/18   Loren Racer, MD  FLUoxetine (PROZAC) 20 MG capsule Take 60 mg by mouth daily.    [provider]  hydrOXYzine (ATARAX/VISTARIL) 10 MG tablet Take 10 mg by mouth See admin instructions. Take 10 mg daily, may take a second 10 mg dose as needed for anxiety    [provider]  levocetirizine (XYZAL) 5 MG tablet Take 5 mg by mouth every evening.    [provider]  melatonin 3 MG TABS tablet Take 3 mg by mouth at bedtime as needed (sleep).    [provider]  methocarbamol (ROBAXIN) 500 MG tablet Take 1 tablet (500 mg total) by mouth every 8 (eight) hours as needed for muscle spasms. 08/08/20   Jenne Pane, PA-C  omeprazole (PRILOSEC OTC) 20 MG tablet Take 1 tablet (20 mg total) by mouth daily. For gastric protection while taking pain medicines. 08/08/20 09/07/20  Jenne Pane, PA-C  ondansetron (ZOFRAN) 4 MG tablet Take 1 tablet (4 mg total) by mouth daily as needed for nausea or vomiting. 08/08/20   Jenne Pane, PA-C  oxyCODONE-acetaminophen (PERCOCET/ROXICET) 5-325 MG tablet Take 1 tablet by mouth every 6 (six) hours as needed for severe pain. 10/13/22   Terrilee Files, MD  rosuvastatin (CRESTOR) 10 MG tablet Take 10 mg by mouth at bedtime.    [provider]      Allergies    Avocado, Other, and Benadryl [diphenhydramine]    Review of Systems  Review of Systems  Physical Exam Updated Vital Signs BP 117/77 (BP Location: Right Arm)   Pulse (!) 116   Temp 98.6 F (37 C) (Oral)   Resp 16   LMP  (LMP Unknown)   SpO2 98%  Physical Exam Vitals and nursing note reviewed.  Constitutional:      General: She is not in acute distress.    Appearance: She is well-developed.  HENT:     Head: Normocephalic and atraumatic.  Eyes:     Extraocular Movements: Extraocular movements intact.     Conjunctiva/sclera: Conjunctivae normal.     Pupils: Pupils are equal, round, and reactive to light.  Cardiovascular:     Rate and Rhythm: Normal rate and regular rhythm.     Pulses: Normal pulses.     Heart sounds: No murmur heard. Pulmonary:      Effort: Pulmonary effort is normal. No respiratory distress.     Breath sounds: Normal breath sounds.  Abdominal:     Palpations: Abdomen is soft.     Tenderness: There is no abdominal tenderness.  Musculoskeletal:        General: Tenderness present. No swelling. Normal range of motion.     Cervical back: Normal range of motion and neck supple.     Comments: Tenderness to the right knee and right wrist but no obvious deformity  Skin:    General: Skin is warm and dry.     Capillary Refill: Capillary refill takes less than 2 seconds.  Neurological:     General: No focal deficit present.     Mental Status: She is alert.     Sensory: No sensory deficit.     Motor: No weakness.  Psychiatric:        Mood and Affect: Mood normal.     ED Results / Procedures / Treatments   Labs (all labs ordered are listed, but only abnormal results are displayed) Labs Reviewed - No data to display  EKG None  Radiology DG Knee Complete 4 Views Right  Result Date: 10/19/2022 CLINICAL DATA:  Pain.  Tripped over dog last night. EXAM: RIGHT KNEE - COMPLETE 4+ VIEW COMPARISON:  Right knee radiographs 10/13/2022, 12/16/2019 FINDINGS: There is again curvilinear and patchy sclerosis surrounding lucency within the lateral femoral condyle, unchanged from 10/13/2022 and mildly increased from 12/16/2019. This is again suspicious for avascular necrosis. No overlying cortical collapse is seen. No joint effusion. Joint spaces are preserved. No acute fracture or dislocation. IMPRESSION: 1. No acute fracture or dislocation. 2. 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 Electronically Signed   By: Neita Garnet M.D.   On: 10/19/2022 16:07   DG Wrist Complete Right  Result Date: 10/19/2022 CLINICAL DATA:  Pain.  Tripped over dog last night. EXAM: RIGHT WRIST - COMPLETE 3+ VIEW COMPARISON:  Right hand radiographs 04/26/2019, right wrist radiographs 02/20/2019 FINDINGS:  There is 2 mm ulnar positive variance. Joint spaces are preserved. No acute fracture is seen. No dislocation. Normal bone mineralization. IMPRESSION: No acute fracture. Electronically Signed   By: Neita Garnet M.D.   On: 10/19/2022 16:05    Procedures Procedures    Medications Ordered in ED Medications  oxyCODONE (Oxy IR/ROXICODONE) immediate release tablet 5 mg (has no administration in time range)  oxyCODONE (Oxy IR/ROXICODONE) immediate release tablet 5 mg (5 mg Oral Given 10/19/22 1506)    ED Course/ Medical Decision Making/ A&P  Medical Decision Making Amount and/or Complexity of Data Reviewed Radiology: ordered.  Risk Prescription drug management.   Domingo Sep is here with right wrist pain right knee pain after fall.  Normal vitals.  No fever.  Unremarkable exam.  She is tender in the right wrist and right knee but no obvious deformity.  Range of motion is intact.  Neurovascular neuromuscular intact.  Has chronic right knee issues.  X-rays were obtained of the right wrist and right knee and were overall unremarkable.  Will give oxycodone for breakthrough pain.  Will put her on a wrist removable splint for possible wrist sprain/contusion.  She has a knee immobilizer for the right knee.  Weightbearing as tolerated.  Will have her follow-up with orthopedics.  Recommend ice, Tylenol.  Discharged in good condition.  This chart was dictated using voice recognition software.  Despite best efforts to proofread,  errors can occur which can change the documentation meaning.         Final Clinical Impression(s) / ED Diagnoses Final diagnoses:  Acute pain of right knee  Sprain of right wrist, initial encounter    Rx / DC Orders ED Discharge Orders          Ordered    oxyCODONE (ROXICODONE) 5 MG immediate release tablet  Every 6 hours PRN        10/19/22 1547              Krishna Heuer, DO 10/19/22 1612

## 2022-10-19 NOTE — ED Notes (Signed)
Pt reports her sister will be driving her home.

## 2022-10-19 NOTE — ED Triage Notes (Signed)
Pt tripped over dog last night when she wasn't wearing knee immobilizer; c/o increased pain to RT knee

## 2022-11-14 ENCOUNTER — Emergency Department (HOSPITAL_BASED_OUTPATIENT_CLINIC_OR_DEPARTMENT_OTHER): Payer: 59

## 2022-11-14 ENCOUNTER — Other Ambulatory Visit: Payer: Self-pay

## 2022-11-14 ENCOUNTER — Encounter (HOSPITAL_BASED_OUTPATIENT_CLINIC_OR_DEPARTMENT_OTHER): Payer: Self-pay | Admitting: Emergency Medicine

## 2022-11-14 ENCOUNTER — Emergency Department (HOSPITAL_BASED_OUTPATIENT_CLINIC_OR_DEPARTMENT_OTHER)
Admission: EM | Admit: 2022-11-14 | Discharge: 2022-11-14 | Disposition: A | Discharge status: Home / Self Care | Payer: 59 | Attending: Emergency Medicine | Admitting: Emergency Medicine

## 2022-11-14 DIAGNOSIS — S8001XA Contusion of right knee, initial encounter: Secondary | ICD-10-CM | POA: Insufficient documentation

## 2022-11-14 DIAGNOSIS — M25562 Pain in left knee: Secondary | ICD-10-CM | POA: Diagnosis present

## 2022-11-14 DIAGNOSIS — Z96652 Presence of left artificial knee joint: Secondary | ICD-10-CM | POA: Insufficient documentation

## 2022-11-14 DIAGNOSIS — S8002XA Contusion of left knee, initial encounter: Secondary | ICD-10-CM | POA: Diagnosis not present

## 2022-11-14 DIAGNOSIS — W228XXA Striking against or struck by other objects, initial encounter: Secondary | ICD-10-CM | POA: Diagnosis not present

## 2022-11-14 MED ORDER — OXYCODONE HCL 5 MG PO TABS: 5.0000 mg | ORAL_TABLET | ORAL | 0 refills | Status: DC | PRN

## 2022-11-14 MED ORDER — OXYCODONE-ACETAMINOPHEN 5-325 MG PO TABS
2.0000 | ORAL_TABLET | Freq: Once | ORAL | Status: AC
  Administered 2022-11-14: 2 via ORAL
  Filled 2022-11-14: qty 2

## 2022-11-14 NOTE — ED Notes (Signed)
Pt A&Ox4 ambulatory at d/c with independent steady gait. Pt verbalized understanding of d/c instructions, prescription and follow up care. 

## 2022-11-14 NOTE — ED Triage Notes (Signed)
Pt reports she was at the pool and hit both her knees on the bottom of the pool when doing a cannonball, some edema noted in the left knee, pt is wearing a knee immobilizer on the rt knee, states that she is wearing that per her Ortho MD as of 10/22/22 d/t impending knee surgery

## 2022-11-14 NOTE — ED Provider Notes (Signed)
[place] [facility] Provider Note   CSN: [id number] Arrival date & time: 11/14/22  1700     History  Chief Complaint  Patient presents with   Knee Pain    Diane Stein is a 37 y.o. female.  Patient reports she both of her knees on the bottom of the pool after jumping into the pool doing a cannonball.  The history is provided by the patient. No language interpreter was used.  Knee Pain Location:  Knee Time since incident:  1 day Injury: no   Knee location:  L knee and R knee Pain details:    Quality:  Aching   Radiates to:  Does not radiate   Severity:  No pain   Timing:  Constant Chronicity:  New Relieved by:  Nothing Worsened by:  Nothing Ineffective treatments:  None tried Associated symptoms: no numbness        Home Medications Prior to Admission medications   Medication Sig Start Date End Date Taking? Authorizing Provider  oxyCODONE (ROXICODONE) 5 MG immediate release tablet Take 1 tablet (5 mg total) by mouth every 4 (four) hours as needed for severe pain. 11/14/22  Yes Cheron Schaumann K, PA-C  alprazolam Prudy Feeler) 2 MG tablet Take 2 mg by mouth 3 (three) times daily as needed for anxiety.    [provider]  aspirin EC 81 MG tablet Take 1 tablet (81 mg total) by mouth 2 (two) times daily. For DVT prophylaxis for 30 days after surgery. 08/08/20   Jenne Pane, PA-C  azithromycin (ZITHROMAX) 250 MG tablet Take 2 tablets today, then 1 tablet daily for the next 4 days. 09/22/22   Molpus, John, MD  chlorpheniramine-HYDROcodone (TUSSIONEX) 10-8 MG/5ML Take 5 mLs by mouth every 12 (twelve) hours as needed for cough. 09/22/22   Molpus, John, MD  doxepin (SINEQUAN) 50 MG capsule Take 50-100 mg by mouth at bedtime as needed (sleep).     [provider]  EPINEPHrine 0.3 mg/0.3 mL IJ SOAJ injection Inject 0.3 mLs (0.3 mg total) into the muscle once as needed for up to 1 dose (anaphalaxis). 01/09/18   Loren Racer,  MD  FLUoxetine (PROZAC) 20 MG capsule Take 60 mg by mouth daily.    [provider]  hydrOXYzine (ATARAX/VISTARIL) 10 MG tablet Take 10 mg by mouth See admin instructions. Take 10 mg daily, may take a second 10 mg dose as needed for anxiety    [provider]  levocetirizine (XYZAL) 5 MG tablet Take 5 mg by mouth every evening.    [provider]  melatonin 3 MG TABS tablet Take 3 mg by mouth at bedtime as needed (sleep).    [provider]  methocarbamol (ROBAXIN) 500 MG tablet Take 1 tablet (500 mg total) by mouth every 8 (eight) hours as needed for muscle spasms. 08/08/20   Jenne Pane, PA-C  omeprazole (PRILOSEC OTC) 20 MG tablet Take 1 tablet (20 mg total) by mouth daily. For gastric protection while taking pain medicines. 08/08/20 09/07/20  Jenne Pane, PA-C  ondansetron (ZOFRAN) 4 MG tablet Take 1 tablet (4 mg total) by mouth daily as needed for nausea or vomiting. 08/08/20   Jenne Pane, PA-C  oxyCODONE (ROXICODONE) 5 MG immediate release tablet Take 1 tablet (5 mg total) by mouth every 6 (six) hours as needed for up to 10 doses. 10/19/22   Curatolo, Adam, DO  rosuvastatin (CRESTOR) 10 MG tablet Take 10 mg by mouth at  bedtime.    [provider]      Allergies    Avocado, Other, and Benadryl [diphenhydramine]    Review of Systems   Review of Systems  All other systems reviewed and are negative.   Physical Exam Updated Vital Signs BP 112/76   Pulse 81   Temp 98.8 F (37.1 C) (Oral)   Resp 18   Ht 5\' 4"  (1.626 m)   Wt 74.8 kg   LMP  (LMP Unknown)   SpO2 95%   BMI 28.32 kg/m  Physical Exam Vitals and nursing note reviewed.  Constitutional:      Appearance: She is well-developed.  HENT:     Head: Normocephalic.  Pulmonary:     Effort: Pulmonary effort is normal.  Abdominal:     General: There is no distension.  Musculoskeletal:        General: Swelling and tenderness present.     Comments: Tender right knee small  area of erythema mid knee, healed incision left leg no swelling no deformities neurovascular neurosensory are intact pain with range of motion  Skin:    General: Skin is warm.  Neurological:     General: No focal deficit present.     Mental Status: She is alert and oriented to person, place, and time.  Psychiatric:        Mood and Affect: Mood normal.     ED Results / Procedures / Treatments   Labs (all labs ordered are listed, but only abnormal results are displayed) Labs Reviewed - No data to display  EKG None  Radiology DG Knee Complete 4 Views Right  Result Date: 11/14/2022 CLINICAL DATA:  Injury, bilateral knee pain EXAM: RIGHT KNEE - COMPLETE 4+ VIEW; LEFT KNEE - COMPLETE 4+ VIEW COMPARISON:  08/07/2020, 10/19/2022 FINDINGS: Right knee: Frontal, bilateral oblique, lateral views are obtained. No acute displaced fracture, subluxation, or dislocation. Stable avascular necrosis within the right lateral femoral condyle. Joint spaces are well preserved. No joint effusion. Soft tissues are unremarkable. Left knee: Frontal, bilateral oblique, lateral views of the left knee are obtained. Three component left knee arthroplasty is identified in the expected position without evidence of acute complication. No fracture, subluxation, or dislocation. No joint effusion. Soft tissues are unremarkable. IMPRESSION: 1. No evidence of acute fracture within either knee. 2. Unremarkable left knee arthroplasty. 3. Stable chronic avascular necrosis of the right lateral femoral condyle. Electronically Signed   By: Sharlet Salina M.D.   On: 11/14/2022 19:54   DG Knee Complete 4 Views Left  Result Date: 11/14/2022 CLINICAL DATA:  Injury, bilateral knee pain EXAM: RIGHT KNEE - COMPLETE 4+ VIEW; LEFT KNEE - COMPLETE 4+ VIEW COMPARISON:  08/07/2020, 10/19/2022 FINDINGS: Right knee: Frontal, bilateral oblique, lateral views are obtained. No acute displaced fracture, subluxation, or dislocation. Stable avascular  necrosis within the right lateral femoral condyle. Joint spaces are well preserved. No joint effusion. Soft tissues are unremarkable. Left knee: Frontal, bilateral oblique, lateral views of the left knee are obtained. Three component left knee arthroplasty is identified in the expected position without evidence of acute complication. No fracture, subluxation, or dislocation. No joint effusion. Soft tissues are unremarkable. IMPRESSION: 1. No evidence of acute fracture within either knee. 2. Unremarkable left knee arthroplasty. 3. Stable chronic avascular necrosis of the right lateral femoral condyle. Electronically Signed   By: Sharlet Salina M.D.   On: 11/14/2022 19:54    Procedures Procedures    Medications Ordered in ED Medications  oxyCODONE-acetaminophen (PERCOCET/ROXICET) 5-325 MG  per tablet 2 tablet (2 tablets Oral Given 11/14/22 1952)    ED Course/ Medical Decision Making/ A&P                             Medical Decision Making Patient complains of pain in both knees.  Patient has had a total knee on the left side she is scheduled to have a total knee on the right side secondary to avascular necrosis.  Amount and/or Complexity of Data Reviewed Radiology: ordered and independent interpretation performed. Decision-making details documented in ED Course.    Details: X-ray bilaterally knees, normal prostatic left knee no fracture right knee.  Risk Prescription drug management. Risk Details: Patient given medication for pain she is advised to wear her knee immobilizer she is advised to follow-up with her orthopedist Dr. Eulah Pont this week for recheck.           Final Clinical Impression(s) / ED Diagnoses Final diagnoses:  Contusion of left knee, initial encounter  Contusion of right knee, initial encounter    Rx / DC Orders ED Discharge Orders          Ordered    oxyCODONE (ROXICODONE) 5 MG immediate release tablet  Every 4 hours PRN        11/14/22 2110               Elson Areas, Cordelia Poche 11/14/22 2309    Edwin Dada P, DO 11/17/22 1403

## 2022-11-14 NOTE — Discharge Instructions (Signed)
Return if any problems.

## 2022-12-18 ENCOUNTER — Other Ambulatory Visit: Payer: Self-pay

## 2022-12-18 ENCOUNTER — Emergency Department (HOSPITAL_BASED_OUTPATIENT_CLINIC_OR_DEPARTMENT_OTHER): Payer: 59

## 2022-12-18 ENCOUNTER — Emergency Department (HOSPITAL_BASED_OUTPATIENT_CLINIC_OR_DEPARTMENT_OTHER)
Admission: EM | Admit: 2022-12-18 | Discharge: 2022-12-18 | Disposition: A | Discharge status: Home / Self Care | Payer: 59 | Attending: Emergency Medicine | Admitting: Emergency Medicine

## 2022-12-18 ENCOUNTER — Encounter (HOSPITAL_BASED_OUTPATIENT_CLINIC_OR_DEPARTMENT_OTHER): Payer: Self-pay

## 2022-12-18 DIAGNOSIS — Z23 Encounter for immunization: Secondary | ICD-10-CM | POA: Insufficient documentation

## 2022-12-18 DIAGNOSIS — S8002XA Contusion of left knee, initial encounter: Secondary | ICD-10-CM | POA: Diagnosis not present

## 2022-12-18 DIAGNOSIS — S8001XA Contusion of right knee, initial encounter: Secondary | ICD-10-CM | POA: Insufficient documentation

## 2022-12-18 DIAGNOSIS — S6991XA Unspecified injury of right wrist, hand and finger(s), initial encounter: Secondary | ICD-10-CM | POA: Diagnosis present

## 2022-12-18 DIAGNOSIS — S63501A Unspecified sprain of right wrist, initial encounter: Secondary | ICD-10-CM | POA: Diagnosis not present

## 2022-12-18 DIAGNOSIS — Z7982 Long term (current) use of aspirin: Secondary | ICD-10-CM | POA: Diagnosis not present

## 2022-12-18 DIAGNOSIS — W19XXXA Unspecified fall, initial encounter: Secondary | ICD-10-CM

## 2022-12-18 DIAGNOSIS — W1830XA Fall on same level, unspecified, initial encounter: Secondary | ICD-10-CM | POA: Diagnosis not present

## 2022-12-18 MED ORDER — IBUPROFEN 600 MG PO TABS: 600.0000 mg | ORAL_TABLET | Freq: Four times a day (QID) | ORAL | 0 refills | Status: AC | PRN

## 2022-12-18 MED ORDER — TETANUS-DIPHTH-ACELL PERTUSSIS 5-2.5-18.5 LF-MCG/0.5 IM SUSY
0.5000 mL | PREFILLED_SYRINGE | Freq: Once | INTRAMUSCULAR | Status: AC
  Administered 2022-12-18: 0.5 mL via INTRAMUSCULAR
  Filled 2022-12-18: qty 0.5

## 2022-12-18 MED ORDER — OXYCODONE-ACETAMINOPHEN 5-325 MG PO TABS
2.0000 | ORAL_TABLET | Freq: Once | ORAL | Status: AC
  Administered 2022-12-18: 2 via ORAL
  Filled 2022-12-18: qty 2

## 2022-12-18 NOTE — ED Provider Notes (Signed)
[place] [facility] Provider Note   CSN: [id number] Arrival date & time: 12/18/22  1856     History  Chief Complaint  Patient presents with   Knee Pain   Wrist Pain    Diane Stein is a 37 y.o. female with past medical history anxiety, arthritis who presents to the ED after a mechanical fall in a parking lot.  She states that she accidentally slipped on a kneeling pain in parking lot landing on both knees and injuring her right wrist.  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  States that she also does have some chronic issues with the ligaments in her right wrist that has not regularly followed by orthopedics for this.  States that pain is most severe to her right wrist.  Last tetanus 8 years ago.  No head injury or LOC.    Home Medications Prior to Admission medications   Medication Sig Start Date End Date Taking? Authorizing Provider  ibuprofen (ADVIL) 600 MG tablet Take 1 tablet (600 mg total) by mouth every 6 (six) hours as needed for up to 3 days for mild pain or moderate pain. 12/18/22 12/21/22 Yes Jair Lindblad L, PA-C  alprazolam Prudy Feeler) 2 MG tablet Take 2 mg by mouth 3 (three) times daily as needed for anxiety.    [provider]  aspirin EC 81 MG tablet Take 1 tablet (81 mg total) by mouth 2 (two) times daily. For DVT prophylaxis for 30 days after surgery. 08/08/20   Jenne Pane, PA-C  azithromycin (ZITHROMAX) 250 MG tablet Take 2 tablets today, then 1 tablet daily for the next 4 days. 09/22/22   Molpus, John, MD  chlorpheniramine-HYDROcodone (TUSSIONEX) 10-8 MG/5ML Take 5 mLs by mouth every 12 (twelve) hours as needed for cough. 09/22/22   Molpus, John, MD  doxepin (SINEQUAN) 50 MG capsule Take 50-100 mg by mouth at bedtime as needed (sleep).     [provider]  EPINEPHrine 0.3 mg/0.3 mL IJ SOAJ injection Inject  0.3 mLs (0.3 mg total) into the muscle once as needed for up to 1 dose (anaphalaxis). 01/09/18   Loren Racer, MD  FLUoxetine (PROZAC) 20 MG capsule Take 60 mg by mouth daily.    [provider]  hydrOXYzine (ATARAX/VISTARIL) 10 MG tablet Take 10 mg by mouth See admin instructions. Take 10 mg daily, may take a second 10 mg dose as needed for anxiety    [provider]  levocetirizine (XYZAL) 5 MG tablet Take 5 mg by mouth every evening.    [provider]  melatonin 3 MG TABS tablet Take 3 mg by mouth at bedtime as needed (sleep).    [provider]  methocarbamol (ROBAXIN) 500 MG tablet Take 1 tablet (500 mg total) by mouth every 8 (eight) hours as needed for muscle spasms. 08/08/20   Jenne Pane, PA-C  omeprazole (PRILOSEC OTC) 20 MG tablet Take 1 tablet (20 mg total) by mouth daily. For gastric protection while taking pain medicines. 08/08/20 09/07/20  Jenne Pane, PA-C  ondansetron (ZOFRAN) 4 MG tablet Take 1 tablet (4 mg total) by mouth daily as needed for nausea or vomiting. 08/08/20   Jenne Pane, PA-C  oxyCODONE (ROXICODONE) 5 MG immediate release tablet Take 1 tablet (5 mg total) by mouth every 6 (six) hours as needed for up to 10 doses.  10/19/22   Curatolo, Adam, DO  oxyCODONE (ROXICODONE) 5 MG immediate release tablet Take 1 tablet (5 mg total) by mouth every 4 (four) hours as needed for severe pain. 11/14/22   Elson Areas, PA-C  rosuvastatin (CRESTOR) 10 MG tablet Take 10 mg by mouth at bedtime.    [provider]      Allergies    Avocado, Other, and Benadryl [diphenhydramine]    Review of Systems   Review of Systems  All other systems reviewed and are negative.   Physical Exam Updated Vital Signs BP 121/76 (BP Location: Left Arm)   Pulse 92   Temp 98.1 F (36.7 C) (Oral)   Resp 16   Wt 72.1 kg   LMP  (LMP Unknown)   SpO2 100%   BMI 27.29 kg/m  Physical Exam Vitals and nursing note reviewed.  Constitutional:       General: She is not in acute distress.    Appearance: Normal appearance.  HENT:     Head: Normocephalic and atraumatic.     Mouth/Throat:     Mouth: Mucous membranes are moist.  Eyes:     Conjunctiva/sclera: Conjunctivae normal.  Cardiovascular:     Rate and Rhythm: Normal rate and regular rhythm.     Heart sounds: No murmur heard. Pulmonary:     Effort: Pulmonary effort is normal.     Breath sounds: Normal breath sounds.  Abdominal:     General: Abdomen is flat.     Palpations: Abdomen is soft.     Tenderness: There is no abdominal tenderness.  Musculoskeletal:     Cervical back: Neck supple.     Comments: Small abrasion over the left anterior knee with previous surgical scarring, no wounds to the right anterior knee, both knees with stable joint send patient neurovascularly intact distally, range of motion grossly intact, no tenderness over bilateral femurs or lower legs; diffuse swelling over the right wrist with limited range of motion, 2+ radial pulse, sensation intact distally, soft compartments of the upper extremity, no overlying wounds  Skin:    General: Skin is warm and dry.     Capillary Refill: Capillary refill takes less than 2 seconds.  Neurological:     Mental Status: She is alert. Mental status is at baseline.  Psychiatric:        Behavior: Behavior normal.     ED Results / Procedures / Treatments   Labs (all labs ordered are listed, but only abnormal results are displayed) Labs Reviewed - No data to display  EKG None  Radiology DG Wrist Complete Right  Result Date: 12/18/2022 CLINICAL DATA:  Fall EXAM: RIGHT WRIST - COMPLETE 3+ VIEW COMPARISON:  10/19/2022 FINDINGS: There is no evidence of fracture or dislocation. There is no evidence of arthropathy or other focal bone abnormality. Soft tissues are unremarkable. IMPRESSION: Negative. Electronically Signed   By: Charlett Nose M.D.   On: 12/18/2022 20:24   DG Knee Complete 4 Views Right  Result Date:  12/18/2022 CLINICAL DATA:  Fall, knee pain EXAM: RIGHT KNEE - COMPLETE 4+ VIEW COMPARISON:  None Available. FINDINGS: No acute bony abnormality. Specifically, no fracture, subluxation, or dislocation. No joint effusion. 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 IMPRESSION: No acute bony abnormality. Electronically Signed   By: Charlett Nose M.D.   On: 12/18/2022 20:24   DG Knee Complete 4 Views Left  Result Date: 12/18/2022 CLINICAL DATA:  Fall, knee pain EXAM: LEFT KNEE - COMPLETE  4+ VIEW COMPARISON:  11/14/2022 FINDINGS: Changes of right knee replacement. No hardware complicating feature. No acute bony abnormality. Specifically, no fracture, subluxation, or dislocation. No joint effusion. IMPRESSION: Right knee replacement.  No acute bony abnormality. Electronically Signed   By: Charlett Nose M.D.   On: 12/18/2022 20:22    Procedures Procedures    Medications Ordered in ED Medications  oxyCODONE-acetaminophen (PERCOCET/ROXICET) 5-325 MG per tablet 2 tablet (has no administration in time range)  Tdap (BOOSTRIX) injection 0.5 mL (has no administration in time range)    ED Course/ Medical Decision Making/ A&P                             Medical Decision Making Amount and/or Complexity of Data Reviewed Radiology: ordered. Decision-making details documented in ED Course.  Risk Prescription drug management.   Medical Decision Making:   ELAN MCELVAIN is a 37 y.o. female who presented to the ED today with fall detailed above.    Patient's presentation is complicated by their history of chronic orthopedic issues.  Complete initial physical exam performed, notably the patient was NVID with soft compartments. Swelling to right wrist as above. Knee joints stable.    Reviewed and confirmed nursing documentation for past medical history, family history, social history.    Initial Assessment:   With the patient's presentation, differential  diagnosis includes but is not limited to contusion, abrasion, sprain, strain, fracture, dislocation, compartment syndrome. This is most consistent with an acute complicated illness  Initial Plan:  X-rays to evaluate for bony pathology Tdap updated Pain control Objective evaluation as below reviewed   Initial Study Results:   Radiology:  All images reviewed independently. Agree with radiology report at this time.   DG Wrist Complete Right  Result Date: 12/18/2022 CLINICAL DATA:  Fall EXAM: RIGHT WRIST - COMPLETE 3+ VIEW COMPARISON:  10/19/2022 FINDINGS: There is no evidence of fracture or dislocation. There is no evidence of arthropathy or other focal bone abnormality. Soft tissues are unremarkable. IMPRESSION: Negative. Electronically Signed   By: Charlett Nose M.D.   On: 12/18/2022 20:24   DG Knee Complete 4 Views Right  Result Date: 12/18/2022 CLINICAL DATA:  Fall, knee pain EXAM: RIGHT KNEE - COMPLETE 4+ VIEW COMPARISON:  None Available. FINDINGS: No acute bony abnormality. Specifically, no fracture, subluxation, or dislocation. No joint effusion. 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 IMPRESSION: No acute bony abnormality. Electronically Signed   By: Charlett Nose M.D.   On: 12/18/2022 20:24   DG Knee Complete 4 Views Left  Result Date: 12/18/2022 CLINICAL DATA:  Fall, knee pain EXAM: LEFT KNEE - COMPLETE 4+ VIEW COMPARISON:  11/14/2022 FINDINGS: Changes of right knee replacement. No hardware complicating feature. No acute bony abnormality. Specifically, no fracture, subluxation, or dislocation. No joint effusion. IMPRESSION: Right knee replacement.  No acute bony abnormality. Electronically Signed   By: Charlett Nose M.D.   On: 12/18/2022 20:22      Final Assessment and Plan:   37 year old female presents to the ED for evaluation following mechanical fall.  Mostly complaining of pain and swelling to the right wrist but also pain to both knees  which she fell on.  She has a vascularly intact to all extremities.  She has a small abrasion to the left knee but both knee joints are stable without obvious deformity.  X-rays of the knees and wrist negative.  Patient is soft compartments,  she is able to ambulate.  Mostly concerned secondary to swelling of the right wrist but suspect a significant sprain.  Will place in wrist brace and have her follow-up closely with orthopedics for any continued symptoms.  PDMP reviewed and the patient received narcotic pain medication prescription yesterday stable sent home with ibuprofen to help with pain/swelling and have her also take her home pain medication as prescribed by her outpatient team.  Strict ED return precautions given, all questions answered, and stable for discharge.   Clinical Impression:  1. Sprain of right wrist, initial encounter   2. Fall, initial encounter   3. Contusion of left knee, initial encounter   4. Contusion of right knee, initial encounter      Discharge           Final Clinical Impression(s) / ED Diagnoses Final diagnoses:  Sprain of right wrist, initial encounter  Fall, initial encounter  Contusion of left knee, initial encounter  Contusion of right knee, initial encounter    Rx / DC Orders ED Discharge Orders          Ordered    ibuprofen (ADVIL) 600 MG tablet  Every 6 hours PRN        12/18/22 2111              Tonette Lederer, PA-C 12/18/22 2115    Virgina Norfolk, DO 12/18/22 2256

## 2022-12-18 NOTE — Discharge Instructions (Addendum)
Thank you for letting us take care of you today.  The x-rays of both of your knees and your right wrist were negative for any fractures.  With the amount of swelling to your wrist, we placed him in a brace and I recommend following up with orthopedics for any continued symptoms over the next week.  With the cut to your knee, your tetanus shot was updated.  For new or worsening symptoms or injury, return to the nearest ED for reevaluation.

## 2022-12-18 NOTE — ED Triage Notes (Signed)
Pt arrives with c/o bilateral knee pain and right wrist pain after she fell today. Pt denies hitting her head or neck pain.

## 2022-12-29 ENCOUNTER — Other Ambulatory Visit: Payer: Self-pay

## 2022-12-29 ENCOUNTER — Emergency Department (HOSPITAL_BASED_OUTPATIENT_CLINIC_OR_DEPARTMENT_OTHER): Payer: 59

## 2022-12-29 ENCOUNTER — Emergency Department (HOSPITAL_BASED_OUTPATIENT_CLINIC_OR_DEPARTMENT_OTHER)
Admission: EM | Admit: 2022-12-29 | Discharge: 2022-12-29 | Disposition: A | Discharge status: Home / Self Care | Payer: 59 | Attending: Emergency Medicine | Admitting: Emergency Medicine

## 2022-12-29 DIAGNOSIS — R5383 Other fatigue: Secondary | ICD-10-CM | POA: Insufficient documentation

## 2022-12-29 DIAGNOSIS — Z20822 Contact with and (suspected) exposure to covid-19: Secondary | ICD-10-CM | POA: Insufficient documentation

## 2022-12-29 DIAGNOSIS — R059 Cough, unspecified: Secondary | ICD-10-CM | POA: Diagnosis present

## 2022-12-29 DIAGNOSIS — J069 Acute upper respiratory infection, unspecified: Secondary | ICD-10-CM | POA: Insufficient documentation

## 2022-12-29 DIAGNOSIS — R5381 Other malaise: Secondary | ICD-10-CM | POA: Diagnosis not present

## 2022-12-29 DIAGNOSIS — R052 Subacute cough: Secondary | ICD-10-CM | POA: Insufficient documentation

## 2022-12-29 LAB — CBC WITH DIFFERENTIAL/PLATELET
Abs Immature Granulocytes: 0.04 10*3/uL (ref 0.00–0.07)
Basophils Absolute: 0 10*3/uL (ref 0.0–0.1)
Basophils Relative: 0 %
Eosinophils Absolute: 0 10*3/uL (ref 0.0–0.5)
Eosinophils Relative: 0 %
HCT: 41.8 % (ref 36.0–46.0)
Hemoglobin: 14.2 g/dL (ref 12.0–15.0)
Immature Granulocytes: 0 %
Lymphocytes Relative: 24 %
Lymphs Abs: 2.3 10*3/uL (ref 0.7–4.0)
MCH: 30.2 pg (ref 26.0–34.0)
MCHC: 34 g/dL (ref 30.0–36.0)
MCV: 88.9 fL (ref 80.0–100.0)
Monocytes Absolute: 0.6 10*3/uL (ref 0.1–1.0)
Monocytes Relative: 6 %
Neutro Abs: 6.5 10*3/uL (ref 1.7–7.7)
Neutrophils Relative %: 70 %
Platelets: 210 10*3/uL (ref 150–400)
RBC: 4.7 MIL/uL (ref 3.87–5.11)
RDW: 13.2 % (ref 11.5–15.5)
WBC: 9.5 10*3/uL (ref 4.0–10.5)
nRBC: 0 % (ref 0.0–0.2)

## 2022-12-29 LAB — TROPONIN I (HIGH SENSITIVITY): Troponin I (High Sensitivity): <2 ng/L (ref <18)

## 2022-12-29 LAB — COMPREHENSIVE METABOLIC PANEL
ALT: 12 U/L (ref 0–44)
AST: 15 U/L (ref 15–41)
Albumin: 4 g/dL (ref 3.5–5.0)
Alkaline Phosphatase: 76 U/L (ref 38–126)
Anion gap: 13 (ref 5–15)
BUN: 6 mg/dL (ref 6–20)
CO2: 21 mmol/L — ABNORMAL LOW (ref 22–32)
Calcium: 8.9 mg/dL (ref 8.9–10.3)
Chloride: 104 mmol/L (ref 98–111)
Creatinine, Ser: 0.72 mg/dL (ref 0.44–1.00)
GFR, Estimated: >60 mL/min (ref >60)
Glucose, Bld: 95 mg/dL (ref 70–99)
Potassium: 2.9 mmol/L — ABNORMAL LOW (ref 3.5–5.1)
Sodium: 138 mmol/L (ref 135–145)
Total Bilirubin: 0.3 mg/dL (ref 0.3–1.2)
Total Protein: 7.4 g/dL (ref 6.5–8.1)

## 2022-12-29 LAB — D-DIMER, QUANTITATIVE: D-Dimer, Quant: 0.52 ug/mL-FEU — ABNORMAL HIGH (ref 0.00–0.50)

## 2022-12-29 LAB — SARS CORONAVIRUS 2 BY RT PCR: SARS Coronavirus 2 by RT PCR: NEGATIVE

## 2022-12-29 MED ORDER — BENZONATATE 100 MG PO CAPS
100.0000 mg | ORAL_CAPSULE | Freq: Once | ORAL | Status: AC
  Administered 2022-12-29: 100 mg via ORAL
  Filled 2022-12-29: qty 1

## 2022-12-29 MED ORDER — ACETAMINOPHEN 500 MG PO TABS
1000.0000 mg | ORAL_TABLET | Freq: Once | ORAL | Status: AC
  Administered 2022-12-29: 1000 mg via ORAL
  Filled 2022-12-29: qty 2

## 2022-12-29 MED ORDER — POTASSIUM CHLORIDE CRYS ER 20 MEQ PO TBCR: 20.0000 meq | EXTENDED_RELEASE_TABLET | Freq: Two times a day (BID) | ORAL | 0 refills | Status: DC

## 2022-12-29 MED ORDER — POTASSIUM CHLORIDE CRYS ER 20 MEQ PO TBCR
40.0000 meq | EXTENDED_RELEASE_TABLET | Freq: Once | ORAL | Status: AC
  Administered 2022-12-29: 40 meq via ORAL

## 2022-12-29 MED ORDER — AZITHROMYCIN 250 MG PO TABS: ORAL_TABLET | ORAL | 0 refills | Status: DC

## 2022-12-29 MED ORDER — IOHEXOL 350 MG/ML SOLN
75.0000 mL | Freq: Once | INTRAVENOUS | Status: AC | PRN
  Administered 2022-12-29: 75 mL via INTRAVENOUS

## 2022-12-29 NOTE — ED Notes (Signed)
EDP at [facility] 

## 2022-12-29 NOTE — ED Triage Notes (Signed)
Patient presents to ED via POV from home. Here with dry cough x 3 days.

## 2022-12-29 NOTE — ED Notes (Signed)
Pt. States she's having chest pain/tightness 9/10 EDP aware.

## 2022-12-29 NOTE — ED Notes (Signed)
To xray. C/o cough and pain.

## 2022-12-29 NOTE — ED Provider Notes (Signed)
[place] [facility] Provider Note   CSN: [id number] Arrival date & time: 12/29/22  1442     History Chief Complaint  Patient presents with   Cough    HPI ALEYDIS BUNNER is a 37 y.o. female presenting for shortness of breath, cough, fatigue, palpitations, tachycardia, generalized malaise. Mhx of chest pain, palpitations pSVT and recurrent CAP coming in with cough and tachycardia x3 days.   Patient's recorded medical, surgical, social, medication list and allergies were reviewed in the Snapshot window as part of the initial history.   Review of Systems   Review of Systems  Constitutional:  Negative for chills and fever.  HENT:  Negative for ear pain and sore throat.   Eyes:  Negative for pain and visual disturbance.  Respiratory:  Positive for cough and shortness of breath.   Cardiovascular:  Positive for chest pain. Negative for palpitations.  Gastrointestinal:  Negative for abdominal pain and vomiting.  Genitourinary:  Negative for dysuria and hematuria.  Musculoskeletal:  Negative for arthralgias and back pain.  Skin:  Negative for color change and rash.  Neurological:  Negative for seizures and syncope.  All other systems reviewed and are negative.   Physical Exam Updated Vital Signs BP 122/78   Pulse (!) 104   Temp 98.5 F (36.9 C) (Oral)   Resp 19   LMP  (LMP Unknown)   SpO2 98%  Physical Exam Vitals and nursing note reviewed.  Constitutional:      General: She is not in acute distress.    Appearance: She is well-developed.  HENT:     Head: Normocephalic and atraumatic.  Eyes:     Conjunctiva/sclera: Conjunctivae normal.  Cardiovascular:     Rate and Rhythm: Normal rate and regular rhythm.     Heart sounds: No murmur heard. Pulmonary:     Effort: Pulmonary effort is normal. No respiratory distress.     Breath sounds: Normal breath sounds.  Abdominal:     General: There is no distension.     Palpations: Abdomen  is soft.     Tenderness: There is no abdominal tenderness. There is no right CVA tenderness or left CVA tenderness.  Musculoskeletal:        General: No swelling or tenderness. Normal range of motion.     Cervical back: Neck supple.  Skin:    General: Skin is warm and dry.  Neurological:     General: No focal deficit present.     Mental Status: She is alert and oriented to person, place, and time. Mental status is at baseline.     Cranial Nerves: No cranial nerve deficit.      ED Course/ Medical Decision Making/ A&P    Procedures Procedures   Medications Ordered in ED Medications  acetaminophen (TYLENOL) tablet 1,000 mg (1,000 mg Oral Given 12/29/22 1628)  benzonatate (TESSALON) capsule 100 mg (100 mg Oral Given 12/29/22 1628)  potassium chloride SA (KLOR-CON M) CR tablet 40 mEq (40 mEq Oral Given 12/29/22 1628)  iohexol (OMNIPAQUE) 350 MG/ML injection 75 mL (75 mLs Intravenous Contrast Given 12/29/22 1750)    Medical Decision Making:    DAYZHA FLATLEY is a 37 y.o. female who presented to the ED today with cough and fever detailed above.     Patient's presentation is complicated by their history of medical conditions listed above.  Patient placed on continuous vitals and telemetry monitoring while in ED which was reviewed periodically.   Complete initial physical  exam performed, notably the patient  was hemodynamically stable no acute distress.  Tachycardic at 110 on initial presentation.      Reviewed and confirmed nursing documentation for past medical history, family history, social history.    Initial Assessment:   With the patient's presentation of cough and congestion for the last 3 days, most likely diagnosis is pneumonia versus viral syndrome. Other diagnoses were considered including (but not limited to) ACS, PE, pneumothorax. These are considered less likely due to history of present illness and physical exam findings.   This is most consistent with an acute  life/limb threatening illness complicated by underlying chronic conditions. Notably, concerning PE, patient is PERC positive because of her heart rate.  However she is low Wells score and has a very low pretest probability based on lack of any active chest pain or shortness of breath.  With coughing her presenting chief complaint, clinically relevant PE would be grossly less likely.   Initial Plan:  Screening labs including CBC and Metabolic panel to evaluate for infectious or metabolic etiology of disease.  Urinalysis with reflex culture ordered to evaluate for UTI or relevant urologic/nephrologic pathology.  CXR to evaluate for structural/infectious intrathoracic pathology.  EKG to evaluate for cardiac pathology. Objective evaluation as below reviewed with plan for close reassessment  Initial Study Results:   Laboratory  All laboratory results reviewed without evidence of clinically relevant pathology.    EKG EKG was reviewed independently. Rate, rhythm, axis, intervals all examined and without medically relevant abnormality. ST segments without concerns for elevations.    Radiology  All images reviewed independently. Agree with radiology report at this time.   CT Angio Chest Pulmonary Embolism (PE) W or WO Contrast  Result Date: 12/29/2022 CLINICAL DATA:  Dry cough, post-tussive emesis, chest pain and tightness. EXAM: CT ANGIOGRAPHY CHEST WITH CONTRAST TECHNIQUE: Multidetector CT imaging of the chest was performed using the standard protocol during bolus administration of intravenous contrast. Multiplanar CT image reconstructions and MIPs were obtained to evaluate the vascular anatomy. RADIATION DOSE REDUCTION: This exam was performed according to the departmental dose-optimization program which includes automated exposure control, adjustment of the mA and/or kV according to patient size and/or use of iterative reconstruction technique. CONTRAST:  75mL OMNIPAQUE IOHEXOL 350 MG/ML SOLN  COMPARISON:  08/20/2016 FINDINGS: Cardiovascular: No filling defect is identified in the pulmonary arterial tree to suggest pulmonary embolus. Mediastinum/Nodes: Right supraclavicular node 0.6 cm in short axis, image 14 series 5. Right upper paratracheal node 0.7 cm in short axis, image 25 series 5. Right infrahilar node 0.8 cm in short axis, image 46 series 5. Right eccentric subcarinal node 0.9 cm in short axis, image 45 series 5. Right hilar lymph node 0.8 cm in short axis, image 55 series 8. Overall the perihilar nodal tissue is notable but no pathologic adenopathy is observed. Lungs/Pleura: Mild dependent atelectasis in both lower lobes. Mild airway thickening in the right lower lobe. Upper Abdomen: Unremarkable Musculoskeletal: Unremarkable Review of the MIP images confirms the above findings. IMPRESSION: 1. No filling defect is identified in the pulmonary arterial tree to suggest pulmonary embolus. 2. Mild airway thickening in the right lower lobe, potentially from bronchitis. 3. Mild dependent atelectasis in both lower lobes. 4. Mildly prominent right perihilar nodal tissue, possibly reactive, but no pathologic adenopathy is observed. Electronically Signed   By: Gaylyn Rong M.D.   On: 12/29/2022 18:16   DG Chest 2 View  Result Date: 12/29/2022 CLINICAL DATA:  Cough x3 days EXAM: CHEST -  2 VIEW COMPARISON:  Previous studies including the examination of 09/22/2022 FINDINGS: The heart size and mediastinal contours are within normal limits. Both lungs are clear. The visualized skeletal structures are unremarkable. IMPRESSION: No active cardiopulmonary disease. Electronically Signed   By: Ernie Avena M.D.   On: 12/29/2022 16:13     Reassessment and Plan:   Patient's history present illness physical findings are consistent with any acute pathology.  CT scan with potential developing acute bronchitis. 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 She would like to proceed with antimicrobials.  Will start azithromycin with plan for patient be reassessed by her PCP within 72 hours.   Disposition:  I have considered need for hospitalization, however, considering all of the above, I believe this patient is stable for discharge at this time.  Patient/family educated about specific return precautions for given chief complaint and symptoms.  Patient/family educated about follow-up with PCP.     Patient/family expressed understanding of return precautions and need for follow-up. Patient spoken to regarding all imaging and laboratory results and appropriate follow up for these results. All education provided in verbal form with additional information in written form. Time was allowed for answering of patient questions. Patient discharged.    Emergency Department Medication Summary:   Medications  acetaminophen (TYLENOL) tablet 1,000 mg (1,000 mg Oral Given 12/29/22 1628)  benzonatate (TESSALON) capsule 100 mg (100 mg Oral Given 12/29/22 1628)  potassium chloride SA (KLOR-CON M) CR tablet 40 mEq (40 mEq Oral Given 12/29/22 1628)  iohexol (OMNIPAQUE) 350 MG/ML injection 75 mL (75 mLs Intravenous Contrast Given 12/29/22 1750)         Clinical Impression:  1. Subacute cough   2. Upper respiratory tract infection, unspecified type      Discharge   Final Clinical Impression(s) / ED Diagnoses Final diagnoses:  Subacute cough  Upper respiratory tract infection, unspecified type    Rx / DC Orders ED Discharge Orders          Ordered    potassium chloride SA (KLOR-CON M) 20 MEQ tablet  2 times daily        12/29/22 1619    azithromycin (ZITHROMAX) 250 MG tablet        12/29/22 1857              Glyn Ade, MD 12/29/22 1904

## 2022-12-29 NOTE — ED Notes (Signed)
Patient transported to CT 

## 2022-12-29 NOTE — ED Notes (Signed)
Pt alert, NAD, calm, interactive. Here for cough. H/o asthma and tachycardia. Last albuterol this am. No relief with albuterol. Endorses post tussive emesis and stress incontinence. Tachycardic at baseline, 110-115. Takes xanax for the same. Denies fever.

## 2022-12-31 ENCOUNTER — Emergency Department (HOSPITAL_BASED_OUTPATIENT_CLINIC_OR_DEPARTMENT_OTHER): Payer: 59

## 2022-12-31 ENCOUNTER — Emergency Department (HOSPITAL_BASED_OUTPATIENT_CLINIC_OR_DEPARTMENT_OTHER)
Admission: EM | Admit: 2022-12-31 | Discharge: 2023-01-01 | Disposition: A | Discharge status: Home / Self Care | Payer: 59 | Attending: Emergency Medicine | Admitting: Emergency Medicine

## 2022-12-31 ENCOUNTER — Encounter (HOSPITAL_BASED_OUTPATIENT_CLINIC_OR_DEPARTMENT_OTHER): Payer: Self-pay | Admitting: Radiology

## 2022-12-31 ENCOUNTER — Other Ambulatory Visit: Payer: Self-pay

## 2022-12-31 DIAGNOSIS — Z7982 Long term (current) use of aspirin: Secondary | ICD-10-CM | POA: Diagnosis not present

## 2022-12-31 DIAGNOSIS — R0602 Shortness of breath: Secondary | ICD-10-CM | POA: Insufficient documentation

## 2022-12-31 DIAGNOSIS — R059 Cough, unspecified: Secondary | ICD-10-CM | POA: Insufficient documentation

## 2022-12-31 DIAGNOSIS — Z20822 Contact with and (suspected) exposure to covid-19: Secondary | ICD-10-CM | POA: Insufficient documentation

## 2022-12-31 DIAGNOSIS — R0789 Other chest pain: Secondary | ICD-10-CM | POA: Diagnosis not present

## 2022-12-31 DIAGNOSIS — J40 Bronchitis, not specified as acute or chronic: Secondary | ICD-10-CM

## 2022-12-31 LAB — COMPREHENSIVE METABOLIC PANEL
ALT: 8 U/L (ref 0–44)
AST: 16 U/L (ref 15–41)
Albumin: 4.5 g/dL (ref 3.5–5.0)
Alkaline Phosphatase: 74 U/L (ref 38–126)
Anion gap: 11 (ref 5–15)
BUN: 10 mg/dL (ref 6–20)
CO2: 23 mmol/L (ref 22–32)
Calcium: 9.7 mg/dL (ref 8.9–10.3)
Chloride: 106 mmol/L (ref 98–111)
Creatinine, Ser: 0.66 mg/dL (ref 0.44–1.00)
GFR, Estimated: >60 mL/min (ref >60)
Glucose, Bld: 159 mg/dL — ABNORMAL HIGH (ref 70–99)
Potassium: 3.6 mmol/L (ref 3.5–5.1)
Sodium: 140 mmol/L (ref 135–145)
Total Bilirubin: 0.2 mg/dL — ABNORMAL LOW (ref 0.3–1.2)
Total Protein: 7.3 g/dL (ref 6.5–8.1)

## 2022-12-31 LAB — CBC WITH DIFFERENTIAL/PLATELET
Abs Immature Granulocytes: 0.04 10*3/uL (ref 0.00–0.07)
Basophils Absolute: 0 10*3/uL (ref 0.0–0.1)
Basophils Relative: 0 %
Eosinophils Absolute: 0 10*3/uL (ref 0.0–0.5)
Eosinophils Relative: 0 %
HCT: 40.9 % (ref 36.0–46.0)
Hemoglobin: 14.1 g/dL (ref 12.0–15.0)
Immature Granulocytes: 0 %
Lymphocytes Relative: 4 %
Lymphs Abs: 0.5 10*3/uL — ABNORMAL LOW (ref 0.7–4.0)
MCH: 30.6 pg (ref 26.0–34.0)
MCHC: 34.5 g/dL (ref 30.0–36.0)
MCV: 88.7 fL (ref 80.0–100.0)
Monocytes Absolute: 0 10*3/uL — ABNORMAL LOW (ref 0.1–1.0)
Monocytes Relative: 0 %
Neutro Abs: 10.7 10*3/uL — ABNORMAL HIGH (ref 1.7–7.7)
Neutrophils Relative %: 96 %
Platelets: 246 10*3/uL (ref 150–400)
RBC: 4.61 MIL/uL (ref 3.87–5.11)
RDW: 13.3 % (ref 11.5–15.5)
WBC: 11.2 10*3/uL — ABNORMAL HIGH (ref 4.0–10.5)
nRBC: 0 % (ref 0.0–0.2)

## 2022-12-31 LAB — RESP PANEL BY RT-PCR (RSV, FLU A&B, COVID)  RVPGX2
Influenza A by PCR: NEGATIVE
Influenza B by PCR: NEGATIVE
Resp Syncytial Virus by PCR: NEGATIVE
SARS Coronavirus 2 by RT PCR: NEGATIVE

## 2022-12-31 MED ORDER — IPRATROPIUM-ALBUTEROL 0.5-2.5 (3) MG/3ML IN SOLN
3.0000 mL | Freq: Once | RESPIRATORY_TRACT | Status: AC
  Administered 2022-12-31: 3 mL via RESPIRATORY_TRACT
  Filled 2022-12-31: qty 3

## 2022-12-31 NOTE — ED Provider Notes (Signed)
[place] [facility] Provider Note   CSN: [id number] Arrival date & time: 12/31/22  1756     History {Add pertinent medical, surgical, social history, OB history to HPI:1} Chief Complaint  Patient presents with   Shortness of Breath   Cough    Diane Stein is a 37 y.o. female.   Shortness of Breath Associated symptoms: cough   Cough Associated symptoms: shortness of breath        Home Medications Prior to Admission medications   Medication Sig Start Date End Date Taking? Authorizing Provider  alprazolam Prudy Feeler) 2 MG tablet Take 2 mg by mouth 3 (three) times daily as needed for anxiety.    [provider]  aspirin EC 81 MG tablet Take 1 tablet (81 mg total) by mouth 2 (two) times daily. For DVT prophylaxis for 30 days after surgery. 08/08/20   Jenne Pane, PA-C  azithromycin (ZITHROMAX) 250 MG tablet Take 2 tablets today, then 1 tablet daily for the next 4 days. 12/29/22   Glyn Ade, MD  chlorpheniramine-HYDROcodone (TUSSIONEX) 10-8 MG/5ML Take 5 mLs by mouth every 12 (twelve) hours as needed for cough. 09/22/22   Molpus, John, MD  doxepin (SINEQUAN) 50 MG capsule Take 50-100 mg by mouth at bedtime as needed (sleep).     [provider]  EPINEPHrine 0.3 mg/0.3 mL IJ SOAJ injection Inject 0.3 mLs (0.3 mg total) into the muscle once as needed for up to 1 dose (anaphalaxis). 01/09/18   Loren Racer, MD  FLUoxetine (PROZAC) 20 MG capsule Take 60 mg by mouth daily.    [provider]  hydrOXYzine (ATARAX/VISTARIL) 10 MG tablet Take 10 mg by mouth See admin instructions. Take 10 mg daily, may take a second 10 mg dose as needed for anxiety    [provider]  levocetirizine (XYZAL) 5 MG tablet Take 5 mg by mouth every evening.    [provider]  melatonin 3 MG TABS tablet Take 3 mg by mouth at bedtime as needed (sleep).    [provider]  methocarbamol (ROBAXIN) 500 MG tablet  Take 1 tablet (500 mg total) by mouth every 8 (eight) hours as needed for muscle spasms. 08/08/20   Jenne Pane, PA-C  omeprazole (PRILOSEC OTC) 20 MG tablet Take 1 tablet (20 mg total) by mouth daily. For gastric protection while taking pain medicines. 08/08/20 09/07/20  Jenne Pane, PA-C  ondansetron (ZOFRAN) 4 MG tablet Take 1 tablet (4 mg total) by mouth daily as needed for nausea or vomiting. 08/08/20   Jenne Pane, PA-C  oxyCODONE (ROXICODONE) 5 MG immediate release tablet Take 1 tablet (5 mg total) by mouth every 6 (six) hours as needed for up to 10 doses. 10/19/22   Curatolo, Adam, DO  oxyCODONE (ROXICODONE) 5 MG immediate release tablet Take 1 tablet (5 mg total) by mouth every 4 (four) hours as needed for severe pain. 11/14/22   Elson Areas, PA-C  potassium chloride SA (KLOR-CON M) 20 MEQ tablet Take 1 tablet (20 mEq total) by mouth 2 (two) times daily. 12/29/22   Glyn Ade, MD  rosuvastatin (CRESTOR) 10 MG tablet Take 10 mg by mouth at bedtime.    [provider]      Allergies    Avocado, Other, and Benadryl [diphenhydramine]    Review of Systems   Review of Systems  Respiratory:  Positive for cough and shortness of breath.     Physical Exam Updated Vital  Signs BP 109/66 (BP Location: Left Arm)   Pulse (!) 118   Temp 98.5 F (36.9 C) (Oral)   Resp 18   Ht 5\' 4"  (1.626 m)   Wt 69.9 kg   LMP  (LMP Unknown)   SpO2 98%   BMI 26.43 kg/m  Physical Exam  ED Results / Procedures / Treatments   Labs (all labs ordered are listed, but only abnormal results are displayed) Labs Reviewed  CBC WITH DIFFERENTIAL/PLATELET - Abnormal; Notable for the following components:      Result Value   WBC 11.2 (*)    Neutro Abs 10.7 (*)    Lymphs Abs 0.5 (*)    Monocytes Absolute 0.0 (*)    All other components within normal limits  COMPREHENSIVE METABOLIC PANEL - Abnormal; Notable for the following components:   Glucose, Bld 159 (*)    Total Bilirubin 0.2 (*)     All other components within normal limits  RESP PANEL BY RT-PCR (RSV, FLU A&B, COVID)  RVPGX2  LACTIC ACID, PLASMA  LACTIC ACID, PLASMA    EKG None  Radiology DG Chest 2 View  Result Date: 12/31/2022 CLINICAL DATA:  Cough. EXAM: CHEST - 2 VIEW COMPARISON:  December 29, 2022. FINDINGS: The heart size and mediastinal contours are within normal limits. Both lungs are clear. The visualized skeletal structures are unremarkable. IMPRESSION: No active cardiopulmonary disease. Electronically Signed   By: Lupita Raider M.D.   On: 12/31/2022 18:31    Procedures Procedures  {Document cardiac monitor, telemetry assessment procedure when appropriate:1}  Medications Ordered in ED Medications - No data to display  ED Course/ Medical Decision Making/ A&P   {   Click here for ABCD2, HEART and other calculatorsREFRESH Note before signing :1}                              Medical Decision Making Amount and/or Complexity of Data Reviewed Labs: ordered. Radiology: ordered.   ***  {Document critical care time when appropriate:1} {Document review of labs and clinical decision tools ie heart score, Chads2Vasc2 etc:1}  {Document your independent review of radiology images, and any outside records:1} {Document your discussion with family members, caretakers, and with consultants:1} {Document social determinants of health affecting pt's care:1} {Document your decision making why or why not admission, treatments were needed:1} Final Clinical Impression(s) / ED Diagnoses Final diagnoses:  None    Rx / DC Orders ED Discharge Orders     None

## 2022-12-31 NOTE — ED Triage Notes (Signed)
PT states she was seen here , then urgent care. She has been prescribed and is taking Azithromycin, klor con, Augmentin, and prednisone.Pt was also ordered a cough syrup with codeine but no pharmacy around her carries it. Pt states she isn't feeling any better. Pt was evaluated by respiratory in lobby. Pt states her sister tested positive for covid and she is concerned she was tested to early.

## 2023-01-01 ENCOUNTER — Other Ambulatory Visit: Payer: Self-pay

## 2023-01-01 DIAGNOSIS — R059 Cough, unspecified: Secondary | ICD-10-CM | POA: Diagnosis not present

## 2023-01-01 LAB — TROPONIN I (HIGH SENSITIVITY): Troponin I (High Sensitivity): <2 ng/L (ref <18)

## 2023-01-01 MED ORDER — HYDROCOD POLI-CHLORPHE POLI ER 10-8 MG/5ML PO SUER: 5.0000 mL | Freq: Two times a day (BID) | ORAL | 0 refills | Status: DC | PRN

## 2023-01-01 NOTE — Discharge Instructions (Addendum)
You were seen in the ER today for evaluation of a cough and cold symptoms.  You tested negative for COVID, flu, RSV.  Please follow-up with your MyChart results for your viral testing.  Please continue taking your medications with your antibiotics and prednisone as prescribed.  Please make sure you are using your albuterol inhaler as needed as well to help with your coughing and with your shortness of breath.  I'm glad that you are feeling better.  I have sent in a different cough syrup that you were on to your pharmacy.  Please do not drive or operate heavy machinery while on this medication as it can make you sleepy.  Please make sure to follow-up with your primary care doctor in the next few days for reevaluation.  If you have any concerns, new or worsening symptoms, please return to your nearest emergency department for evaluation.  Contact a doctor if: Your symptoms do not get better in 2 weeks. You have trouble coughing up the mucus. Your cough keeps you awake at night. You have a fever. Get help right away if: You cough up blood. You have chest pain. You have very bad shortness of breath. You faint or keep feeling like you are going to faint. You have a very bad headache. Your fever or chills get worse. These symptoms may be an emergency. Get help right away. Call your local emergency services (911 in the [place] Do not wait to see if the symptoms will go away. Do not drive yourself to the hospital.

## 2023-01-01 NOTE — ED Notes (Signed)
Ambulated patient via pulse ox. Patient resting saturation 98%/ HR 103. Patient able to speak in complete sentences without difficulty. Patient HR 117 / O2 saturation 96%. Patient returned to room place on monitor HR 104/ O2 sat 97%. Patient tolerated well.

## 2023-01-26 ENCOUNTER — Emergency Department (HOSPITAL_BASED_OUTPATIENT_CLINIC_OR_DEPARTMENT_OTHER)
Admission: EM | Admit: 2023-01-26 | Discharge: 2023-01-26 | Disposition: A | Discharge status: Home / Self Care | Payer: 59 | Attending: Emergency Medicine | Admitting: Emergency Medicine

## 2023-01-26 ENCOUNTER — Emergency Department (HOSPITAL_COMMUNITY): Payer: 59

## 2023-01-26 ENCOUNTER — Emergency Department (HOSPITAL_BASED_OUTPATIENT_CLINIC_OR_DEPARTMENT_OTHER): Payer: 59

## 2023-01-26 ENCOUNTER — Other Ambulatory Visit: Payer: Self-pay

## 2023-01-26 ENCOUNTER — Encounter (HOSPITAL_BASED_OUTPATIENT_CLINIC_OR_DEPARTMENT_OTHER): Payer: Self-pay

## 2023-01-26 DIAGNOSIS — M25569 Pain in unspecified knee: Secondary | ICD-10-CM | POA: Diagnosis present

## 2023-01-26 DIAGNOSIS — Z7982 Long term (current) use of aspirin: Secondary | ICD-10-CM | POA: Diagnosis not present

## 2023-01-26 DIAGNOSIS — W01198A Fall on same level from slipping, tripping and stumbling with subsequent striking against other object, initial encounter: Secondary | ICD-10-CM | POA: Insufficient documentation

## 2023-01-26 DIAGNOSIS — M25561 Pain in right knee: Secondary | ICD-10-CM | POA: Insufficient documentation

## 2023-01-26 DIAGNOSIS — M25562 Pain in left knee: Secondary | ICD-10-CM | POA: Diagnosis not present

## 2023-01-26 DIAGNOSIS — Y93E5 Activity, floor mopping and cleaning: Secondary | ICD-10-CM | POA: Diagnosis not present

## 2023-01-26 DIAGNOSIS — W19XXXA Unspecified fall, initial encounter: Secondary | ICD-10-CM

## 2023-01-26 MED ORDER — ACETAMINOPHEN 500 MG PO TABS
1000.0000 mg | ORAL_TABLET | Freq: Once | ORAL | Status: AC
  Administered 2023-01-26: 1000 mg via ORAL
  Filled 2023-01-26: qty 2

## 2023-01-26 MED ORDER — OXYCODONE HCL 5 MG PO TABS
5.0000 mg | ORAL_TABLET | Freq: Once | ORAL | Status: AC
  Administered 2023-01-26: 5 mg via ORAL
  Filled 2023-01-26: qty 1

## 2023-01-26 MED ORDER — IBUPROFEN 800 MG PO TABS
800.0000 mg | ORAL_TABLET | Freq: Once | ORAL | Status: AC
  Administered 2023-01-26: 800 mg via ORAL
  Filled 2023-01-26: qty 1

## 2023-01-26 NOTE — ED Notes (Signed)
Patient transported to X-ray 

## 2023-01-26 NOTE — ED Notes (Signed)
ED Provider at bedside. 

## 2023-01-26 NOTE — ED Provider Notes (Signed)
[place] [facility] Provider Note   CSN: [id number] Arrival date & time: 01/26/23  1321     History  Chief Complaint  Patient presents with   Diane Stein    Diane Stein is a 37 y.o. female.   Fall  Healthy 37 year old presenting for fall.  Patient was washing her car this morning.  Diane Stein slipped and fell.  Diane Stein hit the back of her head.  Did not lose consciousness.  Not on anticoagulation.  Diane Stein has pain to the back of her head, neck.  Diane Stein also has pain to her knees where Diane Stein fell.  Diane Stein endorses some mild chest wall pain as well.  No back pain, weakness, numbness.     Home Medications Prior to Admission medications   Medication Sig Start Date End Date Taking? Authorizing Provider  alprazolam Prudy Feeler) 2 MG tablet Take 2 mg by mouth 3 (three) times daily as needed for anxiety.    [provider]  aspirin EC 81 MG tablet Take 1 tablet (81 mg total) by mouth 2 (two) times daily. For DVT prophylaxis for 30 days after surgery. 08/08/20   Jenne Pane, PA-C  azithromycin (ZITHROMAX) 250 MG tablet Take 2 tablets today, then 1 tablet daily for the next 4 days. 12/29/22   Glyn Ade, MD  chlorpheniramine-HYDROcodone (TUSSIONEX) 10-8 MG/5ML Take 5 mLs by mouth every 12 (twelve) hours as needed for cough. 01/01/23   Achille Rich, PA-C  doxepin (SINEQUAN) 50 MG capsule Take 50-100 mg by mouth at bedtime as needed (sleep).     [provider]  EPINEPHrine 0.3 mg/0.3 mL IJ SOAJ injection Inject 0.3 mLs (0.3 mg total) into the muscle once as needed for up to 1 dose (anaphalaxis). 01/09/18   Loren Racer, MD  FLUoxetine (PROZAC) 20 MG capsule Take 60 mg by mouth daily.    [provider]  hydrOXYzine (ATARAX/VISTARIL) 10 MG tablet Take 10 mg by mouth See admin instructions. Take 10 mg daily, may take a second 10 mg dose as needed for anxiety    [provider]  levocetirizine (XYZAL) 5 MG tablet Take 5 mg by mouth every  evening.    [provider]  melatonin 3 MG TABS tablet Take 3 mg by mouth at bedtime as needed (sleep).    [provider]  methocarbamol (ROBAXIN) 500 MG tablet Take 1 tablet (500 mg total) by mouth every 8 (eight) hours as needed for muscle spasms. 08/08/20   Jenne Pane, PA-C  omeprazole (PRILOSEC OTC) 20 MG tablet Take 1 tablet (20 mg total) by mouth daily. For gastric protection while taking pain medicines. 08/08/20 09/07/20  Jenne Pane, PA-C  ondansetron (ZOFRAN) 4 MG tablet Take 1 tablet (4 mg total) by mouth daily as needed for nausea or vomiting. 08/08/20   Jenne Pane, PA-C  potassium chloride SA (KLOR-CON M) 20 MEQ tablet Take 1 tablet (20 mEq total) by mouth 2 (two) times daily. 12/29/22   Glyn Ade, MD  rosuvastatin (CRESTOR) 10 MG tablet Take 10 mg by mouth at bedtime.    [provider]      Allergies    Avocado, Other, and Benadryl [diphenhydramine]    Review of Systems   Review of Systems Review of systems completed and notable as per HPI.  ROS otherwise negative.   Physical Exam Updated Vital Signs BP 114/79 (BP Location: Left Arm)   Pulse (!) 108   Temp 99.6 F (37.6 C)  Resp 18   Ht 5\' 4"  (1.626 m)   Wt 69.9 kg   LMP  (LMP Unknown)   SpO2 100%   BMI 26.43 kg/m  Physical Exam Vitals and nursing note reviewed.  Constitutional:      General: Diane Stein is not in acute distress.    Appearance: Diane Stein is well-developed.  HENT:     Head: Normocephalic and atraumatic.     Nose: Nose normal.     Mouth/Throat:     Mouth: Mucous membranes are moist.     Pharynx: Oropharynx is clear.  Eyes:     Extraocular Movements: Extraocular movements intact.     Conjunctiva/sclera: Conjunctivae normal.     Pupils: Pupils are equal, round, and reactive to light.  Cardiovascular:     Rate and Rhythm: Normal rate and regular rhythm.     Pulses: Normal pulses.     Heart sounds: Normal heart sounds. No murmur heard. Pulmonary:     Effort:  Pulmonary effort is normal. No respiratory distress.     Breath sounds: Normal breath sounds.  Abdominal:     Palpations: Abdomen is soft.     Tenderness: There is no abdominal tenderness. There is no guarding or rebound.  Musculoskeletal:        General: No swelling.     Cervical back: Normal range of motion and neck supple. No rigidity or tenderness.     Right lower leg: No edema.     Left lower leg: No edema.     Comments: Mild tenderness to the knees.  No external trauma, joint laxity.  No wound.  Skin:    General: Skin is warm and dry.     Capillary Refill: Capillary refill takes less than 2 seconds.  Neurological:     General: No focal deficit present.     Mental Status: Diane Stein is alert and oriented to person, place, and time. Mental status is at baseline.     Cranial Nerves: No cranial nerve deficit.     Sensory: No sensory deficit.     Motor: No weakness.  Psychiatric:        Mood and Affect: Mood normal.     ED Results / Procedures / Treatments   Labs (all labs ordered are listed, but only abnormal results are displayed) Labs Reviewed - No data to display  EKG None  Radiology CT Cervical Spine Wo Contrast  Result Date: 01/26/2023 CLINICAL DATA:  Neck trauma, impaired ROM (Age 10-64y) Slip and fall today striking head. Bilateral knee pain. Chest, right ankle, right wrist, head and upper back pain. EXAM: CT CERVICAL SPINE WITHOUT CONTRAST TECHNIQUE: Multidetector CT imaging of the cervical spine was performed without intravenous contrast. Multiplanar CT image reconstructions were also generated. RADIATION DOSE REDUCTION: This exam was performed according to the departmental dose-optimization program which includes automated exposure control, adjustment of the mA and/or kV according to patient size and/or use of iterative reconstruction technique. COMPARISON:  Cervical spine CT 04/26/2019 FINDINGS: Alignment: Normal. Skull base and vertebrae: No acute fracture. Vertebral body  heights are maintained. The dens and skull base are intact. Soft tissues and spinal canal: No prevertebral fluid or swelling. No visible canal hematoma. Disc levels:  Preserved. Upper chest: No acute or unexpected findings. Other: None. IMPRESSION: Negative CT of the cervical spine. Electronically Signed   By: Narda Rutherford M.D.   On: 01/26/2023 16:29   CT Head Wo Contrast  Result Date: 01/26/2023 CLINICAL DATA:  Slip and fall today striking head.  Bilateral knee pain. Chest, right ankle, right wrist, head and upper back pain. EXAM: CT HEAD WITHOUT CONTRAST TECHNIQUE: Contiguous axial images were obtained from the base of the skull through the vertex without intravenous contrast. RADIATION DOSE REDUCTION: This exam was performed according to the departmental dose-optimization program which includes automated exposure control, adjustment of the mA and/or kV according to patient size and/or use of iterative reconstruction technique. COMPARISON:  Head CT 04/26/2019 FINDINGS: Brain: No intracranial hemorrhage, mass effect, or midline shift. No hydrocephalus. The basilar cisterns are patent. No evidence of territorial infarct or acute ischemia. No extra-axial or intracranial fluid collection. Vascular: No hyperdense vessel or unexpected calcification. Skull: No fracture or focal lesion. Sinuses/Orbits: Paranasal sinuses and mastoid air cells are clear. The visualized orbits are unremarkable. Other: No confluent scalp hematoma. IMPRESSION: Negative noncontrast head CT. Electronically Signed   By: Narda Rutherford M.D.   On: 01/26/2023 16:25   DG Knee Complete 4 Views Left  Result Date: 01/26/2023 CLINICAL DATA:  Slip and fall today striking head. Bilateral knee pain. Chest, right ankle, right wrist, head and upper back pain. EXAM: LEFT KNEE - COMPLETE 4+ VIEW COMPARISON:  12/18/2022 FINDINGS: Left knee arthroplasty in expected alignment. No periprosthetic lucency. No acute or periprosthetic fracture. No knee joint  effusion. No erosive change. Unremarkable soft tissues. IMPRESSION: Left knee arthroplasty without complication or acute fracture. Electronically Signed   By: Narda Rutherford M.D.   On: 01/26/2023 16:23   DG Knee Complete 4 Views Right  Result Date: 01/26/2023 CLINICAL DATA:  Slip and fall today striking head. Bilateral knee pain. Chest, right ankle, right wrist, head and upper back pain. EXAM: RIGHT KNEE - COMPLETE 4+ VIEW COMPARISON:  Knee radiograph 12/18/2022, additional priors reviewed. FINDINGS: No acute fracture or dislocation. 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 No evidence of subchondral collapse. No joint effusion. No focal soft tissue abnormalities. IMPRESSION: No acute fracture or dislocation of the right knee. Electronically Signed   By: Narda Rutherford M.D.   On: 01/26/2023 16:22   DG Chest 2 View  Result Date: 01/26/2023 CLINICAL DATA:  Slip and fall today striking head. Bilateral knee pain. Chest, right ankle, right wrist, head and upper back pain. EXAM: CHEST - 2 VIEW COMPARISON:  Chest radiograph 12/31/2022 FINDINGS: The cardiomediastinal contours are normal. The lungs are clear. Pulmonary vasculature is normal. No consolidation, pleural effusion, or pneumothorax. No acute osseous abnormalities are seen. IMPRESSION: Negative radiographs of the chest. Electronically Signed   By: Narda Rutherford M.D.   On: 01/26/2023 16:21    Procedures Procedures    Medications Ordered in ED Medications  acetaminophen (TYLENOL) tablet 1,000 mg (1,000 mg Oral Given 01/26/23 1605)  ibuprofen (ADVIL) tablet 800 mg (800 mg Oral Given 01/26/23 1658)  oxyCODONE (Oxy IR/ROXICODONE) immediate release tablet 5 mg (5 mg Oral Given 01/26/23 1658)    ED Course/ Medical Decision Making/ A&P                                 Medical Decision Making Amount and/or Complexity of Data Reviewed Radiology: ordered.  Risk OTC drugs. Prescription drug  management.   Medical Decision Making:   BRIAJA KINGSBURY is a 37 y.o. female who presented to the ED today with mechanical fall.  On exam Diane Stein reports mild headache, neck pain as well as pain to her knees.  Diane Stein also has some chest wall  pain.  No external signs of trauma.  Diane Stein is neurovascularly intact.  Diane Stein did have a significant fall from a height, will obtain CT scans and plain film and evaluate.   Patient placed on continuous vitals and telemetry monitoring while in ED which was reviewed periodically.  Reviewed and confirmed nursing documentation for past medical history, family history, social history.  Reassessment and Plan:   Imaging reviewed, CT head and cervical spine without acute abnormality.  Her knee x-rays are reassuring as is her chest x-ray.  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  I recommend Diane Stein follow-up closely with her PCP.  Diane Stein was discharged in stable condition with return precautions.   Patient's presentation is most consistent with acute complicated illness / injury requiring diagnostic workup.           Final Clinical Impression(s) / ED Diagnoses Final diagnoses:  Fall, initial encounter    Rx / DC Orders ED Discharge Orders     None         Laurence Spates, MD 01/26/23 2318

## 2023-01-26 NOTE — Discharge Instructions (Signed)
I recommend you follow-up closely with your primary care provider.  If you develop severe pain or any other new concerning symptoms you should return to the ED.

## 2023-01-26 NOTE — ED Triage Notes (Signed)
In for eval of slip and fall while washing her car today at approx 1130. Hit head, negative LOC. Pain to right ankle, bilateral knees, right wrist, head, upper back, and chest. Reports dizziness after incident but has resolved. Denies blurred vision.

## 2023-02-24 ENCOUNTER — Other Ambulatory Visit (HOSPITAL_BASED_OUTPATIENT_CLINIC_OR_DEPARTMENT_OTHER): Payer: Self-pay

## 2023-02-24 ENCOUNTER — Emergency Department (HOSPITAL_BASED_OUTPATIENT_CLINIC_OR_DEPARTMENT_OTHER)
Admission: EM | Admit: 2023-02-24 | Discharge: 2023-02-24 | Disposition: A | Discharge status: Home / Self Care | Payer: 59 | Attending: Emergency Medicine | Admitting: Emergency Medicine

## 2023-02-24 ENCOUNTER — Other Ambulatory Visit: Payer: Self-pay

## 2023-02-24 ENCOUNTER — Encounter (HOSPITAL_BASED_OUTPATIENT_CLINIC_OR_DEPARTMENT_OTHER): Payer: Self-pay | Admitting: Emergency Medicine

## 2023-02-24 DIAGNOSIS — Z7982 Long term (current) use of aspirin: Secondary | ICD-10-CM | POA: Diagnosis not present

## 2023-02-24 DIAGNOSIS — W268XXA Contact with other sharp object(s), not elsewhere classified, initial encounter: Secondary | ICD-10-CM | POA: Diagnosis not present

## 2023-02-24 DIAGNOSIS — S51812A Laceration without foreign body of left forearm, initial encounter: Secondary | ICD-10-CM | POA: Diagnosis present

## 2023-02-24 MED ORDER — CYCLOBENZAPRINE HCL 10 MG PO TABS
10.0000 mg | ORAL_TABLET | Freq: Two times a day (BID) | ORAL | 0 refills | Status: DC | PRN
  Filled 2023-02-24: qty 10, 5d supply, fill #0

## 2023-02-24 MED ORDER — LIDOCAINE-EPINEPHRINE (PF) 2 %-1:200000 IJ SOLN
20.0000 mL | Freq: Once | INTRAMUSCULAR | Status: AC
  Administered 2023-02-24: 20 mL

## 2023-02-24 MED ORDER — HYDROCODONE-ACETAMINOPHEN 5-325 MG PO TABS
1.0000 | ORAL_TABLET | Freq: Once | ORAL | Status: AC
  Administered 2023-02-24: 1 via ORAL
  Filled 2023-02-24: qty 1

## 2023-02-24 MED ORDER — OXYCODONE-ACETAMINOPHEN 5-325 MG PO TABS
1.0000 | ORAL_TABLET | Freq: Once | ORAL | Status: AC
  Administered 2023-02-24: 1 via ORAL
  Filled 2023-02-24: qty 1

## 2023-02-24 MED ORDER — LIDOCAINE-EPINEPHRINE (PF) 2 %-1:200000 IJ SOLN
INTRAMUSCULAR | Status: AC
  Filled 2023-02-24: qty 20

## 2023-02-24 NOTE — Discharge Instructions (Addendum)
You have been seen today for your complaint of forearm laceration. Your discharge medications include flexeril. This is a muscle relaxer. It may cause drowsiness. Do not drive, operate heavy machinery or make important decisions when taking this medication. Only take it at night until you know how it affects you. Only take it as needed and take other medications such as ibuprofen or tylenol prior to trying this medication. Home care instructions are as follows:  Keep the area clean and dry for 24 hours.  Clean with warm soapy water once daily afterwards Follow up with: Any urgent care, primary care provider or emergency department in 7 to 10 days for suture removal Please seek immediate medical care if you develop any of the following symptoms: You develop severe swelling around the wound. Your pain suddenly increases and is severe. You develop painful lumps near the wound or on skin anywhere else on your body. You have a red streak going away from your wound. The wound is on your hand or foot, and you cannot properly move a finger or toe. The wound is on your hand or foot, and you notice that your fingers or toes look pale or bluish. At this time there does not appear to be the presence of an emergent medical condition, however there is always the potential for conditions to change. Please read and follow the below instructions.  Do not take your medicine if  develop an itchy rash, swelling in your mouth or lips, or difficulty breathing; call 911 and seek immediate emergency medical attention if this occurs.  You may review your lab tests and imaging results in their entirety on your MyChart account.  Please discuss all results of fully with your primary care provider and other specialist at your follow-up visit.  Note: Portions of this text may have been transcribed using voice recognition software. Every effort was made to ensure accuracy; however, inadvertent computerized transcription errors  may still be present.

## 2023-02-24 NOTE — ED Triage Notes (Addendum)
Left forearm laceration , cut herself while cleaning her car , reports a piece of metal cut her . Tetanus up to date . Approximately 4 inch long laceration , small bleeding , new pressure dressing applied in triage

## 2023-02-24 NOTE — ED Provider Notes (Signed)
[place] [facility] Provider Note   CSN: [id number] Arrival date & time: 02/24/23  1456     History  Chief Complaint  Patient presents with   Extremity Laceration    left    Diane Stein is a 37 y.o. female.  Presenting to the ED for evaluation of a laceration to the left forearm.  She states she was cleaning her vehicle out when a piece of jagged metal cut her across the forearm.  This occurred just prior to arrival.  She states her tetanus vaccine is up-to-date.  She does not take any blood thinners.  She reports pain localized to the area of the laceration.  No difficulty moving her arms, fingers or wrists.  HPI     Home Medications Prior to Admission medications   Medication Sig Start Date End Date Taking? Authorizing Provider  cyclobenzaprine (FLEXERIL) 10 MG tablet Take 1 tablet (10 mg total) by mouth 2 (two) times daily as needed for muscle spasms. 02/24/23  Yes Sailor Hevia, Edsel Petrin, PA-C  alprazolam Prudy Feeler) 2 MG tablet Take 2 mg by mouth 3 (three) times daily as needed for anxiety.    [provider]  aspirin EC 81 MG tablet Take 1 tablet (81 mg total) by mouth 2 (two) times daily. For DVT prophylaxis for 30 days after surgery. 08/08/20   Jenne Pane, PA-C  azithromycin (ZITHROMAX) 250 MG tablet Take 2 tablets today, then 1 tablet daily for the next 4 days. 12/29/22   Glyn Ade, MD  chlorpheniramine-HYDROcodone (TUSSIONEX) 10-8 MG/5ML Take 5 mLs by mouth every 12 (twelve) hours as needed for cough. 01/01/23   Achille Rich, PA-C  doxepin (SINEQUAN) 50 MG capsule Take 50-100 mg by mouth at bedtime as needed (sleep).     [provider]  EPINEPHrine 0.3 mg/0.3 mL IJ SOAJ injection Inject 0.3 mLs (0.3 mg total) into the muscle once as needed for up to 1 dose (anaphalaxis). 01/09/18   Loren Racer, MD  FLUoxetine (PROZAC) 20 MG capsule Take 60 mg by mouth daily.    [provider]  hydrOXYzine  (ATARAX/VISTARIL) 10 MG tablet Take 10 mg by mouth See admin instructions. Take 10 mg daily, may take a second 10 mg dose as needed for anxiety    [provider]  levocetirizine (XYZAL) 5 MG tablet Take 5 mg by mouth every evening.    [provider]  melatonin 3 MG TABS tablet Take 3 mg by mouth at bedtime as needed (sleep).    [provider]  methocarbamol (ROBAXIN) 500 MG tablet Take 1 tablet (500 mg total) by mouth every 8 (eight) hours as needed for muscle spasms. 08/08/20   Jenne Pane, PA-C  omeprazole (PRILOSEC OTC) 20 MG tablet Take 1 tablet (20 mg total) by mouth daily. For gastric protection while taking pain medicines. 08/08/20 09/07/20  Jenne Pane, PA-C  ondansetron (ZOFRAN) 4 MG tablet Take 1 tablet (4 mg total) by mouth daily as needed for nausea or vomiting. 08/08/20   Jenne Pane, PA-C  potassium chloride SA (KLOR-CON M) 20 MEQ tablet Take 1 tablet (20 mEq total) by mouth 2 (two) times daily. 12/29/22   Glyn Ade, MD  rosuvastatin (CRESTOR) 10 MG tablet Take 10 mg by mouth at bedtime.    [provider]      Allergies    Avocado, Other, and Benadryl [diphenhydramine]    Review of Systems   Review of Systems  Skin:  Positive for wound.  All other systems reviewed and are negative.   Physical Exam Updated Vital Signs BP (!) 121/38 (BP Location: Right Arm)   Pulse (!) 109   Temp 98.7 F (37.1 C)   Resp 18   Wt 68 kg   LMP  (LMP Unknown)   SpO2 100%   BMI 25.75 kg/m  Physical Exam Vitals and nursing note reviewed.  Constitutional:      General: She is not in acute distress.    Appearance: Normal appearance. She is normal weight. She is not ill-appearing.  HENT:     Head: Normocephalic and atraumatic.  Pulmonary:     Effort: Pulmonary effort is normal. No respiratory distress.  Abdominal:     General: Abdomen is flat.  Musculoskeletal:        General: Normal range of motion.     Cervical back: Neck supple.      Comments: Grip strength 5 out of 5 upper extremities.  Sensation intact in all digits.  Capillary refill normal.  Radial pulse 2+.  Skin:    General: Skin is warm and dry.     Comments: 10 cm superficial laceration to the left forearm overlying the radius.  No active bleeding  Neurological:     Mental Status: She is alert and oriented to person, place, and time.  Psychiatric:        Mood and Affect: Mood normal.        Behavior: Behavior normal.     ED Results / Procedures / Treatments   Labs (all labs ordered are listed, but only abnormal results are displayed) Labs Reviewed - No data to display  EKG None  Radiology No results found.  Procedures .Marland KitchenLaceration Repair  Date/Time: 02/24/2023 5:20 PM  Performed by: Michelle Piper, PA-C Authorized by: Michelle Piper, PA-C   Consent:    Consent obtained:  Verbal   Consent given by:  Patient   Risks discussed:  Infection, pain, poor cosmetic result and poor wound healing   Alternatives discussed:  No treatment Universal protocol:    Procedure explained and questions answered to patient or proxy's satisfaction: yes     Relevant documents present and verified: yes     Immediately prior to procedure, a time out was called: yes     Patient identity confirmed:  Verbally with patient and arm band Anesthesia:    Anesthesia method:  Local infiltration   Local anesthetic:  Lidocaine 2% WITH epi Laceration details:    Location:  Shoulder/arm   Shoulder/arm location:  L lower arm   Length (cm):  10 Exploration:    Hemostasis achieved with:  Epinephrine   Wound exploration: wound explored through full range of motion and entire depth of wound visualized   Treatment:    Area cleansed with:  Povidone-iodine, Shur-Clens and saline   Amount of cleaning:  Standard   Irrigation solution:  Sterile saline   Irrigation volume:  500   Irrigation method:  Pressure wash Skin repair:    Repair method:  Sutures   Suture size:   4-0   Suture material:  Nylon   Suture technique:  Simple interrupted   Number of sutures:  9 Approximation:    Approximation:  Close Repair type:    Repair type:  Simple Post-procedure details:    Dressing:  Non-adherent dressing   Procedure completion:  Tolerated well, no immediate complications     Medications Ordered in ED Medications  HYDROcodone-acetaminophen (NORCO/VICODIN) 5-325 MG per  tablet 1 tablet (has no administration in time range)  oxyCODONE-acetaminophen (PERCOCET/ROXICET) 5-325 MG per tablet 1 tablet (1 tablet Oral Given 02/24/23 1606)  lidocaine-EPINEPHrine (XYLOCAINE W/EPI) 2 %-1:200000 (PF) injection 20 mL (20 mLs Infiltration Given by Other 02/24/23 1608)    ED Course/ Medical Decision Making/ A&P                                 Medical Decision Making Risk Prescription drug management.   This patient presents to the ED for concern of laceration, this involves an extensive number of treatment options, and is a complaint that carries with it a high risk of complications and morbidity.  The differential diagnosis includes superficial laceration, tendon or ligament damage  My initial workup includes wound cleansing and repair  Additional history obtained from: Nursing notes from this visit.  Afebrile, hemodynamically stable.  37 year old female presents to the ED for evaluation of left forearm laceration that occurred just prior to arrival.  Tetanus vaccine is up-to-date.  Neurovascular status is intact.  He was thoroughly cleansed and closed in the emergency department.  Patient tolerated well.  She was requesting an opioid pain medication prescription.  Discussed the appropriateness of this, instead prescribed a short course of Flexeril for the pain.  She was encouraged to take Tylenol and ibuprofen in addition to this.  She was encouraged to return in 7 to 10 days for suture removal.  She was given return precautions including signs and symptoms of infection.   Stable at discharge.  At this time there does not appear to be any evidence of an acute emergency medical condition and the patient appears stable for discharge with appropriate outpatient follow up. Diagnosis was discussed with patient who verbalizes understanding of care plan and is agreeable to discharge. I have discussed return precautions with patient who verbalizes understanding. Patient encouraged to follow-up with their PCP within 1 week. All questions answered.  Note: Portions of this report may have been transcribed using voice recognition software. Every effort was made to ensure accuracy; however, inadvertent computerized transcription errors may still be present.        Final Clinical Impression(s) / ED Diagnoses Final diagnoses:  Laceration of left forearm, initial encounter    Rx / DC Orders ED Discharge Orders          Ordered    cyclobenzaprine (FLEXERIL) 10 MG tablet  2 times daily PRN        02/24/23 1704              Michelle Piper, PA-C 02/24/23 1722    Alvira Monday, MD 02/25/23 1312

## 2023-02-24 NOTE — ED Notes (Signed)

## 2023-03-29 ENCOUNTER — Other Ambulatory Visit: Payer: Self-pay

## 2023-03-29 ENCOUNTER — Encounter (HOSPITAL_BASED_OUTPATIENT_CLINIC_OR_DEPARTMENT_OTHER): Payer: Self-pay | Admitting: Emergency Medicine

## 2023-03-29 ENCOUNTER — Emergency Department (HOSPITAL_BASED_OUTPATIENT_CLINIC_OR_DEPARTMENT_OTHER): Payer: 59

## 2023-03-29 ENCOUNTER — Emergency Department (HOSPITAL_BASED_OUTPATIENT_CLINIC_OR_DEPARTMENT_OTHER)
Admission: EM | Admit: 2023-03-29 | Discharge: 2023-03-29 | Disposition: A | Discharge status: Home / Self Care | Payer: 59 | Attending: Emergency Medicine | Admitting: Emergency Medicine

## 2023-03-29 DIAGNOSIS — J029 Acute pharyngitis, unspecified: Secondary | ICD-10-CM | POA: Diagnosis not present

## 2023-03-29 DIAGNOSIS — M545 Low back pain, unspecified: Secondary | ICD-10-CM | POA: Diagnosis not present

## 2023-03-29 DIAGNOSIS — J45909 Unspecified asthma, uncomplicated: Secondary | ICD-10-CM | POA: Insufficient documentation

## 2023-03-29 DIAGNOSIS — Z20822 Contact with and (suspected) exposure to covid-19: Secondary | ICD-10-CM | POA: Diagnosis not present

## 2023-03-29 DIAGNOSIS — Z7982 Long term (current) use of aspirin: Secondary | ICD-10-CM | POA: Insufficient documentation

## 2023-03-29 DIAGNOSIS — R059 Cough, unspecified: Secondary | ICD-10-CM | POA: Diagnosis present

## 2023-03-29 DIAGNOSIS — R051 Acute cough: Secondary | ICD-10-CM | POA: Insufficient documentation

## 2023-03-29 DIAGNOSIS — R300 Dysuria: Secondary | ICD-10-CM | POA: Diagnosis not present

## 2023-03-29 LAB — URINALYSIS, ROUTINE W REFLEX MICROSCOPIC
Bilirubin Urine: NEGATIVE
Glucose, UA: NEGATIVE mg/dL
Hgb urine dipstick: NEGATIVE
Ketones, ur: NEGATIVE mg/dL
Leukocytes,Ua: NEGATIVE
Nitrite: NEGATIVE
Protein, ur: NEGATIVE mg/dL
Specific Gravity, Urine: 1.02 (ref 1.005–1.030)
pH: 7.5 (ref 5.0–8.0)

## 2023-03-29 LAB — RESP PANEL BY RT-PCR (RSV, FLU A&B, COVID)  RVPGX2
Influenza A by PCR: NEGATIVE
Influenza B by PCR: NEGATIVE
Resp Syncytial Virus by PCR: NEGATIVE
SARS Coronavirus 2 by RT PCR: NEGATIVE

## 2023-03-29 LAB — GROUP A STREP BY PCR: Group A Strep by PCR: NOT DETECTED

## 2023-03-29 MED ORDER — HYDROCODONE-ACETAMINOPHEN 5-325 MG PO TABS
1.0000 | ORAL_TABLET | Freq: Once | ORAL | Status: AC
  Administered 2023-03-29: 1 via ORAL
  Filled 2023-03-29: qty 1

## 2023-03-29 MED ORDER — BENZONATATE 100 MG PO CAPS
100.0000 mg | ORAL_CAPSULE | Freq: Three times a day (TID) | ORAL | 0 refills | Status: DC
Start: 2023-03-29 — End: 2024-03-04

## 2023-03-29 MED ORDER — MORPHINE SULFATE (PF) 4 MG/ML IV SOLN
4.0000 mg | Freq: Once | INTRAVENOUS | Status: AC
  Administered 2023-03-29: 4 mg via INTRAVENOUS
  Filled 2023-03-29: qty 1

## 2023-03-29 MED ORDER — HYDROCODONE-ACETAMINOPHEN 5-325 MG PO TABS
1.0000 | ORAL_TABLET | Freq: Four times a day (QID) | ORAL | 0 refills | Status: DC | PRN
Start: 2023-03-29 — End: 2024-01-08

## 2023-03-29 NOTE — ED Notes (Signed)
ED Provider at bedside. 

## 2023-03-29 NOTE — ED Triage Notes (Signed)
Sore throat, nasal congestion, pain with urination in her back, productive cough, x 3 days.  No known fever.

## 2023-03-29 NOTE — Discharge Instructions (Addendum)
You have been seen today for your complaint of cough, cold symptoms, back pain. Your lab work was reassuring. Your imaging was reassuring. Your discharge medications include Norco. This is an opioid pain medication. You should only take this medication as needed for severe pain. You should not drive, operate heavy machinery or make important decisions while taking this medication. You should use alternative methods for pain relief while taking this medication including stretching, gentle range of motion, and alternating tylenol and ibuprofen. Alternate tylenol and ibuprofen for pain. You may alternate these every 4 hours. You may take up to 800 mg of ibuprofen at a time and up to 1000 mg of tylenol. Follow up with: your PCP in one week for reevaluation Please seek immediate medical care if you develop any of the following symptoms: You cough up blood. You have trouble breathing. Your heart is beating very fast. At this time there does not appear to be the presence of an emergent medical condition, however there is always the potential for conditions to change. Please read and follow the below instructions.  Do not take your medicine if  develop an itchy rash, swelling in your mouth or lips, or difficulty breathing; call 911 and seek immediate emergency medical attention if this occurs.  You may review your lab tests and imaging results in their entirety on your MyChart account.  Please discuss all results of fully with your primary care provider and other specialist at your follow-up visit.  Note: Portions of this text may have been transcribed using voice recognition software. Every effort was made to ensure accuracy; however, inadvertent computerized transcription errors may still be present.

## 2023-03-29 NOTE — ED Provider Notes (Signed)
[place] [facility] Provider Note   CSN: [id number] Arrival date & time: 03/29/23  1206     History  Chief Complaint  Patient presents with   Sore Throat   Dysuria    Diane Stein is a 37 y.o. female.  With a history of asthma, anxiety, depression presenting to the ED for evaluation of upper respiratory symptoms and bilateral back pain.  Symptoms began approximately 3 days ago.  She states she has a cough productive of green to yellow sputum.  Cough is very frequent.  She is also concerned for bilateral kidney stones versus urinary tract infection.  She states she has constant back pain described as an intense ache.  It is worse with coughing and bearing down such as avoiding her bladder.  She denies any fevers.  She denies known sick contacts but states she has an 50-year-old daughter at home that may have brought an illness home from school.  She denies any dysuria, frequency, urgency, hematuria.  She denies any shortness of breath.   Sore Throat  Dysuria Associated symptoms: flank pain        Home Medications Prior to Admission medications   Medication Sig Start Date End Date Taking? Authorizing Provider  benzonatate (TESSALON) 100 MG capsule Take 1 capsule (100 mg total) by mouth every 8 (eight) hours. 03/29/23  Yes Jaquia Benedicto, Edsel Petrin, PA-C  HYDROcodone-acetaminophen (NORCO/VICODIN) 5-325 MG tablet Take 1 tablet by mouth every 6 (six) hours as needed. 03/29/23  Yes Shakthi Scipio, Edsel Petrin, PA-C  alprazolam Prudy Feeler) 2 MG tablet Take 2 mg by mouth 3 (three) times daily as needed for anxiety.    [provider]  aspirin EC 81 MG tablet Take 1 tablet (81 mg total) by mouth 2 (two) times daily. For DVT prophylaxis for 30 days after surgery. 08/08/20   Jenne Pane, PA-C  azithromycin (ZITHROMAX) 250 MG tablet Take 2 tablets today, then 1 tablet daily for the next 4 days. 12/29/22   Glyn Ade, MD  chlorpheniramine-HYDROcodone  (TUSSIONEX) 10-8 MG/5ML Take 5 mLs by mouth every 12 (twelve) hours as needed for cough. 01/01/23   Achille Rich, PA-C  cyclobenzaprine (FLEXERIL) 10 MG tablet Take 1 tablet (10 mg total) by mouth 2 (two) times daily as needed for muscle spasms. 02/24/23   Rogene Meth, Edsel Petrin, PA-C  doxepin (SINEQUAN) 50 MG capsule Take 50-100 mg by mouth at bedtime as needed (sleep).     [provider]  EPINEPHrine 0.3 mg/0.3 mL IJ SOAJ injection Inject 0.3 mLs (0.3 mg total) into the muscle once as needed for up to 1 dose (anaphalaxis). 01/09/18   Loren Racer, MD  FLUoxetine (PROZAC) 20 MG capsule Take 60 mg by mouth daily.    [provider]  hydrOXYzine (ATARAX/VISTARIL) 10 MG tablet Take 10 mg by mouth See admin instructions. Take 10 mg daily, may take a second 10 mg dose as needed for anxiety    [provider]  levocetirizine (XYZAL) 5 MG tablet Take 5 mg by mouth every evening.    [provider]  melatonin 3 MG TABS tablet Take 3 mg by mouth at bedtime as needed (sleep).    [provider]  methocarbamol (ROBAXIN) 500 MG tablet Take 1 tablet (500 mg total) by mouth every 8 (eight) hours as needed for muscle spasms. 08/08/20   Jenne Pane, PA-C  omeprazole (PRILOSEC OTC) 20 MG tablet Take 1 tablet (20 mg total) by mouth daily. For gastric  protection while taking pain medicines. 08/08/20 09/07/20  Jenne Pane, PA-C  ondansetron (ZOFRAN) 4 MG tablet Take 1 tablet (4 mg total) by mouth daily as needed for nausea or vomiting. 08/08/20   Jenne Pane, PA-C  potassium chloride SA (KLOR-CON M) 20 MEQ tablet Take 1 tablet (20 mEq total) by mouth 2 (two) times daily. 12/29/22   Glyn Ade, MD  rosuvastatin (CRESTOR) 10 MG tablet Take 10 mg by mouth at bedtime.    [provider]      Allergies    Avocado, Other, and Benadryl [diphenhydramine]    Review of Systems   Review of Systems  Respiratory:  Positive for cough.   Genitourinary:  Positive  for flank pain.  All other systems reviewed and are negative.   Physical Exam Updated Vital Signs BP 114/80   Pulse 91   Temp 98.4 F (36.9 C) (Oral)   Resp 16   Ht 5\' 4"  (1.626 m)   Wt 68 kg   LMP  (LMP Unknown)   SpO2 100%   BMI 25.75 kg/m  Physical Exam Vitals and nursing note reviewed.  Constitutional:      General: She is not in acute distress.    Appearance: She is well-developed.     Comments: Resting comfortably in bed  HENT:     Head: Normocephalic and atraumatic.  Eyes:     Conjunctiva/sclera: Conjunctivae normal.  Cardiovascular:     Rate and Rhythm: Normal rate and regular rhythm.     Heart sounds: No murmur heard. Pulmonary:     Effort: Pulmonary effort is normal. No respiratory distress.     Breath sounds: Normal breath sounds. No wheezing, rhonchi or rales.  Abdominal:     Palpations: Abdomen is soft.     Tenderness: There is no abdominal tenderness.     Comments: Mild TTP to bilateral paraspinal muscles of the T and L-spine.  Musculoskeletal:        General: No swelling.     Cervical back: Neck supple.  Skin:    General: Skin is warm and dry.     Capillary Refill: Capillary refill takes less than 2 seconds.  Neurological:     Mental Status: She is alert.  Psychiatric:        Mood and Affect: Mood normal.     ED Results / Procedures / Treatments   Labs (all labs ordered are listed, but only abnormal results are displayed) Labs Reviewed  RESP PANEL BY RT-PCR (RSV, FLU A&B, COVID)  [id number]  GROUP A STREP BY PCR  URINALYSIS, ROUTINE W REFLEX MICROSCOPIC    EKG None  Radiology DG Chest Port 1 View  Result Date: 03/29/2023 CLINICAL DATA:  Productive cough for 3 days.  Sore throat. EXAM: PORTABLE CHEST 1 VIEW COMPARISON:  Radiographs 01/26/2023 and 12/31/2022.  CT 12/29/2022. FINDINGS: 1417 hours. The heart size and mediastinal contours are normal. The lungs are clear. There is no pleural effusion or pneumothorax. No acute osseous findings are  identified. IMPRESSION: No evidence of acute cardiopulmonary process. Electronically Signed   By: Carey Bullocks M.D.   On: 03/29/2023 15:10    Procedures Procedures    Medications Ordered in ED Medications  HYDROcodone-acetaminophen (NORCO/VICODIN) 5-325 MG per tablet 1 tablet (has no administration in time range)  morphine (PF) 4 MG/ML injection 4 mg (4 mg Intravenous Given 03/29/23 1416)    ED Course/ Medical Decision Making/ A&P  Medical Decision Making Amount and/or Complexity of Data Reviewed Labs: ordered. Radiology: ordered.  Risk Prescription drug management.  This patient presents to the ED for concern of bilateral flank pain, URI type symptoms, this involves an extensive number of treatment options, and is a complaint that carries with it a high risk of complications and morbidity.  The differential diagnosis includes flu, COVID, RSV, other viral URI  Differential diagnosis for her back pain includes pyelonephritis, nephrolithiasis/ureterolithiasis, MSK injury  My initial workup includes labs, imaging  Additional history obtained from: Nursing notes from this visit.  I ordered, reviewed and interpreted labs which include: Respiratory panel, strep, urinalysis.  Labs negative  I ordered imaging studies including chest x-ray I independently visualized and interpreted imaging which showed negative I agree with the radiologist interpretation  Afebrile, hemodynamically stable.  37 year old female presenting for evaluation of bilateral lower back pain, congestion, rhinorrhea, cough.  Symptoms have been present for 3 days.  Back pain is worse with cough and with bearing down.  She denies any urinary symptoms.  No hematuria.  No fevers or chills.  She is nontachypneic, nontachycardic, nonhypoxic, nontoxic-appearing.  She has mild tenderness to palpation of the musculature of the back.  Given lack of urinary symptoms and normal urinalysis, very  low suspicion for pyelonephritis.  Also very low suspicion for ureterolithiasis given lack of blood in urine and bilateral nature of the pain.  Overall suspect musculoskeletal pain secondary to coughing.  Will treat with short course of Norco and educated on potential side effects.  Also sent prescription for [facility]  She was encouraged to return to their primary care provider in 1 week for reevaluation of her symptoms.  She was given return precautions.  Stable at discharge.  At this time there does not appear to be any evidence of an acute emergency medical condition and the patient appears stable for discharge with appropriate outpatient follow up. Diagnosis was discussed with patient who verbalizes understanding of care plan and is agreeable to discharge. I have discussed return precautions with patient who verbalizes understanding. Patient encouraged to follow-up with their PCP within 1 week. All questions answered.  Note: Portions of this report may have been transcribed using voice recognition software. Every effort was made to ensure accuracy; however, inadvertent computerized transcription errors may still be present.        Final Clinical Impression(s) / ED Diagnoses Final diagnoses:  Acute cough  Acute bilateral low back pain without sciatica    Rx / DC Orders ED Discharge Orders          Ordered    HYDROcodone-acetaminophen (NORCO/VICODIN) 5-325 MG tablet  Every 6 hours PRN        03/29/23 1531    benzonatate (TESSALON) 100 MG capsule  Every 8 hours        03/29/23 1531              Michelle Piper, PA-C 03/29/23 1534    Terald Sleeper, MD 03/30/23 [phone]

## 2023-03-29 NOTE — ED Notes (Signed)
D/c paperwork reviewed with pt, including prescriptions and follow up care.  All questions and/or concerns addressed at time of d/c.  No further needs expressed. . Pt verbalized understanding, Ambulatory without assistance to ED exit, NAD.   

## 2023-04-20 ENCOUNTER — Other Ambulatory Visit: Payer: Self-pay

## 2023-04-20 ENCOUNTER — Emergency Department (HOSPITAL_BASED_OUTPATIENT_CLINIC_OR_DEPARTMENT_OTHER): Payer: 59

## 2023-04-20 ENCOUNTER — Emergency Department (HOSPITAL_BASED_OUTPATIENT_CLINIC_OR_DEPARTMENT_OTHER)
Admission: EM | Admit: 2023-04-20 | Discharge: 2023-04-20 | Disposition: A | Discharge status: Home / Self Care | Payer: 59 | Attending: Emergency Medicine | Admitting: Emergency Medicine

## 2023-04-20 DIAGNOSIS — R6884 Jaw pain: Secondary | ICD-10-CM

## 2023-04-20 DIAGNOSIS — M25551 Pain in right hip: Secondary | ICD-10-CM | POA: Insufficient documentation

## 2023-04-20 DIAGNOSIS — M25552 Pain in left hip: Secondary | ICD-10-CM | POA: Diagnosis not present

## 2023-04-20 DIAGNOSIS — M25561 Pain in right knee: Secondary | ICD-10-CM | POA: Diagnosis not present

## 2023-04-20 DIAGNOSIS — M25569 Pain in unspecified knee: Secondary | ICD-10-CM

## 2023-04-20 DIAGNOSIS — K029 Dental caries, unspecified: Secondary | ICD-10-CM | POA: Insufficient documentation

## 2023-04-20 DIAGNOSIS — M25559 Pain in unspecified hip: Secondary | ICD-10-CM

## 2023-04-20 MED ORDER — OXYCODONE HCL 20 MG/ML PO CONC: 5.0000 mg | ORAL | Status: DC | PRN

## 2023-04-20 MED ORDER — OXYCODONE HCL 5 MG PO TABS: 5.0000 mg | ORAL_TABLET | Freq: Four times a day (QID) | ORAL | 0 refills | Status: DC | PRN

## 2023-04-20 MED ORDER — OXYCODONE HCL 20 MG/ML PO CONC: 5.0000 mg | Freq: Once | ORAL | Status: DC

## 2023-04-20 MED ORDER — AMOXICILLIN 500 MG PO CAPS: 500.0000 mg | ORAL_CAPSULE | Freq: Two times a day (BID) | ORAL | 0 refills | Status: AC

## 2023-04-20 MED ORDER — MORPHINE SULFATE (PF) 2 MG/ML IV SOLN
2.0000 mg | Freq: Once | INTRAVENOUS | Status: AC
  Administered 2023-04-20: 2 mg via INTRAMUSCULAR
  Filled 2023-04-20: qty 1

## 2023-04-20 NOTE — ED Provider Notes (Signed)
[place] [facility] Provider Note   CSN: [id number] Arrival date & time: 04/20/23  1724     History  Chief Complaint  Patient presents with   Jaw Pain    Diane Stein is a 37 y.o. female.  Patient here with left-sided upper dental/jaw pain.  Here with acute on chronic pain to her hips and her right knee.  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  She is due for right knee replacement soon.  She had a left knee replaced in the past.  She denies any trauma.  She is having some breakthrough pain and over-the-counter medication not helping.  She denies any chest pain shortness of breath weakness numbness tingling.  The history is provided by the patient.       Home Medications Prior to Admission medications   Medication Sig Start Date End Date Taking? Authorizing Provider  amoxicillin (AMOXIL) 500 MG capsule Take 1 capsule (500 mg total) by mouth 2 (two) times daily for 7 days. 04/20/23 04/27/23 Yes Honor Fairbank, DO  oxyCODONE (ROXICODONE) 5 MG immediate release tablet Take 1 tablet (5 mg total) by mouth every 6 (six) hours as needed for up to 10 doses. 04/20/23  Yes Zakeria Kulzer, DO  alprazolam Prudy Feeler) 2 MG tablet Take 2 mg by mouth 3 (three) times daily as needed for anxiety.    [provider]  aspirin EC 81 MG tablet Take 1 tablet (81 mg total) by mouth 2 (two) times daily. For DVT prophylaxis for 30 days after surgery. 08/08/20   Jenne Pane, PA-C  azithromycin (ZITHROMAX) 250 MG tablet Take 2 tablets today, then 1 tablet daily for the next 4 days. 12/29/22   Glyn Ade, MD  benzonatate (TESSALON) 100 MG capsule Take 1 capsule (100 mg total) by mouth every 8 (eight) hours. 03/29/23   Schutt, Edsel Petrin, PA-C  chlorpheniramine-HYDROcodone (TUSSIONEX) 10-8 MG/5ML Take 5 mLs by mouth every 12 (twelve) hours as needed for cough. 01/01/23   Achille Rich, PA-C  cyclobenzaprine (FLEXERIL) 10 MG tablet Take  1 tablet (10 mg total) by mouth 2 (two) times daily as needed for muscle spasms. 02/24/23   Schutt, Edsel Petrin, PA-C  doxepin (SINEQUAN) 50 MG capsule Take 50-100 mg by mouth at bedtime as needed (sleep).     [provider]  EPINEPHrine 0.3 mg/0.3 mL IJ SOAJ injection Inject 0.3 mLs (0.3 mg total) into the muscle once as needed for up to 1 dose (anaphalaxis). 01/09/18   Loren Racer, MD  FLUoxetine (PROZAC) 20 MG capsule Take 60 mg by mouth daily.    [provider]  HYDROcodone-acetaminophen (NORCO/VICODIN) 5-325 MG tablet Take 1 tablet by mouth every 6 (six) hours as needed. 03/29/23   Schutt, Edsel Petrin, PA-C  hydrOXYzine (ATARAX/VISTARIL) 10 MG tablet Take 10 mg by mouth See admin instructions. Take 10 mg daily, may take a second 10 mg dose as needed for anxiety    [provider]  levocetirizine (XYZAL) 5 MG tablet Take 5 mg by mouth every evening.    [provider]  melatonin 3 MG TABS tablet Take 3 mg by mouth at bedtime as needed (sleep).    [provider]  methocarbamol (ROBAXIN) 500 MG tablet Take 1 tablet (500 mg total) by mouth every 8 (eight) hours as needed for muscle spasms. 08/08/20   Jenne Pane, PA-C  omeprazole (PRILOSEC OTC) 20 MG tablet Take 1 tablet (20 mg total)  by mouth daily. For gastric protection while taking pain medicines. 08/08/20 09/07/20  Jenne Pane, PA-C  ondansetron (ZOFRAN) 4 MG tablet Take 1 tablet (4 mg total) by mouth daily as needed for nausea or vomiting. 08/08/20   Jenne Pane, PA-C  potassium chloride SA (KLOR-CON M) 20 MEQ tablet Take 1 tablet (20 mEq total) by mouth 2 (two) times daily. 12/29/22   Glyn Ade, MD  rosuvastatin (CRESTOR) 10 MG tablet Take 10 mg by mouth at bedtime.    [provider]      Allergies    Avocado, Other, and Benadryl [diphenhydramine]    Review of Systems   Review of Systems  Physical Exam Updated Vital Signs BP 119/80 (BP Location: Left Arm)    Pulse (!) 105   Temp 98.6 F (37 C)   Resp 18   Ht 5\' 4"  (1.626 m)   Wt 68 kg   LMP  (LMP Unknown)   SpO2 100%   BMI 25.75 kg/m  Physical Exam Vitals and nursing note reviewed.  Constitutional:      General: She is not in acute distress.    Appearance: She is well-developed. She is not ill-appearing.  HENT:     Head: Normocephalic and atraumatic.     Comments: Dental caries on exam, there is no deformity to the jaw, there is no facial swelling Eyes:     Conjunctiva/sclera: Conjunctivae normal.  Cardiovascular:     Rate and Rhythm: Normal rate and regular rhythm.     Heart sounds: No murmur heard. Pulmonary:     Effort: Pulmonary effort is normal. No respiratory distress.     Breath sounds: Normal breath sounds.  Abdominal:     Palpations: Abdomen is soft.     Tenderness: There is no abdominal tenderness.  Musculoskeletal:        General: Tenderness present. No swelling.     Cervical back: Normal range of motion and neck supple.     Comments: Tenderness to the right knee, both hips  Skin:    General: Skin is warm and dry.     Capillary Refill: Capillary refill takes less than 2 seconds.  Neurological:     Mental Status: She is alert.  Psychiatric:        Mood and Affect: Mood normal.     ED Results / Procedures / Treatments   Labs (all labs ordered are listed, but only abnormal results are displayed) Labs Reviewed - No data to display   EKG None  Radiology DG Pelvis 1-2 Views  Result Date: 04/20/2023 CLINICAL DATA:  Bilateral hip pain for 3 days without known injury. EXAM: PELVIS - 1-2 VIEW COMPARISON:  None Available. FINDINGS: There is no evidence of pelvic fracture or diastasis. No pelvic bone lesions are seen. IMPRESSION: Negative. Electronically Signed   By: Lupita Raider M.D.   On: 04/20/2023 19:46   DG Knee Complete 4 Views Right  Result Date: 04/20/2023 CLINICAL DATA:  Right knee pain for 3 days without known injury. EXAM: RIGHT KNEE - COMPLETE 4+  VIEW COMPARISON:  January 26, 2023. FINDINGS: No evidence of fracture, dislocation, or joint effusion. No significant joint space narrowing is noted. Stable sclerosis is seen involving the lateral femoral condyle concerning for avascular necrosis or possibly osteochondral injury. Soft tissues are unremarkable. IMPRESSION: Stable sclerosis involving the lateral femoral condyle consistent with avascular necrosis or osteochondral injury. Correlation with MRI is recommended. No acute fracture or dislocation is noted. Electronically Signed  By: Lupita Raider M.D.   On: 04/20/2023 19:45    Procedures Procedures    Medications Ordered in ED Medications  morphine (PF) 2 MG/ML injection 2 mg (2 mg Intramuscular Given 04/20/23 1927)    ED Course/ Medical Decision Making/ A&P                                 Medical Decision Making Amount and/or Complexity of Data Reviewed Labs: ordered. Radiology: ordered.  Risk Prescription drug management.   Domingo Sep is here with jaw pain hip pain knee pain.  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  I obtained new x-rays of her pelvis and knee that are unremarkable.  I do not see any fractures.  Her jaw pain is likely dental pain as Multiple dental caries on exam.  Have no concern for jaw fracture or bony issue at this time.  There is no facial swelling.  She is given a shot of IM morphine.  I will write her short course of oxycodone for her breakthrough pain but overall I suspect chronic processes.  I will give her antibiotic for dental pain.  Will have her follow-up with her primary care doctor.  Discharged in good condition.  This chart was dictated using voice recognition software.  Despite best efforts to proofread,  errors can occur which can change the documentation meaning.         Final Clinical Impression(s) / ED Diagnoses Final diagnoses:  Jaw pain  Hip pain, unspecified  laterality  Acute knee pain, unspecified laterality    Rx / DC Orders ED Discharge Orders          Ordered    oxyCODONE (ROXICODONE) 5 MG immediate release tablet  Every 6 hours PRN        04/20/23 1922    amoxicillin (AMOXIL) 500 MG capsule  2 times daily        04/20/23 1922              [place], DO 04/20/23 1950

## 2023-04-20 NOTE — ED Notes (Signed)
 Discharge paperwork reviewed entirely with patient, including follow up care. Pain was under control. The patient received instruction and coaching on their prescriptions, and all follow-up questions were answered.  Pt verbalized understanding as well as all parties involved. No questions or concerns voiced at the time of discharge. No acute distress noted.   Pt ambulated out to PVA without incident or assistance.  Pt advised they will notify their PCP immediately., Pt advised they will seek followup care with a specialist and followup with their PCP. , and Pt was given information to obtain and notify a PCP.

## 2023-04-20 NOTE — ED Triage Notes (Signed)
Pt reports left jaw pain x 3 days associated with pain to both hips and her right knee. Denies injury but reports hx of AVN and osteonecrosis. She is ambulatory with independent steady gait.

## 2023-04-20 NOTE — ED Notes (Signed)
ED Provider at bedside. 

## 2023-04-20 NOTE — Discharge Instructions (Signed)
No acute findings on your x-rays today.  Follow-up with your primary care doctors.  Roxicodone is a narcotic pain medicine.  Do not drive or do dangerous activities while taking this medicine.

## 2023-04-21 ENCOUNTER — Encounter (HOSPITAL_COMMUNITY): Payer: Self-pay | Admitting: Emergency Medicine

## 2023-04-21 ENCOUNTER — Emergency Department (HOSPITAL_COMMUNITY)
Admission: EM | Admit: 2023-04-21 | Discharge: 2023-04-21 | Disposition: A | Discharge status: Home / Self Care | Payer: 59 | Attending: Emergency Medicine | Admitting: Emergency Medicine

## 2023-04-21 ENCOUNTER — Emergency Department (HOSPITAL_COMMUNITY): Payer: 59

## 2023-04-21 DIAGNOSIS — K029 Dental caries, unspecified: Secondary | ICD-10-CM | POA: Diagnosis not present

## 2023-04-21 DIAGNOSIS — R6884 Jaw pain: Secondary | ICD-10-CM

## 2023-04-21 DIAGNOSIS — Z7982 Long term (current) use of aspirin: Secondary | ICD-10-CM | POA: Diagnosis not present

## 2023-04-21 MED ORDER — MORPHINE SULFATE (PF) 4 MG/ML IV SOLN
4.0000 mg | Freq: Once | INTRAVENOUS | Status: AC
  Administered 2023-04-21: 4 mg via INTRAVENOUS
  Filled 2023-04-21: qty 1

## 2023-04-21 NOTE — ED Provider Notes (Signed)
[place] [facility] AT [facility] Provider Note   CSN: [id number] Arrival date & time: 04/21/23  1440     History  No chief complaint on file.   Diane Stein is a 37 y.o. female with history of AVN and osteonecrosis of the knees bilaterally, presents with concern for left-sided jaw pain for the past 5 days. Denies any fever, chills, recent dental work, or swelling.  She did not pick up the amoxicillin and oxycodone prescribed at her ER visit yesterday.  Requests pain medication here and MRI imaging for her hip.  HPI     Home Medications Prior to Admission medications   Medication Sig Start Date End Date Taking? Authorizing Provider  alprazolam Prudy Feeler) 2 MG tablet Take 2 mg by mouth 3 (three) times daily as needed for anxiety.    [provider]  amoxicillin (AMOXIL) 500 MG capsule Take 1 capsule (500 mg total) by mouth 2 (two) times daily for 7 days. 04/20/23 04/27/23  Curatolo, Adam, DO  aspirin EC 81 MG tablet Take 1 tablet (81 mg total) by mouth 2 (two) times daily. For DVT prophylaxis for 30 days after surgery. 08/08/20   Jenne Pane, PA-C  azithromycin (ZITHROMAX) 250 MG tablet Take 2 tablets today, then 1 tablet daily for the next 4 days. 12/29/22   Glyn Ade, MD  benzonatate (TESSALON) 100 MG capsule Take 1 capsule (100 mg total) by mouth every 8 (eight) hours. 03/29/23   Schutt, Edsel Petrin, PA-C  chlorpheniramine-HYDROcodone (TUSSIONEX) 10-8 MG/5ML Take 5 mLs by mouth every 12 (twelve) hours as needed for cough. 01/01/23   Achille Rich, PA-C  cyclobenzaprine (FLEXERIL) 10 MG tablet Take 1 tablet (10 mg total) by mouth 2 (two) times daily as needed for muscle spasms. 02/24/23   Schutt, Edsel Petrin, PA-C  doxepin (SINEQUAN) 50 MG capsule Take 50-100 mg by mouth at bedtime as needed (sleep).     [provider]  EPINEPHrine 0.3 mg/0.3 mL IJ SOAJ injection Inject 0.3 mLs (0.3 mg total) into the muscle once as needed for up to 1  dose (anaphalaxis). 01/09/18   Loren Racer, MD  FLUoxetine (PROZAC) 20 MG capsule Take 60 mg by mouth daily.    [provider]  HYDROcodone-acetaminophen (NORCO/VICODIN) 5-325 MG tablet Take 1 tablet by mouth every 6 (six) hours as needed. 03/29/23   Schutt, Edsel Petrin, PA-C  hydrOXYzine (ATARAX/VISTARIL) 10 MG tablet Take 10 mg by mouth See admin instructions. Take 10 mg daily, may take a second 10 mg dose as needed for anxiety    [provider]  levocetirizine (XYZAL) 5 MG tablet Take 5 mg by mouth every evening.    [provider]  melatonin 3 MG TABS tablet Take 3 mg by mouth at bedtime as needed (sleep).    [provider]  methocarbamol (ROBAXIN) 500 MG tablet Take 1 tablet (500 mg total) by mouth every 8 (eight) hours as needed for muscle spasms. 08/08/20   Jenne Pane, PA-C  omeprazole (PRILOSEC OTC) 20 MG tablet Take 1 tablet (20 mg total) by mouth daily. For gastric protection while taking pain medicines. 08/08/20 09/07/20  Jenne Pane, PA-C  ondansetron (ZOFRAN) 4 MG tablet Take 1 tablet (4 mg total) by mouth daily as needed for nausea or vomiting. 08/08/20   Jenne Pane, PA-C  oxyCODONE (ROXICODONE) 5 MG immediate release tablet Take 1 tablet (5 mg total) by mouth every 6 (six) hours as needed for up to 10  doses. 04/20/23   Curatolo, Adam, DO  potassium chloride SA (KLOR-CON M) 20 MEQ tablet Take 1 tablet (20 mEq total) by mouth 2 (two) times daily. 12/29/22   Glyn Ade, MD  rosuvastatin (CRESTOR) 10 MG tablet Take 10 mg by mouth at bedtime.    [provider]      Allergies    Avocado, Other, and Benadryl [diphenhydramine]    Review of Systems   Review of Systems  Constitutional:  Negative for fever.  Musculoskeletal:        Jaw pain    Physical Exam Updated Vital Signs BP 100/78   Pulse 93   Temp 98 F (36.7 C) (Oral)   Resp 18   LMP  (LMP Unknown)   SpO2 100%  Physical Exam Vitals and nursing note  reviewed.  Constitutional:      Appearance: Normal appearance.  HENT:     Head: Atraumatic.     Comments: No obvious deformity, erythema, edema overlying the jaw.  Poor dentition throughout the mouth.  No obvious dental abscesses.  No tenderness palpation of the left maxilla where she states her pain is.  No trismus, neck soft and supple Cardiovascular:     Rate and Rhythm: Normal rate and regular rhythm.  Pulmonary:     Effort: Pulmonary effort is normal.  Neurological:     General: No focal deficit present.     Mental Status: She is alert.  Psychiatric:        Mood and Affect: Mood normal.        Behavior: Behavior normal.     ED Results / Procedures / Treatments   Labs (all labs ordered are listed, but only abnormal results are displayed) Labs Reviewed - No data to display  EKG None  Radiology CT Maxillofacial Wo Contrast  Result Date: 04/21/2023 CLINICAL DATA:  Fall and mandibular pain. History of avascular necrosis. EXAM: CT MAXILLOFACIAL WITHOUT CONTRAST TECHNIQUE: Multidetector CT imaging of the maxillofacial structures was performed. Multiplanar CT image reconstructions were also generated. RADIATION DOSE REDUCTION: This exam was performed according to the departmental dose-optimization program which includes automated exposure control, adjustment of the mA and/or kV according to patient size and/or use of iterative reconstruction technique. COMPARISON:  Head CT dated 01/26/2023. Facial bone CT dated 04/26/2019. FINDINGS: Osseous: No acute fracture. No mandibular dislocation. Extensive dental caries and periapical lucencies. Orbits: The globes and vertebra are preserved. Sinuses: Clear. Soft tissues: Negative. Limited intracranial: No significant or unexpected finding. IMPRESSION: 1. No acute facial bone fracture. 2. Extensive dental caries and periapical lucencies. Electronically Signed   By: Elgie Collard M.D.   On: 04/21/2023 21:13   DG Pelvis 1-2 Views  Result  Date: 04/20/2023 CLINICAL DATA:  Bilateral hip pain for 3 days without known injury. EXAM: PELVIS - 1-2 VIEW COMPARISON:  None Available. FINDINGS: There is no evidence of pelvic fracture or diastasis. No pelvic bone lesions are seen. IMPRESSION: Negative. Electronically Signed   By: Lupita Raider M.D.   On: 04/20/2023 19:46   DG Knee Complete 4 Views Right  Result Date: 04/20/2023 CLINICAL DATA:  Right knee pain for 3 days without known injury. EXAM: RIGHT KNEE - COMPLETE 4+ VIEW COMPARISON:  January 26, 2023. FINDINGS: No evidence of fracture, dislocation, or joint effusion. No significant joint space narrowing is noted. Stable sclerosis is seen involving the lateral femoral condyle concerning for avascular necrosis or possibly osteochondral injury. Soft tissues are unremarkable. IMPRESSION: Stable sclerosis involving the lateral femoral condyle  consistent with avascular necrosis or osteochondral injury. Correlation with MRI is recommended. No acute fracture or dislocation is noted. Electronically Signed   By: Lupita Raider M.D.   On: 04/20/2023 19:45    Procedures Procedures    Medications Ordered in ED Medications  morphine (PF) 4 MG/ML injection 4 mg (4 mg Intravenous Given 04/21/23 1940)  morphine (PF) 4 MG/ML injection 4 mg (4 mg Intravenous Given 04/21/23 2128)    ED Course/ Medical Decision Making/ A&P                                 Medical Decision Making Amount and/or Complexity of Data Reviewed Radiology: ordered.  Risk Prescription drug management.     Differential diagnosis includes but is not limited to dental caries, dental abscess, hypertension, avascular necrosis  ED Course:  Patient overall well-appearing, stable vital signs.  She has poor dentition throughout her mouth which could be contributing to her pain.  She has a history of osteonecrosis and AVN, requests imaging to further evaluate her jaw pain.  I discussed this case with Dr. Lockie Mola who  evaluated patient yesterday.  He recommended obtaining CT maxillofacial which revealed no acute abnormalities but extensive dental caries.  Patient given IV morphine for pain at recommendation of Dr. Lockie Mola.  Low concern for dental infection at this time given stable vitals and no obvious erythema or abscess, but will patient continue on amoxicillin prescribed yesterday. No concern for ludwig angina. Upon re-evaluation, patient states her pain is improved.  Discussed that she needs to follow-up with a dentist to receive care for her dental caries which is likely causing her pain.  Impression: Left sided jaw pain Extensive dental caries  Disposition:  The patient was discharged home with instructions to follow-up with dentist to address her dental caries.  Continue on amoxicillin prescribed yesterday.  Tylenol and ibuprofen as needed for pain at home. Return precautions given.    Imaging Studies ordered: I ordered imaging studies including CT maxillofacial I independently visualized the imaging with scope of interpretation limited to determining acute life threatening conditions related to emergency care. Imaging showed no acute abnormalities, extensive dental caries I agree with the radiologist interpretation    External records from outside source obtained and reviewed including ER visit from yesterday for her jaw pain, was prescribed amoxicillin and oxycodone              Final Clinical Impression(s) / ED Diagnoses Final diagnoses:  Pain in upper jaw  Dental caries    Rx / DC Orders ED Discharge Orders     None         Arabella Merles, PA-C 04/21/23 2209    Virgina Norfolk, DO 04/21/23 2252

## 2023-04-21 NOTE — Discharge Instructions (Signed)
Your CT scan showed that you have multiple cavities.  Please establish care with a dentist to have these addressed.  These could be causing your jaw pain.   You may take up to 1000mg  of tylenol every 6 hours as needed for pain.  Do not take more then 4g per day.  You may use up to 800mg  ibuprofen every 8 hours as needed for pain.  Do not exceed 2.4g of ibuprofen per day.  Please continue taking the amoxicillin as prescribed.  Take the oxycodone prescribed yesterday as needed for breakthrough pain not controlled with Tylenol and ibuprofen.   Return the ER for fever, severe worsening pain, any other new or concerning symptoms.

## 2023-04-21 NOTE — ED Notes (Signed)
Patient transported to CT 

## 2023-04-21 NOTE — ED Triage Notes (Signed)
Pt here from home with continued pain in her left jaw and bil hip and right knee, Pwas seen yesterday for same has AVN and osteonecrosis

## 2023-05-23 ENCOUNTER — Other Ambulatory Visit: Payer: Self-pay

## 2023-05-23 ENCOUNTER — Emergency Department (HOSPITAL_BASED_OUTPATIENT_CLINIC_OR_DEPARTMENT_OTHER)
Admission: EM | Admit: 2023-05-23 | Discharge: 2023-05-23 | Disposition: A | Discharge status: Home / Self Care | Payer: Medicaid Other | Attending: Emergency Medicine | Admitting: Emergency Medicine

## 2023-05-23 ENCOUNTER — Emergency Department (HOSPITAL_BASED_OUTPATIENT_CLINIC_OR_DEPARTMENT_OTHER): Payer: Medicaid Other

## 2023-05-23 ENCOUNTER — Encounter (HOSPITAL_BASED_OUTPATIENT_CLINIC_OR_DEPARTMENT_OTHER): Payer: Self-pay

## 2023-05-23 DIAGNOSIS — R000 Tachycardia, unspecified: Secondary | ICD-10-CM | POA: Diagnosis not present

## 2023-05-23 DIAGNOSIS — K029 Dental caries, unspecified: Secondary | ICD-10-CM | POA: Diagnosis not present

## 2023-05-23 DIAGNOSIS — Z7982 Long term (current) use of aspirin: Secondary | ICD-10-CM | POA: Diagnosis not present

## 2023-05-23 DIAGNOSIS — K0889 Other specified disorders of teeth and supporting structures: Secondary | ICD-10-CM | POA: Diagnosis present

## 2023-05-23 DIAGNOSIS — K047 Periapical abscess without sinus: Secondary | ICD-10-CM | POA: Diagnosis not present

## 2023-05-23 DIAGNOSIS — D72829 Elevated white blood cell count, unspecified: Secondary | ICD-10-CM | POA: Diagnosis not present

## 2023-05-23 LAB — CBC WITH DIFFERENTIAL/PLATELET
Abs Immature Granulocytes: 0.03 10*3/uL (ref 0.00–0.07)
Basophils Absolute: 0 10*3/uL (ref 0.0–0.1)
Basophils Relative: 0 %
Eosinophils Absolute: 0 10*3/uL (ref 0.0–0.5)
Eosinophils Relative: 0 %
HCT: 42.4 % (ref 36.0–46.0)
Hemoglobin: 14.1 g/dL (ref 12.0–15.0)
Immature Granulocytes: 0 %
Lymphocytes Relative: 22 %
Lymphs Abs: 2.7 10*3/uL (ref 0.7–4.0)
MCH: 30.2 pg (ref 26.0–34.0)
MCHC: 33.3 g/dL (ref 30.0–36.0)
MCV: 90.8 fL (ref 80.0–100.0)
Monocytes Absolute: 0.5 10*3/uL (ref 0.1–1.0)
Monocytes Relative: 4 %
Neutro Abs: 9.2 10*3/uL — ABNORMAL HIGH (ref 1.7–7.7)
Neutrophils Relative %: 74 %
Platelets: 289 10*3/uL (ref 150–400)
RBC: 4.67 MIL/uL (ref 3.87–5.11)
RDW: 13.8 % (ref 11.5–15.5)
WBC: 12.5 10*3/uL — ABNORMAL HIGH (ref 4.0–10.5)
nRBC: 0 % (ref 0.0–0.2)

## 2023-05-23 LAB — COMPREHENSIVE METABOLIC PANEL
ALT: 23 U/L (ref 0–44)
AST: 22 U/L (ref 15–41)
Albumin: 4.1 g/dL (ref 3.5–5.0)
Alkaline Phosphatase: 94 U/L (ref 38–126)
Anion gap: 10 (ref 5–15)
BUN: 11 mg/dL (ref 6–20)
CO2: 23 mmol/L (ref 22–32)
Calcium: 9 mg/dL (ref 8.9–10.3)
Chloride: 106 mmol/L (ref 98–111)
Creatinine, Ser: 0.7 mg/dL (ref 0.44–1.00)
GFR, Estimated: >60 mL/min (ref >60)
Glucose, Bld: 100 mg/dL — ABNORMAL HIGH (ref 70–99)
Potassium: 3.7 mmol/L (ref 3.5–5.1)
Sodium: 139 mmol/L (ref 135–145)
Total Bilirubin: 0.5 mg/dL (ref <1.2)
Total Protein: 7.5 g/dL (ref 6.5–8.1)

## 2023-05-23 MED ORDER — AMOXICILLIN-POT CLAVULANATE 875-125 MG PO TABS: 1.0000 | ORAL_TABLET | Freq: Two times a day (BID) | ORAL | 0 refills | Status: AC

## 2023-05-23 MED ORDER — FENTANYL CITRATE PF 50 MCG/ML IJ SOSY
12.5000 ug | PREFILLED_SYRINGE | Freq: Once | INTRAMUSCULAR | Status: AC
  Administered 2023-05-23: 12.5 ug via INTRAVENOUS
  Filled 2023-05-23: qty 1

## 2023-05-23 MED ORDER — METRONIDAZOLE 500 MG PO TABS
500.0000 mg | ORAL_TABLET | Freq: Once | ORAL | Status: AC
  Administered 2023-05-23: 500 mg via ORAL
  Filled 2023-05-23: qty 1

## 2023-05-23 MED ORDER — IOPAMIDOL (ISOVUE-370) INJECTION 76%: 75.0000 mL | Freq: Once | INTRAVENOUS | Status: DC | PRN

## 2023-05-23 MED ORDER — KETOROLAC TROMETHAMINE 15 MG/ML IJ SOLN
15.0000 mg | Freq: Once | INTRAMUSCULAR | Status: AC
Start: 2023-05-23 — End: 2023-05-23
  Administered 2023-05-23: 15 mg via INTRAVENOUS
  Filled 2023-05-23: qty 1

## 2023-05-23 MED ORDER — METRONIDAZOLE 500 MG PO TABS: 500.0000 mg | ORAL_TABLET | Freq: Two times a day (BID) | ORAL | 0 refills | Status: AC

## 2023-05-23 MED ORDER — IOHEXOL 300 MG/ML  SOLN
75.0000 mL | Freq: Once | INTRAMUSCULAR | Status: AC | PRN
  Administered 2023-05-23: 75 mL via INTRAVENOUS

## 2023-05-23 MED ORDER — AMOXICILLIN-POT CLAVULANATE 875-125 MG PO TABS
1.0000 | ORAL_TABLET | Freq: Once | ORAL | Status: AC
  Administered 2023-05-23: 1 via ORAL
  Filled 2023-05-23: qty 1

## 2023-05-23 NOTE — ED Provider Notes (Cosign Needed)
[place] [facility] Provider Note   CSN: [id number] Arrival date & time: 05/23/23  1209     History  Chief Complaint  Patient presents with   Facial Swelling    Diane Stein is a 37 y.o. female with history of tachycardia, AVN, presents with concern for left cheek swelling that started this morning.  Denies any recent dental work or injuries to the area.  Denies any fever or chills.  States the area is painful.  HPI     Home Medications Prior to Admission medications   Medication Sig Start Date End Date Taking? Authorizing Provider  amoxicillin-clavulanate (AUGMENTIN) 875-125 MG tablet Take 1 tablet by mouth every 12 (twelve) hours for 7 days. 05/23/23 05/30/23 Yes Arabella Merles, PA-C  metroNIDAZOLE (FLAGYL) 500 MG tablet Take 1 tablet (500 mg total) by mouth 2 (two) times daily for 7 days. 05/23/23 05/30/23 Yes Arabella Merles, PA-C  alprazolam Prudy Feeler) 2 MG tablet Take 2 mg by mouth 3 (three) times daily as needed for anxiety.    [provider]  aspirin EC 81 MG tablet Take 1 tablet (81 mg total) by mouth 2 (two) times daily. For DVT prophylaxis for 30 days after surgery. 08/08/20   Jenne Pane, PA-C  benzonatate (TESSALON) 100 MG capsule Take 1 capsule (100 mg total) by mouth every 8 (eight) hours. 03/29/23   Schutt, Edsel Petrin, PA-C  chlorpheniramine-HYDROcodone (TUSSIONEX) 10-8 MG/5ML Take 5 mLs by mouth every 12 (twelve) hours as needed for cough. 01/01/23   Achille Rich, PA-C  cyclobenzaprine (FLEXERIL) 10 MG tablet Take 1 tablet (10 mg total) by mouth 2 (two) times daily as needed for muscle spasms. 02/24/23   Schutt, Edsel Petrin, PA-C  doxepin (SINEQUAN) 50 MG capsule Take 50-100 mg by mouth at bedtime as needed (sleep).     [provider]  EPINEPHrine 0.3 mg/0.3 mL IJ SOAJ injection Inject 0.3 mLs (0.3 mg total) into the muscle once as needed for up to 1 dose (anaphalaxis). 01/09/18   Loren Racer, MD   FLUoxetine (PROZAC) 20 MG capsule Take 60 mg by mouth daily.    [provider]  HYDROcodone-acetaminophen (NORCO/VICODIN) 5-325 MG tablet Take 1 tablet by mouth every 6 (six) hours as needed. 03/29/23   Schutt, Edsel Petrin, PA-C  hydrOXYzine (ATARAX/VISTARIL) 10 MG tablet Take 10 mg by mouth See admin instructions. Take 10 mg daily, may take a second 10 mg dose as needed for anxiety    [provider]  levocetirizine (XYZAL) 5 MG tablet Take 5 mg by mouth every evening.    [provider]  melatonin 3 MG TABS tablet Take 3 mg by mouth at bedtime as needed (sleep).    [provider]  methocarbamol (ROBAXIN) 500 MG tablet Take 1 tablet (500 mg total) by mouth every 8 (eight) hours as needed for muscle spasms. 08/08/20   Jenne Pane, PA-C  omeprazole (PRILOSEC OTC) 20 MG tablet Take 1 tablet (20 mg total) by mouth daily. For gastric protection while taking pain medicines. 08/08/20 09/07/20  Jenne Pane, PA-C  ondansetron (ZOFRAN) 4 MG tablet Take 1 tablet (4 mg total) by mouth daily as needed for nausea or vomiting. 08/08/20   Jenne Pane, PA-C  oxyCODONE (ROXICODONE) 5 MG immediate release tablet Take 1 tablet (5 mg total) by mouth every 6 (six) hours as needed for up to 10 doses. 04/20/23   Curatolo, Adam, DO  potassium chloride SA (KLOR-CON M) 20  MEQ tablet Take 1 tablet (20 mEq total) by mouth 2 (two) times daily. 12/29/22   Glyn Ade, MD  rosuvastatin (CRESTOR) 10 MG tablet Take 10 mg by mouth at bedtime.    [provider]      Allergies    Avocado, Other, and Benadryl [diphenhydramine]    Review of Systems   Review of Systems  Constitutional:  Negative for fever.    Physical Exam Updated Vital Signs BP 127/67   Pulse 93   Temp 98.1 F (36.7 C) (Oral)   Resp 16   Ht 5\' 4"  (1.626 m)   Wt 68 kg   LMP  (LMP Unknown)   SpO2 100%   BMI 25.73 kg/m  Physical Exam Vitals and nursing note reviewed.  Constitutional:       Appearance: Normal appearance.  HENT:     Head: Atraumatic.      Comments: Swelling of the left cheek over parotid gland.  No overlying skin changes.  Tender to palpation over the left cheek.  Patient with poor dentition throughout mouth.  No obvious dental infections, or abscesses.  Patient able to swallow without difficulty, no trismus  Neck soft and supple Cardiovascular:     Rate and Rhythm: Regular rhythm. Tachycardia present.  Pulmonary:     Effort: Pulmonary effort is normal.  Neurological:     General: No focal deficit present.     Mental Status: She is alert.  Psychiatric:        Mood and Affect: Mood normal.        Behavior: Behavior normal.     ED Results / Procedures / Treatments   Labs (all labs ordered are listed, but only abnormal results are displayed) Labs Reviewed  CBC WITH DIFFERENTIAL/PLATELET - Abnormal; Notable for the following components:      Result Value   WBC 12.5 (*)    Neutro Abs 9.2 (*)    All other components within normal limits  COMPREHENSIVE METABOLIC PANEL - Abnormal; Notable for the following components:   Glucose, Bld 100 (*)    All other components within normal limits    EKG None  Radiology CT Maxillofacial W Contrast Result Date: 05/23/2023 CLINICAL DATA:  Swelling to left side of face. EXAM: CT MAXILLOFACIAL WITH CONTRAST TECHNIQUE: Multidetector CT imaging of the maxillofacial structures was performed with intravenous contrast. Multiplanar CT image reconstructions were also generated. RADIATION DOSE REDUCTION: This exam was performed according to the departmental dose-optimization program which includes automated exposure control, adjustment of the mA and/or kV according to patient size and/or use of iterative reconstruction technique. CONTRAST:  75mL OMNIPAQUE IOHEXOL 300 MG/ML  SOLN COMPARISON:  CT maxillofacial 04/21/2023. FINDINGS: Osseous: Multiple dental caries are again noted. Periapical lucencies are noted at the left  maxillary first and second molar roots. A 5 mm subperiosteal fluid collection is present along the lateral aspect of the F healer ridge at the second molar. Inflammatory changes in the left side of the face emanating from the space. Other dental caries are stable without associated soft tissue change. No acute or healing fractures are present. No focal osseous lesions are present. The mandible is intact located. Orbits: The globes and orbits are within normal limits. Sinuses: The paranasal sinuses and mastoid air cells are clear. Soft tissues: Subcutaneous soft tissue stranding is present over the left side of the face. This surrounds the left parotid duct. The salivary glands are within normal limits bilaterally. Limited intracranial: Within normal limits. IMPRESSION: 1. 5 mm  subperiosteal fluid collection along the lateral aspect of the left maxillary first and second molar roots compatible with a subperiosteal abscess related to the dental caries and periapical abscesses. 2. Inflammatory changes in the left side of the face emanating from the space. 3. Multiple dental caries are stable without associated soft tissue change. 4. The subcutaneous inflammatory changes surround the left parotid duct without involvement of the salivary glands. Electronically Signed   By: Marin Roberts M.D.   On: 05/23/2023 17:48    Procedures Procedures    Medications Ordered in ED Medications  metroNIDAZOLE (FLAGYL) tablet 500 mg (has no administration in time range)  amoxicillin-clavulanate (AUGMENTIN) 875-125 MG per tablet 1 tablet (has no administration in time range)  ketorolac (TORADOL) 15 MG/ML injection 15 mg (15 mg Intravenous Given 05/23/23 1531)  iohexol (OMNIPAQUE) 300 MG/ML solution 75 mL (75 mLs Intravenous Contrast Given 05/23/23 1624)  fentaNYL (SUBLIMAZE) injection 12.5 mcg (12.5 mcg Intravenous Given 05/23/23 1701)    ED Course/ Medical Decision Making/ A&P                                  Medical Decision Making Amount and/or Complexity of Data Reviewed Labs: ordered. Radiology: ordered.  Risk Prescription drug management.     Differential diagnosis includes but is not limited to parotitis, parotid gland abscess, dental abscess, dental infection  ED Course:  Patient well-appearing, stable vital signs.  She does have swelling to the left cheek area over what appears to be the parotid gland.  No areas of fluctuance felt or overlying skin changes.  CT maxillofacial was obtained which showed a likely subperiosteal abscess related to dental caries and a periapical abscess.  Labs show slight leukocytosis of 12.5, suspect this is related to her infection.  CMP unremarkable.  Patient given first dose of Augmentin and metronidazole here Patient given Toradol for pain control.  Upon reevaluation, states this has not helped with her pain.  Given fentanyl for pain.   I discussed these results with the patient.  She asked for something stronger than Tylenol and ibuprofen at home for pain.  I discussed this is not indicated at this time.  Patient stable and appropriate for discharge home.  Impression: Subperiosteal abscess and periapical abscess related to dental caries  Disposition:  The patient was discharged home with instructions to take course of metronidazole and Augmentin as prescribed.  Tylenol and ibuprofen as needed for pain.  Make an appointment with a dentist for management of dental caries. Return precautions given.               Final Clinical Impression(s) / ED Diagnoses Final diagnoses:  Dental abscess  Dental caries    Rx / DC Orders ED Discharge Orders          Ordered    metroNIDAZOLE (FLAGYL) 500 MG tablet  2 times daily        05/23/23 1816    amoxicillin-clavulanate (AUGMENTIN) 875-125 MG tablet  Every 12 hours        05/23/23 1816              Arabella Merles, PA-C 05/23/23 1818

## 2023-05-23 NOTE — Discharge Instructions (Addendum)
The CT scan today showed an abscess where your molars are.  This is the cause of your left cheek swelling.  This likely formed due to your dental cavities.  Please follow-up with a dentist as soon as possible for further management of your cavities to prevent this from happening in the future.  You have been prescribed metronidazole. Take this antibiotic 2 times a day for the next 7 days. You have been prescribed Augmentin. Take this antibiotic 2 times a day for the next 7 days. Take the full course of your antibiotic even if you start feeling better. Antibiotics may cause you to have diarrhea.  You are given your first dose of metronidazole and Augmentin here today.  Please pick these prescriptions up and start taking them tomorrow morning as prescribed.  Take 600mg  ibuprofen, then 3 hours later take 1000mg  tylenol, then 3 hours later 600mg  ibuprofen, then 3 hours later 1000mg  tylenol, so on and so forth for pain control.  Return the ER for any difficulty swallowing, fevers, worsening swelling, any other new or concerning symptoms.

## 2023-05-23 NOTE — ED Triage Notes (Signed)
The patient woke up with swelling to the left side of her face. She has had dental problems before.

## 2023-07-03 ENCOUNTER — Emergency Department (HOSPITAL_BASED_OUTPATIENT_CLINIC_OR_DEPARTMENT_OTHER): Payer: Medicaid Other

## 2023-07-03 ENCOUNTER — Other Ambulatory Visit: Payer: Self-pay

## 2023-07-03 ENCOUNTER — Encounter (HOSPITAL_BASED_OUTPATIENT_CLINIC_OR_DEPARTMENT_OTHER): Payer: Self-pay | Admitting: Emergency Medicine

## 2023-07-03 ENCOUNTER — Emergency Department (HOSPITAL_BASED_OUTPATIENT_CLINIC_OR_DEPARTMENT_OTHER)
Admission: EM | Admit: 2023-07-03 | Discharge: 2023-07-03 | Disposition: A | Discharge status: Home / Self Care | Payer: Medicaid Other | Attending: Emergency Medicine | Admitting: Emergency Medicine

## 2023-07-03 DIAGNOSIS — Z7951 Long term (current) use of inhaled steroids: Secondary | ICD-10-CM | POA: Diagnosis not present

## 2023-07-03 DIAGNOSIS — J111 Influenza due to unidentified influenza virus with other respiratory manifestations: Secondary | ICD-10-CM | POA: Insufficient documentation

## 2023-07-03 DIAGNOSIS — F17200 Nicotine dependence, unspecified, uncomplicated: Secondary | ICD-10-CM | POA: Insufficient documentation

## 2023-07-03 DIAGNOSIS — Z79899 Other long term (current) drug therapy: Secondary | ICD-10-CM | POA: Diagnosis not present

## 2023-07-03 DIAGNOSIS — J45909 Unspecified asthma, uncomplicated: Secondary | ICD-10-CM | POA: Insufficient documentation

## 2023-07-03 DIAGNOSIS — R059 Cough, unspecified: Secondary | ICD-10-CM | POA: Diagnosis present

## 2023-07-03 DIAGNOSIS — Z7982 Long term (current) use of aspirin: Secondary | ICD-10-CM | POA: Insufficient documentation

## 2023-07-03 LAB — CBC
HCT: 39.9 % (ref 36.0–46.0)
Hemoglobin: 13.4 g/dL (ref 12.0–15.0)
MCH: 30.5 pg (ref 26.0–34.0)
MCHC: 33.6 g/dL (ref 30.0–36.0)
MCV: 90.7 fL (ref 80.0–100.0)
Platelets: 207 10*3/uL (ref 150–400)
RBC: 4.4 MIL/uL (ref 3.87–5.11)
RDW: 13.7 % (ref 11.5–15.5)
WBC: 5.4 10*3/uL (ref 4.0–10.5)
nRBC: 0 % (ref 0.0–0.2)

## 2023-07-03 LAB — BASIC METABOLIC PANEL
Anion gap: 9 (ref 5–15)
BUN: 12 mg/dL (ref 6–20)
CO2: 23 mmol/L (ref 22–32)
Calcium: 9 mg/dL (ref 8.9–10.3)
Chloride: 107 mmol/L (ref 98–111)
Creatinine, Ser: 0.73 mg/dL (ref 0.44–1.00)
GFR, Estimated: >60 mL/min (ref >60)
Glucose, Bld: 109 mg/dL — ABNORMAL HIGH (ref 70–99)
Potassium: 3.3 mmol/L — ABNORMAL LOW (ref 3.5–5.1)
Sodium: 139 mmol/L (ref 135–145)

## 2023-07-03 LAB — GROUP A STREP BY PCR: Group A Strep by PCR: NOT DETECTED

## 2023-07-03 MED ORDER — ALBUTEROL SULFATE HFA 108 (90 BASE) MCG/ACT IN AERS: 2.0000 | INHALATION_SPRAY | RESPIRATORY_TRACT | Status: DC | PRN

## 2023-07-03 NOTE — ED Provider Notes (Signed)
 [place] [facility] Provider Note   CSN: [id number] Arrival date & time: 07/03/23  1459     History  Chief Complaint  Patient presents with   Shortness of Breath   Sore Throat    Diane Stein is a 38 y.o. female.  Patient with 1 week history of fever sore throat shortness of breath cough.  No nausea vomiting or diarrhea.  Has been using inhaler that they have at home they do have a history of asthma.  Has been some intermittent dizziness.  Temp here on arrival was 99.6 pulse was 104 oxygen saturation 97% respiration 16 blood pressure 102/79.  There is a family member with similar illness.  Patient's past medical history significant for bronchitis asthma pneumonia in 2014 2019.  Patient was worried about having pneumonia at this time.  Patient's had an abdominal hysterectomy 2019.  Patient is an everyday smoker.       Home Medications Prior to Admission medications   Medication Sig Start Date End Date Taking? Authorizing Provider  alprazolam  (XANAX ) 2 MG tablet Take 2 mg by mouth 3 (three) times daily as needed for anxiety.    [provider]  aspirin  EC 81 MG tablet Take 1 tablet (81 mg total) by mouth 2 (two) times daily. For DVT prophylaxis for 30 days after surgery. 08/08/20   Gawne, Meghan M, PA-C  benzonatate  (TESSALON ) 100 MG capsule Take 1 capsule (100 mg total) by mouth every 8 (eight) hours. 03/29/23   Schutt, Marsa HERO, PA-C  chlorpheniramine-HYDROcodone  (TUSSIONEX) 10-8 MG/5ML Take 5 mLs by mouth every 12 (twelve) hours as needed for cough. 01/01/23   Bernis Ernst, PA-C  cyclobenzaprine  (FLEXERIL ) 10 MG tablet Take 1 tablet (10 mg total) by mouth 2 (two) times daily as needed for muscle spasms. 02/24/23   Schutt, Marsa HERO, PA-C  doxepin  (SINEQUAN ) 50 MG capsule Take 50-100 mg by mouth at bedtime as needed (sleep).     [provider]  EPINEPHrine  0.3 mg/0.3 mL IJ SOAJ injection Inject 0.3 mLs (0.3 mg total)  into the muscle once as needed for up to 1 dose (anaphalaxis). 01/09/18   Carlyle Lenis, MD  FLUoxetine  (PROZAC ) 20 MG capsule Take 60 mg by mouth daily.    [provider]  HYDROcodone -acetaminophen  (NORCO/VICODIN) 5-325 MG tablet Take 1 tablet by mouth every 6 (six) hours as needed. 03/29/23   Schutt, Marsa HERO, PA-C  hydrOXYzine  (ATARAX /VISTARIL ) 10 MG tablet Take 10 mg by mouth See admin instructions. Take 10 mg daily, may take a second 10 mg dose as needed for anxiety    [provider]  levocetirizine (XYZAL) 5 MG tablet Take 5 mg by mouth every evening.    [provider]  melatonin 3 MG TABS tablet Take 3 mg by mouth at bedtime as needed (sleep).    [provider]  methocarbamol  (ROBAXIN ) 500 MG tablet Take 1 tablet (500 mg total) by mouth every 8 (eight) hours as needed for muscle spasms. 08/08/20   Gawne, Meghan M, PA-C  omeprazole  (PRILOSEC  OTC) 20 MG tablet Take 1 tablet (20 mg total) by mouth daily. For gastric protection while taking pain medicines. 08/08/20 09/07/20  Gawne, Meghan M, PA-C  ondansetron  (ZOFRAN ) 4 MG tablet Take 1 tablet (4 mg total) by mouth daily as needed for nausea or vomiting. 08/08/20   Gawne, Meghan M, PA-C  oxyCODONE  (ROXICODONE ) 5 MG immediate release tablet Take 1 tablet (5 mg total) by mouth every 6 (six)  hours as needed for up to 10 doses. 04/20/23   Curatolo, Adam, DO  potassium chloride  SA (KLOR-CON  M) 20 MEQ tablet Take 1 tablet (20 mEq total) by mouth 2 (two) times daily. 12/29/22   Jerral Meth, MD  rosuvastatin  (CRESTOR ) 10 MG tablet Take 10 mg by mouth at bedtime.    [provider]      Allergies    Avocado, Other, and Benadryl  [diphenhydramine ]    Review of Systems   Review of Systems  Constitutional:  Positive for fever. Negative for chills.  HENT:  Positive for congestion and sore throat. Negative for ear pain.   Eyes:  Negative for pain and visual disturbance.  Respiratory:  Positive for cough.  Negative for shortness of breath.   Cardiovascular:  Negative for chest pain and palpitations.  Gastrointestinal:  Negative for abdominal pain, diarrhea, nausea and vomiting.  Genitourinary:  Negative for dysuria and hematuria.  Musculoskeletal:  Negative for arthralgias and back pain.  Skin:  Negative for color change and rash.  Neurological:  Negative for seizures and syncope.  All other systems reviewed and are negative.   Physical Exam Updated Vital Signs BP 111/72   Pulse 96   Temp 99.6 F (37.6 C)   Resp 16   Ht 1.626 m (5' 4)   Wt 68 kg   LMP  (LMP Unknown)   SpO2 95%   BMI 25.75 kg/m  Physical Exam Vitals and nursing note reviewed.  Constitutional:      General: She is not in acute distress.    Appearance: Normal appearance. She is well-developed.  HENT:     Head: Normocephalic and atraumatic.     Mouth/Throat:     Mouth: Mucous membranes are moist.     Pharynx: Oropharynx is clear. No oropharyngeal exudate or posterior oropharyngeal erythema.  Eyes:     Extraocular Movements: Extraocular movements intact.     Conjunctiva/sclera: Conjunctivae normal.     Pupils: Pupils are equal, round, and reactive to light.  Cardiovascular:     Rate and Rhythm: Normal rate and regular rhythm.     Heart sounds: No murmur heard. Pulmonary:     Effort: Pulmonary effort is normal. No respiratory distress.     Breath sounds: Normal breath sounds. No wheezing, rhonchi or rales.  Abdominal:     Palpations: Abdomen is soft.     Tenderness: There is no abdominal tenderness.  Musculoskeletal:        General: No swelling.     Cervical back: Neck supple.  Skin:    General: Skin is warm and dry.     Capillary Refill: Capillary refill takes less than 2 seconds.  Neurological:     General: No focal deficit present.     Mental Status: She is alert and oriented to person, place, and time.  Psychiatric:        Mood and Affect: Mood normal.     ED Results / Procedures / Treatments    Labs (all labs ordered are listed, but only abnormal results are displayed) Labs Reviewed  BASIC METABOLIC PANEL - Abnormal; Notable for the following components:      Result Value   Potassium 3.3 (*)    Glucose, Bld 109 (*)    All other components within normal limits  GROUP A STREP BY PCR  CBC    EKG EKG Interpretation Date/Time:  Friday July 03 2023 15:54:02 EST Ventricular Rate:  109 PR Interval:  122 QRS Duration:  93 QT  Interval:  376 QTC Calculation: 507 R Axis:   89  Text Interpretation: No significant change since last tracing Sinus tachycardia Borderline repolarization abnormality Borderline prolonged QT interval Baseline wander in lead(s) V1 V5 V6 Confirmed by Demetrie Borge [id number]) on 07/03/2023 5:09:45 PM  Radiology DG Chest 2 View Result Date: 07/03/2023 CLINICAL DATA:  Shortness of breath with exertion. EXAM: CHEST - 2 VIEW COMPARISON:  Chest radiograph 03/29/2023 FINDINGS: The heart size and mediastinal contours are within normal limits. Both lungs are clear. The visualized skeletal structures are unremarkable. IMPRESSION: No active cardiopulmonary disease. Electronically Signed   By: Bard Moats M.D.   On: 07/03/2023 16:42    Procedures Procedures    Medications Ordered in ED Medications  albuterol  (VENTOLIN  HFA) 108 (90 Base) MCG/ACT inhaler 2 puff (has no administration in time range)    ED Course/ Medical Decision Making/ A&P                                 Medical Decision Making Amount and/or Complexity of Data Reviewed Labs: ordered. Radiology: ordered.  Risk Prescription drug management.   Patient is metabolic panel significant for potassium of 3.3 renal functions normal.  White count 5.4 hemoglobin 13.4.  2 view chest x-ray no active cardiopulmonary disease.  Patient's oxygen saturations are 97%.  Most likely this is influenza.  Patient claims that they swabbed her nose.  But there is no record of that being ordered.  Patient does not  want it rechecked.  She was mostly worried about having pneumonia.  This negative.  Patient was treated symptomatically.  Continue her inhaler.  And take over-the-counter medicines for cough.  She will return for any new or worse symptoms.  Final Clinical Impression(s) / ED Diagnoses Final diagnoses:  Influenza-like illness    Rx / DC Orders ED Discharge Orders     None         Geraldene Hamilton, MD 07/03/23 1722

## 2023-07-03 NOTE — ED Notes (Signed)
 Pt advised been sick for 6+ days, but acutely worse symptoms over last 3. Productive cough but has not spit up any mucous to tell the color. Chills and aches, and flushed sensation as well. Hx of bronchitis and PNA.

## 2023-07-03 NOTE — Discharge Instructions (Signed)
 X-ray negative oxygen levels are good here.  Strep throat was negative.  Most likely this is influenza.  Symptomatic treatment recommended continue to use your inhaler.  Also would recommend over-the-counter cough type medicine like Delsym .  Return for any new or worse symptoms.

## 2023-07-03 NOTE — ED Notes (Signed)
Pt was discharged by another RN

## 2023-07-03 NOTE — ED Triage Notes (Addendum)
 Pt POV steady gait-  Pt c/o fever, sore throat, ShOB x1 week, progressively worsening.  Poor po intake, intermittent dizziness. Hx of asthma.  MDI use yesterday.   Took tylenol  1030 today.

## 2023-08-07 DIAGNOSIS — N912 Amenorrhea, unspecified: Secondary | ICD-10-CM | POA: Insufficient documentation

## 2023-08-07 DIAGNOSIS — G4700 Insomnia, unspecified: Secondary | ICD-10-CM | POA: Insufficient documentation

## 2023-08-07 DIAGNOSIS — J4520 Mild intermittent asthma, uncomplicated: Secondary | ICD-10-CM | POA: Insufficient documentation

## 2024-01-08 ENCOUNTER — Emergency Department (HOSPITAL_BASED_OUTPATIENT_CLINIC_OR_DEPARTMENT_OTHER)
Admission: EM | Admit: 2024-01-08 | Discharge: 2024-01-08 | Disposition: A | Discharge status: Home / Self Care | Source: Ambulatory Visit | Attending: Emergency Medicine | Admitting: Emergency Medicine

## 2024-01-08 ENCOUNTER — Other Ambulatory Visit: Payer: Self-pay

## 2024-01-08 ENCOUNTER — Encounter (HOSPITAL_BASED_OUTPATIENT_CLINIC_OR_DEPARTMENT_OTHER): Payer: Self-pay

## 2024-01-08 DIAGNOSIS — K0889 Other specified disorders of teeth and supporting structures: Secondary | ICD-10-CM | POA: Diagnosis present

## 2024-01-08 DIAGNOSIS — Z7982 Long term (current) use of aspirin: Secondary | ICD-10-CM | POA: Diagnosis not present

## 2024-01-08 DIAGNOSIS — G8918 Other acute postprocedural pain: Secondary | ICD-10-CM | POA: Diagnosis not present

## 2024-01-08 DIAGNOSIS — K029 Dental caries, unspecified: Secondary | ICD-10-CM | POA: Insufficient documentation

## 2024-01-08 MED ORDER — OXYCODONE-ACETAMINOPHEN 5-325 MG PO TABS: 1.0000 | ORAL_TABLET | Freq: Four times a day (QID) | ORAL | 0 refills | Status: AC | PRN

## 2024-01-08 NOTE — ED Provider Notes (Signed)
 [place] EMERGENCY DEPARTMENT AT [facility] Provider Note   CSN: [id number] Arrival date & time: 01/08/24  1535     Patient presents with: Dental Pain   Diane Stein is a 38 y.o. female.   Patient presents to the emergency department for evaluation of dental pain.  Patient had all of her teeth extracted 3 days ago.  She was prescribed # 8 tablets of Vicodin.  She is prescribed monthly prescriptions for oxycodone /acetaminophen  10-325 for chronic pain.  States that she is seeing her doctor in 2 days for refill.  Reviewed [place]  substance reporting database, previous prescription filled 12/09/2023.  Patient noted a little swelling in the right upper jaw.  She is on Augmentin .  She states that pain is uncontrolled and she has been having difficulty sleeping.       Prior to Admission medications   Medication Sig Start Date End Date Taking? Authorizing Provider  alprazolam  (XANAX ) 2 MG tablet Take 2 mg by mouth 3 (three) times daily as needed for anxiety.    [provider]  aspirin  EC 81 MG tablet Take 1 tablet (81 mg total) by mouth 2 (two) times daily. For DVT prophylaxis for 30 days after surgery. 08/08/20   Gawne, Meghan M, PA-C  benzonatate  (TESSALON ) 100 MG capsule Take 1 capsule (100 mg total) by mouth every 8 (eight) hours. 03/29/23   Schutt, Marsa HERO, PA-C  chlorpheniramine-HYDROcodone  (TUSSIONEX) 10-8 MG/5ML Take 5 mLs by mouth every 12 (twelve) hours as needed for cough. 01/01/23   Bernis Ernst, PA-C  cyclobenzaprine  (FLEXERIL ) 10 MG tablet Take 1 tablet (10 mg total) by mouth 2 (two) times daily as needed for muscle spasms. 02/24/23   Schutt, Marsa HERO, PA-C  doxepin  (SINEQUAN ) 50 MG capsule Take 50-100 mg by mouth at bedtime as needed (sleep).     [provider]  EPINEPHrine  0.3 mg/0.3 mL IJ SOAJ injection Inject 0.3 mLs (0.3 mg total) into the muscle once as needed for up to 1 dose (anaphalaxis). 01/09/18   Carlyle Lenis, MD   FLUoxetine  (PROZAC ) 20 MG capsule Take 60 mg by mouth daily.    [provider]  HYDROcodone -acetaminophen  (NORCO/VICODIN) 5-325 MG tablet Take 1 tablet by mouth every 6 (six) hours as needed. 03/29/23   Schutt, Marsa HERO, PA-C  hydrOXYzine  (ATARAX /VISTARIL ) 10 MG tablet Take 10 mg by mouth See admin instructions. Take 10 mg daily, may take a second 10 mg dose as needed for anxiety    [provider]  levocetirizine (XYZAL) 5 MG tablet Take 5 mg by mouth every evening.    [provider]  melatonin 3 MG TABS tablet Take 3 mg by mouth at bedtime as needed (sleep).    [provider]  methocarbamol  (ROBAXIN ) 500 MG tablet Take 1 tablet (500 mg total) by mouth every 8 (eight) hours as needed for muscle spasms. 08/08/20   Gawne, Meghan M, PA-C  omeprazole  (PRILOSEC  OTC) 20 MG tablet Take 1 tablet (20 mg total) by mouth daily. For gastric protection while taking pain medicines. 08/08/20 09/07/20  Gawne, Meghan M, PA-C  ondansetron  (ZOFRAN ) 4 MG tablet Take 1 tablet (4 mg total) by mouth daily as needed for nausea or vomiting. 08/08/20   Gawne, Meghan M, PA-C  oxyCODONE  (ROXICODONE ) 5 MG immediate release tablet Take 1 tablet (5 mg total) by mouth every 6 (six) hours as needed for up to 10 doses. 04/20/23   Curatolo, Adam, DO  potassium chloride  SA (KLOR-CON  M) 20 MEQ tablet  Take 1 tablet (20 mEq total) by mouth 2 (two) times daily. 12/29/22   Jerral Meth, MD  rosuvastatin  (CRESTOR ) 10 MG tablet Take 10 mg by mouth at bedtime.    [provider]    Allergies: Avocado, Other, and Benadryl  [diphenhydramine ]    Review of Systems  Updated Vital Signs BP (!) 149/92 (BP Location: Right Arm)   Pulse 99   Temp 97.6 F (36.4 C) (Tympanic)   Resp 18   Ht 5' 4 (1.626 m)   Wt 66.7 kg   LMP  (LMP Unknown)   SpO2 98%   BMI 25.23 kg/m   Physical Exam Vitals and nursing note reviewed.  Constitutional:      Appearance: She is well-developed.  HENT:      Head: Normocephalic and atraumatic.     Jaw: No trismus.     Right Ear: External ear normal.     Left Ear: External ear normal.     Nose: Nose normal.     Mouth/Throat:     Mouth: Mucous membranes are moist.     Dentition: Abnormal dentition. Dental caries present. No dental abscesses.     Pharynx: Uvula midline. No uvula swelling.     Tonsils: No tonsillar abscesses.     Comments: Sutures in place, upper and lower, bilateral consistent with recent dental extractions. Eyes:     Conjunctiva/sclera: Conjunctivae normal.  Neck:     Comments: No neck swelling or Ludwig's angina Musculoskeletal:     Cervical back: Normal range of motion and neck supple.  Lymphadenopathy:     Cervical: No cervical adenopathy.  Skin:    General: Skin is warm and dry.  Neurological:     Mental Status: She is alert.     (all labs ordered are listed, but only abnormal results are displayed) Labs Reviewed - No data to display  EKG: None  Radiology: No results found.   Procedures   Medications Ordered in the ED - No data to display  ED course  Patient seen and examined. History obtained directly from patient.  Reviewed substance reporting database.  Labs/EKG: None ordered.  Imaging: None ordered.  Medications/Fluids: Ordered: IM Toradol   Most recent vital signs reviewed and are as follows: BP (!) 149/92 (BP Location: Right Arm)   Pulse 99   Temp 97.6 F (36.4 C) (Tympanic)   Resp 18   Ht 5' 4 (1.626 m)   Wt 66.7 kg   LMP  (LMP Unknown)   SpO2 98%   BMI 25.23 kg/m   Initial impression: dental pain, postoperative  Plan: Discharge to home.   Prescriptions written for: Oxycodone /acetaminophen  # 10 tablets  Other home care instructions discussed: Avoidance of chewing or other activities that makes the symptoms worse. Eat soft foods if needed and maintain good hydration.   ED return instructions discussed: Encouraged patient to return with worsening facial or neck swelling,  difficulty breathing or swallowing, fever.   Follow-up instructions discussed: Patient encouraged to follow-up with provided dental referral, resources -- or their own dentist if able.                                    Medical Decision Making  Patient with multiple recent dental extractions.  She is here requesting pain control.  I can see that she has prescribed chronic pain medication.  She has the at the end of her prescription, no recent refills  other than what she got from her dental provider.  Patient given # 10 tablets of Percocet for postoperative pain.  She will need to obtain further refills from PCP.     Final diagnoses:  Acute post-operative pain    ED Discharge Orders     None          Desiderio Chew, PA-C 01/08/24 1654    Geraldene Hamilton, MD 01/12/24 2149

## 2024-01-08 NOTE — Discharge Instructions (Signed)
 Take pain medication as prescribed.  Follow-up with your doctor on Monday for refill of your chronic pain medication.  Continue Augmentin .  Follow-up with oral surgeon as planned.  Return with worsening facial swelling, fever, difficulty breathing or swallowing.

## 2024-01-08 NOTE — ED Triage Notes (Signed)
 Pt reports getting all of her teeth taken out x3 days ago at [facility]. Pt reports increased pain since. Pt reports being sent home with 6 Vicodin.

## 2024-02-22 NOTE — H&P (Signed)
 KNEE ARTHROPLASTY ADMISSION H&P  Patient ID: RICARDA ATAYDE MRN: [id number] DOB/AGE: 1986/04/01 38 y.o.  Chief Complaint: right knee pain.  Planned Procedure Date: 03/01/24 Medical and Cardiac Clearance by Charmaine Heller NP   PM&R Clearance by Mady Kirsch NP   HPI: Diane Stein is a 38 y.o. 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  Onset of symptoms was gradual, starting 4 years ago with gradually worsening course since that time. The patient noted prior procedures on the knee to include  arthroscopy and menisectomy on the right knee.  Patient currently rates pain at 9 out of 10 with activity. Patient has night pain, worsening of pain with activity and weight bearing, and pain that interferes with activities of daily living.  Patient has evidence of subchondral sclerosis, joint space narrowing, and and avascular lesions on the femoral condyles by imaging studies.  There is no active infection.  Past Medical History:  Diagnosis Date   Anxiety    Arthritis    Asthma    well controlled   Avascular necrosis (HCC)    left knee   Bronchitis    history   Depression    Dysrhythmia    rapid heart rate, released by cardiology 2016   Pneumonia 2014, 2019   History   SVD (spontaneous vaginal delivery)    x 1   Tachycardia 2016   Past Surgical History:  Procedure Laterality Date   ABDOMINAL HYSTERECTOMY     BONE MARROW BIOPSY Right 01/10/2020   Procedure: BONE MARROW ASPIRATION;  Surgeon: Beverley Evalene BIRCH, MD;  Location: [facility];  Service: Orthopedics;  Laterality: Right;   HYSTERECTOMY ABDOMINAL WITH SALPINGO-OOPHORECTOMY Bilateral 03/10/2018    Procedure: TOTAL ABDOMINAL HYSTERECTOMY WITH BILATERAL SALPINGO-OOPHORECTOMY;  Surgeon: Horacio Boas, MD;  Location: [facility];  Service: Gynecology;  Laterality: Bilateral;   KNEE ARTHROSCOPY Right 01/10/2020   Procedure: DIAGNOSTIC ARTHROSCOPY KNEE;  Surgeon: Beverley Evalene BIRCH, MD;  Location: [facility];  Service: Orthopedics;  Laterality: Right;   KNEE ARTHROSCOPY WITH SUBCHONDROPLASTY  01/10/2020   Procedure: KNEE ARTHROSCOPY WITH SUBCHONDROPLASTY;  Surgeon: Beverley Evalene BIRCH, MD;  Location: [facility];  Service: Orthopedics;;   KNEE ARTHROSCOPY WITH SUBCHONDROPLASTY Left 04/10/2020   Procedure: KNEE ARTHROSCOPY WITH SUBCHONDROPLASTY, HARVEST OF BONE GRAFT FROM ILIAC CREST;  Surgeon: Beverley Evalene BIRCH, MD;  Location: [facility];  Service: Orthopedics;  Laterality: Left;   LAPAROSCOPIC UNILATERAL SALPINGECTOMY Left 08/28/2017   Procedure: LAPAROSCOPIC UNILATERAL SALPINGECTOMY WITH REMOVAL OF PARA TUBAL CYST;  Surgeon: Horacio Boas, MD;  Location: [facility];  Service: Gynecology;  Laterality: Left;   LAPAROSCOPY Left 08/28/2017   Procedure: LAPAROSCOPY OPERATIVE, FULGERATION OF ENDOMETRIOSIS;  Surgeon: Horacio Boas, MD;  Location: [facility];  Service: Gynecology;  Laterality: Left;   TOOTH EXTRACTION Right 2008   TOTAL KNEE ARTHROPLASTY Left 08/07/2020   Procedure: TOTAL KNEE ARTHROPLASTY;  Surgeon: Beverley Evalene BIRCH, MD;  Location: [facility];  Service: Orthopedics;  Laterality: Left;   Allergies  Allergen Reactions   Avocado Anaphylaxis and Shortness Of Breath    dizziness   Other Hives    Pine Nuts = HIVES    Benadryl  [Diphenhydramine ]     Restless leg syndrome   Prior  to Admission medications   Medication Sig Start Date End Date Taking? Authorizing Provider  alprazolam  (XANAX ) 2 MG tablet Take 2 mg by mouth 3 (three) times daily as needed for anxiety.    [provider]  aspirin  EC 81 MG tablet Take 1 tablet (81 mg total) by mouth 2 (two)  times daily. For DVT prophylaxis for 30 days after surgery. 08/08/20   Anson Peddie M, PA-C  benzonatate  (TESSALON ) 100 MG capsule Take 1 capsule (100 mg total) by mouth every 8 (eight) hours. 03/29/23   Schutt, Marsa HERO, PA-C  cyclobenzaprine  (FLEXERIL ) 10 MG tablet Take 1 tablet (10 mg total) by mouth 2 (two) times daily as needed for muscle spasms. 02/24/23   Schutt, Marsa HERO, PA-C  doxepin  (SINEQUAN ) 50 MG capsule Take 50-100 mg by mouth at bedtime as needed (sleep).     [provider]  EPINEPHrine  0.3 mg/0.3 mL IJ SOAJ injection Inject 0.3 mLs (0.3 mg total) into the muscle once as needed for up to 1 dose (anaphalaxis). 01/09/18   Carlyle Lenis, MD  FLUoxetine  (PROZAC ) 20 MG capsule Take 60 mg by mouth daily.    [provider]  hydrOXYzine  (ATARAX /VISTARIL ) 10 MG tablet Take 10 mg by mouth See admin instructions. Take 10 mg daily, may take a second 10 mg dose as needed for anxiety    [provider]  levocetirizine (XYZAL) 5 MG tablet Take 5 mg by mouth every evening.    [provider]  melatonin 3 MG TABS tablet Take 3 mg by mouth at bedtime as needed (sleep).    [provider]  methocarbamol  (ROBAXIN ) 500 MG tablet Take 1 tablet (500 mg total) by mouth every 8 (eight) hours as needed for muscle spasms. 08/08/20   Virgen Belland M, PA-C  omeprazole  (PRILOSEC  OTC) 20 MG tablet Take 1 tablet (20 mg total) by mouth daily. For gastric protection while taking pain medicines. 08/08/20 09/07/20  Karime Scheuermann M, PA-C  ondansetron  (ZOFRAN ) 4 MG tablet Take 1 tablet (4 mg total) by mouth daily as needed for nausea or vomiting. 08/08/20   [place]on Warzecha M, PA-C  oxyCODONE -acetaminophen  (PERCOCET/ROXICET) 5-325 MG tablet Take 1 tablet by mouth every 6 (six) hours as needed for severe pain (pain score 7-10). 01/08/24   Desiderio Chew, PA-C  potassium chloride  SA (KLOR-CON  M) 20 MEQ tablet Take 1 tablet (20 mEq total) by mouth 2 (two) times daily. 12/29/22    Jerral Meth, MD  rosuvastatin  (CRESTOR ) 10 MG tablet Take 10 mg by mouth at bedtime.    [provider]   Social History   Socioeconomic History   Marital status: Single    Spouse name: Not on file   Number of children: 1   Years of education: Not on file   Highest education level: Not on file  Occupational History   Not on file  Tobacco Use   Smoking status: Every Day    Current packs/day: 0.25    Average packs/day: 0.3 packs/day for 10.0 years (2.5 ttl pk-yrs)    Types: Cigarettes   Smokeless tobacco: Never  Vaping Use   Vaping status: Some Days   Substances: Nicotine  Substance and Sexual Activity   Alcohol use: Never   Drug use: No   Sexual activity: Yes    Birth control/protection: Surgical  Other Topics Concern   Not on file  Social History Narrative   Not on file   Social Drivers of Health   Financial Resource Strain: Low Risk  (  08/07/2023)   Received from [facility]   Overall Financial Resource Strain (CARDIA)    Difficulty of Paying Living Expenses: Not hard at all  Food Insecurity: No Food Insecurity (08/07/2023)   Received from [facility]   Hunger Vital Sign    Within the past 12 months, you worried that your food would run out before you got the money to buy more.: Never true    Within the past 12 months, the food you bought just didn't last and you didn't have money to get more.: Never true  Transportation Needs: No Transportation Needs (08/07/2023)   Received from [facility] - Transportation    Lack of Transportation (Medical): No    Lack of Transportation (Non-Medical): No  Physical Activity: Not on file  Stress: Not on file  Social Connections: Unknown (10/05/2021)   Received from [facility]   Social Network    Social Network: Not on file   Family History  Problem Relation Age of Onset   Hypertension Mother    Heart disease Father    Heart disease Maternal Grandmother    Hypertension Maternal Grandmother     Heart disease Maternal Grandfather    Hypertension Maternal Grandfather    Heart disease Paternal Grandmother    Hypertension Paternal Grandmother    Heart disease Paternal Grandfather    Hypertension Paternal Grandfather     ROS: Currently denies lightheadedness, dizziness, Fever, chills, CP, SOB.   No personal history of DVT, PE, MI, or CVA. No remaining teeth due to full extraction for avascular necrosis of the jaw All other systems have been reviewed and were otherwise currently negative with the exception of those mentioned in the HPI and as above.  Objective: Vitals: Ht: 5'4 Wt: 162 lbs Temp: 98 BP: 124/76 Pulse: 75 O2 93% on room air.   Physical Exam: General: Alert, NAD.  Antalgic Gait  HEENT: EOMI, Good Neck Extension  Pulm: No increased work of breathing.  Clear B/L A/P w/o crackle or wheeze.  CV: RRR, No m/g/r appreciated  GI: soft, NT, ND. BS x 4 quadrants Neuro: CN II-XII grossly intact without focal deficit.  Sensation intact distally Skin: No lesions in the area of chief complaint MSK/Surgical Site:  + JLT. ROM 0-100 degrees.  4/5 strength in extension and flexion.  +EHL/FHL.  NVI.  Stable varus and valgus stress.    Imaging Review Plain radiographs demonstrate mild degenerative joint disease of the right knee.   MRI, however, shows evidence of avascular necrosis in the femoral condyles.  Bone quality appears to be poor for reported age and activity level, but consistent with what would be expected for avascular necrosis.   The overall alignment is neutral.   Preoperative templating of the joint replacement has been completed, documented, and submitted to the Operating Room personnel in order to optimize intra-operative equipment management.  Assessment: OA RIGHT KNEE Active Problems:   * No active hospital problems. *   Plan: Plan for Procedure(s): ARTHROPLASTY, KNEE, TOTAL  The patient history, physical exam, clinical judgement of the provider and imaging  are consistent with end stage degenerative joint disease and total joint arthroplasty is deemed medically necessary. The treatment options including medical management, injection therapy, and arthroplasty were discussed at length. The risks and benefits of Procedure(s): ARTHROPLASTY, KNEE, TOTAL were presented and reviewed.  The risks of nonoperative treatment, versus surgical intervention including but not limited to continued pain, aseptic loosening, stiffness, dislocation/subluxation, infection, bleeding, nerve injury, blood clots, cardiopulmonary  complications, morbidity, mortality, among others were discussed. The patient verbalizes understanding and wishes to proceed with the plan.  Patient is being admitted for inpatient treatment for surgery, pain control, PT, prophylactic antibiotics, VTE prophylaxis, progressive ambulation, ADL's and discharge planning.     The patient does meet the criteria for TXA which will be used perioperatively.   ASA 81 mg BID will be used postoperatively for DVT prophylaxis in addition to SCDs, and early ambulation. Plan for her to continue to take her normal daily dose of Percocet for chronic pain and we will provide additional oxycodone  for her to take on top of that for acute postop pain.  We will also use Tylenol  on its own and meloxicam. She will hold her cyclobenzaprine  and instead I will send Robaxin  for muscle spasms.   Zofran  for nausea and vomiting. Colace for constipation prevention, which she already has enough of at home.  Pharmacy- [facility] in [place] The patient is planning to be discharged home with OPPT and into the care of her mother Avelina who can be reached at [phone] Follow up appt 03/16/24 at 4:15pm     Gerard CHRISTELLA Ted DEVONNA Office [phone] 02/22/2024 6:50 PM

## 2024-02-22 NOTE — Progress Notes (Addendum)
 PCP - Charmaine Burkes, NP LOV 08-07-23 epic Cardiologist - Oneil Parchment LOV 12-24-23 with NP epic  PPM/ICD -  Device Orders -  Rep Notified -   Chest x-ray - 07-03-23 epic EKG - 07-03-23 Stress Test -  ECHO - 2015 epic Cardiac Cath -   Sleep Study -  CPAP -   Fasting Blood Sugar -  Checks Blood Sugar _____ times a day  Blood Thinner Instructions: Aspirin  Instructions:   PRE-SURGERY  G2-   COVID TEST-  COVID vaccine -  Activity-- Anesthesia review:   Patient denies shortness of breath, fever, cough and chest pain at PAT appointment   All instructions explained to the patient, with a verbal understanding of the material. Patient agrees to go over the instructions while at home for a better understanding. Patient also instructed to self quarantine after being tested for COVID-19. The opportunity to ask questions was provided.

## 2024-02-22 NOTE — Patient Instructions (Signed)
 SURGICAL WAITING ROOM VISITATION  Patients having surgery or a procedure may have no more than 2 support people in the waiting area - these visitors may rotate.    Children under the age of 48 must have an adult with them who is not the patient.  Visitors with respiratory illnesses are discouraged from visiting and should remain at home.  If the patient needs to stay at the hospital during part of their recovery, the visitor guidelines for inpatient rooms apply. Pre-op nurse will coordinate an appropriate time for 1 support person to accompany patient in pre-op.  This support person may not rotate.    Please refer to the [facility] website for the visitor guidelines for Inpatients (after your surgery is over and you are in a regular room).       Your procedure is scheduled on: 03-01-24   Report to [facility] Main Entrance    Report to admitting at      0615  AM   Call this number if you have problems the morning of surgery [phone]   Do not eat food :After Midnight.   After Midnight you may have the following liquids until _0545_____ AM/  DAY OF SURGERY  THEN NOTHING BY MOUTH  Water Non-Citrus Juices (without pulp, NO RED-Apple, White grape, White cranberry) Black Coffee (NO MILK/CREAM OR CREAMERS, sugar ok)  Clear Tea (NO MILK/CREAM OR CREAMERS, sugar ok) regular and decaf                             Plain Jell-O (NO RED)                                           Fruit ices (not with fruit pulp, NO RED)                                     Popsicles (NO RED)                                                               Sports drinks like Gatorade (NO RED)                      The day of surgery:  Drink ONE (1) Pre-Surgery G2 BY   0615  AM the morning of surgery. Drink in one sitting. Do not sip.  This drink was given to you during your hospital  pre-op appointment visit. Nothing else to drink after completing the  Pre-Surgery G2.          If you have  questions, please contact your surgeon's office.   FOLLOW ANY ADDITIONAL PRE OP INSTRUCTIONS YOU RECEIVED FROM YOUR SURGEON'S OFFICE!!!     Oral Hygiene is also important to reduce your risk of infection.                                    Remember - BRUSH YOUR TEETH THE MORNING OF SURGERY WITH  YOUR REGULAR TOOTHPASTE  DENTURES WILL BE REMOVED PRIOR TO SURGERY PLEASE DO NOT APPLY Poly grip OR ADHESIVES!!!   Do NOT smoke after Midnight   Stop all vitamins and herbal supplements 7 days before surgery.   Take these medicines the morning of surgery with A SIP OF WATER: xanax  if needed, zoloft, sstradiol    Bring CPAP mask and tubing day of surgery.                              You may not have any metal on your body including hair pins, jewelry, and body piercing             Do not wear make-up, lotions, powders, perfumes/cologne, or deodorant  Do not wear nail polish including gel and S&S, artificial/acrylic nails, or any other type of covering on natural nails including finger and toenails. If you have artificial nails, gel coating, etc. that needs to be removed by a nail salon please have this removed prior to surgery or surgery may need to be canceled/ delayed if the surgeon/ anesthesia feels like they are unable to be safely monitored.   Do not shave  5 days  prior to surgery.             Do not bring valuables to the hospital. [facility] IS NOT             RESPONSIBLE   FOR VALUABLES.   Contacts, glasses, dentures or bridgework may not be worn into surgery.   Bring small overnight bag day of surgery.   DO NOT BRING YOUR HOME MEDICATIONS TO THE HOSPITAL. PHARMACY WILL DISPENSE MEDICATIONS LISTED ON YOUR MEDICATION LIST TO YOU DURING YOUR ADMISSION IN THE HOSPITAL!    Patients discharged on the day of surgery will not be allowed to drive home.  Someone NEEDS to stay with you for the first 24 hours after anesthesia.   Special Instructions: Bring a copy of your healthcare  power of attorney and living will documents the day of surgery if you haven't scanned them before.              Please read over the following fact sheets you were given: IF YOU HAVE QUESTIONS ABOUT YOUR PRE-OP INSTRUCTIONS PLEASE CALL [phone].    If you test positive for Covid or have been in contact with anyone that has tested positive in the last 10 days please notify you surgeon.      Pre-operative 5 CHG Bath Instructions   You can play a key role in reducing the risk of infection after surgery. Your skin needs to be as free of germs as possible. You can reduce the number of germs on your skin by washing with CHG (chlorhexidine  gluconate) soap before surgery. CHG is an antiseptic soap that kills germs and continues to kill germs even after washing.   DO NOT use if you have an allergy to chlorhexidine /CHG or antibacterial soaps. If your skin becomes reddened or irritated, stop using the CHG and notify one of our RNs at [phone].   Please shower with the CHG soap starting 4 days before surgery using the following schedule:     Please keep in mind the following:  DO NOT shave, including legs and underarms, starting the day of your first shower.   You may shave your face at any point before/day of surgery.  Place clean sheets on your bed the day you start  using CHG soap. Use a clean washcloth (not used since being washed) for each shower. DO NOT sleep with pets once you start using the CHG.   CHG Shower Instructions:  If you choose to wash your hair and private area, wash first with your normal shampoo/soap.  After you use shampoo/soap, rinse your hair and body thoroughly to remove shampoo/soap residue.  Turn the water OFF and apply about 3 tablespoons (45 ml) of CHG soap to a CLEAN washcloth.  Apply CHG soap ONLY FROM YOUR NECK DOWN TO YOUR TOES (washing for 3-5 minutes)  DO NOT use CHG soap on face, private areas, open wounds, or sores.  Pay special attention to the area where  your surgery is being performed.  If you are having back surgery, having someone wash your back for you may be helpful. Wait 2 minutes after CHG soap is applied, then you may rinse off the CHG soap.  Pat dry with a clean towel  Put on clean clothes/pajamas   If you choose to wear lotion, please use ONLY the CHG-compatible lotions on the back of this paper.     Additional instructions for the day of surgery: DO NOT APPLY any lotions, deodorants, cologne, or perfumes.   Put on clean/comfortable clothes.  Brush your teeth.  Ask your nurse before applying any prescription medications to the skin.      CHG Compatible Lotions   Aveeno Moisturizing lotion  Cetaphil Moisturizing Cream  Cetaphil Moisturizing Lotion  Clairol Herbal Essence Moisturizing Lotion, Dry Skin  Clairol Herbal Essence Moisturizing Lotion, Extra Dry Skin  Clairol Herbal Essence Moisturizing Lotion, Normal Skin  Curel Age Defying Therapeutic Moisturizing Lotion with Alpha Hydroxy  Curel Extreme Care Body Lotion  Curel Soothing Hands Moisturizing Hand Lotion  Curel Therapeutic Moisturizing Cream, Fragrance-Free  Curel Therapeutic Moisturizing Lotion, Fragrance-Free  Curel Therapeutic Moisturizing Lotion, Original Formula  Eucerin Daily Replenishing Lotion  Eucerin Dry Skin Therapy Plus Alpha Hydroxy Crme  Eucerin Dry Skin Therapy Plus Alpha Hydroxy Lotion  Eucerin Original Crme  Eucerin Original Lotion  Eucerin Plus Crme Eucerin Plus Lotion  Eucerin TriLipid Replenishing Lotion  Keri Anti-Bacterial Hand Lotion  Keri Deep Conditioning Original Lotion Dry Skin Formula Softly Scented  Keri Deep Conditioning Original Lotion, Fragrance Free Sensitive Skin Formula  Keri Lotion Fast Absorbing Fragrance Free Sensitive Skin Formula  Keri Lotion Fast Absorbing Softly Scented Dry Skin Formula  Keri Original Lotion  Keri Skin Renewal Lotion Keri Silky Smooth Lotion  Keri Silky Smooth Sensitive Skin Lotion  Nivea Body  Creamy Conditioning Oil  Nivea Body Extra Enriched Lotion  Nivea Body Original Lotion  Nivea Body Sheer Moisturizing Lotion Nivea Crme  Nivea Skin Firming Lotion  NutraDerm 30 Skin Lotion  NutraDerm Skin Lotion  NutraDerm Therapeutic Skin Cream  NutraDerm Therapeutic Skin Lotion  ProShield Protective Hand Cream   Incentive Spirometer  An incentive spirometer is a tool that can help keep your lungs clear and active. This tool measures how well you are filling your lungs with each breath. Taking long deep breaths may help reverse or decrease the chance of developing breathing (pulmonary) problems (especially infection) following: A long period of time when you are unable to move or be active. BEFORE THE PROCEDURE  If the spirometer includes an indicator to show your best effort, your nurse or respiratory therapist will set it to a desired goal. If possible, sit up straight or lean slightly forward. Try not to slouch. Hold the incentive spirometer in an upright position.  INSTRUCTIONS FOR USE  Sit on the edge of your bed if possible, or sit up as far as you can in bed or on a chair. Hold the incentive spirometer in an upright position. Breathe out normally. Place the mouthpiece in your mouth and seal your lips tightly around it. Breathe in slowly and as deeply as possible, raising the piston or the ball toward the top of the column. Hold your breath for 3-5 seconds or for as long as possible. Allow the piston or ball to fall to the bottom of the column. Remove the mouthpiece from your mouth and breathe out normally. Rest for a few seconds and repeat Steps 1 through 7 at least 10 times every 1-2 hours when you are awake. Take your time and take a few normal breaths between deep breaths. The spirometer may include an indicator to show your best effort. Use the indicator as a goal to work toward during each repetition. After each set of 10 deep breaths, practice coughing to be sure your lungs  are clear. If you have an incision (the cut made at the time of surgery), support your incision when coughing by placing a pillow or rolled up towels firmly against it. Once you are able to get out of bed, walk around indoors and cough well. You may stop using the incentive spirometer when instructed by your caregiver.  RISKS AND COMPLICATIONS Take your time so you do not get dizzy or light-headed. If you are in pain, you may need to take or ask for pain medication before doing incentive spirometry. It is harder to take a deep breath if you are having pain. AFTER USE Rest and breathe slowly and easily. It can be helpful to keep track of a log of your progress. Your caregiver can provide you with a simple table to help with this. If you are using the spirometer at home, follow these instructions: SEEK MEDICAL CARE IF:  You are having difficultly using the spirometer. You have trouble using the spirometer as often as instructed. Your pain medication is not giving enough relief while using the spirometer. You develop fever of 100.5 F (38.1 C) or higher. SEEK IMMEDIATE MEDICAL CARE IF:  You cough up bloody sputum that had not been present before. You develop fever of 102 F (38.9 C) or greater. You develop worsening pain at or near the incision site. MAKE SURE YOU:  Understand these instructions. Will watch your condition. Will get help right away if you are not doing well or get worse. Document Released: 09/22/2006 Document Revised: 08/04/2011 Document Reviewed: 11/23/2006 [facility] Patient Information 2014 [place], [place].   ________________________________________________________________________

## 2024-02-24 ENCOUNTER — Encounter (HOSPITAL_COMMUNITY)
Admission: RE | Admit: 2024-02-24 | Discharge: 2024-02-24 | Disposition: A | Discharge status: Home / Self Care | Source: Ambulatory Visit | Attending: Orthopedic Surgery | Admitting: Orthopedic Surgery

## 2024-02-24 ENCOUNTER — Encounter (HOSPITAL_COMMUNITY): Payer: Self-pay

## 2024-02-24 ENCOUNTER — Other Ambulatory Visit: Payer: Self-pay

## 2024-02-24 VITALS — BP 136/86 | HR 107 | Temp 98.5°F | Resp 16 | Ht 64.0 in | Wt 153.0 lb

## 2024-02-24 DIAGNOSIS — Z01812 Encounter for preprocedural laboratory examination: Secondary | ICD-10-CM | POA: Diagnosis present

## 2024-02-24 DIAGNOSIS — Z01818 Encounter for other preprocedural examination: Secondary | ICD-10-CM

## 2024-02-24 HISTORY — DX: Prediabetes: R73.03

## 2024-02-24 LAB — CBC
HCT: 46.1 % — ABNORMAL HIGH (ref 36.0–46.0)
Hemoglobin: 14.7 g/dL (ref 12.0–15.0)
MCH: 30.1 pg (ref 26.0–34.0)
MCHC: 31.9 g/dL (ref 30.0–36.0)
MCV: 94.3 fL (ref 80.0–100.0)
Platelets: 297 K/uL (ref 150–400)
RBC: 4.89 MIL/uL (ref 3.87–5.11)
RDW: 13.5 % (ref 11.5–15.5)
WBC: 9.4 K/uL (ref 4.0–10.5)
nRBC: 0 % (ref 0.0–0.2)

## 2024-02-24 LAB — SURGICAL PCR SCREEN
MRSA, PCR: NEGATIVE
Staphylococcus aureus: NEGATIVE

## 2024-03-01 ENCOUNTER — Other Ambulatory Visit: Payer: Self-pay

## 2024-03-01 ENCOUNTER — Encounter (HOSPITAL_COMMUNITY)
Admission: AD | Disposition: A | Discharge status: Home / Self Care | Payer: Self-pay | Source: Home / Self Care | Attending: Orthopedic Surgery

## 2024-03-01 ENCOUNTER — Ambulatory Visit (HOSPITAL_COMMUNITY): Payer: Self-pay | Admitting: Medical

## 2024-03-01 ENCOUNTER — Observation Stay (HOSPITAL_COMMUNITY)

## 2024-03-01 ENCOUNTER — Inpatient Hospital Stay (HOSPITAL_COMMUNITY)
Admission: AD | Admit: 2024-03-01 | Discharge: 2024-03-04 | DRG: 470 | Disposition: A | Discharge status: Home / Self Care | Attending: Orthopedic Surgery | Admitting: Orthopedic Surgery

## 2024-03-01 ENCOUNTER — Encounter (HOSPITAL_COMMUNITY): Payer: Self-pay | Admitting: Orthopedic Surgery

## 2024-03-01 ENCOUNTER — Ambulatory Visit (HOSPITAL_COMMUNITY): Payer: Self-pay | Admitting: Anesthesiology

## 2024-03-01 DIAGNOSIS — Z888 Allergy status to other drugs, medicaments and biological substances status: Secondary | ICD-10-CM

## 2024-03-01 DIAGNOSIS — M87052 Idiopathic aseptic necrosis of left femur: Secondary | ICD-10-CM

## 2024-03-01 DIAGNOSIS — Z96651 Presence of right artificial knee joint: Principal | ICD-10-CM

## 2024-03-01 DIAGNOSIS — M87051 Idiopathic aseptic necrosis of right femur: Secondary | ICD-10-CM | POA: Diagnosis present

## 2024-03-01 DIAGNOSIS — Z23 Encounter for immunization: Secondary | ICD-10-CM

## 2024-03-01 DIAGNOSIS — Z79899 Other long term (current) drug therapy: Secondary | ICD-10-CM

## 2024-03-01 DIAGNOSIS — R7303 Prediabetes: Secondary | ICD-10-CM | POA: Diagnosis present

## 2024-03-01 DIAGNOSIS — F17210 Nicotine dependence, cigarettes, uncomplicated: Secondary | ICD-10-CM | POA: Diagnosis present

## 2024-03-01 DIAGNOSIS — Z8249 Family history of ischemic heart disease and other diseases of the circulatory system: Secondary | ICD-10-CM

## 2024-03-01 DIAGNOSIS — F32A Depression, unspecified: Secondary | ICD-10-CM | POA: Diagnosis present

## 2024-03-01 DIAGNOSIS — M879 Osteonecrosis, unspecified: Secondary | ICD-10-CM | POA: Diagnosis present

## 2024-03-01 DIAGNOSIS — Z91018 Allergy to other foods: Secondary | ICD-10-CM

## 2024-03-01 DIAGNOSIS — M1711 Unilateral primary osteoarthritis, right knee: Secondary | ICD-10-CM

## 2024-03-01 DIAGNOSIS — Z9079 Acquired absence of other genital organ(s): Secondary | ICD-10-CM

## 2024-03-01 DIAGNOSIS — Z96652 Presence of left artificial knee joint: Secondary | ICD-10-CM | POA: Diagnosis present

## 2024-03-01 DIAGNOSIS — Z90722 Acquired absence of ovaries, bilateral: Secondary | ICD-10-CM

## 2024-03-01 DIAGNOSIS — F17290 Nicotine dependence, other tobacco product, uncomplicated: Secondary | ICD-10-CM | POA: Diagnosis present

## 2024-03-01 DIAGNOSIS — F419 Anxiety disorder, unspecified: Secondary | ICD-10-CM | POA: Diagnosis present

## 2024-03-01 DIAGNOSIS — G8929 Other chronic pain: Secondary | ICD-10-CM | POA: Diagnosis present

## 2024-03-01 DIAGNOSIS — Z90710 Acquired absence of both cervix and uterus: Secondary | ICD-10-CM

## 2024-03-01 HISTORY — PX: TOTAL KNEE ARTHROPLASTY: SHX125

## 2024-03-01 SURGERY — ARTHROPLASTY, KNEE, TOTAL
Anesthesia: Spinal | Site: Knee | Laterality: Right

## 2024-03-01 MED ORDER — DEXAMETHASONE SODIUM PHOSPHATE 10 MG/ML IJ SOLN
8.0000 mg | Freq: Once | INTRAMUSCULAR | Status: DC
Start: 2024-03-01 — End: 2024-03-01

## 2024-03-01 MED ORDER — ESTRADIOL 0.5 MG PO TABS
2.0000 mg | ORAL_TABLET | Freq: Every day | ORAL | Status: DC
  Administered 2024-03-02 – 2024-03-04 (×3): 2 mg via ORAL
  Filled 2024-03-01 (×3): qty 4

## 2024-03-01 MED ORDER — SODIUM CHLORIDE 0.9 % IR SOLN
Status: DC | PRN
  Administered 2024-03-01: 1000 mL

## 2024-03-01 MED ORDER — ORAL CARE MOUTH RINSE: 15.0000 mL | Freq: Once | OROMUCOSAL | Status: AC

## 2024-03-01 MED ORDER — HYDROMORPHONE HCL 1 MG/ML IJ SOLN
0.5000 mg | INTRAMUSCULAR | Status: DC | PRN
  Administered 2024-03-01 – 2024-03-04 (×5): 1 mg via INTRAVENOUS
  Filled 2024-03-01 (×6): qty 1

## 2024-03-01 MED ORDER — OXYCODONE-ACETAMINOPHEN 5-325 MG PO TABS
1.0000 | ORAL_TABLET | Freq: Every day | ORAL | Status: DC
  Administered 2024-03-01 – 2024-03-04 (×15): 1 via ORAL
  Filled 2024-03-01 (×17): qty 1

## 2024-03-01 MED ORDER — CHLORHEXIDINE GLUCONATE 0.12 % MT SOLN
15.0000 mL | Freq: Once | OROMUCOSAL | Status: AC
  Administered 2024-03-01: 15 mL via OROMUCOSAL

## 2024-03-01 MED ORDER — PROPOFOL 10 MG/ML IV BOLUS
INTRAVENOUS | Status: DC | PRN
Start: 2024-03-01 — End: 2024-03-01
  Administered 2024-03-01: 30 mg via INTRAVENOUS
  Administered 2024-03-01 (×2): 20 mg via INTRAVENOUS

## 2024-03-01 MED ORDER — ALUM & MAG HYDROXIDE-SIMETH 200-200-20 MG/5ML PO SUSP: 30.0000 mL | ORAL | Status: DC | PRN

## 2024-03-01 MED ORDER — METHOCARBAMOL 500 MG PO TABS
ORAL_TABLET | ORAL | Status: AC
  Filled 2024-03-01: qty 1

## 2024-03-01 MED ORDER — MAGNESIUM CITRATE PO SOLN: 1.0000 | Freq: Once | ORAL | Status: DC | PRN

## 2024-03-01 MED ORDER — BISACODYL 10 MG RE SUPP: 10.0000 mg | Freq: Every day | RECTAL | Status: DC | PRN

## 2024-03-01 MED ORDER — POVIDONE-IODINE 10 % EX SWAB: 2.0000 | Freq: Once | CUTANEOUS | Status: DC

## 2024-03-01 MED ORDER — ONDANSETRON HCL 4 MG/2ML IJ SOLN
INTRAMUSCULAR | Status: DC | PRN
  Administered 2024-03-01: 4 mg via INTRAVENOUS

## 2024-03-01 MED ORDER — BUPIVACAINE-EPINEPHRINE (PF) 0.25% -1:200000 IJ SOLN
INTRAMUSCULAR | Status: AC
Start: 2024-03-01 — End: 2024-03-01
  Filled 2024-03-01: qty 30

## 2024-03-01 MED ORDER — PHENOL 1.4 % MT LIQD: 1.0000 | OROMUCOSAL | Status: DC | PRN

## 2024-03-01 MED ORDER — ACETAMINOPHEN 325 MG PO TABS: 325.0000 mg | ORAL_TABLET | Freq: Four times a day (QID) | ORAL | Status: DC | PRN

## 2024-03-01 MED ORDER — PROPOFOL 1000 MG/100ML IV EMUL
INTRAVENOUS | Status: AC
  Filled 2024-03-01: qty 100

## 2024-03-01 MED ORDER — SERTRALINE HCL 50 MG PO TABS
150.0000 mg | ORAL_TABLET | Freq: Every day | ORAL | Status: DC
  Administered 2024-03-01 – 2024-03-04 (×4): 150 mg via ORAL
  Filled 2024-03-01 (×4): qty 1

## 2024-03-01 MED ORDER — OXYCODONE HCL 5 MG/5ML PO SOLN: 5.0000 mg | Freq: Once | ORAL | Status: DC | PRN

## 2024-03-01 MED ORDER — OXYCODONE HCL 5 MG PO TABS
ORAL_TABLET | ORAL | Status: AC
  Filled 2024-03-01: qty 2

## 2024-03-01 MED ORDER — ONDANSETRON HCL 4 MG/2ML IJ SOLN
INTRAMUSCULAR | Status: AC
  Filled 2024-03-01: qty 2

## 2024-03-01 MED ORDER — BUPIVACAINE-EPINEPHRINE (PF) 0.5% -1:200000 IJ SOLN
INTRAMUSCULAR | Status: DC | PRN
Start: 2024-03-01 — End: 2024-03-01
  Administered 2024-03-01: 15 mL

## 2024-03-01 MED ORDER — METOCLOPRAMIDE HCL 5 MG/ML IJ SOLN
INTRAMUSCULAR | Status: AC
  Filled 2024-03-01: qty 2

## 2024-03-01 MED ORDER — FENTANYL CITRATE PF 50 MCG/ML IJ SOSY
25.0000 ug | PREFILLED_SYRINGE | INTRAMUSCULAR | Status: DC | PRN
  Administered 2024-03-01 (×3): 50 ug via INTRAVENOUS

## 2024-03-01 MED ORDER — CEFAZOLIN SODIUM-DEXTROSE 2-4 GM/100ML-% IV SOLN
2.0000 g | Freq: Four times a day (QID) | INTRAVENOUS | Status: AC
  Administered 2024-03-01 (×2): 2 g via INTRAVENOUS
  Filled 2024-03-01 (×2): qty 100

## 2024-03-01 MED ORDER — ALBUTEROL SULFATE HFA 108 (90 BASE) MCG/ACT IN AERS: 1.0000 | INHALATION_SPRAY | Freq: Four times a day (QID) | RESPIRATORY_TRACT | Status: DC | PRN

## 2024-03-01 MED ORDER — METOCLOPRAMIDE HCL 5 MG/ML IJ SOLN
5.0000 mg | Freq: Three times a day (TID) | INTRAMUSCULAR | Status: DC | PRN
  Administered 2024-03-01: 10 mg via INTRAVENOUS

## 2024-03-01 MED ORDER — MENTHOL 3 MG MT LOZG: 1.0000 | LOZENGE | OROMUCOSAL | Status: DC | PRN

## 2024-03-01 MED ORDER — HYDROMORPHONE HCL 1 MG/ML IJ SOLN
INTRAMUSCULAR | Status: AC
  Filled 2024-03-01: qty 1

## 2024-03-01 MED ORDER — HYDROMORPHONE HCL 2 MG PO TABS
2.0000 mg | ORAL_TABLET | ORAL | Status: DC | PRN
  Administered 2024-03-01 – 2024-03-03 (×6): 2 mg via ORAL
  Filled 2024-03-01 (×7): qty 1

## 2024-03-01 MED ORDER — DOCUSATE SODIUM 100 MG PO CAPS
100.0000 mg | ORAL_CAPSULE | Freq: Two times a day (BID) | ORAL | Status: DC
  Administered 2024-03-01 – 2024-03-04 (×6): 100 mg via ORAL
  Filled 2024-03-01 (×6): qty 1

## 2024-03-01 MED ORDER — MIDAZOLAM HCL 2 MG/2ML IJ SOLN
INTRAMUSCULAR | Status: AC
  Filled 2024-03-01: qty 2

## 2024-03-01 MED ORDER — FENTANYL CITRATE PF 50 MCG/ML IJ SOSY
100.0000 ug | PREFILLED_SYRINGE | INTRAMUSCULAR | Status: DC
  Administered 2024-03-01: 50 ug via INTRAVENOUS
  Filled 2024-03-01: qty 2

## 2024-03-01 MED ORDER — POLYETHYLENE GLYCOL 3350 17 G PO PACK
17.0000 g | PACK | Freq: Every day | ORAL | Status: DC | PRN
  Filled 2024-03-01: qty 1

## 2024-03-01 MED ORDER — METHOCARBAMOL 1000 MG/10ML IJ SOLN: 500.0000 mg | Freq: Four times a day (QID) | INTRAMUSCULAR | Status: DC | PRN

## 2024-03-01 MED ORDER — 0.9 % SODIUM CHLORIDE (POUR BTL) OPTIME
TOPICAL | Status: DC | PRN
  Administered 2024-03-01: 1000 mL

## 2024-03-01 MED ORDER — DEXMEDETOMIDINE HCL IN NACL 80 MCG/20ML IV SOLN
INTRAVENOUS | Status: DC | PRN
  Administered 2024-03-01: 12 ug via INTRAVENOUS

## 2024-03-01 MED ORDER — PROPOFOL 10 MG/ML IV BOLUS
INTRAVENOUS | Status: AC
  Filled 2024-03-01: qty 20

## 2024-03-01 MED ORDER — PNEUMOCOCCAL 20-VAL CONJ VACC 0.5 ML IM SUSY
0.5000 mL | PREFILLED_SYRINGE | INTRAMUSCULAR | Status: AC
  Administered 2024-03-03: 0.5 mL via INTRAMUSCULAR
  Filled 2024-03-01: qty 0.5

## 2024-03-01 MED ORDER — HYDROXYZINE HCL 10 MG PO TABS: 10.0000 mg | ORAL_TABLET | Freq: Three times a day (TID) | ORAL | Status: DC | PRN

## 2024-03-01 MED ORDER — PROPOFOL 500 MG/50ML IV EMUL
INTRAVENOUS | Status: DC | PRN
  Administered 2024-03-01: 50 ug/kg/min via INTRAVENOUS

## 2024-03-01 MED ORDER — ACETAMINOPHEN 500 MG PO TABS
1000.0000 mg | ORAL_TABLET | Freq: Once | ORAL | Status: AC
  Administered 2024-03-01: 1000 mg via ORAL
  Filled 2024-03-01: qty 2

## 2024-03-01 MED ORDER — FENTANYL CITRATE PF 50 MCG/ML IJ SOSY
PREFILLED_SYRINGE | INTRAMUSCULAR | Status: AC
  Filled 2024-03-01: qty 1

## 2024-03-01 MED ORDER — LIDOCAINE HCL (PF) 2 % IJ SOLN
INTRAMUSCULAR | Status: AC
  Filled 2024-03-01: qty 5

## 2024-03-01 MED ORDER — SODIUM CHLORIDE 0.9 % IV SOLN
INTRAVENOUS | Status: DC | PRN
  Administered 2024-03-01: 80 mL

## 2024-03-01 MED ORDER — POVIDONE-IODINE 10 % EX SWAB
2.0000 | Freq: Once | CUTANEOUS | Status: DC
Start: 2024-03-01 — End: 2024-03-01

## 2024-03-01 MED ORDER — OXYCODONE HCL 5 MG PO TABS
5.0000 mg | ORAL_TABLET | ORAL | Status: DC | PRN
  Administered 2024-03-01 – 2024-03-02 (×4): 10 mg via ORAL
  Administered 2024-03-02: 5 mg via ORAL
  Administered 2024-03-03 (×2): 10 mg via ORAL
  Filled 2024-03-01 (×3): qty 2
  Filled 2024-03-01: qty 1
  Filled 2024-03-01 (×2): qty 2

## 2024-03-01 MED ORDER — ONDANSETRON HCL 4 MG PO TABS: 4.0000 mg | ORAL_TABLET | Freq: Four times a day (QID) | ORAL | Status: DC | PRN

## 2024-03-01 MED ORDER — INFLUENZA VIRUS VACC SPLIT PF (FLUZONE) 0.5 ML IM SUSY
0.5000 mL | PREFILLED_SYRINGE | INTRAMUSCULAR | Status: AC
  Administered 2024-03-03: 0.5 mL via INTRAMUSCULAR
  Filled 2024-03-01: qty 0.5

## 2024-03-01 MED ORDER — LIDOCAINE HCL (CARDIAC) PF 100 MG/5ML IV SOSY
PREFILLED_SYRINGE | INTRAVENOUS | Status: DC | PRN
  Administered 2024-03-01: 100 mg via INTRAVENOUS

## 2024-03-01 MED ORDER — MIDAZOLAM HCL 2 MG/2ML IJ SOLN
INTRAMUSCULAR | Status: DC | PRN
  Administered 2024-03-01: 2 mg via INTRAVENOUS

## 2024-03-01 MED ORDER — METHOCARBAMOL 500 MG PO TABS
500.0000 mg | ORAL_TABLET | Freq: Four times a day (QID) | ORAL | Status: DC | PRN
Start: 2024-03-01 — End: 2024-03-04
  Administered 2024-03-01 – 2024-03-04 (×9): 500 mg via ORAL
  Filled 2024-03-01 (×9): qty 1

## 2024-03-01 MED ORDER — DEXAMETHASONE SODIUM PHOSPHATE 10 MG/ML IJ SOLN
INTRAMUSCULAR | Status: DC | PRN
  Administered 2024-03-01: 10 mg via INTRAVENOUS

## 2024-03-01 MED ORDER — SODIUM CHLORIDE (PF) 0.9 % IJ SOLN
INTRAMUSCULAR | Status: AC
  Filled 2024-03-01: qty 50

## 2024-03-01 MED ORDER — DEXMEDETOMIDINE HCL IN NACL 80 MCG/20ML IV SOLN
INTRAVENOUS | Status: AC
  Filled 2024-03-01: qty 20

## 2024-03-01 MED ORDER — ACETAMINOPHEN 10 MG/ML IV SOLN: 1000.0000 mg | Freq: Once | INTRAVENOUS | Status: DC | PRN

## 2024-03-01 MED ORDER — DEXAMETHASONE SODIUM PHOSPHATE 10 MG/ML IJ SOLN
INTRAMUSCULAR | Status: AC
  Filled 2024-03-01: qty 1

## 2024-03-01 MED ORDER — CEFAZOLIN SODIUM-DEXTROSE 2-4 GM/100ML-% IV SOLN
2.0000 g | INTRAVENOUS | Status: AC
  Administered 2024-03-01: 2 g via INTRAVENOUS
  Filled 2024-03-01: qty 100

## 2024-03-01 MED ORDER — ONDANSETRON HCL 4 MG/2ML IJ SOLN: 4.0000 mg | Freq: Four times a day (QID) | INTRAMUSCULAR | Status: DC | PRN

## 2024-03-01 MED ORDER — METOCLOPRAMIDE HCL 5 MG PO TABS: 5.0000 mg | ORAL_TABLET | Freq: Three times a day (TID) | ORAL | Status: DC | PRN

## 2024-03-01 MED ORDER — ONDANSETRON HCL 4 MG/2ML IJ SOLN: 4.0000 mg | Freq: Once | INTRAMUSCULAR | Status: DC | PRN

## 2024-03-01 MED ORDER — ALBUTEROL SULFATE (2.5 MG/3ML) 0.083% IN NEBU: 2.5000 mg | INHALATION_SOLUTION | Freq: Four times a day (QID) | RESPIRATORY_TRACT | Status: DC | PRN

## 2024-03-01 MED ORDER — BUPIVACAINE LIPOSOME 1.3 % IJ SUSP: 20.0000 mL | Freq: Once | INTRAMUSCULAR | Status: DC

## 2024-03-01 MED ORDER — MIDAZOLAM HCL 2 MG/2ML IJ SOLN
2.0000 mg | INTRAMUSCULAR | Status: DC
  Administered 2024-03-01: 2 mg via INTRAVENOUS
  Filled 2024-03-01: qty 2

## 2024-03-01 MED ORDER — BUPIVACAINE LIPOSOME 1.3 % IJ SUSP
INTRAMUSCULAR | Status: AC
  Filled 2024-03-01: qty 20

## 2024-03-01 MED ORDER — PHENYLEPHRINE 80 MCG/ML (10ML) SYRINGE FOR IV PUSH (FOR BLOOD PRESSURE SUPPORT)
PREFILLED_SYRINGE | INTRAVENOUS | Status: DC | PRN
  Administered 2024-03-01: 80 ug via INTRAVENOUS
  Administered 2024-03-01: 160 ug via INTRAVENOUS
  Administered 2024-03-01 (×2): 80 ug via INTRAVENOUS

## 2024-03-01 MED ORDER — SUVOREXANT 20 MG PO TABS: 20.0000 mg | ORAL_TABLET | Freq: Every day | ORAL | Status: DC

## 2024-03-01 MED ORDER — DEXAMETHASONE SODIUM PHOSPHATE 10 MG/ML IJ SOLN
10.0000 mg | Freq: Once | INTRAMUSCULAR | Status: AC
  Administered 2024-03-02: 10 mg via INTRAVENOUS
  Filled 2024-03-01: qty 1

## 2024-03-01 MED ORDER — NICOTINE 7 MG/24HR TD PT24
7.0000 mg | MEDICATED_PATCH | Freq: Every day | TRANSDERMAL | Status: DC
  Administered 2024-03-01 – 2024-03-04 (×4): 7 mg via TRANSDERMAL
  Filled 2024-03-01 (×4): qty 1

## 2024-03-01 MED ORDER — ASPIRIN 81 MG PO CHEW
81.0000 mg | CHEWABLE_TABLET | Freq: Two times a day (BID) | ORAL | Status: DC
  Administered 2024-03-01 – 2024-03-04 (×6): 81 mg via ORAL
  Filled 2024-03-01 (×6): qty 1

## 2024-03-01 MED ORDER — ALPRAZOLAM 0.5 MG PO TABS
2.0000 mg | ORAL_TABLET | Freq: Three times a day (TID) | ORAL | Status: DC | PRN
  Administered 2024-03-01: 1 mg via ORAL
  Administered 2024-03-02 (×2): 2 mg via ORAL
  Administered 2024-03-02: 1 mg via ORAL
  Administered 2024-03-03 – 2024-03-04 (×4): 2 mg via ORAL
  Filled 2024-03-01 (×8): qty 4

## 2024-03-01 MED ORDER — ACETAMINOPHEN 500 MG PO TABS
1000.0000 mg | ORAL_TABLET | Freq: Four times a day (QID) | ORAL | Status: AC
  Administered 2024-03-02: 500 mg via ORAL
  Administered 2024-03-02: 1000 mg via ORAL
  Filled 2024-03-01 (×2): qty 2

## 2024-03-01 MED ORDER — TRANEXAMIC ACID-NACL 1000-0.7 MG/100ML-% IV SOLN
1000.0000 mg | Freq: Once | INTRAVENOUS | Status: AC
  Administered 2024-03-01: 1000 mg via INTRAVENOUS
  Filled 2024-03-01: qty 100

## 2024-03-01 MED ORDER — LACTATED RINGERS IV SOLN: INTRAVENOUS | Status: DC

## 2024-03-01 MED ORDER — BUPIVACAINE IN DEXTROSE 0.75-8.25 % IT SOLN
INTRATHECAL | Status: DC | PRN
Start: 2024-03-01 — End: 2024-03-01
  Administered 2024-03-01: 1.8 mL via INTRATHECAL

## 2024-03-01 MED ORDER — TRANEXAMIC ACID-NACL 1000-0.7 MG/100ML-% IV SOLN
1000.0000 mg | INTRAVENOUS | Status: AC
  Administered 2024-03-01: 1000 mg via INTRAVENOUS
  Filled 2024-03-01: qty 100

## 2024-03-01 MED ORDER — PANTOPRAZOLE SODIUM 40 MG PO TBEC
40.0000 mg | DELAYED_RELEASE_TABLET | Freq: Every day | ORAL | Status: DC
  Administered 2024-03-01 – 2024-03-04 (×4): 40 mg via ORAL
  Filled 2024-03-01 (×4): qty 1

## 2024-03-01 MED ORDER — OXYCODONE HCL 5 MG PO TABS: 5.0000 mg | ORAL_TABLET | Freq: Once | ORAL | Status: DC | PRN

## 2024-03-01 MED ORDER — ZOLPIDEM TARTRATE 5 MG PO TABS: 5.0000 mg | ORAL_TABLET | Freq: Every evening | ORAL | Status: DC | PRN

## 2024-03-01 MED ORDER — FENTANYL CITRATE PF 50 MCG/ML IJ SOSY
PREFILLED_SYRINGE | INTRAMUSCULAR | Status: AC
  Filled 2024-03-01: qty 2

## 2024-03-01 SURGICAL SUPPLY — 44 items
BAG COUNTER SPONGE SURGICOUNT (BAG) IMPLANT
BLADE SAG 18X100X1.27 (BLADE) ×1 IMPLANT
BLADE SAGITTAL 25.0X1.37X90 (BLADE) ×1 IMPLANT
BLADE SURG 15 STRL LF DISP TIS (BLADE) ×1 IMPLANT
BNDG ELASTIC 6X10 VLCR STRL LF (GAUZE/BANDAGES/DRESSINGS) ×1 IMPLANT
BOWL SMART MIX CTS (DISPOSABLE) IMPLANT
CLSR STERI-STRIP ANTIMIC 1/2X4 (GAUZE/BANDAGES/DRESSINGS) ×2 IMPLANT
COMPONENT FEMRL CMTL TRITH SZ2 (Joint) IMPLANT
COVER SURGICAL LIGHT HANDLE (MISCELLANEOUS) ×1 IMPLANT
CUFF TRNQT CYL 34X4.125X (TOURNIQUET CUFF) ×1 IMPLANT
DRAPE U-SHAPE 47X51 STRL (DRAPES) ×1 IMPLANT
DRSG MEPILEX POST OP 4X12 (GAUZE/BANDAGES/DRESSINGS) ×1 IMPLANT
DURAPREP 26ML APPLICATOR (WOUND CARE) ×2 IMPLANT
ELECT PENCIL ROCKER SW 15FT (MISCELLANEOUS) ×1 IMPLANT
ELECT REM PT RETURN 15FT ADLT (MISCELLANEOUS) ×1 IMPLANT
GLOVE BIO SURGEON STRL SZ7.5 (GLOVE) ×1 IMPLANT
GLOVE BIOGEL PI IND STRL 7.5 (GLOVE) ×1 IMPLANT
GLOVE BIOGEL PI IND STRL 8 (GLOVE) ×1 IMPLANT
GLOVE SURG SYN 7.0 PF PI (GLOVE) ×1 IMPLANT
GOWN STRL REUS W/ TWL LRG LVL3 (GOWN DISPOSABLE) ×1 IMPLANT
GOWN STRL REUS W/ TWL XL LVL3 (GOWN DISPOSABLE) ×1 IMPLANT
HOLDER FOLEY CATH W/STRAP (MISCELLANEOUS) IMPLANT
IMMOBILIZER KNEE 20 THIGH 36 (SOFTGOODS) IMPLANT
IMMOBILIZER KNEE 22 UNIV (SOFTGOODS) IMPLANT
INSERT TIB CS TRIATH X3 9 (Insert) IMPLANT
KIT TURNOVER KIT A (KITS) ×1 IMPLANT
KNEE PATELLA ASYMMETRIC 9X29 (Knees) IMPLANT
KNEE TIBIAL COMPONENT SZ3 (Knees) IMPLANT
MANIFOLD NEPTUNE II (INSTRUMENTS) ×1 IMPLANT
NS IRRIG 1000ML POUR BTL (IV SOLUTION) ×1 IMPLANT
PACK TOTAL KNEE CUSTOM (KITS) ×1 IMPLANT
PIN FLUTED HEDLESS FIX 3.5X1/8 (PIN) IMPLANT
PROTECTOR NERVE ULNAR (MISCELLANEOUS) ×1 IMPLANT
SET HNDPC FAN SPRY TIP SCT (DISPOSABLE) ×1 IMPLANT
SPIKE FLUID TRANSFER (MISCELLANEOUS) ×1 IMPLANT
SUT MNCRL AB 3-0 PS2 18 (SUTURE) ×1 IMPLANT
SUT VIC AB 0 CT1 36 (SUTURE) ×2 IMPLANT
SUT VIC AB 1 CT1 36 (SUTURE) ×1 IMPLANT
SUT VIC AB 2-0 CT1 TAPERPNT 27 (SUTURE) ×1 IMPLANT
TOWEL GREEN STERILE FF (TOWEL DISPOSABLE) ×1 IMPLANT
TRAY FOLEY MTR SLVR 14FR STAT (SET/KITS/TRAYS/PACK) IMPLANT
TRAY FOLEY MTR SLVR 16FR STAT (SET/KITS/TRAYS/PACK) IMPLANT
TUBE SUCTION HIGH CAP CLEAR NV (SUCTIONS) ×1 IMPLANT
WRAP KNEE MAXI GEL POST OP (GAUZE/BANDAGES/DRESSINGS) ×1 IMPLANT

## 2024-03-01 NOTE — Anesthesia Postprocedure Evaluation (Signed)
 Anesthesia Post Note  Patient: Diane Stein  Procedure(s) Performed: ARTHROPLASTY, KNEE, TOTAL (Right: Knee)     Patient location during evaluation: PACU Anesthesia Type: Spinal Level of consciousness: awake and alert Pain management: pain level controlled Vital Signs Assessment: post-procedure vital signs reviewed and stable Respiratory status: spontaneous breathing, nonlabored ventilation, respiratory function stable and patient connected to nasal cannula oxygen Cardiovascular status: blood pressure returned to baseline and stable Postop Assessment: no apparent nausea or vomiting Anesthetic complications: no   No notable events documented.  Last Vitals:  Vitals:   03/01/24 1400 03/01/24 1419  BP: 119/68 124/73  Pulse: 89 99  Resp: 16 16  Temp:  36.9 C  SpO2: 96% 98%    Last Pain:  Vitals:   03/01/24 1428  TempSrc:   PainSc: 7                  Diane Stein

## 2024-03-01 NOTE — Progress Notes (Signed)
 Orthopedic Tech Progress Note Patient Details:  Diane Stein 1985/10/05 [id number]  Ortho Devices Type of Ortho Device: Bone foam zero knee, CPM padding Ortho Device/Splint Interventions: Ordered, Application, Adjustment   Post Interventions Patient Tolerated: Well Instructions Provided: Adjustment of device, Care of device  Diane Stein 03/01/2024, 11:36 AM

## 2024-03-01 NOTE — Anesthesia Preprocedure Evaluation (Signed)
 Anesthesia Evaluation  Patient identified by MRN, date of birth, ID band Patient awake    Reviewed: Allergy & Precautions, NPO status , Patient's Chart, lab work & pertinent test results, reviewed documented beta blocker date and time   History of Anesthesia Complications Negative for: history of anesthetic complications  Airway Mallampati: II  TM Distance: >3 FB     Dental  (+) Edentulous Upper, Edentulous Lower   Pulmonary asthma , pneumonia, resolved, neg COPD, Current Smoker   breath sounds clear to auscultation       Cardiovascular (-) angina (-) CAD, (-) Past MI and (-) Cardiac Stents + dysrhythmias  Rhythm:Regular Rate:Normal     Neuro/Psych neg Seizures PSYCHIATRIC DISORDERS Anxiety Depression       GI/Hepatic ,,,(+) neg Cirrhosis        Endo/Other    Renal/GU Renal disease     Musculoskeletal  (+) Arthritis ,    Abdominal   Peds  Hematology   Anesthesia Other Findings   Reproductive/Obstetrics                              Anesthesia Physical Anesthesia Plan  ASA: 2  Anesthesia Plan: Spinal   Post-op Pain Management: Regional block*   Induction: Intravenous  PONV Risk Score and Plan: 2 and Ondansetron  and Propofol  infusion  Airway Management Planned: Natural Airway and Simple Face Mask  Additional Equipment:   Intra-op Plan:   Post-operative Plan:   Informed Consent: I have reviewed the patients History and Physical, chart, labs and discussed the procedure including the risks, benefits and alternatives for the proposed anesthesia with the patient or authorized representative who has indicated his/her understanding and acceptance.       Plan Discussed with: CRNA  Anesthesia Plan Comments:         Anesthesia Quick Evaluation

## 2024-03-01 NOTE — Anesthesia Procedure Notes (Signed)
 Date/Time: 03/01/2024 9:10 AM  Performed by: Therisa Doyal CROME, CRNAOxygen Delivery Method: Simple face mask

## 2024-03-01 NOTE — Discharge Instructions (Signed)

## 2024-03-01 NOTE — Anesthesia Procedure Notes (Signed)
 Anesthesia Regional Block: Adductor canal block   Pre-Anesthetic Checklist: , timeout performed,  Correct Patient, Correct Site, Correct Laterality,  Correct Procedure, Correct Position, site marked,  Risks and benefits discussed,  Surgical consent,  Pre-op evaluation,  At surgeon's request and post-op pain management  Laterality: Right  Prep: Maximum Sterile Barrier Precautions used, chloraprep       Needles:  Injection technique: Single-shot  Needle Type: Echogenic Needle      Needle Gauge: 20     Additional Needles:   Procedures:,,,, ultrasound used (permanent image in chart),,    Narrative:  Start time: 03/01/2024 8:09 AM End time: 03/01/2024 8:13 AM Injection made incrementally with aspirations every 5 mL.  Performed by: Personally  Anesthesiologist: Keneth Lynwood POUR, MD

## 2024-03-01 NOTE — Progress Notes (Signed)
 Orthopedic Tech Progress Note Patient Details:  Diane Stein Aug 08, 1985 [id number]  CPM Right Knee CPM Right Knee: Off Right Knee Flexion (Degrees): 60 Right Knee Extension (Degrees): 0  Post Interventions Patient Tolerated: Well Instructions Provided: Adjustment of device, Care of device  Waylan Thom Loving 03/01/2024, 2:45 PM

## 2024-03-01 NOTE — Op Note (Signed)
 DATE OF SURGERY:  03/01/2024 TIME: 10:25 AM  PATIENT NAME:  Diane Stein   AGE: 38 y.o.    PRE-OPERATIVE DIAGNOSIS:  OA RIGHT KNEE  POST-OPERATIVE DIAGNOSIS:  Same  PROCEDURE:  Procedure(s): ARTHROPLASTY, KNEE, TOTAL   SURGEON:  Evalene JONETTA Chancy, MD   ASSISTANT:  Gerard Large, PA-C, she was present and scrubbed throughout the case, critical for completion in a timely fashion, and for retraction, instrumentation, and closure.    OPERATIVE IMPLANTS: Stryker Triathlon CR. Press fit knee  Femur size 2, Tibia size 3, Patella size 29 3-peg oval button, with a 9 mm polyethylene insert.   PREOPERATIVE INDICATIONS:  [name] is a 38 y.o. year old female with end stage bone on bone degenerative arthritis of the knee who failed conservative treatment, including injections, antiinflammatories, activity modification, and assistive devices, and had significant impairment of their activities of daily living, and elected for Total Knee Arthroplasty.   The risks, benefits, and alternatives were discussed at length including but not limited to the risks of infection, bleeding, nerve injury, stiffness, blood clots, the need for revision surgery, cardiopulmonary complications, among others, and they were willing to proceed.   OPERATIVE DESCRIPTION:  The patient was brought to the operative room and placed in a supine position.  General anesthesia was administered.  IV antibiotics were given.  The lower extremity was prepped and draped in the usual sterile fashion.  Time out was performed.  The leg was elevated and exsanguinated and the tourniquet was inflated.  Anterior approach was performed.  The patella was everted and osteophytes were removed.  The anterior horn of the medial and lateral meniscus was removed.   The distal femur was opened with the drill and the intramedullary distal femoral cutting jig was utilized, set at 5 degrees resecting 8 mm off the distal femur.  Care  was taken to protect the collateral ligaments.  The distal femoral sizing jig was applied, taking care to avoid notching.  Then the 4-in-1 cutting jig was applied and the anterior and posterior femur was cut, along with the chamfer cuts.  All posterior osteophytes were removed.  The flexion gap was then measured and was symmetric with the extension gap.  Then the extramedullary tibial cutting jig was utilized making the appropriate cut using the anterior tibial crest as a reference building in appropriate posterior slope.  Care was taken during the cut to protect the medial and collateral ligaments.  The proximal tibia was removed along with the posterior horns of the menisci.    I completed the distal femoral preparation using the appropriate jig to prepare the box.  The patella was then measured, and cut with the saw.    The proximal tibia sized and prepared accordingly with the reamer and the punch, and then all components were trialed with the above sized poly insert.  The knee was found to have excellent balance and full motion.    The above named components were then impacted into place and Poly tibial piece and patella were inserted.  I was very happy with his stability and ROM  I performed a periarticular injection with Exparel   The knee was easily taken through a range of motion and the patella tracked well and the knee irrigated copiously and the parapatellar and subcutaneous tissue closed with vicryl, and monocryl with steri strips for the skin.  The incision was dressed with sterile gauze and the tourniquet released and the patient was awakened and returned to the  PACU in stable and satisfactory condition.  There were no complications.  Total tourniquet time was roughly 60 minutes.   POSTOPERATIVE PLAN: post op Abx, DVT px: SCD's, TED's, Early ambulation and chemical px

## 2024-03-01 NOTE — Evaluation (Signed)
 Physical Therapy Evaluation Patient Details Name: Diane Stein MRN: [id number] DOB: 1986-03-27 Today's Date: 03/01/2024  History of Present Illness  38 y.o. female s/p R TKA on 03/01/2024 due to AVN and failure of conservative measures. 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  Clinical Impression    Diane Stein is a 38 y.o. female POD 0 s/p R TKA. Patient reports IND with mobility at baseline. Patient is now limited by functional impairments (see PT problem list below) and requires CGA for bed mobility and min A for transfers. Patient was unable to safely ambulate at time of eval due to R LE instability with weight acceptance impacting motor coordination, control and strength attributed to slow regression of anesthesia. Patient instructed in exercise to facilitate ROM and circulation to manage edema. Patient will benefit from continued skilled PT interventions to address impairments and progress towards PLOF. Acute PT will follow to progress mobility and stair training in preparation for safe discharge home with family support and OPPT services.       If plan is discharge home, recommend the following: A little help with walking and/or transfers;A little help with bathing/dressing/bathroom;Assistance with cooking/housework;Assist for transportation;Help with stairs or ramp for entrance   Can travel by private vehicle        Equipment Recommendations None recommended by PT  Recommendations for Other Services       Functional Status Assessment Patient has had a recent decline in their functional status and demonstrates the ability to make significant improvements in function in a reasonable and predictable amount of time.     Precautions / Restrictions Precautions Precautions: Fall;Knee Restrictions Weight Bearing Restrictions Per Provider Order: No       Mobility  Bed Mobility Overal bed mobility: Needs Assistance Bed Mobility: Supine to Sit, Sit to Supine     Supine to sit: Supervision Sit to supine: Contact guard assist   General bed mobility comments: min cues and use of hospital bed, pt reporting numbness in buttocks and proximal B LE as well as R foot once seated EOB    Transfers Overall transfer level: Needs assistance Equipment used: Rolling walker (2 wheels) Transfers: Sit to/from Stand Sit to Stand: Min assist           General transfer comment: cues and pull to stand from EOB, R LE instabiltiy with weight acceptance attributed to slow regression of anesthesia    Ambulation/Gait               General Gait Details: pt able to side step to the R toward [facility] with mod A and heavy reliance on B UE support at RW due to R LE instabiliy  Stairs            Wheelchair Mobility     Tilt Bed    Modified Rankin (Stroke Patients Only)       Balance Overall balance assessment: Needs assistance Sitting-balance support: Feet supported Sitting balance-Leahy Scale: Fair     Standing balance support: Bilateral upper extremity supported, During functional activity, Reliant on assistive device for balance Standing balance-Leahy Scale: Poor                               Pertinent Vitals/Pain Pain Assessment Pain Assessment: 0-10 Pain Score: 7  Pain Location: R knee and LE Pain Descriptors /  Indicators: Aching, Constant, Discomfort, Dull, Grimacing, Operative site guarding, Numbness Pain Intervention(s): Limited activity within patient's tolerance, Monitored during session, Premedicated before session, Repositioned, Ice applied    Home Living Family/patient expects to be discharged to:: Private residence Living Arrangements: Children;Parent Available Help at Discharge: Family Type of Home: House Home Access: Stairs to enter Entrance Stairs-Rails: None Entrance Stairs-Number of Steps: 3    Home Layout: One level Home Equipment: Agricultural consultant (2 wheels);Cane - single point;Shower seat;BSC/3in1 Additional Comments: will have twin sister and mother providing assist at time of d/c    Prior Function Prior Level of Function : Independent/Modified Independent             Mobility Comments: IND no AD for all ADL and self care tasks       Extremity/Trunk Assessment        Lower Extremity Assessment Lower Extremity Assessment: RLE deficits/detail RLE Deficits / Details: ankle DF/PF 4/5; SLR > 10 degree lag RLE Sensation: decreased light touch;decreased proprioception    Cervical / Trunk Assessment Cervical / Trunk Assessment: Normal  Communication   Communication Communication: No apparent difficulties    Cognition Arousal: Alert Behavior During Therapy: WFL for tasks assessed/performed   PT - Cognitive impairments: No apparent impairments                         Following commands: Intact       Cueing       General Comments      Exercises Total Joint Exercises Ankle Circles/Pumps: AROM, Both, 5 reps   Assessment/Plan    PT Assessment Patient needs continued PT services  PT Problem List Decreased strength;Decreased range of motion;Decreased activity tolerance;Decreased balance;Decreased mobility;Decreased coordination;Pain       PT Treatment Interventions DME instruction;Gait training;Stair training;Functional mobility training;Therapeutic activities;Therapeutic exercise;Balance training;Neuromuscular re-education;Patient/family education;Modalities    PT Goals (Current goals can be found in the Care Plan section)  Acute Rehab PT Goals Patient Stated Goal: to be able to walk PT Goal Formulation: With patient Time For Goal Achievement: 03/15/24 Potential to Achieve Goals: Good    Frequency 7X/week     Co-evaluation               AM-PAC PT 6 Clicks Mobility  Outcome Measure Help needed turning from your back to your  side while in a flat bed without using bedrails?: None Help needed moving from lying on your back to sitting on the side of a flat bed without using bedrails?: A Little Help needed moving to and from a bed to a chair (including a wheelchair)?: A Little Help needed standing up from a chair using your arms (e.g., wheelchair or bedside chair)?: A Little Help needed to walk in hospital room?: A Lot Help needed climbing 3-5 steps with a railing? : Total 6 Click Score: 16    End of Session Equipment Utilized During Treatment: Gait belt Activity Tolerance: No increased pain;Patient tolerated treatment well;Other (comment) (limited due to R LE instabilty) Patient left: in chair;with bed alarm set;with family/visitor present   PT Visit Diagnosis: Unsteadiness on feet (R26.81);Other abnormalities of gait and mobility (R26.89);Muscle weakness (generalized) (M62.81);Difficulty in walking, not elsewhere classified (R26.2);Other (comment)    Time: 8565-8496 PT Time Calculation (min) (ACUTE ONLY): 29 min   Charges:   PT Evaluation $PT Eval Low Complexity: 1 Low PT Treatments $Therapeutic Activity: 8-22 mins PT General Charges $$ ACUTE PT VISIT: 1 Visit  [place], PT Acute Rehab   [place] VEAR Drone 03/01/2024, 3:34 PM

## 2024-03-01 NOTE — Progress Notes (Signed)
 Orthopedic Tech Progress Note Patient Details:  Diane Stein Oct 30, 1985 [id number]  CPM Right Knee CPM Right Knee: On Right Knee Flexion (Degrees): 60 Right Knee Extension (Degrees): 0  Post Interventions Patient Tolerated: Well Instructions Provided: Adjustment of device, Care of device  Waylan Thom Loving 03/01/2024, 11:35 AM

## 2024-03-01 NOTE — Interval H&P Note (Signed)
 History and Physical Interval Note:  03/01/2024 7:09 AM  Diane Stein  has presented today for surgery, with the diagnosis of OA RIGHT KNEE.  The various methods of treatment have been discussed with the patient and family. After consideration of risks, benefits and other options for treatment, the patient has consented to  Procedure(s): ARTHROPLASTY, KNEE, TOTAL (Right) as a surgical intervention.  The patient's history has been reviewed, patient examined, no change in status, stable for surgery.  I have reviewed the patient's chart and labs.  Questions were answered to the patient's satisfaction.     Diane Stein

## 2024-03-01 NOTE — Transfer of Care (Signed)
 Immediate Anesthesia Transfer of Care Note  Patient: Diane Stein  Procedure(s) Performed: ARTHROPLASTY, KNEE, TOTAL (Right: Knee)  Patient Location: PACU  Anesthesia Type:Spinal  Level of Consciousness: awake and alert   Airway & Oxygen Therapy: Patient Spontanous Breathing and Patient connected to face mask oxygen  Post-op Assessment: Report given to RN and Post -op Vital signs reviewed and stable  Post vital signs: Reviewed and stable  Last Vitals:  Vitals Value Taken Time  BP 101/60 03/01/24 11:02  Temp    Pulse 93 03/01/24 11:04  Resp 17 03/01/24 11:04  SpO2 100 % 03/01/24 11:04  Vitals shown include unfiled device data.  Last Pain:  Vitals:   03/01/24 0815  TempSrc:   PainSc: 0-No pain         Complications: No notable events documented.

## 2024-03-02 ENCOUNTER — Encounter (HOSPITAL_COMMUNITY): Payer: Self-pay | Admitting: Orthopedic Surgery

## 2024-03-02 DIAGNOSIS — G8929 Other chronic pain: Secondary | ICD-10-CM | POA: Diagnosis present

## 2024-03-02 DIAGNOSIS — Z96652 Presence of left artificial knee joint: Secondary | ICD-10-CM | POA: Diagnosis present

## 2024-03-02 DIAGNOSIS — Z9079 Acquired absence of other genital organ(s): Secondary | ICD-10-CM | POA: Diagnosis not present

## 2024-03-02 DIAGNOSIS — M1711 Unilateral primary osteoarthritis, right knee: Secondary | ICD-10-CM | POA: Diagnosis present

## 2024-03-02 DIAGNOSIS — Z91018 Allergy to other foods: Secondary | ICD-10-CM | POA: Diagnosis not present

## 2024-03-02 DIAGNOSIS — Z888 Allergy status to other drugs, medicaments and biological substances status: Secondary | ICD-10-CM | POA: Diagnosis not present

## 2024-03-02 DIAGNOSIS — Z23 Encounter for immunization: Secondary | ICD-10-CM | POA: Diagnosis not present

## 2024-03-02 DIAGNOSIS — Z90710 Acquired absence of both cervix and uterus: Secondary | ICD-10-CM | POA: Diagnosis not present

## 2024-03-02 DIAGNOSIS — F17210 Nicotine dependence, cigarettes, uncomplicated: Secondary | ICD-10-CM | POA: Diagnosis present

## 2024-03-02 DIAGNOSIS — Z79899 Other long term (current) drug therapy: Secondary | ICD-10-CM | POA: Diagnosis not present

## 2024-03-02 DIAGNOSIS — M879 Osteonecrosis, unspecified: Secondary | ICD-10-CM | POA: Diagnosis present

## 2024-03-02 DIAGNOSIS — F17290 Nicotine dependence, other tobacco product, uncomplicated: Secondary | ICD-10-CM | POA: Diagnosis present

## 2024-03-02 DIAGNOSIS — R7303 Prediabetes: Secondary | ICD-10-CM | POA: Diagnosis present

## 2024-03-02 DIAGNOSIS — F419 Anxiety disorder, unspecified: Secondary | ICD-10-CM | POA: Diagnosis present

## 2024-03-02 DIAGNOSIS — Z90722 Acquired absence of ovaries, bilateral: Secondary | ICD-10-CM | POA: Diagnosis not present

## 2024-03-02 DIAGNOSIS — F32A Depression, unspecified: Secondary | ICD-10-CM | POA: Diagnosis present

## 2024-03-02 DIAGNOSIS — Z8249 Family history of ischemic heart disease and other diseases of the circulatory system: Secondary | ICD-10-CM | POA: Diagnosis not present

## 2024-03-02 NOTE — Progress Notes (Signed)
 Orthopedic Tech Progress Note Patient Details:  Diane Stein July 02, 1985 [id number]  CPM Right Knee CPM Right Knee: Off Right Knee Flexion (Degrees): 60 Right Knee Extension (Degrees): 0  Post Interventions Patient Tolerated: Well Instructions Provided: Adjustment of device, Care of device  Waylan Thom Loving 03/02/2024, 6:28 PM

## 2024-03-02 NOTE — Progress Notes (Signed)
    Subjective: Patient reports pain as marked.  Tolerating diet.  Urinating.   No CP, SOB.  Has mobilized OOB with PT.  Objective:   VITALS:   Vitals:   03/02/24 0221 03/02/24 0558 03/02/24 0910 03/02/24 1343  BP: (!) 93/55 106/64 121/67 126/69  Pulse: 94 (!) 104 94 96  Resp: 20 20 15 15   Temp: 98.7 F (37.1 C) 98.1 F (36.7 C) 98 F (36.7 C) 98 F (36.7 C)  TempSrc: Oral Oral Oral   SpO2: 94% 94% 98% 95%  Weight:      Height:          Latest Ref Rng & Units 02/24/2024    9:44 AM 07/03/2023    3:23 PM 05/23/2023    3:21 PM  CBC  WBC 4.0 - 10.5 K/uL 9.4  5.4  12.5   Hemoglobin 12.0 - 15.0 g/dL 85.2  86.5  85.8   Hematocrit 36.0 - 46.0 % 46.1  39.9  42.4   Platelets 150 - 400 K/uL 297  207  289       Latest Ref Rng & Units 07/03/2023    3:23 PM 05/23/2023    3:21 PM 12/31/2022    6:15 PM  BMP  Glucose 70 - 99 mg/dL 890  899  840   BUN 6 - 20 mg/dL 12  11  10    Creatinine 0.44 - 1.00 mg/dL 9.26  9.29  9.33   Sodium 135 - 145 mmol/L 139  139  140   Potassium 3.5 - 5.1 mmol/L 3.3  3.7  3.6   Chloride 98 - 111 mmol/L 107  106  106   CO2 22 - 32 mmol/L 23  23  23    Calcium  8.9 - 10.3 mg/dL 9.0  9.0  9.7    Intake/Output      10/08 0701 10/09 0700   P.O. 360   I.V. (mL/kg)    IV Piggyback    Total Intake(mL/kg) 360 (5.2)   Urine (mL/kg/hr) 1200 (1.4)   Emesis/NG output 0   Stool 0   Blood    Total Output 1200   Net -840       Urine Occurrence 1 x   Stool Occurrence 0 x   Emesis Occurrence 0 x      Assessment: 1 Day Post-Op  S/P Procedure(s) (LRB): ARTHROPLASTY, KNEE, TOTAL (Right) by Dr. Evalene BIRCH. 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)   Avascular necrosis of medial femoral condyle, right (HCC)   Plan: Continue to work on pain control  Advance diet Up with therapy Plan for discharge tomorrow Incentive Spirometry Elevate and Apply  ice  Weightbearing: WBAT RLE Insicional and dressing care: Dressings left intact until follow-up and Reinforce dressings as needed Orthopedic device(s): CPM Showering: Keep dressing dry VTE prophylaxis: Aspirin  81mg  BID x 30 days, SCDs, ambulation Pain control: Percocet for chronic pain, additional meds for acute pain PRN Follow - up plan: 2 weeks Contact information:  Evalene Beverley MD, Gerard Large PA-C  Dispo: Home tomorrow hopefully     Gerard CHRISTELLA Large [place]ON[place] Office [phone] 03/02/2024, 7:10 PM

## 2024-03-02 NOTE — Progress Notes (Signed)
 Physical Therapy Treatment Patient Details Name: Diane Stein MRN: [id number] DOB: 10-Nov-1985 Today's Date: 03/02/2024   History of Present Illness 38 y.o. female s/p R TKA on 03/01/2024 due to AVN and failure of conservative measures. 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    PT Comments  POD # 1 am session PT - Cognition Comments: AxO x 3 pleasant and motivated.  Knowledgable from prior knee surgery.  Lives home alone and was IND. Assisted OOB to amb in hallway then returned to room to perform some TE's following HEP handout.  Instructed on proper tech, freq as well as use of ICE.      If plan is discharge home, recommend the following: A little help with walking and/or transfers;A little help with bathing/dressing/bathroom;Assistance with cooking/housework;Assist for transportation;Help with stairs or ramp for entrance   Can travel by private vehicle        Equipment Recommendations  None recommended by PT    Recommendations for Other Services       Precautions / Restrictions Precautions Precautions: Fall;Knee Precaution/Restrictions Comments: no pillow under Restrictions Weight Bearing Restrictions Per Provider Order: No     Mobility  Bed Mobility Overal bed mobility: Needs Assistance Bed Mobility: Supine to Sit, Sit to Supine     Supine to sit: Supervision Sit to supine: Supervision   General bed mobility comments: Pt able to self transfer OOB/IN bed by hooking her opposite LE.  Also demonstarted and Instructed on use of belt to guide R LE.    Transfers Overall transfer level: Needs assistance Equipment used: Rolling walker (2 wheels) Transfers: Sit to/from Stand Sit to Stand: Supervision, Contact guard assist           General transfer comment: good safety tech and use of hands to steady self.    Ambulation/Gait Ambulation/Gait  assistance: Supervision, Contact guard assist Gait Distance (Feet): 55 Feet Assistive device: Rolling walker (2 wheels) Gait Pattern/deviations: Step-to pattern, Step-through pattern, Decreased stance time - right Gait velocity: decreased     General Gait Details: Pt tolerated amb in hallway with walker 25% VC's on safety with turns.   Stairs             Wheelchair Mobility     Tilt Bed    Modified Rankin (Stroke Patients Only)       Balance                                            Communication Communication Communication: No apparent difficulties  Cognition Arousal: Alert Behavior During Therapy: WFL for tasks assessed/performed   PT - Cognitive impairments: No apparent impairments                       PT - Cognition Comments: AxO x 3 pleasant and motivated.  Knowledgable from prior knee surgery.  Lives home alone and was IND. Following commands: Intact      Cueing Cueing Techniques: Verbal cues  Exercises  Total Knee Replacement TE's following HEP handout 10 reps B LE ankle pumps 05 reps towel squeezes 05 reps knee presses 05 reps heel slides  05 reps SAQ's 05 reps SLR's 05 reps ABD Educated on use of gait belt to assist with TE's Followed by ICE  General Comments        Pertinent Vitals/Pain Pain Assessment Pain Assessment: 0-10 Pain Score: 8  Pain Location: R knee with activity/TE's Pain Descriptors / Indicators: Operative site guarding, Sore, Tightness, Tender Pain Intervention(s): Monitored during session, Premedicated before session, Repositioned, Ice applied    Home Living                          Prior Function            PT Goals (current goals can now be found in the care plan section) Progress towards PT goals: Progressing toward goals    Frequency    7X/week      PT Plan      Co-evaluation              AM-PAC PT 6 Clicks Mobility   Outcome Measure  Help  needed turning from your back to your side while in a flat bed without using bedrails?: None Help needed moving from lying on your back to sitting on the side of a flat bed without using bedrails?: None Help needed moving to and from a bed to a chair (including a wheelchair)?: A Little Help needed standing up from a chair using your arms (e.g., wheelchair or bedside chair)?: A Little Help needed to walk in hospital room?: A Little Help needed climbing 3-5 steps with a railing? : A Little 6 Click Score: 20    End of Session Equipment Utilized During Treatment: Gait belt Activity Tolerance: No increased pain;Patient tolerated treatment well;Other (comment) Patient left: in bed;with call bell/phone within reach Nurse Communication: Mobility status PT Visit Diagnosis: Unsteadiness on feet (R26.81);Other abnormalities of gait and mobility (R26.89);Muscle weakness (generalized) (M62.81);Difficulty in walking, not elsewhere classified (R26.2);Other (comment)     Time: 8987-8955 PT Time Calculation (min) (ACUTE ONLY): 32 min  Charges:    $Gait Training: 8-22 mins $Therapeutic Exercise: 8-22 mins PT General Charges $$ ACUTE PT VISIT: 1 Visit                    Katheryn Leap  PTA Acute  Rehabilitation Services Office M-F          [phone]

## 2024-03-02 NOTE — Plan of Care (Signed)
   Problem: Education: Goal: Knowledge of General Education information will improve Description Including pain rating scale, medication(s)/side effects and non-pharmacologic comfort measures Outcome: Progressing

## 2024-03-02 NOTE — Progress Notes (Signed)
 Orthopedic Tech Progress Note Patient Details:  Diane Stein July 06, 1985 [id number]  CPM Right Knee CPM Right Knee: On Right Knee Flexion (Degrees): 60 Right Knee Extension (Degrees): 0  Post Interventions Patient Tolerated: Well Instructions Provided: Adjustment of device, Care of device  Waylan Thom Loving 03/02/2024, 2:49 PM

## 2024-03-02 NOTE — Progress Notes (Signed)
   03/02/24 0935  TOC Brief Assessment  Insurance and Status Reviewed  Patient has primary care physician Yes  Home environment has been reviewed home  Prior level of function: independent  Prior/Current Home Services No current home services  Social Drivers of Health Review SDOH reviewed no interventions necessary  Readmission risk has been reviewed Yes  Transition of care needs no transition of care needs at this time   OPPT. No DME needs.

## 2024-03-02 NOTE — Progress Notes (Signed)
 Physical Therapy Treatment Patient Details Name: Diane Stein MRN: [id number] DOB: 1986-01-12 Today's Date: 03/02/2024   History of Present Illness 38 y.o. female s/p R TKA on 03/01/2024 due to AVN and failure of conservative measures. 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    PT Comments  POD # 1 pm session Assisted with amb in hallway a greater distance then practiced stairs.  General stair comments: up forward with walker as Pt has NO RAILS with Therapist securing walker behind Pt on the way up then Therapist in front on downward.  No family to present to Educate.  Will repeat stairs tomorrow. Then returned to room to perform some TE's following HEP handout.  Instructed on proper tech, freq as well as use of ICE.   Pt plans to D/C to home tomorrow due to issues of pain control.    If plan is discharge home, recommend the following: A little help with walking and/or transfers;A little help with bathing/dressing/bathroom;Assistance with cooking/housework;Assist for transportation;Help with stairs or ramp for entrance   Can travel by private vehicle        Equipment Recommendations  None recommended by PT    Recommendations for Other Services       Precautions / Restrictions Precautions Precautions: Fall;Knee Precaution/Restrictions Comments: no pillow under Restrictions Weight Bearing Restrictions Per Provider Order: No     Mobility  Bed Mobility Overal bed mobility: Needs Assistance Bed Mobility: Supine to Sit, Sit to Supine     Supine to sit: Supervision Sit to supine: Supervision   General bed mobility comments: Pt able to self transfer OOB/IN bed by hooking her opposite LE.  Also demonstarted and Instructed on use of belt to guide R LE.    Transfers Overall transfer level: Needs assistance Equipment used: Rolling walker (2 wheels) Transfers: Sit  to/from Stand Sit to Stand: Supervision, Contact guard assist           General transfer comment: good safety tech and use of hands to steady self.    Ambulation/Gait Ambulation/Gait assistance: Supervision, Contact guard assist Gait Distance (Feet): 74 Feet Assistive device: Rolling walker (2 wheels) Gait Pattern/deviations: Step-to pattern, Step-through pattern, Decreased stance time - right Gait velocity: decreased     General Gait Details: Pt tolerated amb in hallway with walker 25% VC's on safety with turns.   Stairs Stairs: Yes Stairs assistance: Supervision, Contact guard assist Stair Management: No rails, Step to pattern, Forwards Number of Stairs: 2 General stair comments: up forward with walker as Pt has NO RAILS with Therapist securing walker behind Pt on the way up then Therapist in front on downward.  No family to present to Educate.  Will repeat stairs tomorrow.   Wheelchair Mobility     Tilt Bed    Modified Rankin (Stroke Patients Only)       Balance                                            Communication Communication Communication: No apparent difficulties  Cognition Arousal: Alert Behavior During Therapy: WFL for tasks assessed/performed   PT - Cognitive impairments: No apparent impairments                       PT - Cognition  Comments: AxO x 3 pleasant and motivated.  Knowledgable from prior knee surgery.  Lives home alone and was IND. Following commands: Intact      Cueing Cueing Techniques: Verbal cues  Exercises  05 reps all seated TE's following HEP handout    General Comments        Pertinent Vitals/Pain Pain Assessment Pain Assessment: 0-10 Pain Score: 8  Pain Location: R knee with activity/TE's Pain Descriptors / Indicators: Operative site guarding, Sore, Tightness, Tender Pain Intervention(s): Monitored during session, Premedicated before session, Repositioned, Ice applied    Home Living                           Prior Function            PT Goals (current goals can now be found in the care plan section) Progress towards PT goals: Progressing toward goals    Frequency    7X/week      PT Plan      Co-evaluation              AM-PAC PT 6 Clicks Mobility   Outcome Measure  Help needed turning from your back to your side while in a flat bed without using bedrails?: None Help needed moving from lying on your back to sitting on the side of a flat bed without using bedrails?: None Help needed moving to and from a bed to a chair (including a wheelchair)?: A Little Help needed standing up from a chair using your arms (e.g., wheelchair or bedside chair)?: A Little Help needed to walk in hospital room?: A Little Help needed climbing 3-5 steps with a railing? : A Little 6 Click Score: 20    End of Session Equipment Utilized During Treatment: Gait belt Activity Tolerance: Patient tolerated treatment well Patient left: in bed;with call bell/phone within reach Nurse Communication: Mobility status PT Visit Diagnosis: Unsteadiness on feet (R26.81);Other abnormalities of gait and mobility (R26.89);Muscle weakness (generalized) (M62.81);Difficulty in walking, not elsewhere classified (R26.2);Other (comment)     Time: 8649-8582 PT Time Calculation (min) (ACUTE ONLY): 27 min  Charges:    $Gait Training: 8-22 mins $Therapeutic Exercise: 8-22 mins PT General Charges $$ ACUTE PT VISIT: 1 Visit                     Katheryn Leap  PTA Acute  Rehabilitation Services Office M-F          [phone]

## 2024-03-03 MED ORDER — MELOXICAM 15 MG PO TABS: 15.0000 mg | ORAL_TABLET | Freq: Every day | ORAL | 0 refills | Status: AC | PRN

## 2024-03-03 MED ORDER — METHOCARBAMOL 750 MG PO TABS: 750.0000 mg | ORAL_TABLET | Freq: Three times a day (TID) | ORAL | 0 refills | Status: AC | PRN

## 2024-03-03 MED ORDER — ONDANSETRON 4 MG PO TBDP: 4.0000 mg | ORAL_TABLET | Freq: Three times a day (TID) | ORAL | 0 refills | Status: AC | PRN

## 2024-03-03 MED ORDER — HYDROMORPHONE HCL 2 MG PO TABS
1.0000 mg | ORAL_TABLET | Freq: Four times a day (QID) | ORAL | Status: DC | PRN
  Administered 2024-03-03 – 2024-03-04 (×4): 1 mg via ORAL
  Filled 2024-03-03 (×4): qty 1

## 2024-03-03 MED ORDER — HYDROMORPHONE HCL 2 MG PO TABS: 1.0000 mg | ORAL_TABLET | ORAL | 0 refills | Status: AC | PRN

## 2024-03-03 MED ORDER — ACETAMINOPHEN 500 MG PO TABS: 500.0000 mg | ORAL_TABLET | Freq: Three times a day (TID) | ORAL | 0 refills | Status: AC | PRN

## 2024-03-03 MED ORDER — OXYCODONE HCL 5 MG PO TABS
5.0000 mg | ORAL_TABLET | ORAL | Status: DC | PRN
  Administered 2024-03-03 – 2024-03-04 (×4): 5 mg via ORAL
  Filled 2024-03-03 (×4): qty 1

## 2024-03-03 MED ORDER — ASPIRIN 81 MG PO TBEC: 81.0000 mg | DELAYED_RELEASE_TABLET | Freq: Two times a day (BID) | ORAL | 0 refills | Status: AC

## 2024-03-03 MED ORDER — NALOXONE HCL 4 MG/0.1ML NA LIQD: 1.0000 | Freq: Once | NASAL | 0 refills | Status: AC | PRN

## 2024-03-03 NOTE — Discharge Summary (Signed)
 Physician Discharge Summary  Patient ID: Diane Stein MRN: [id number] DOB/AGE: 08-15-1985 38 y.o.  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 female who was admitted 03/01/2024 with a diagnosis of right distal femur avascular necrosis and went to the operating room on 03/01/2024 and underwent the above named procedures.    She was given perioperative antibiotics:  Anti-infectives (From admission, onward)    Start     Dose/Rate Route Frequency Ordered Stop   03/01/24 1515  ceFAZolin  (ANCEF ) IVPB 2g/100 mL premix        2 g 200 mL/hr over 30 Minutes Intravenous Every 6 hours 03/01/24 1419 03/01/24 2106   03/01/24 0615  ceFAZolin  (ANCEF ) IVPB 2g/100 mL premix        2 g 200 mL/hr over 30 Minutes Intravenous On call to O.R. 03/01/24 [id number] 03/01/24 0925     .  She was given sequential compression devices, early ambulation, and ASA for DVT prophylaxis.  She benefited maximally from the hospital stay and there were no complications.    Recent vital signs:  Vitals:   03/03/24 0417 03/03/24 1335  BP: 127/67 127/67  Pulse: (!) 101 (!) 102  Resp: 16 16   Temp: 97.9 F (36.6 C) 98 F (36.7 C)  SpO2: 97% 96%    Recent laboratory studies:  Lab Results  Component Value Date   HGB 14.7 02/24/2024   HGB 13.4 07/03/2023   HGB 14.1 05/23/2023   Lab Results  Component Value Date   WBC 9.4 02/24/2024   PLT 297 02/24/2024   No results found for: INR Lab Results  Component Value Date   NA 139 07/03/2023   K 3.3 (L) 07/03/2023   CL 107 07/03/2023   CO2 23 07/03/2023   BUN 12 07/03/2023   CREATININE 0.73 07/03/2023   GLUCOSE 109 (H) 07/03/2023    Discharge Medications:   Allergies as of 03/03/2024       Reactions   Avocado Anaphylaxis, Shortness Of Breath   dizziness   Other Hives   Pine Nuts = HIVES   Benadryl  [diphenhydramine ]    Restless leg syndrome        Medication List     STOP taking these medications    benzonatate  100 MG capsule Commonly known as: TESSALON    cyclobenzaprine  10 MG tablet Commonly known as: FLEXERIL    ondansetron  4 MG tablet Commonly known as: Zofran        TAKE these medications    acetaminophen  500 MG tablet Commonly known as: TYLENOL  Take 1 tablet (500 mg total)  by mouth every 8 (eight) hours as needed for mild pain (pain score 1-3) or moderate pain (pain score 4-6).   albuterol  108 (90 Base) MCG/ACT inhaler Commonly known as: VENTOLIN  HFA Inhale 1-2 puffs into the lungs every 6 (six) hours as needed for wheezing or shortness of breath.   alprazolam  2 MG tablet Commonly known as: XANAX  Take 2 mg by mouth 3 (three) times daily as needed for anxiety.   aspirin  EC 81 MG tablet Take 1 tablet (81 mg total) by mouth 2 (two) times daily. To prevent blood clots for 30 days after surgery. What changed: additional instructions   Belsomra 20 MG Tabs Generic drug: Suvorexant Take 20 mg by mouth at bedtime.   EPINEPHrine  0.3 mg/0.3 mL Soaj injection Commonly known as: EPI-PEN Inject 0.3 mLs (0.3 mg total) into the muscle once as needed for up to 1 dose (anaphalaxis).    estradiol  2 MG tablet Commonly known as: ESTRACE  Take 2 mg by mouth daily.   HYDROmorphone  2 MG tablet Commonly known as: Dilaudid  Take 0.5-1 tablets (1-2 mg total) by mouth every 4 (four) hours as needed for severe pain (pain score 7-10). after surgery to the knee that is not controlled by first taking your normal daily dose of Percocet for chronic pain. USE WITH CAUTION DUE TO INCREASED RISK OF OVERDOSE! USE NARCAN  IF NEEDED   levocetirizine 5 MG tablet Commonly known as: XYZAL Take 5 mg by mouth every evening.   meloxicam 15 MG tablet Commonly known as: MOBIC Take 1 tablet (15 mg total) by mouth daily as needed for pain (and inflammation).   methocarbamol  750 MG tablet Commonly known as: ROBAXIN  Take 1 tablet (750 mg total) by mouth every 8 (eight) hours as needed for muscle spasms. What changed:  medication strength how much to take   naloxone  4 MG/0.1ML Liqd nasal spray kit Commonly known as: NARCAN  Place 1 spray into the nose once as needed. to reverse effects of an opioid overdose   omeprazole  20 MG tablet Commonly known as: PriLOSEC  OTC Take 1 tablet (20 mg total) by mouth daily. For gastric protection while taking pain medicines.   ondansetron  4 MG disintegrating tablet Commonly known as: ZOFRAN -ODT Take 1 tablet (4 mg total) by mouth every 8 (eight) hours as needed for nausea or vomiting.   oxyCODONE -acetaminophen  5-325 MG tablet Commonly known as: PERCOCET/ROXICET Take 1 tablet by mouth every 6 (six) hours as needed for severe pain (pain score 7-10). What changed: when to take this   potassium chloride  SA 20 MEQ tablet Commonly known as: KLOR-CON  M Take 1 tablet (20 mEq total) by mouth 2 (two) times daily.   sertraline 100 MG tablet Commonly known as: ZOLOFT Take 150 mg by mouth daily.               Discharge Care Instructions  (From admission, onward)           Start     Ordered   03/03/24 0000  Weight bearing as tolerated        03/03/24  1732            Diagnostic Studies: DG Knee Right Port Result Date: 03/01/2024 CLINICAL DATA:  Status post right knee arthroplasty. EXAM: PORTABLE RIGHT KNEE - 1-2 VIEW COMPARISON:  None Available. FINDINGS: Right knee arthroplasty in expected alignment. No periprosthetic lucency or fracture. Recent postsurgical change includes air and edema in the soft tissues and joint space. IMPRESSION: Right knee arthroplasty without immediate postoperative complication. Electronically  Signed   By: Andrea Gasman M.D.   On: 03/01/2024 12:25    Disposition: Discharge disposition: 01-Home or Self Care       Discharge Instructions     CPM   Complete by: As directed    Continuous passive motion machine (CPM):      Use the CPM from 0 to 90 degrees for 4-6 hours per day.      You may break it up into 2 or 3 sessions per day.      Use CPM for 3 weeks or until you are told to stop.   Call MD / Call 911   Complete by: As directed    If you experience chest pain or shortness of breath, CALL 911 and be transported to the hospital emergency room.  If you develope a fever above 101 F, pus (white drainage) or increased drainage or redness at the wound, or calf pain, call your surgeon's office.   Diet - low sodium heart healthy   Complete by: As directed    Discharge instructions   Complete by: As directed    You may bear weight as tolerated. Keep your dressing on and dry until follow up. Take medicine to prevent blood clots as directed. Take pain medicine as needed with the goal of transitioning to over the counter medicines. It is okay to take 2 tablets of your narcotic medicine instead of just 1 tablet in the first 1-2 days after surgery when pain is at the worst. Do not continue to do this for multiple days at a time beyond that though.    INSTRUCTIONS AFTER JOINT REPLACEMENT   Remove items at home which could result in a fall. This includes throw rugs or furniture in walking pathways ICE to the  affected joint every three hours while awake for 30 minutes at a time, for at least the first 3-5 days, and then as needed for pain and swelling.  Continue to use ice for pain and swelling. You may notice swelling that will progress down to the foot and ankle.  This is normal after surgery.  Elevate your leg when you are not up walking on it.   Continue to use the breathing machine you got in the hospital (incentive spirometer) which will help keep your temperature down.  It is common for your temperature to cycle up and down following surgery, especially at night when you are not up moving around and exerting yourself.  The breathing machine keeps your lungs expanded and your temperature down.   DIET:  As you were doing prior to hospitalization, we recommend a well-balanced diet.  DRESSING / WOUND CARE / SHOWERING  You may shower 3 days after surgery, but keep the wounds dry during showering.  You may use an occlusive plastic wrap (Press'n Seal for example) with blue painter's tape at edges, NO SOAKING/SUBMERGING IN THE BATHTUB.  If the bandage gets wet, call the office.   ACTIVITY  Increase activity slowly as tolerated, but follow the weight bearing instructions below.   No driving for 6 weeks or until further direction given by your physician.  You cannot drive while taking narcotics.  No lifting or carrying greater than 10 lbs. until further directed by your surgeon. Avoid periods of inactivity such as sitting longer than an hour when not asleep. This helps prevent blood clots.  You may return to work once you are authorized by your doctor.    WEIGHT BEARING   Weight bearing as  tolerated with assist device (walker, cane, etc) as directed, use it as long as suggested by your surgeon or therapist, typically at least 4-6 weeks.   EXERCISES  Results after joint replacement surgery are often greatly improved when you follow the exercise, range of motion and muscle strengthening exercises  prescribed by your doctor. Safety measures are also important to protect the joint from further injury. Any time any of these exercises cause you to have increased pain or swelling, decrease what you are doing until you are comfortable again and then slowly increase them. If you have problems or questions, call your caregiver or physical therapist for advice.   Rehabilitation is important following a joint replacement. After just a few days of immobilization, the muscles of the leg can become weakened and shrink (atrophy).  These exercises are designed to build up the tone and strength of the thigh and leg muscles and to improve motion. Often times heat used for twenty to thirty minutes before working out will loosen up your tissues and help with improving the range of motion but do not use heat for the first two weeks following surgery (sometimes heat can increase post-operative swelling).   These exercises can be done on a training (exercise) mat, on the floor, on a table or on a bed. Use whatever works the best and is most comfortable for you.    Use music or television while you are exercising so that the exercises are a pleasant break in your day. This will make your life better with the exercises acting as a break in your routine that you can look forward to.   Perform all exercises about fifteen times, three times per day or as directed.  You should exercise both the operative leg and the other leg as well.  Exercises include:   Quad Sets - Tighten up the muscle on the front of the thigh (Quad) and hold for 5-10 seconds.   Straight Leg Raises - With your knee straight (if you were given a brace, keep it on), lift the leg to 60 degrees, hold for 3 seconds, and slowly lower the leg.  Perform this exercise against resistance later as your leg gets stronger.  Leg Slides: Lying on your back, slowly slide your foot toward your buttocks, bending your knee up off the floor (only go as far as is  comfortable). Then slowly slide your foot back down until your leg is flat on the floor again.  Angel Wings: Lying on your back spread your legs to the side as far apart as you can without causing discomfort.  Hamstring Strength:  Lying on your back, push your heel against the floor with your leg straight by tightening up the muscles of your buttocks.  Repeat, but this time bend your knee to a comfortable angle, and push your heel against the floor.  You may put a pillow under the heel to make it more comfortable if necessary.   A rehabilitation program following joint replacement surgery can speed recovery and prevent re-injury in the future due to weakened muscles. Contact your doctor or a physical therapist for more information on knee rehabilitation.    CONSTIPATION  Constipation is defined medically as fewer than three stools per week and severe constipation as less than one stool per week.  Even if you have a regular bowel pattern at home, your normal regimen is likely to be disrupted due to multiple reasons following surgery.  Combination of anesthesia, postoperative narcotics, change in  appetite and fluid intake all can affect your bowels.   YOU MUST use at least one of the following options; they are listed in order of increasing strength to get the job done.  They are all available over the counter, and you may need to use some, POSSIBLY even all of these options:    Drink plenty of fluids (prune juice may be helpful) and high fiber foods Colace 100 mg by mouth twice a day  Senokot for constipation as directed and as needed Dulcolax (bisacodyl ), take with full glass of water  Miralax  (polyethylene glycol) once or twice a day as needed.  If you have tried all these things and are unable to have a bowel movement in the first 3-4 days after surgery call either your surgeon or your primary doctor.    If you experience loose stools or diarrhea, hold the medications until you stool forms back  up.  If your symptoms do not get better within 1 week or if they get worse, check with your doctor.  If you experience the worst abdominal pain ever or develop nausea or vomiting, please contact the office immediately for further recommendations for treatment.   ITCHING:  If you experience itching with your medications, try taking only a single pain pill, or even half a pain pill at a time.  You can also use Benadryl  over the counter for itching or also to help with sleep.   TED HOSE STOCKINGS:  Use stockings on both legs until for at least 2 weeks or as directed by physician office. They may be removed at night for sleeping.  MEDICATIONS:  See your medication summary on the After Visit Summary that nursing will review with you.  You may have some home medications which will be placed on hold until you complete the course of blood thinner medication.  It is important for you to complete the blood thinner medication as prescribed.  Take medicines as prescribed.   You have several different medicines that work in different ways. - Tylenol  is for mild to moderate pain. Try to take this medicine before turning to your narcotic medicines.  - Meloxicam is to reduce pain / inflammation - Robaxin  is for muscle spasms. This medicine can make you drowsy. - Oxycodone  is a narcotic pain medicine.  Take this for severe pain. This medicine can be dehydrating / constipating. - Zofran  is for nausea and vomiting. - Colace is for constipation prevention while taking pain medicine.  - Aspirin  is to prevent blood clots after surgery. YOU MUST TAKE THIS MEDICINE!!  PRECAUTIONS:  If you experience chest pain or shortness of breath - call 911 immediately for transfer to the hospital emergency department.   If you develop a fever greater that 101 F, purulent drainage from wound, increased redness or drainage from wound, foul odor from the wound/dressing, or calf pain - CONTACT YOUR SURGEON.                                                    FOLLOW-UP APPOINTMENTS:  If you do not already have a post-op appointment, please call the office [phone] for an appointment to be seen by Dr. Beverley in 2 weeks.   OTHER INSTRUCTIONS:   MAKE SURE YOU:  Understand these instructions.  Get help right away if you are not doing well  or get worse.    Thank you for letting us  be a part of your medical care team.  It is a privilege we respect greatly.  We hope these instructions will help you stay on track for a fast and full recovery!   Do not put a pillow under the knee. Place it under the heel.   Complete by: As directed    Driving restrictions   Complete by: As directed    No driving for 2-6 weeks   Post-operative opioid taper instructions:   Complete by: As directed    POST-OPERATIVE OPIOID TAPER INSTRUCTIONS: It is important to wean off of your opioid medication as soon as possible. If you do not need pain medication after your surgery it is ok to stop day one. Opioids include: Codeine, Hydrocodone (Norco, Vicodin), Oxycodone (Percocet, oxycontin ) and hydromorphone  amongst others.  Long term and even short term use of opiods can cause: Increased pain response Dependence Constipation Depression Respiratory depression And more.  Withdrawal symptoms can include Flu like symptoms Nausea, vomiting And more Techniques to manage these symptoms Hydrate well Eat regular healthy meals Stay active Use relaxation techniques(deep breathing, meditating, yoga) Do Not substitute Alcohol to help with tapering If you have been on opioids for less than two weeks and do not have pain than it is ok to stop all together.  Plan to wean off of opioids This plan should start within one week post op of your joint replacement. Maintain the same interval or time between taking each dose and first decrease the dose.  Cut the total daily intake of opioids by one tablet each day Next start to increase the time between  doses. The last dose that should be eliminated is the evening dose.      TED hose   Complete by: As directed    Use stockings (TED hose) for 2 weeks on right leg(s).  You may remove them at night for sleeping.   Weight bearing as tolerated   Complete by: As directed         Follow-up Information     Beverley Evalene BIRCH, MD. Go on 03/16/2024.   Specialty: Orthopedic Surgery Why: Your appointment is at 4:15pm Contact information: [address] [place] [address] [phone]                  Signed: Gerard CHRISTELLA Ted DEVONNA Office [phone] 03/03/2024, 5:34 PM

## 2024-03-03 NOTE — Plan of Care (Signed)
  Problem: Health Behavior/Discharge Planning: Goal: Ability to manage health-related needs will improve Outcome: Adequate for Discharge   Problem: Clinical Measurements: Goal: Ability to maintain clinical measurements within normal limits will improve Outcome: Progressing Goal: Will remain free from infection Outcome: Progressing Goal: Diagnostic test results will improve Outcome: Progressing   Problem: Activity: Goal: Risk for activity intolerance will decrease Outcome: Adequate for Discharge   Problem: Coping: Goal: Level of anxiety will decrease Outcome: Progressing   Problem: Elimination: Goal: Will not experience complications related to bowel motility Outcome: Progressing   Problem: Pain Managment: Goal: General experience of comfort will improve and/or be controlled Outcome: Progressing   Problem: Safety: Goal: Ability to remain free from injury will improve Outcome: Progressing   Problem: Education: Goal: Knowledge of the prescribed therapeutic regimen will improve Outcome: Adequate for Discharge   Problem: Activity: Goal: Ability to avoid complications of mobility impairment will improve Outcome: Adequate for Discharge Goal: Range of joint motion will improve Outcome: Adequate for Discharge   Problem: Clinical Measurements: Goal: Postoperative complications will be avoided or minimized Outcome: Adequate for Discharge   Problem: Pain Management: Goal: Pain level will decrease with appropriate interventions Outcome: Progressing   Problem: Skin Integrity: Goal: Will show signs of wound healing Outcome: Progressing

## 2024-03-03 NOTE — Progress Notes (Signed)
 Physical Therapy Treatment Patient Details Name: Diane Stein MRN: [id number] DOB: 09-Aug-1985 Today's Date: 03/03/2024   History of Present Illness 38 y.o. female s/p R TKA on 03/01/2024 due to AVN and failure of conservative measures. 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    PT Comments  POD # 2 am session Pt progressing well and plans to D/C to home later today. Pt self able to get IN/OOB as well as transfer ON/off all surfaces.  Amb well in hallway and practiced stairs.  General stair comments: up forward with walker as Pt has NO RAILS with Therapist securing walker behind Pt on the way up then Therapist in front on downward.  No family to present to Educate.  Then returned to room to perform some TE's following HEP handout.  Instructed on proper tech, freq as well as use of ICE.   Addressed all mobility questions, discussed appropriate activity, educated on use of ICE.  Pt ready for D/C to home.    If plan is discharge home, recommend the following: A little help with walking and/or transfers;A little help with bathing/dressing/bathroom;Assistance with cooking/housework;Assist for transportation;Help with stairs or ramp for entrance   Can travel by private vehicle        Equipment Recommendations  None recommended by PT    Recommendations for Other Services       Precautions / Restrictions Precautions Precautions: Fall;Knee Precaution/Restrictions Comments: no pillow under Restrictions Weight Bearing Restrictions Per Provider Order: No     Mobility  Bed Mobility Overal bed mobility: Needs Assistance Bed Mobility: Supine to Sit, Sit to Supine     Supine to sit: Modified independent (Device/Increase time) Sit to supine: Modified independent (Device/Increase time)   General bed mobility comments: self able in/OOB    Transfers Overall transfer level:  Needs assistance Equipment used: Rolling walker (2 wheels) Transfers: Sit to/from Stand Sit to Stand: Modified independent (Device/Increase time)           General transfer comment: good safety tech and use of hands to steady self.    Ambulation/Gait Ambulation/Gait assistance: Modified independent (Device/Increase time) Gait Distance (Feet): 65 Feet Assistive device: Rolling walker (2 wheels) Gait Pattern/deviations: Step-to pattern, Step-through pattern, Decreased stance time - right Gait velocity: decreased     General Gait Details: self able with walker and good safety cognition   Stairs Stairs: Yes Stairs assistance: Supervision, Contact guard assist Stair Management: No rails, Step to pattern, Forwards Number of Stairs: 2 General stair comments: up forward with walker as Pt has NO RAILS with Therapist securing walker behind Pt on the way up then Therapist in front on downward.  No family to present to Educate.   Wheelchair Mobility     Tilt Bed    Modified Rankin (Stroke Patients Only)       Balance                                            Communication Communication Communication: No apparent difficulties  Cognition Arousal: Alert Behavior During Therapy: WFL for tasks assessed/performed   PT - Cognitive impairments: No apparent impairments                       PT - Cognition Comments: AxO x  3 pleasant and motivated.  Knowledgable from prior knee surgery.  Lives home alone and was IND. Following commands: Intact      Cueing Cueing Techniques: Verbal cues  Exercises  Total Knee Replacement TE's following HEP handout 10 reps B LE ankle pumps 05 reps towel squeezes 05 reps knee presses 05 reps heel slides  05 reps SAQ's 05 reps SLR's 05 reps ABD 03 reps all seated TE's  Educated on use of gait belt to assist with TE's Followed by ICE     General Comments        Pertinent Vitals/Pain Pain Assessment Pain  Assessment: 0-10 Pain Score: 8  Pain Location: R knee with activity/TE's Pain Intervention(s): Monitored during session, RN gave pain meds during session, Patient requesting pain meds-RN notified, Repositioned, Ice applied    Home Living                          Prior Function            PT Goals (current goals can now be found in the care plan section) Progress towards PT goals: Progressing toward goals    Frequency    7X/week      PT Plan      Co-evaluation              AM-PAC PT 6 Clicks Mobility   Outcome Measure  Help needed turning from your back to your side while in a flat bed without using bedrails?: None Help needed moving from lying on your back to sitting on the side of a flat bed without using bedrails?: None Help needed moving to and from a bed to a chair (including a wheelchair)?: None Help needed standing up from a chair using your arms (e.g., wheelchair or bedside chair)?: None Help needed to walk in hospital room?: None Help needed climbing 3-5 steps with a railing? : A Little 6 Click Score: 23    End of Session Equipment Utilized During Treatment: Gait belt Activity Tolerance: Patient tolerated treatment well Patient left: in bed;with call bell/phone within reach Nurse Communication: Mobility status PT Visit Diagnosis: Unsteadiness on feet (R26.81);Other abnormalities of gait and mobility (R26.89);Muscle weakness (generalized) (M62.81);Difficulty in walking, not elsewhere classified (R26.2);Other (comment)     Time: 1000-1027 PT Time Calculation (min) (ACUTE ONLY): 27 min  Charges:    $Gait Training: 8-22 mins $Therapeutic Exercise: 8-22 mins PT General Charges $$ ACUTE PT VISIT: 1 Visit                     Katheryn Leap  PTA Acute  Rehabilitation Services Office M-F          [phone]

## 2024-03-03 NOTE — Progress Notes (Signed)
 Subjective: Patient reports pain as marked. Review of  chart shows a very large  amount of narcotic  intake post-op beyond what is normally expected. She is a chronic pain patient but HAS to wean down on the extra narcotics for acute pain.  Yesterday  she  had 6mg   Dilaudid  PO ,  35mg  Oxy PO,  and  her normal  20mg   Percocet for  chronic pain.    Tolerating diet.  Urinating.   No CP, SOB.  Has mobilized OOB with PT.  Objective:   VITALS:   Vitals:   03/02/24 0910 03/02/24 1343 03/02/24 2221 03/03/24 0417  BP: 121/67 126/69 113/64 127/67  Pulse: 94 96 (!) 101 (!) 101  Resp: 15 15 16 16   Temp: 98 F (36.7 C) 98 F (36.7 C) 98.1 F (36.7 C) 97.9 F (36.6 C)  TempSrc: Oral  Oral Oral  SpO2: 98% 95% 94% 97%  Weight:      Height:          Latest Ref Rng & Units 02/24/2024    9:44 AM 07/03/2023    3:23 PM 05/23/2023    3:21 PM  CBC  WBC 4.0 - 10.5 K/uL 9.4  5.4  12.5   Hemoglobin 12.0 - 15.0 g/dL 85.2  86.5  85.8   Hematocrit 36.0 - 46.0 % 46.1  39.9  42.4   Platelets 150 - 400 K/uL 297  207  289       Latest Ref Rng & Units 07/03/2023    3:23 PM 05/23/2023    3:21 PM 12/31/2022    6:15 PM  BMP  Glucose 70 - 99 mg/dL 890  899  840   BUN 6 - 20 mg/dL 12  11  10    Creatinine 0.44 - 1.00 mg/dL 9.26  9.29  9.33   Sodium 135 - 145 mmol/L 139  139  140   Potassium 3.5 - 5.1 mmol/L 3.3  3.7  3.6   Chloride 98 - 111 mmol/L 107  106  106   CO2 22 - 32 mmol/L 23  23  23    Calcium  8.9 - 10.3 mg/dL 9.0  9.0  9.7    Intake/Output      10/08 0701 10/09 0700 10/09 0701 10/10 0700   P.O. 360    I.V. (mL/kg)     IV Piggyback     Total Intake(mL/kg) 360 (5.2)    Urine (mL/kg/hr) 1200 (0.7)    Emesis/NG output 0    Stool 0    Blood     Total Output 1200    Net -840         Urine Occurrence 3 x    Stool Occurrence 0 x    Emesis Occurrence 0 x      Physical Exam: General: NAD.  Sitting up in bed, calm, comfortable Resp: No increased wob Cardio: regular rate and rhythm ABD  soft Neurologically intact MSK Neurovascularly intact Sensation intact distally Intact pulses distally Dorsiflexion/Plantar flexion intact Incision: dressing C/D/I Ice to right knee      Assessment: 2 Days Post-Op  S/P Procedure(s) (LRB): ARTHROPLASTY, KNEE, TOTAL (Right) by Dr. Evalene BIRCH. 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)   Avascular necrosis of medial femoral condyle, right (HCC)   Plan: Continue to work on pain control  Advance diet Up with therapy Plan for discharge tomorrow  Incentive Spirometry Elevate and Apply ice  Weightbearing: WBAT RLE Insicional and dressing care: Dressings left intact until follow-up and Reinforce dressings as needed Orthopedic device(s): CPM Showering: Keep dressing dry VTE prophylaxis: Aspirin  81mg  BID x 30 days, SCDs, ambulation Pain control: Percocet for chronic pain, additional meds for acute pain PRN Follow - up plan: 2 weeks Contact information:  Evalene Chancy MD, Krislynn Gronau PA-C  Dispo: Home in the morning. Her pharmacy is now closed since it is past 5pm so she would not be able to pick up her post-op meds and thus would go overnight without anything. This is not ideal and she'd likely end up in the ER due to pain. In order to be safe, she will stay tonight so she can still have access to PRN meds. I did discuss needing to continue to wean down on pain meds. I will put all the d/c orders in so she will be good to go in the morning.      Gerard CHRISTELLA Large, PA-C Office [phone] 03/03/2024, 8:48 AM

## 2024-03-04 NOTE — TOC Transition Note (Signed)
 Transition of Care [facility]) - Discharge Note   Patient Details  Name: Diane Stein MRN: [id number] Date of Birth: 1985/09/12  Transition of Care [facility]) CM/SW Contact:  Heather DELENA Saltness, LCSW Phone Number: 03/04/2024, 9:50 AM   Clinical Narrative:    Pt set up for OPPT at [facility]. Pt reports she has RW at home. No further TOC needs at this time.   Final next level of care: Home/Self Care Barriers to Discharge: Continued Medical Work up   Patient Goals and CMS Choice Patient states their goals for this hospitalization and ongoing recovery are:: To return home        Discharge Placement  Home              Patient to be transferred to facility by: Family Name of family member notified: Patient Patient and family notified of of transfer: 03/04/24  Discharge Plan and Services Additional resources added to the After Visit Summary for  Follow Up                DME Arranged: N/A DME Agency: NA       HH Arranged: NA HH Agency: NA        Social Drivers of Health (SDOH) Interventions SDOH Screenings   Food Insecurity: No Food Insecurity (03/01/2024)  Housing: Low Risk  (03/01/2024)  Transportation Needs: No Transportation Needs (03/01/2024)  Utilities: Not At Risk (03/01/2024)  Financial Resource Strain: Low Risk  (08/07/2023)   Received from [facility]  Social Connections: Unknown (10/05/2021)   Received from [facility]  Tobacco Use: High Risk (03/01/2024)     Readmission Risk Interventions    03/04/2024    9:49 AM  Readmission Risk Prevention Plan  Post Dischage Appt Complete  Medication Screening Complete  Transportation Screening Complete    Signed: Heather Saltness, MSW, LCSW Clinical Social Worker Inpatient Care Management 03/04/2024 9:51 AM

## 2024-04-04 ENCOUNTER — Encounter: Payer: Self-pay | Admitting: Physician Assistant

## 2024-04-04 ENCOUNTER — Ambulatory Visit: Admitting: Physician Assistant

## 2024-04-04 DIAGNOSIS — R000 Tachycardia, unspecified: Secondary | ICD-10-CM | POA: Insufficient documentation

## 2024-04-04 DIAGNOSIS — R112 Nausea with vomiting, unspecified: Secondary | ICD-10-CM | POA: Insufficient documentation

## 2024-04-04 DIAGNOSIS — R8290 Unspecified abnormal findings in urine: Secondary | ICD-10-CM | POA: Insufficient documentation

## 2024-04-04 DIAGNOSIS — N96 Recurrent pregnancy loss: Secondary | ICD-10-CM | POA: Insufficient documentation

## 2024-04-04 DIAGNOSIS — O99333 Smoking (tobacco) complicating pregnancy, third trimester: Secondary | ICD-10-CM | POA: Insufficient documentation

## 2024-05-30 ENCOUNTER — Ambulatory Visit: Admitting: Family
